# Patient Record
Sex: Female | Born: 1950 | Race: White | Hispanic: No | Smoking: Never smoker
Health system: Southern US, Community
[De-identification: ages and names within clinical notes are randomized; demographics above are authoritative.]

## PROBLEM LIST (undated history)

## (undated) DIAGNOSIS — Z923 Personal history of irradiation: Secondary | ICD-10-CM

## (undated) DIAGNOSIS — E119 Type 2 diabetes mellitus without complications: Secondary | ICD-10-CM

## (undated) DIAGNOSIS — Z9981 Dependence on supplemental oxygen: Secondary | ICD-10-CM

## (undated) DIAGNOSIS — K219 Gastro-esophageal reflux disease without esophagitis: Secondary | ICD-10-CM

## (undated) DIAGNOSIS — Z8669 Personal history of other diseases of the nervous system and sense organs: Secondary | ICD-10-CM

## (undated) DIAGNOSIS — I071 Rheumatic tricuspid insufficiency: Secondary | ICD-10-CM

## (undated) DIAGNOSIS — R296 Repeated falls: Secondary | ICD-10-CM

## (undated) DIAGNOSIS — G894 Chronic pain syndrome: Secondary | ICD-10-CM

## (undated) DIAGNOSIS — M5136 Other intervertebral disc degeneration, lumbar region: Secondary | ICD-10-CM

## (undated) DIAGNOSIS — D126 Benign neoplasm of colon, unspecified: Secondary | ICD-10-CM

## (undated) DIAGNOSIS — M51369 Other intervertebral disc degeneration, lumbar region without mention of lumbar back pain or lower extremity pain: Secondary | ICD-10-CM

## (undated) DIAGNOSIS — I350 Nonrheumatic aortic (valve) stenosis: Secondary | ICD-10-CM

## (undated) DIAGNOSIS — E1343 Other specified diabetes mellitus with diabetic autonomic (poly)neuropathy: Secondary | ICD-10-CM

## (undated) DIAGNOSIS — E785 Hyperlipidemia, unspecified: Secondary | ICD-10-CM

## (undated) DIAGNOSIS — D649 Anemia, unspecified: Secondary | ICD-10-CM

## (undated) DIAGNOSIS — R600 Localized edema: Secondary | ICD-10-CM

## (undated) DIAGNOSIS — F1190 Opioid use, unspecified, uncomplicated: Secondary | ICD-10-CM

## (undated) DIAGNOSIS — I10 Essential (primary) hypertension: Secondary | ICD-10-CM

## (undated) DIAGNOSIS — I209 Angina pectoris, unspecified: Secondary | ICD-10-CM

## (undated) DIAGNOSIS — G4730 Sleep apnea, unspecified: Secondary | ICD-10-CM

## (undated) DIAGNOSIS — G4733 Obstructive sleep apnea (adult) (pediatric): Secondary | ICD-10-CM

## (undated) DIAGNOSIS — M1990 Unspecified osteoarthritis, unspecified site: Secondary | ICD-10-CM

## (undated) DIAGNOSIS — R011 Cardiac murmur, unspecified: Secondary | ICD-10-CM

## (undated) DIAGNOSIS — I340 Nonrheumatic mitral (valve) insufficiency: Secondary | ICD-10-CM

## (undated) HISTORY — PX: CHOLECYSTECTOMY: SHX55

## (undated) HISTORY — PX: ABDOMINAL HYSTERECTOMY: SHX81

## (undated) HISTORY — PX: BREAST BIOPSY: SHX20

## (undated) HISTORY — PX: EYE SURGERY: SHX253

## (undated) HISTORY — PX: TONSILLECTOMY: SUR1361

## (undated) HISTORY — PX: FUNCTIONAL ENDOSCOPIC SINUS SURGERY: SUR616

---

## 1989-12-22 HISTORY — PX: ABDOMINAL HYSTERECTOMY: SHX81

## 2005-11-19 ENCOUNTER — Ambulatory Visit: Payer: Self-pay

## 2007-01-27 ENCOUNTER — Ambulatory Visit: Payer: Self-pay

## 2008-03-04 DIAGNOSIS — E1165 Type 2 diabetes mellitus with hyperglycemia: Secondary | ICD-10-CM | POA: Insufficient documentation

## 2008-03-04 DIAGNOSIS — I10 Essential (primary) hypertension: Secondary | ICD-10-CM | POA: Insufficient documentation

## 2008-05-24 ENCOUNTER — Ambulatory Visit: Payer: Self-pay

## 2008-12-11 ENCOUNTER — Ambulatory Visit: Payer: Self-pay

## 2010-11-12 ENCOUNTER — Ambulatory Visit: Payer: Self-pay | Admitting: Family Medicine

## 2013-02-15 ENCOUNTER — Ambulatory Visit: Payer: Self-pay

## 2016-05-20 ENCOUNTER — Other Ambulatory Visit: Payer: Self-pay | Admitting: Family Medicine

## 2016-05-20 DIAGNOSIS — Z780 Asymptomatic menopausal state: Secondary | ICD-10-CM

## 2016-05-23 ENCOUNTER — Other Ambulatory Visit: Payer: Self-pay | Admitting: Family Medicine

## 2016-05-23 DIAGNOSIS — Z1231 Encounter for screening mammogram for malignant neoplasm of breast: Secondary | ICD-10-CM

## 2016-06-09 DIAGNOSIS — R011 Cardiac murmur, unspecified: Secondary | ICD-10-CM | POA: Insufficient documentation

## 2016-06-26 ENCOUNTER — Ambulatory Visit
Admission: RE | Admit: 2016-06-26 | Discharge: 2016-06-26 | Disposition: A | Discharge status: Home / Self Care | Payer: Medicare HMO | Source: Ambulatory Visit | Attending: Family Medicine | Admitting: Family Medicine

## 2016-06-26 ENCOUNTER — Other Ambulatory Visit: Payer: Self-pay | Admitting: Family Medicine

## 2016-06-26 DIAGNOSIS — Z1321 Encounter for screening for nutritional disorder: Secondary | ICD-10-CM | POA: Insufficient documentation

## 2016-06-26 DIAGNOSIS — Z1231 Encounter for screening mammogram for malignant neoplasm of breast: Secondary | ICD-10-CM

## 2016-06-26 DIAGNOSIS — M8580 Other specified disorders of bone density and structure, unspecified site: Secondary | ICD-10-CM | POA: Diagnosis not present

## 2016-06-26 DIAGNOSIS — Z13820 Encounter for screening for osteoporosis: Secondary | ICD-10-CM | POA: Insufficient documentation

## 2016-06-26 DIAGNOSIS — Z780 Asymptomatic menopausal state: Secondary | ICD-10-CM

## 2016-06-27 ENCOUNTER — Other Ambulatory Visit: Payer: Self-pay | Admitting: Family Medicine

## 2016-06-27 DIAGNOSIS — N6310 Unspecified lump in the right breast, unspecified quadrant: Secondary | ICD-10-CM

## 2016-07-10 ENCOUNTER — Ambulatory Visit
Admission: RE | Admit: 2016-07-10 | Discharge: 2016-07-10 | Disposition: A | Discharge status: Home / Self Care | Payer: Medicare HMO | Source: Ambulatory Visit | Attending: Family Medicine | Admitting: Family Medicine

## 2016-07-10 DIAGNOSIS — N6310 Unspecified lump in the right breast, unspecified quadrant: Secondary | ICD-10-CM

## 2016-07-10 DIAGNOSIS — N63 Unspecified lump in breast: Secondary | ICD-10-CM | POA: Insufficient documentation

## 2016-07-11 ENCOUNTER — Other Ambulatory Visit: Payer: Self-pay | Admitting: Family Medicine

## 2016-07-11 DIAGNOSIS — N6310 Unspecified lump in the right breast, unspecified quadrant: Secondary | ICD-10-CM

## 2016-07-16 ENCOUNTER — Ambulatory Visit
Admission: RE | Admit: 2016-07-16 | Discharge: 2016-07-16 | Disposition: A | Discharge status: Home / Self Care | Payer: Medicare HMO | Source: Ambulatory Visit | Attending: Family Medicine | Admitting: Family Medicine

## 2016-07-16 DIAGNOSIS — N6001 Solitary cyst of right breast: Secondary | ICD-10-CM | POA: Diagnosis not present

## 2016-07-16 DIAGNOSIS — N6310 Unspecified lump in the right breast, unspecified quadrant: Secondary | ICD-10-CM

## 2016-07-16 DIAGNOSIS — N63 Unspecified lump in breast: Secondary | ICD-10-CM | POA: Diagnosis present

## 2016-07-16 HISTORY — PX: BREAST CYST ASPIRATION: SHX578

## 2016-07-29 ENCOUNTER — Encounter: Payer: Self-pay | Admitting: *Deleted

## 2016-07-30 ENCOUNTER — Encounter
Admission: RE | Disposition: A | Discharge status: Home / Self Care | Payer: Self-pay | Source: Ambulatory Visit | Attending: Gastroenterology

## 2016-07-30 ENCOUNTER — Ambulatory Visit
Admission: RE | Admit: 2016-07-30 | Discharge: 2016-07-30 | Disposition: A | Discharge status: Home / Self Care | Payer: Medicare HMO | Source: Ambulatory Visit | Attending: Gastroenterology | Admitting: Gastroenterology

## 2016-07-30 ENCOUNTER — Ambulatory Visit: Payer: Medicare HMO | Admitting: Anesthesiology

## 2016-07-30 ENCOUNTER — Encounter: Payer: Self-pay | Admitting: *Deleted

## 2016-07-30 DIAGNOSIS — Z800 Family history of malignant neoplasm of digestive organs: Secondary | ICD-10-CM | POA: Diagnosis not present

## 2016-07-30 DIAGNOSIS — E785 Hyperlipidemia, unspecified: Secondary | ICD-10-CM | POA: Insufficient documentation

## 2016-07-30 DIAGNOSIS — I10 Essential (primary) hypertension: Secondary | ICD-10-CM | POA: Insufficient documentation

## 2016-07-30 DIAGNOSIS — Z1211 Encounter for screening for malignant neoplasm of colon: Secondary | ICD-10-CM | POA: Insufficient documentation

## 2016-07-30 DIAGNOSIS — E119 Type 2 diabetes mellitus without complications: Secondary | ICD-10-CM | POA: Diagnosis not present

## 2016-07-30 DIAGNOSIS — M1990 Unspecified osteoarthritis, unspecified site: Secondary | ICD-10-CM | POA: Diagnosis not present

## 2016-07-30 DIAGNOSIS — G4730 Sleep apnea, unspecified: Secondary | ICD-10-CM | POA: Insufficient documentation

## 2016-07-30 DIAGNOSIS — K5730 Diverticulosis of large intestine without perforation or abscess without bleeding: Secondary | ICD-10-CM | POA: Diagnosis not present

## 2016-07-30 DIAGNOSIS — Z7984 Long term (current) use of oral hypoglycemic drugs: Secondary | ICD-10-CM | POA: Insufficient documentation

## 2016-07-30 DIAGNOSIS — K219 Gastro-esophageal reflux disease without esophagitis: Secondary | ICD-10-CM | POA: Diagnosis not present

## 2016-07-30 DIAGNOSIS — Z862 Personal history of diseases of the blood and blood-forming organs and certain disorders involving the immune mechanism: Secondary | ICD-10-CM | POA: Diagnosis not present

## 2016-07-30 DIAGNOSIS — D128 Benign neoplasm of rectum: Secondary | ICD-10-CM | POA: Diagnosis not present

## 2016-07-30 DIAGNOSIS — Z79899 Other long term (current) drug therapy: Secondary | ICD-10-CM | POA: Diagnosis not present

## 2016-07-30 HISTORY — DX: Essential (primary) hypertension: I10

## 2016-07-30 HISTORY — DX: Localized edema: R60.0

## 2016-07-30 HISTORY — DX: Gastro-esophageal reflux disease without esophagitis: K21.9

## 2016-07-30 HISTORY — PX: COLONOSCOPY WITH PROPOFOL: SHX5780

## 2016-07-30 HISTORY — DX: Type 2 diabetes mellitus without complications: E11.9

## 2016-07-30 HISTORY — DX: Sleep apnea, unspecified: G47.30

## 2016-07-30 HISTORY — DX: Unspecified osteoarthritis, unspecified site: M19.90

## 2016-07-30 HISTORY — DX: Angina pectoris, unspecified: I20.9

## 2016-07-30 HISTORY — DX: Hyperlipidemia, unspecified: E78.5

## 2016-07-30 HISTORY — DX: Anemia, unspecified: D64.9

## 2016-07-30 LAB — GLUCOSE, CAPILLARY: GLUCOSE-CAPILLARY: 163 mg/dL — AB (ref 65–99)

## 2016-07-30 SURGERY — COLONOSCOPY WITH PROPOFOL
Anesthesia: General

## 2016-07-30 MED ORDER — MIDAZOLAM HCL 2 MG/2ML IJ SOLN
INTRAMUSCULAR | Status: DC | PRN
  Administered 2016-07-30: 4 mg via INTRAVENOUS
  Administered 2016-07-30: 1 mg via INTRAVENOUS

## 2016-07-30 MED ORDER — PROPOFOL 10 MG/ML IV BOLUS
INTRAVENOUS | Status: DC | PRN
  Administered 2016-07-30: 30 mg via INTRAVENOUS

## 2016-07-30 MED ORDER — SODIUM CHLORIDE 0.9 % IV SOLN
INTRAVENOUS | Status: DC
  Administered 2016-07-30: 08:00:00 via INTRAVENOUS

## 2016-07-30 MED ORDER — SODIUM CHLORIDE 0.9 % IV SOLN: INTRAVENOUS | Status: DC

## 2016-07-30 MED ORDER — PROPOFOL 500 MG/50ML IV EMUL
INTRAVENOUS | Status: DC | PRN
  Administered 2016-07-30: 120 ug/kg/min via INTRAVENOUS

## 2016-07-30 MED ORDER — PHENYLEPHRINE HCL 10 MG/ML IJ SOLN
INTRAMUSCULAR | Status: DC | PRN
  Administered 2016-07-30: 100 ug via INTRAVENOUS

## 2016-07-30 MED ORDER — FENTANYL CITRATE (PF) 100 MCG/2ML IJ SOLN
INTRAMUSCULAR | Status: DC | PRN
  Administered 2016-07-30 (×2): 25 ug via INTRAVENOUS

## 2016-07-30 NOTE — Anesthesia Postprocedure Evaluation (Signed)
Anesthesia Post Note  Patient: Rhonda Thornton  Procedure(s) Performed: Procedure(s) (LRB): COLONOSCOPY WITH PROPOFOL (N/A)  Patient location during evaluation: PACU Anesthesia Type: General Level of consciousness: awake Pain management: pain level controlled Vital Signs Assessment: post-procedure vital signs reviewed and stable Respiratory status: nonlabored ventilation Cardiovascular status: stable Anesthetic complications: no    Last Vitals:  Vitals:   07/30/16 0749 07/30/16 0923  BP: 138/89 112/70  Pulse: 94 80  Resp: 16 (!) 22  Temp: 37.5 C 36.3 C    Last Pain:  Vitals:   07/30/16 0923  TempSrc: Tympanic                 VAN STAVEREN,Letina Luckett

## 2016-07-30 NOTE — Anesthesia Preprocedure Evaluation (Signed)
Anesthesia Evaluation  Patient identified by MRN, date of birth, ID band Patient awake    Reviewed: Allergy & Precautions, NPO status , Patient's Chart, lab work & pertinent test results  Airway Mallampati: III       Dental  (+) Missing   Pulmonary sleep apnea ,    breath sounds clear to auscultation       Cardiovascular hypertension, Pt. on medications + angina  Rhythm:Regular     Neuro/Psych    GI/Hepatic GERD  Medicated,  Endo/Other  diabetes, Type 2, Oral Hypoglycemic AgentsMorbid obesity  Renal/GU      Musculoskeletal   Abdominal (+) + obese,   Peds  Hematology  (+) anemia ,   Anesthesia Other Findings   Reproductive/Obstetrics                             Anesthesia Physical Anesthesia Plan  ASA: III  Anesthesia Plan: General   Post-op Pain Management:    Induction: Intravenous  Airway Management Planned: Natural Airway and Nasal Cannula  Additional Equipment:   Intra-op Plan:   Post-operative Plan:   Informed Consent: I have reviewed the patients History and Physical, chart, labs and discussed the procedure including the risks, benefits and alternatives for the proposed anesthesia with the patient or authorized representative who has indicated his/her understanding and acceptance.     Plan Discussed with:   Anesthesia Plan Comments:         Anesthesia Quick Evaluation

## 2016-07-30 NOTE — Transfer of Care (Signed)
Immediate Anesthesia Transfer of Care Note  Patient: Rhonda Thornton  Procedure(s) Performed: Procedure(s): COLONOSCOPY WITH PROPOFOL (N/A)  Patient Location: PACU  Anesthesia Type:General  Level of Consciousness: awake and alert   Airway & Oxygen Therapy: Patient Spontanous Breathing and Patient connected to nasal cannula oxygen  Post-op Assessment: Report given to RN and Post -op Vital signs reviewed and stable  Post vital signs: Reviewed  Last Vitals:  Vitals:   07/30/16 0749  BP: 138/89  Pulse: 94  Resp: 16  Temp: 37.5 C    Last Pain:  Vitals:   07/30/16 0749  TempSrc: Oral         Complications: No apparent anesthesia complications

## 2016-07-30 NOTE — Op Note (Signed)
[facility] Gastroenterology Patient Name: Rhonda Thornton Procedure Date: 07/30/2016 8:39 AM MRN: [id number] Account #: [id number] Date of Birth: 14-Apr-1951 Admit Type: Outpatient Age: 65 Room: [facility] ENDO ROOM 1 Gender: Female Note Status: Finalized Procedure:            Colonoscopy Indications:          Colon cancer screening in patient at increased risk:                        Colorectal cancer in brother Providers:            Lollie Sails, MD Referring MD:         Denton Lank MD, MD (Referring MD) Medicines:            Monitored Anesthesia Care Complications:        No immediate complications. Procedure:            Pre-Anesthesia Assessment:                       - ASA Grade Assessment: III - A patient with severe                        systemic disease.                       After obtaining informed consent, the colonoscope was                        passed under direct vision. Throughout the procedure,                        the patient's blood pressure, pulse, and oxygen                        saturations were monitored continuously. The                        Colonoscope was introduced through the anus and                        advanced to the the cecum, identified by appendiceal                        orifice and ileocecal valve. The colonoscopy was                        performed with moderate difficulty due to poor bowel                        prep. Successful completion of the procedure was aided                        by lavage. Findings:      A 2 mm polyp was found in the rectum. The polyp was flat. The polyp was       removed with a cold biopsy forceps. Resection and retrieval were       complete.      Multiple medium-mouthed diverticula were found in the sigmoid colon and       descending colon.      The digital rectal exam was normal.  The retroflexed view of the distal rectum and anal verge was normal and       showed no anal or rectal  abnormalities. Impression:           - One 2 mm polyp in the rectum, removed with a cold                        biopsy forceps. Resected and retrieved.                       - Diverticulosis in the sigmoid colon and in the                        descending colon.                       - The distal rectum and anal verge are normal on                        retroflexion view. Recommendation:       - Discharge patient to home.                       - Resume regular diet.                       - Telephone GI clinic for pathology results in 1 week. Procedure Code(s):    --- Professional ---                       [phone], Colonoscopy, flexible; with biopsy, single or                        multiple Diagnosis Code(s):    --- Professional ---                       Z80.0, Family history of malignant neoplasm of                        digestive organs                       K62.1, Rectal polyp                       K57.30, Diverticulosis of large intestine without                        perforation or abscess without bleeding CPT copyright 2016 [employer] Association. All rights reserved. The codes documented in this report are preliminary and upon coder review may  be revised to meet current compliance requirements. Lollie Sails, MD 07/30/2016 9:23:04 AM This report has been signed electronically. Number of Addenda: 0 Note Initiated On: 07/30/2016 8:39 AM Scope Withdrawal Time: 0 hours 8 minutes 53 seconds  Total Procedure Duration: 0 hours 20 minutes 56 seconds       [facility]

## 2016-07-30 NOTE — H&P (Signed)
Outpatient short stay form Pre-procedure 07/30/2016 8:48 AM Lollie Sails MD  Primary Physician: Dr. Veda Canning  Reason for visit:  Screening colonoscopy  History of present illness:  Patient is a 65 year old female presenting today as above. She has a family history of colon cancer in primary relatives this being a high risk procedure screening for her. Patient reports being on 2 325 mg aspirin daily. She last took this about 5 days ago. She could not tell me why she was on that high dose of aspirin and I have requested that she discuss this further with her primary doctor. She tolerated her prep well.    Current Facility-Administered Medications:  .  0.9 %  sodium chloride infusion, , Intravenous, Continuous, Manya Silvas, MD, Last Rate: 20 mL/hr at 07/30/16 0809 .  0.9 %  sodium chloride infusion, , Intravenous, Continuous, Lollie Sails, MD  Prescriptions Prior to Admission  Medication Sig Dispense Refill Last Dose  . alum & mag hydroxide-simeth (MAALOX/MYLANTA) 200-200-20 MG/5ML suspension Take 15 mLs by mouth as needed for indigestion or heartburn.     Marland Kitchen amLODipine (NORVASC) 5 MG tablet Take 5 mg by mouth daily.   07/29/2016 at Unknown time  . aspirin 325 MG EC tablet Take 325 mg by mouth 2 (two) times daily.   07/25/2016  . atorvastatin (LIPITOR) 80 MG tablet Take 80 mg by mouth daily.     . beclomethasone (BECONASE-AQ) 42 MCG/SPRAY nasal spray Place 2 sprays into both nostrils 2 (two) times daily. Dose is for each nostril.   07/30/2016 at 0600  . calcium carbonate (TUMS - DOSED IN MG ELEMENTAL CALCIUM) 500 MG chewable tablet Chew 1 tablet by mouth as needed for indigestion or heartburn.   Past Week at Unknown time  . cetirizine (ZYRTEC) 10 MG tablet Take 10 mg by mouth daily.   07/29/2016 at Unknown time  . cyclobenzaprine (FLEXERIL) 10 MG tablet Take 10 mg by mouth 3 (three) times daily as needed for muscle spasms.   07/29/2016 at Unknown time  . glipiZIDE (GLUCOTROL) 5 MG tablet  Take by mouth daily before breakfast.     . HYDROcodone-acetaminophen (NORCO) 7.5-325 MG tablet Take 1 tablet by mouth 2 (two) times daily.   07/29/2016 at Unknown time  . ibuprofen (ADVIL,MOTRIN) 200 MG tablet Take 200 mg by mouth every 8 (eight) hours as needed.   Past Week at Unknown time  . lisinopril (PRINIVIL,ZESTRIL) 5 MG tablet Take 5 mg by mouth daily.   07/29/2016 at Unknown time  . metFORMIN (GLUCOPHAGE) 500 MG tablet Take 500 mg by mouth 4 (four) times daily.     . metoCLOPramide (REGLAN) 10 MG tablet Take 10 mg by mouth 3 (three) times daily before meals.   07/29/2016 at Unknown time  . omeprazole (PRILOSEC) 40 MG capsule Take 40 mg by mouth daily.   07/29/2016 at Unknown time  . phenytoin (DILANTIN) 100 MG ER capsule Take 300 mg by mouth 3 (three) times daily.   07/29/2016 at Unknown time  . pregabalin (LYRICA) 100 MG capsule Take 100 mg by mouth 3 (three) times daily.   07/29/2016 at Unknown time  . sertraline (ZOLOFT) 100 MG tablet Take 100 mg by mouth daily. Take 1 1/2 tablet a day   07/29/2016 at Unknown time     No Known Allergies   Past Medical History:  Diagnosis Date  . Anemia   . Anginal pain ([place])   . Arthritis   . Diabetes mellitus without complication (  [place])   . Edema of left lower extremity   . GERD (gastroesophageal reflux disease)   . Hyperlipidemia   . Hypertension   . Sleep apnea     Review of systems:      Physical Exam    Heart and lungs: Regular rate and rhythm without rub or gallop, lungs are bilaterally clear.    HEENT: Normocephalic atraumatic eyes are anicteric    Other:     Pertinant exam for procedure: Soft nontender nondistended bowel sounds positive normoactive.    Planned proceedures: Colonoscopy and indicated procedures. I have discussed the risks benefits and complications of procedures to include not limited to bleeding, infection, perforation and the risk of sedation and the patient wishes to proceed.    Lollie Sails,  MD Gastroenterology 07/30/2016  8:48 AM

## 2016-07-31 ENCOUNTER — Encounter: Payer: Self-pay | Admitting: Gastroenterology

## 2016-07-31 LAB — SURGICAL PATHOLOGY

## 2017-08-12 ENCOUNTER — Other Ambulatory Visit: Payer: Self-pay | Admitting: Family Medicine

## 2017-08-12 DIAGNOSIS — K219 Gastro-esophageal reflux disease without esophagitis: Secondary | ICD-10-CM

## 2017-08-21 ENCOUNTER — Ambulatory Visit
Admission: RE | Admit: 2017-08-21 | Discharge: 2017-08-21 | Disposition: A | Discharge status: Home / Self Care | Payer: Medicare HMO | Source: Ambulatory Visit | Attending: Family Medicine | Admitting: Family Medicine

## 2017-08-21 DIAGNOSIS — K219 Gastro-esophageal reflux disease without esophagitis: Secondary | ICD-10-CM | POA: Diagnosis not present

## 2017-08-23 ENCOUNTER — Emergency Department
Admission: EM | Admit: 2017-08-23 | Discharge: 2017-08-23 | Disposition: A | Discharge status: Home / Self Care | Payer: Medicare HMO | Attending: Emergency Medicine | Admitting: Emergency Medicine

## 2017-08-23 ENCOUNTER — Encounter: Payer: Self-pay | Admitting: Emergency Medicine

## 2017-08-23 ENCOUNTER — Emergency Department: Payer: Medicare HMO

## 2017-08-23 DIAGNOSIS — E119 Type 2 diabetes mellitus without complications: Secondary | ICD-10-CM | POA: Insufficient documentation

## 2017-08-23 DIAGNOSIS — Z7982 Long term (current) use of aspirin: Secondary | ICD-10-CM | POA: Diagnosis not present

## 2017-08-23 DIAGNOSIS — Z7984 Long term (current) use of oral hypoglycemic drugs: Secondary | ICD-10-CM | POA: Insufficient documentation

## 2017-08-23 DIAGNOSIS — I10 Essential (primary) hypertension: Secondary | ICD-10-CM | POA: Insufficient documentation

## 2017-08-23 DIAGNOSIS — Z79899 Other long term (current) drug therapy: Secondary | ICD-10-CM | POA: Insufficient documentation

## 2017-08-23 DIAGNOSIS — Z79891 Long term (current) use of opiate analgesic: Secondary | ICD-10-CM | POA: Diagnosis not present

## 2017-08-23 DIAGNOSIS — R079 Chest pain, unspecified: Secondary | ICD-10-CM | POA: Insufficient documentation

## 2017-08-23 LAB — BASIC METABOLIC PANEL
ANION GAP: 8 (ref 5–15)
BUN: 18 mg/dL (ref 6–20)
CALCIUM: 9.2 mg/dL (ref 8.9–10.3)
CO2: 27 mmol/L (ref 22–32)
CREATININE: 0.54 mg/dL (ref 0.44–1.00)
Chloride: 102 mmol/L (ref 101–111)
Glucose, Bld: 162 mg/dL — ABNORMAL HIGH (ref 65–99)
Potassium: 3.7 mmol/L (ref 3.5–5.1)
SODIUM: 137 mmol/L (ref 135–145)

## 2017-08-23 LAB — HEPATIC FUNCTION PANEL
ALBUMIN: 4.2 g/dL (ref 3.5–5.0)
ALT: 19 U/L (ref 14–54)
AST: 23 U/L (ref 15–41)
Alkaline Phosphatase: 58 U/L (ref 38–126)
BILIRUBIN TOTAL: 0.5 mg/dL (ref 0.3–1.2)
Bilirubin, Direct: <0.1 mg/dL — ABNORMAL LOW (ref 0.1–0.5)
Total Protein: 7.4 g/dL (ref 6.5–8.1)

## 2017-08-23 LAB — CBC
HCT: 34.1 % — ABNORMAL LOW (ref 35.0–47.0)
HEMOGLOBIN: 11.6 g/dL — AB (ref 12.0–16.0)
MCH: 29.3 pg (ref 26.0–34.0)
MCHC: 34.1 g/dL (ref 32.0–36.0)
MCV: 86.1 fL (ref 80.0–100.0)
Platelets: 200 10*3/uL (ref 150–440)
RBC: 3.96 MIL/uL (ref 3.80–5.20)
RDW: 15.8 % — ABNORMAL HIGH (ref 11.5–14.5)
WBC: 6.5 10*3/uL (ref 3.6–11.0)

## 2017-08-23 LAB — TROPONIN I

## 2017-08-23 LAB — LIPASE, BLOOD: Lipase: 18 U/L (ref 11–51)

## 2017-08-23 MED ORDER — PANTOPRAZOLE SODIUM 40 MG PO TBEC: 40.0000 mg | DELAYED_RELEASE_TABLET | Freq: Every day | ORAL | 1 refills | Status: DC

## 2017-08-23 MED ORDER — GI COCKTAIL ~~LOC~~
30.0000 mL | Freq: Once | ORAL | Status: AC
  Administered 2017-08-23: 30 mL via ORAL
  Filled 2017-08-23: qty 30

## 2017-08-23 MED ORDER — IOPAMIDOL (ISOVUE-370) INJECTION 76%
75.0000 mL | Freq: Once | INTRAVENOUS | Status: AC | PRN
  Administered 2017-08-23: 75 mL via INTRAVENOUS
  Filled 2017-08-23: qty 75

## 2017-08-23 NOTE — Discharge Instructions (Signed)
You have been seen in the emergency department today for chest pain. Your workup has shown largely normal results. As we discussed please follow-up with your primary care physician in the next 1-2 days for recheck. Please call the numbers provided for GI and cardiology follow up as well as soon as possible.  Return to the emergency department for any further chest pain, trouble breathing, or any other symptom personally concerning to yourself.

## 2017-08-23 NOTE — ED Notes (Signed)
FIRST NURSE NOTE:  Chest pain for the past week. Pt came from [facility], states worsening chest pain since last night.

## 2017-08-23 NOTE — ED Provider Notes (Addendum)
[facility] Emergency Department Provider Note  Time seen: 2:30 PM  I have reviewed the triage vital signs and the nursing notes.   HISTORY  Chief Complaint Chest Pain    HPI Rhonda Thornton is a 66 y.o. female With a past medical history of arthritis, diabetes, gastric reflux, hypertension, hyperlipidemia who presents to the emergency department with chest discomfort. According to the patient for the past several weeks but worse over the past one week she has been expressing chest pressure/indigestion sensation after eating. She states the pain typically lasts up to several hours and then goes away. Over the past 2 days she states the pain is been fairly constant throughout the day. She also states mild shortness of breath which is slightly increased compared to normal. Denies any fever. Denies any cough or congestion. Patient states she has noticed mild leg swelling bilaterally but states this is somewhat chronic for her as well. Patient does were oxygen but states only at nighttime. Patient states mild chest discomfort currently in the center of her chest. Patient states she has been taking over-the-counter indigestion medications without relief so she came to the emergency department.  Past Medical History:  Diagnosis Date  . Anemia   . Anginal pain ([place])   . Arthritis   . Diabetes mellitus without complication ([place])   . Edema of left lower extremity   . GERD (gastroesophageal reflux disease)   . Hyperlipidemia   . Hypertension   . Sleep apnea     There are no active problems to display for this patient.   Past Surgical History:  Procedure Laterality Date  . ABDOMINAL HYSTERECTOMY    . CHOLECYSTECTOMY    . COLONOSCOPY WITH PROPOFOL N/A 07/30/2016   Procedure: COLONOSCOPY WITH PROPOFOL;  Surgeon: Lollie Sails, MD;  Location: [facility] ENDOSCOPY;  Service: Endoscopy;  Laterality: N/A;  . FUNCTIONAL ENDOSCOPIC SINUS SURGERY    . TONSILLECTOMY      Prior  to Admission medications   Medication Sig Start Date End Date Taking? Authorizing Provider  alum & mag hydroxide-simeth (MAALOX/MYLANTA) 200-200-20 MG/5ML suspension Take 15 mLs by mouth as needed for indigestion or heartburn.    [provider]  amLODipine (NORVASC) 5 MG tablet Take 5 mg by mouth daily.    [provider]  aspirin 325 MG EC tablet Take 325 mg by mouth 2 (two) times daily.    [provider]  atorvastatin (LIPITOR) 80 MG tablet Take 80 mg by mouth daily.    [provider]  beclomethasone (BECONASE-AQ) 42 MCG/SPRAY nasal spray Place 2 sprays into both nostrils 2 (two) times daily. Dose is for each nostril.    [provider]  calcium carbonate (TUMS - DOSED IN MG ELEMENTAL CALCIUM) 500 MG chewable tablet Chew 1 tablet by mouth as needed for indigestion or heartburn.    [provider]  cetirizine (ZYRTEC) 10 MG tablet Take 10 mg by mouth daily.    [provider]  cyclobenzaprine (FLEXERIL) 10 MG tablet Take 10 mg by mouth 3 (three) times daily as needed for muscle spasms.    [provider]  glipiZIDE (GLUCOTROL) 5 MG tablet Take by mouth daily before breakfast.    [provider]  HYDROcodone-acetaminophen (NORCO) 7.5-325 MG tablet Take 1 tablet by mouth 2 (two) times daily.    [provider]  ibuprofen (ADVIL,MOTRIN) 200 MG tablet Take 200 mg by mouth every 8 (eight) hours as needed.    [provider]  lisinopril (PRINIVIL,ZESTRIL) 5 MG tablet Take 5 mg by mouth daily.    [provider]  metFORMIN (GLUCOPHAGE) 500 MG tablet Take 500 mg by mouth 4 (four) times daily.    [provider]  metoCLOPramide (REGLAN) 10 MG tablet Take 10 mg by mouth 3 (three) times daily before meals.    [provider]  omeprazole (PRILOSEC) 40 MG capsule Take 40 mg by mouth daily.    [provider]  phenytoin (DILANTIN) 100 MG ER capsule Take 300 mg by mouth 3  (three) times daily.    [provider]  pregabalin (LYRICA) 100 MG capsule Take 100 mg by mouth 3 (three) times daily.    [provider]  sertraline (ZOLOFT) 100 MG tablet Take 100 mg by mouth daily. Take 1 1/2 tablet a day    [provider]    No Known Allergies  Family History  Problem Relation Age of Onset  . Breast cancer Neg Hx     Social History Social History  Substance Use Topics  . Smoking status: Never Smoker  . Smokeless tobacco: Never Used  . Alcohol use No    Review of Systems Constitutional: Negative for fever. Cardiovascular: chest discomfort/indigestion after eating Respiratory: mild shortness of breath. Gastrointestinal: Negative for abdominal pain. Denies vomiting or diarrhea. Genitourinary: Negative for dysuria. Musculoskeletal: Negative for back pain. Neurological: Negative for headache All other ROS negative  ____________________________________________   PHYSICAL EXAM:  VITAL SIGNS: ED Triage Vitals  Enc Vitals Group     BP 08/23/17 1334 (!) 130/59     Pulse Rate 08/23/17 1334 70     Resp 08/23/17 1334 18     Temp 08/23/17 1334 99.3 F (37.4 C)     Temp Source 08/23/17 1334 Oral     SpO2 08/23/17 1334 96 %     Weight 08/23/17 1335 242 lb (109.8 kg)     Height 08/23/17 1335 5\' 4"  (1.626 m)     Head Circumference --      Peak Flow --      Pain Score 08/23/17 1333 10     Pain Loc --      Pain Edu? --      Excl. in [place]? --     Constitutional: Alert and oriented. Well appearing and in no distress. Eyes: Normal exam ENT   Head: Normocephalic and atraumatic   Mouth/Throat: Mucous membranes are moist. Cardiovascular: Normal rate, regular rhythm. No murmur Respiratory: Normal respiratory effort without tachypnea nor retractions. Breath sounds are clear Gastrointestinal: Soft and nontender. No distention. obese. Musculoskeletal: Nontender with normal range of motion in all extremities. mild lower extremity  edema, equal bilaterally. No calf tenderness. Neurologic:  Normal speech and language. No gross focal neurologic deficits Skin:  Skin is warm, dry and intact.  Psychiatric: Mood and affect are normal.  ____________________________________________    EKG  EKG reviewed and interpreted by myself shows normal sinus rhythm at 74 bpm, narrow QRS, normal axis, normal intervals and no concerning ST changes.  ____________________________________________    RADIOLOGY  IMPRESSION: 1. Mild bibasilar linear atelectasis or scarring. 2. Mild changes of COPD and chronic bronchitis.  ____________________________________________   INITIAL IMPRESSION / ASSESSMENT AND PLAN / ED COURSE  Pertinent labs & imaging results that were available during my care of the patient were reviewed by me and considered in my medical decision making (see chart for details).  patient presents to the emergency department for several weeks of chest discomfort worse over  the past 1 week. Patient states the chest discomfort only occurs after eating and believes it feels most like indigestion although it has not responded to over-the-counter medications. Patient's EKG is reassuring, chest x-ray is largely negative. Labs are normal including a negative troponin. Given the patient's description of the pain especially after eating have added on a hepatic function panel although the patient is status post cholecystectomy as well as a lipase. We will treat with a GI cocktail in the emergency department and monitor for improvement. If not improved further workup may be warranted.  patient states moderate relief after GI cocktail but continues to have some chest discomfort. Low-grade fever 99.3 with complaints of mild shortness of breath. We will obtain CT angiography of the chest to rule out pulmonary embolus.  CT scan of the chest is negative. We will discharge the patient home with PCP follow-up in my normal chest pain return  precautions.I will also have the patient follow-up with her cardiologist as well as GI medicine. Patient states he had a stress test performed less than one year ago by [facility].  I discussed dietary precautions as well as avoiding NSAIDs products.  ____________________________________________   FINAL CLINICAL IMPRESSION(S) / ED DIAGNOSES  chest pain    Harvest Dark, MD 08/23/17 1716    Harvest Dark, MD 08/23/17 1324

## 2017-08-23 NOTE — ED Notes (Signed)
Pt able to ambulate to the restroom with her walker. Baseline for pt

## 2017-08-23 NOTE — ED Triage Notes (Signed)
Pt arrives from [facility] Pt c/o chest pressure and a burning sensation that worsens after eating x 1 week. Pt also states that both legs have been swelling lately. Pt c/o shortness of breath that comes and goes. Pt states she is on oxygen at home at night d/t sleep apnea - current sats are 96% on room air.

## 2017-08-31 ENCOUNTER — Other Ambulatory Visit
Admission: RE | Admit: 2017-08-31 | Discharge: 2017-08-31 | Disposition: A | Discharge status: Home / Self Care | Payer: Medicare HMO | Source: Ambulatory Visit | Attending: Gastroenterology | Admitting: Gastroenterology

## 2017-08-31 DIAGNOSIS — R1310 Dysphagia, unspecified: Secondary | ICD-10-CM | POA: Insufficient documentation

## 2017-08-31 DIAGNOSIS — R197 Diarrhea, unspecified: Secondary | ICD-10-CM | POA: Insufficient documentation

## 2017-08-31 LAB — GASTROINTESTINAL PANEL BY PCR, STOOL (REPLACES STOOL CULTURE)

## 2017-08-31 LAB — C DIFFICILE QUICK SCREEN W PCR REFLEX
C DIFFICILE (CDIFF) INTERP: NOT DETECTED
C Diff antigen: NEGATIVE
C Diff toxin: NEGATIVE

## 2017-09-09 ENCOUNTER — Ambulatory Visit: Payer: Medicare HMO | Admitting: Anesthesiology

## 2017-09-09 ENCOUNTER — Ambulatory Visit
Admission: RE | Admit: 2017-09-09 | Discharge: 2017-09-09 | Disposition: A | Discharge status: Home / Self Care | Payer: Medicare HMO | Source: Ambulatory Visit | Attending: Internal Medicine | Admitting: Internal Medicine

## 2017-09-09 ENCOUNTER — Encounter
Admission: RE | Disposition: A | Discharge status: Home / Self Care | Payer: Self-pay | Source: Ambulatory Visit | Attending: Internal Medicine

## 2017-09-09 DIAGNOSIS — Z7984 Long term (current) use of oral hypoglycemic drugs: Secondary | ICD-10-CM | POA: Insufficient documentation

## 2017-09-09 DIAGNOSIS — K3184 Gastroparesis: Secondary | ICD-10-CM | POA: Insufficient documentation

## 2017-09-09 DIAGNOSIS — Z9889 Other specified postprocedural states: Secondary | ICD-10-CM | POA: Diagnosis not present

## 2017-09-09 DIAGNOSIS — K219 Gastro-esophageal reflux disease without esophagitis: Secondary | ICD-10-CM | POA: Diagnosis not present

## 2017-09-09 DIAGNOSIS — M1990 Unspecified osteoarthritis, unspecified site: Secondary | ICD-10-CM | POA: Diagnosis not present

## 2017-09-09 DIAGNOSIS — E1143 Type 2 diabetes mellitus with diabetic autonomic (poly)neuropathy: Secondary | ICD-10-CM | POA: Diagnosis not present

## 2017-09-09 DIAGNOSIS — Z86010 Personal history of colonic polyps: Secondary | ICD-10-CM | POA: Insufficient documentation

## 2017-09-09 DIAGNOSIS — I10 Essential (primary) hypertension: Secondary | ICD-10-CM | POA: Diagnosis not present

## 2017-09-09 DIAGNOSIS — Z538 Procedure and treatment not carried out for other reasons: Secondary | ICD-10-CM | POA: Insufficient documentation

## 2017-09-09 DIAGNOSIS — Z79899 Other long term (current) drug therapy: Secondary | ICD-10-CM | POA: Insufficient documentation

## 2017-09-09 DIAGNOSIS — E785 Hyperlipidemia, unspecified: Secondary | ICD-10-CM | POA: Insufficient documentation

## 2017-09-09 DIAGNOSIS — Z7982 Long term (current) use of aspirin: Secondary | ICD-10-CM | POA: Diagnosis not present

## 2017-09-09 DIAGNOSIS — R1314 Dysphagia, pharyngoesophageal phase: Secondary | ICD-10-CM | POA: Diagnosis present

## 2017-09-09 DIAGNOSIS — G4730 Sleep apnea, unspecified: Secondary | ICD-10-CM | POA: Insufficient documentation

## 2017-09-09 DIAGNOSIS — K222 Esophageal obstruction: Secondary | ICD-10-CM | POA: Diagnosis not present

## 2017-09-09 HISTORY — PX: ESOPHAGOGASTRODUODENOSCOPY (EGD) WITH PROPOFOL: SHX5813

## 2017-09-09 HISTORY — DX: Benign neoplasm of colon, unspecified: D12.6

## 2017-09-09 LAB — GLUCOSE, CAPILLARY: Glucose-Capillary: 145 mg/dL — ABNORMAL HIGH (ref 65–99)

## 2017-09-09 SURGERY — ESOPHAGOGASTRODUODENOSCOPY (EGD) WITH PROPOFOL
Anesthesia: General

## 2017-09-09 MED ORDER — EPHEDRINE SULFATE 50 MG/ML IJ SOLN
INTRAMUSCULAR | Status: AC
  Filled 2017-09-09: qty 1

## 2017-09-09 MED ORDER — MIDAZOLAM HCL 2 MG/2ML IJ SOLN
INTRAMUSCULAR | Status: DC | PRN
  Administered 2017-09-09: 2 mg via INTRAVENOUS

## 2017-09-09 MED ORDER — PROPOFOL 10 MG/ML IV BOLUS
INTRAVENOUS | Status: AC
  Filled 2017-09-09: qty 40

## 2017-09-09 MED ORDER — GLYCOPYRROLATE 0.2 MG/ML IJ SOLN
INTRAMUSCULAR | Status: AC
  Filled 2017-09-09: qty 2

## 2017-09-09 MED ORDER — PROPOFOL 10 MG/ML IV BOLUS
INTRAVENOUS | Status: DC | PRN
  Administered 2017-09-09: 60 mg via INTRAVENOUS

## 2017-09-09 MED ORDER — PHENYLEPHRINE HCL 10 MG/ML IJ SOLN
INTRAMUSCULAR | Status: AC
  Filled 2017-09-09: qty 1

## 2017-09-09 MED ORDER — MIDAZOLAM HCL 2 MG/2ML IJ SOLN
INTRAMUSCULAR | Status: AC
  Filled 2017-09-09: qty 2

## 2017-09-09 MED ORDER — PROPOFOL 500 MG/50ML IV EMUL
INTRAVENOUS | Status: DC | PRN
  Administered 2017-09-09: 150 ug/kg/min via INTRAVENOUS

## 2017-09-09 MED ORDER — SUCCINYLCHOLINE CHLORIDE 20 MG/ML IJ SOLN
INTRAMUSCULAR | Status: AC
  Filled 2017-09-09: qty 1

## 2017-09-09 MED ORDER — GLYCOPYRROLATE 0.2 MG/ML IJ SOLN
INTRAMUSCULAR | Status: DC | PRN
  Administered 2017-09-09: 0.2 mg via INTRAVENOUS

## 2017-09-09 MED ORDER — LIDOCAINE HCL (PF) 2 % IJ SOLN
INTRAMUSCULAR | Status: AC
  Filled 2017-09-09: qty 4

## 2017-09-09 MED ORDER — SODIUM CHLORIDE 0.9 % IV SOLN
INTRAVENOUS | Status: DC
  Administered 2017-09-09: 1000 mL via INTRAVENOUS

## 2017-09-09 NOTE — Transfer of Care (Signed)
Immediate Anesthesia Transfer of Care Note  Patient: Rhonda Thornton  Procedure(s) Performed: Procedure(s): ESOPHAGOGASTRODUODENOSCOPY (EGD) WITH PROPOFOL (N/A)  Patient Location: PACU and Endoscopy Unit  Anesthesia Type:General  Level of Consciousness: sedated  Airway & Oxygen Therapy: Patient Spontanous Breathing and Patient connected to nasal cannula oxygen  Post-op Assessment: Report given to RN and Post -op Vital signs reviewed and stable  Post vital signs: Reviewed and stable  Last Vitals:  Vitals:   09/09/17 0656 09/09/17 0805  BP: (!) 141/63 109/61  Pulse: 81 85  Resp: 20 20  Temp: (!) 36.4 C (!) 36.4 C  SpO2: 28% 36%    Complications: No apparent anesthesia complications

## 2017-09-09 NOTE — Anesthesia Post-op Follow-up Note (Signed)
Anesthesia QCDR form completed.        

## 2017-09-09 NOTE — Interval H&P Note (Signed)
History and Physical Interval Note:  09/09/2017 7:51 AM  Rhonda Thornton  has presented today for surgery, with the diagnosis of ESOPHAGEAL STENOSIS  The various methods of treatment have been discussed with the patient and family. After consideration of risks, benefits and other options for treatment, the patient has consented to  Procedure(s): ESOPHAGOGASTRODUODENOSCOPY (EGD) WITH PROPOFOL (N/A) as a surgical intervention .  The patient's history has been reviewed, patient examined, no change in status, stable for surgery.  I have reviewed the patient's chart and labs.  Questions were answered to the patient's satisfaction.     [place], [employer]

## 2017-09-09 NOTE — Anesthesia Procedure Notes (Signed)
Date/Time: 09/09/2017 7:51 AM Performed by: Doreen Salvage Pre-anesthesia Checklist: Patient identified, Emergency Drugs available, Suction available and Patient being monitored Patient Re-evaluated:Patient Re-evaluated prior to induction Oxygen Delivery Method: Nasal cannula Induction Type: IV induction Dental Injury: Teeth and Oropharynx as per pre-operative assessment  Comments: Nasal cannula with etCO2 monitoring

## 2017-09-09 NOTE — Anesthesia Preprocedure Evaluation (Signed)
Anesthesia Evaluation  Patient identified by MRN, date of birth, ID band Patient awake    Reviewed: Allergy & Precautions, H&P , NPO status , Patient's Chart, lab work & pertinent test results, reviewed documented beta blocker date and time   Airway Mallampati: II   Neck ROM: full    Dental  (+) Poor Dentition   Pulmonary neg pulmonary ROS, sleep apnea ,    Pulmonary exam normal        Cardiovascular Exercise Tolerance: Poor hypertension, Pt. on medications + angina with exertion negative cardio ROS Normal cardiovascular exam Rhythm:regular Rate:Normal     Neuro/Psych negative neurological ROS  negative psych ROS   GI/Hepatic negative GI ROS, Neg liver ROS, GERD  Medicated,  Endo/Other  negative endocrine ROSdiabetes, Poorly Controlled, Type 2, Oral Hypoglycemic Agents  Renal/GU negative Renal ROS  negative genitourinary   Musculoskeletal  (+) Arthritis ,   Abdominal   Peds  Hematology negative hematology ROS (+) anemia ,   Anesthesia Other Findings Past Medical History: No date: Anemia No date: Anginal pain ([facility]) No date: Arthritis No date: Diabetes mellitus without complication ([facility]) No date: Edema of left lower extremity No date: GERD (gastroesophageal reflux disease) No date: Hyperlipidemia No date: Hypertension No date: Sleep apnea No date: Tubular adenoma of colon Past Surgical History: No date: ABDOMINAL HYSTERECTOMY No date: CHOLECYSTECTOMY 07/30/2016: COLONOSCOPY WITH PROPOFOL; N/A     Comment:  Procedure: COLONOSCOPY WITH PROPOFOL;  Surgeon: Lollie Sails, MD;  Location: [facility] ENDOSCOPY;  Service:               Endoscopy;  Laterality: N/A; No date: EYE SURGERY No date: FUNCTIONAL ENDOSCOPIC SINUS SURGERY No date: TONSILLECTOMY BMI    Body Mass Index:  39.44 kg/m     Reproductive/Obstetrics negative OB ROS                             Anesthesia  Physical Anesthesia Plan  ASA: III  Anesthesia Plan: General   Post-op Pain Management:    Induction:   PONV Risk Score and Plan: 3 and Ondansetron, Dexamethasone, Midazolam and Propofol infusion  Airway Management Planned:   Additional Equipment:   Intra-op Plan:   Post-operative Plan:   Informed Consent: I have reviewed the patients History and Physical, chart, labs and discussed the procedure including the risks, benefits and alternatives for the proposed anesthesia with the patient or authorized representative who has indicated his/her understanding and acceptance.   Dental Advisory Given  Plan Discussed with: CRNA  Anesthesia Plan Comments:         Anesthesia Quick Evaluation

## 2017-09-09 NOTE — H&P (Signed)
Outpatient short stay form Pre-procedure 09/09/2017 7:45 AM Candice Tobey K. Alice Reichert, M.D.  Primary Physician: N/A  Reason for visit:  Dysphagia  History of present illness:  66 y/o female presents for intermittent esophageal dysphagia. No nausea, vomiting or hemetemesis. Patient had pre-op barium study showing significant distal esophageal narrowing. Patient is scheduled for EGD today.    Current Facility-Administered Medications:  .  0.9 %  sodium chloride infusion, , Intravenous, Continuous, [place], Benay Pike, MD, Last Rate: 20 mL/hr at 09/09/17 0725, 1,000 mL at 09/09/17 0725  Prescriptions Prior to Admission  Medication Sig Dispense Refill Last Dose  . alum & mag hydroxide-simeth (MAALOX/MYLANTA) 200-200-20 MG/5ML suspension Take 15 mLs by mouth as needed for indigestion or heartburn.   09/08/2017 at Unknown time  . amLODipine (NORVASC) 5 MG tablet Take 5 mg by mouth daily.   09/08/2017 at Unknown time  . aspirin 325 MG EC tablet Take 325 mg by mouth 2 (two) times daily.   Past Week at Unknown time  . atorvastatin (LIPITOR) 80 MG tablet Take 80 mg by mouth daily.   09/08/2017 at Unknown time  . beclomethasone (BECONASE-AQ) 42 MCG/SPRAY nasal spray Place 2 sprays into both nostrils 2 (two) times daily. Dose is for each nostril.   09/08/2017 at Unknown time  . calcium carbonate (TUMS - DOSED IN MG ELEMENTAL CALCIUM) 500 MG chewable tablet Chew 1 tablet by mouth as needed for indigestion or heartburn.   09/08/2017 at Unknown time  . cetirizine (ZYRTEC) 10 MG tablet Take 10 mg by mouth daily.   09/08/2017 at Unknown time  . cyclobenzaprine (FLEXERIL) 10 MG tablet Take 10 mg by mouth 3 (three) times daily as needed for muscle spasms.   09/08/2017 at Unknown time  . glipiZIDE (GLUCOTROL) 5 MG tablet Take by mouth daily before breakfast.   09/08/2017 at Unknown time  . HYDROcodone-acetaminophen (NORCO) 7.5-325 MG tablet Take 1 tablet by mouth 2 (two) times daily.   09/08/2017 at Unknown time  . ibuprofen  (ADVIL,MOTRIN) 200 MG tablet Take 200 mg by mouth every 8 (eight) hours as needed.   09/08/2017 at Unknown time  . lisinopril (PRINIVIL,ZESTRIL) 10 MG tablet Take 10 mg by mouth daily.   09/08/2017 at Unknown time  . lisinopril (PRINIVIL,ZESTRIL) 5 MG tablet Take 5 mg by mouth daily.   09/08/2017 at Unknown time  . metFORMIN (GLUCOPHAGE) 500 MG tablet Take 500 mg by mouth 4 (four) times daily.   09/08/2017 at Unknown time  . metoCLOPramide (REGLAN) 10 MG tablet Take 10 mg by mouth 3 (three) times daily before meals.   09/08/2017 at Unknown time  . pantoprazole (PROTONIX) 40 MG tablet Take 1 tablet (40 mg total) by mouth daily. 30 tablet 1 09/08/2017 at Unknown time  . phenytoin (DILANTIN) 100 MG ER capsule Take 300 mg by mouth 3 (three) times daily.   09/08/2017 at Unknown time  . pregabalin (LYRICA) 100 MG capsule Take 100 mg by mouth 3 (three) times daily.   09/08/2017 at Unknown time  . sertraline (ZOLOFT) 100 MG tablet Take 100 mg by mouth daily. Take 1 1/2 tablet a day   09/08/2017 at Unknown time     No Known Allergies   Past Medical History:  Diagnosis Date  . Anemia   . Anginal pain ([place])   . Arthritis   . Diabetes mellitus without complication ([place])   . Edema of left lower extremity   . GERD (gastroesophageal reflux disease)   . Hyperlipidemia   . Hypertension   .  Sleep apnea   . Tubular adenoma of colon     Review of systems:      Physical Exam    Heart and lungs: CTA no wheezes     HEENT: RR nl S1, S2    Other: alert, oriented. Judgement normal    Pertinant exam for procedure: abdomen soft, nt, nd. No masses. BS+.   Planned proceedures: Proceed with EGD with biopsy and possible esophageal dilation. The patient understands the nature of the planned procedure, indications, risks, alternatives and potential complications including but not limited to bleeding, infection, perforation and oversedation. She wishes to proceed. See recommendations on procedure report.      Lollie Sails, MD Gastroenterology 09/09/2017  7:45 AM

## 2017-09-09 NOTE — Op Note (Addendum)
[facility] Gastroenterology Patient Name: Rhonda Thornton Procedure Date: 09/09/2017 7:04 AM MRN: [id number] Account #: [id number] Date of Birth: 1951/01/12 Admit Type: Outpatient Age: 66 Room: [facility] ENDO ROOM 1 Gender: Female Note Status: Supervisor Override Procedure:            Upper GI endoscopy Indications:          Esophageal dysphagia, Esophageal reflux, Abnormal UGI                        series, Endoscopy to confirm esophageal obstruction                        that was demonstrated on previous imaging study, Chest                        pain (non cardiac), Personal history of lower GI                        endoscopy Providers:            Benay Pike. [facility] Referring MD:         Denton Lank MD, MD (Referring MD) Medicines:            Propofol per Anesthesia Complications:        No immediate complications. Procedure:            Pre-Anesthesia Assessment:                       - ASA Grade Assessment: II - A patient with mild                        systemic disease.                       - After reviewing the risks and benefits, the patient                        was deemed in satisfactory condition to undergo the                        procedure.                       - The anesthesia plan was to use monitored anesthesia                        care (MAC).                       - Immediately prior to administration of medications,                        the patient was re-assessed for adequacy to receive                        sedatives.                       - Sedation was administered by an anesthesia                        professional. The sedation level attained was moderate.                       -  The heart rate, respiratory rate, oxygen saturations,                        blood pressure, adequacy of pulmonary ventilation, and                        response to care were monitored throughout the                        procedure.  After obtaining informed consent, the endoscope was                        passed under direct vision. Throughout the procedure,                        the patient's blood pressure, pulse, and oxygen                        saturations were monitored continuously. The was                        introduced through the mouth, with the intention of                        advancing to the duodenum. The scope was advanced to                        the third part of the duodenum before the procedure was                        aborted. Medications were given. The was introduced                        through the mouth, and advanced to the third part of                        duodenum. The Endoscope was introduced through the                        mouth, and advanced to the. The upper GI endoscopy was                        accomplished without difficulty. The patient tolerated                        the procedure well. The patient tolerated the procedure                        well. Findings:      The Z-line was irregular and was found 35 cm from the incisors.      Some mild distal narrowing of the esophagus was noted without obvious       stricture, ring, mass or other mucosal change. The scope was withdrawn.       Dilation was performed with a Maloney dilator with mild resistance at 74       Fr.      Suspect gastroparesis due to retained gastric contents.      The examined duodenum was normal. Impression:           -  Z-line irregular, 35 cm from the incisors.                       - Gastroparesis, secondary to diabetes mellitus type II.                       - Normal examined duodenum.                       - No specimens collected. Recommendation:       - Discharge patient to home (with spouse).                       - Diabetic (ADA) diet.                       - Continue present medications.                       - Return to GI office in 6 weeks.                       - The findings and  recommendations were discussed with                        the patient.                       - The findings and recommendations were discussed with                        the patient's family. 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 All rights reserved. The codes documented in this report are preliminary and upon coder review may  be revised to meet current compliance requirements. Efrain Sella MD, MD 09/09/2017 10:37:49 AM This report has been signed electronically. Number of Addenda: 0 Note Initiated On: 09/09/2017 7:04 AM      [facility]

## 2017-09-09 NOTE — Anesthesia Postprocedure Evaluation (Signed)
Anesthesia Post Note  Patient: Rhonda Thornton  Procedure(s) Performed: Procedure(s) (LRB): ESOPHAGOGASTRODUODENOSCOPY (EGD) WITH PROPOFOL (N/A)  Patient location during evaluation: PACU Anesthesia Type: General Level of consciousness: awake and alert Pain management: pain level controlled Vital Signs Assessment: post-procedure vital signs reviewed and stable Respiratory status: spontaneous breathing, nonlabored ventilation, respiratory function stable and patient connected to nasal cannula oxygen Cardiovascular status: blood pressure returned to baseline and stable Postop Assessment: no apparent nausea or vomiting Anesthetic complications: no     Last Vitals:  Vitals:   09/09/17 0815 09/09/17 0825  BP:  126/65  Pulse: 86 82  Resp: 18 (!) 25  Temp:    SpO2: 99% 93%    Last Pain:  Vitals:   09/09/17 0805  TempSrc: Tympanic                 Molli Barrows

## 2017-09-10 ENCOUNTER — Encounter: Payer: Self-pay | Admitting: Internal Medicine

## 2017-10-22 DIAGNOSIS — K3184 Gastroparesis: Secondary | ICD-10-CM | POA: Insufficient documentation

## 2018-11-26 ENCOUNTER — Other Ambulatory Visit: Payer: Self-pay | Admitting: Family Medicine

## 2018-11-26 DIAGNOSIS — N6310 Unspecified lump in the right breast, unspecified quadrant: Secondary | ICD-10-CM

## 2018-11-26 DIAGNOSIS — M8580 Other specified disorders of bone density and structure, unspecified site: Secondary | ICD-10-CM

## 2019-01-20 ENCOUNTER — Ambulatory Visit
Admission: RE | Admit: 2019-01-20 | Discharge: 2019-01-20 | Disposition: A | Discharge status: Home / Self Care | Payer: Medicare HMO | Source: Ambulatory Visit | Attending: Family Medicine | Admitting: Family Medicine

## 2019-01-20 ENCOUNTER — Other Ambulatory Visit: Payer: Self-pay | Admitting: Family Medicine

## 2019-01-20 DIAGNOSIS — N6310 Unspecified lump in the right breast, unspecified quadrant: Secondary | ICD-10-CM

## 2019-01-20 DIAGNOSIS — M8580 Other specified disorders of bone density and structure, unspecified site: Secondary | ICD-10-CM

## 2020-10-22 ENCOUNTER — Ambulatory Visit: Payer: Medicare HMO | Admitting: Urology

## 2020-10-22 ENCOUNTER — Other Ambulatory Visit: Payer: Self-pay

## 2020-10-22 VITALS — BP 104/62 | HR 70

## 2020-10-22 DIAGNOSIS — R3915 Urgency of urination: Secondary | ICD-10-CM | POA: Diagnosis not present

## 2020-10-22 DIAGNOSIS — N3946 Mixed incontinence: Secondary | ICD-10-CM

## 2020-10-22 NOTE — Patient Instructions (Signed)
Cystoscopy Cystoscopy is a procedure that is used to help diagnose and sometimes treat conditions that affect the lower urinary tract. The lower urinary tract includes the bladder and the urethra. The urethra is the tube that drains urine from the bladder. Cystoscopy is done using a thin, tube-shaped instrument with a light and camera at the end (cystoscope). The cystoscope may be hard or flexible, depending on the goal of the procedure. The cystoscope is inserted through the urethra, into the bladder. Cystoscopy may be recommended if you have:  Urinary tract infections that keep coming back.  Blood in the urine (hematuria).  An inability to control when you urinate (urinary incontinence) or an overactive bladder.  Unusual cells found in a urine sample.  A blockage in the urethra, such as a urinary stone.  Painful urination.  An abnormality in the bladder found during an intravenous pyelogram (IVP) or CT scan. Cystoscopy may also be done to remove a sample of tissue to be examined under a microscope (biopsy). What are the risks? Generally, this is a safe procedure. However, problems may occur, including:  Infection.  Bleeding.  What happens during the procedure?  1. You will be given one or more of the following: ? A medicine to numb the area (local anesthetic). 2. The area around the opening of your urethra will be cleaned. 3. The cystoscope will be passed through your urethra into your bladder. 4. Germ-free (sterile) fluid will flow through the cystoscope to fill your bladder. The fluid will stretch your bladder so that your health care provider can clearly examine your bladder walls. 5. Your doctor will look at the urethra and bladder. 6. The cystoscope will be removed The procedure may vary among health care providers  What can I expect after the procedure? After the procedure, it is common to have: 1. Some soreness or pain in your abdomen and urethra. 2. Urinary symptoms.  These include: ? Mild pain or burning when you urinate. Pain should stop within a few minutes after you urinate. This may last for up to 1 week. ? A small amount of blood in your urine for several days. ? Feeling like you need to urinate but producing only a small amount of urine. Follow these instructions at home: General instructions  Return to your normal activities as told by your health care provider.   Do not drive for 24 hours if you were given a sedative during your procedure.  Watch for any blood in your urine. If the amount of blood in your urine increases, call your health care provider.  If a tissue sample was removed for testing (biopsy) during your procedure, it is up to you to get your test results. Ask your health care provider, or the department that is doing the test, when your results will be ready.  Drink enough fluid to keep your urine pale yellow.  Keep all follow-up visits as told by your health care provider. This is important. Contact a health care provider if you:  Have pain that gets worse or does not get better with medicine, especially pain when you urinate.  Have trouble urinating.  Have more blood in your urine. Get help right away if you:  Have blood clots in your urine.  Have abdominal pain.  Have a fever or chills.  Are unable to urinate. Summary  Cystoscopy is a procedure that is used to help diagnose and sometimes treat conditions that affect the lower urinary tract.  Cystoscopy is done using   a thin, tube-shaped instrument with a light and camera at the end.  After the procedure, it is common to have some soreness or pain in your abdomen and urethra.  Watch for any blood in your urine. If the amount of blood in your urine increases, call your health care provider.  If you were prescribed an antibiotic medicine, take it as told by your health care provider. Do not stop taking the antibiotic even if you start to feel better. This  information is not intended to replace advice given to you by your health care provider. Make sure you discuss any questions you have with your health care provider. Document Revised: 11/30/2018 Document Reviewed: 11/30/2018 Elsevier Patient Education  2020 Elsevier Inc.   

## 2020-10-22 NOTE — Progress Notes (Signed)
10/22/2020 9:49 AM   Rhonda Thornton 14-Sep-1951 [id number]  Referring provider: [facility] [address] [place],  [place] [id number]  Chief Complaint  Patient presents with  . Urinary Incontinence    HPI: I was consulted to assess the patient is urinary incontinence.  She leaks with coughing sneezing bending lifting.  She has urge incontinence is the primary complaint.  She has significant foot on the floor syndrome.  She has no bedwetting.  She can soak 2 pads a day especially at night.  She does have ankle edema.  Leaks a small amount with coughing sneezing  She gets up at least twice a night.  She voids every 2 hours during the day.  Flow is reasonable  She is currently on Detrol is a partial responder and is failed one other medication by history.  She is on oral hypoglycemics.  She has had a hysterectomy  No history of kidney stones bladder surgery or bladder infections.  Bowel movements normal   PMH: Past Medical History:  Diagnosis Date  . Anemia   . Anginal pain ([place])   . Arthritis   . Diabetes mellitus without complication ([place])   . Edema of left lower extremity   . GERD (gastroesophageal reflux disease)   . Hyperlipidemia   . Hypertension   . Sleep apnea   . Tubular adenoma of colon     Surgical History: Past Surgical History:  Procedure Laterality Date  . ABDOMINAL HYSTERECTOMY    . BREAST CYST ASPIRATION Right 07/16/2016   neg  . CHOLECYSTECTOMY    . COLONOSCOPY WITH PROPOFOL N/A 07/30/2016   Procedure: COLONOSCOPY WITH PROPOFOL;  Surgeon: Lollie Sails, MD;  [facility];  Service: Endoscopy;  Laterality: N/A;  . ESOPHAGOGASTRODUODENOSCOPY (EGD) WITH PROPOFOL N/A 09/09/2017   Procedure: ESOPHAGOGASTRODUODENOSCOPY (EGD) WITH PROPOFOL;  Surgeon: [place], Benay Pike, MD;  Location: [facility] ENDOSCOPY;  Service: Endoscopy;  Laterality: N/A;  . EYE SURGERY    . FUNCTIONAL ENDOSCOPIC SINUS SURGERY    .  TONSILLECTOMY      Home Medications:  Allergies as of 10/22/2020   No Known Allergies     Medication List       Accurate as of October 22, 2020  9:49 AM. If you have any questions, ask your nurse or doctor.        alum & mag hydroxide-simeth 200-200-20 MG/5ML suspension Commonly known as: MAALOX/MYLANTA Take 15 mLs by mouth as needed for indigestion or heartburn.   amLODipine 5 MG tablet Commonly known as: NORVASC Take 5 mg by mouth daily.   aspirin 325 MG EC tablet Take 325 mg by mouth 2 (two) times daily.   atorvastatin 80 MG tablet Commonly known as: LIPITOR Take 80 mg by mouth daily.   beclomethasone 42 MCG/SPRAY nasal spray Commonly known as: BECONASE-AQ Place 2 sprays into both nostrils 2 (two) times daily. Dose is for each nostril.   calcium carbonate 500 MG chewable tablet Commonly known as: TUMS - dosed in mg elemental calcium Chew 1 tablet by mouth as needed for indigestion or heartburn.   cetirizine 10 MG tablet Commonly known as: ZYRTEC Take 10 mg by mouth daily.   cyclobenzaprine 10 MG tablet Commonly known as: FLEXERIL Take 10 mg by mouth 3 (three) times daily as needed for muscle spasms.   furosemide 20 MG tablet Commonly known as: LASIX   glipiZIDE 5 MG tablet Commonly known as: GLUCOTROL Take by mouth daily before breakfast.  HYDROcodone-acetaminophen 7.5-325 MG tablet Commonly known as: NORCO Take 1 tablet by mouth 2 (two) times daily.   ibuprofen 200 MG tablet Commonly known as: ADVIL Take 200 mg by mouth every 8 (eight) hours as needed.   lisinopril 5 MG tablet Commonly known as: ZESTRIL Take 5 mg by mouth daily.   lisinopril 10 MG tablet Commonly known as: ZESTRIL Take 10 mg by mouth daily.   metFORMIN 500 MG tablet Commonly known as: GLUCOPHAGE Take 500 mg by mouth 4 (four) times daily.   metoCLOPramide 10 MG tablet Commonly known as: REGLAN Take 10 mg by mouth 3 (three) times daily before meals.   pantoprazole 40 MG  tablet Commonly known as: Protonix Take 1 tablet (40 mg total) by mouth daily.   phenytoin 100 MG ER capsule Commonly known as: DILANTIN Take 300 mg by mouth 3 (three) times daily.   pregabalin 100 MG capsule Commonly known as: LYRICA Take 100 mg by mouth 3 (three) times daily.   sertraline 100 MG tablet Commonly known as: ZOLOFT Take 100 mg by mouth daily. Take 1 1/2 tablet a day   tolterodine 4 MG 24 hr capsule Commonly known as: DETROL LA       Allergies: No Known Allergies  Family History: Family History  Problem Relation Age of Onset  . Breast cancer Neg Hx     Social History:  reports that she has never smoked. She has never used smokeless tobacco. She reports that she does not drink alcohol and does not use drugs.  ROS:                                        Physical Exam: There were no vitals taken for this visit.  Constitutional:  Alert and oriented, No acute distress. HEENT: [place] AT, moist mucus membranes.  Trachea midline, no masses. Cardiovascular: No clubbing, cyanosis, or edema. Respiratory: Normal respiratory effort, no increased work of breathing. GI: Abdomen is soft, nontender, nondistended, no abdominal masses GU: Exam a little bit limited by mobility and obesity.  Well supported bladder neck.  No prolapse or stress incontinence with a light cough Skin: No rashes, bruises or suspicious lesions. Lymph: No cervical or inguinal adenopathy. Neurologic: Grossly intact, no focal deficits, moving all 4 extremities. Psychiatric: Normal mood and affect.  Laboratory Data: Lab Results  Component Value Date   WBC 6.5 08/23/2017   HGB 11.6 (L) 08/23/2017   HCT 34.1 (L) 08/23/2017   MCV 86.1 08/23/2017   PLT 200 08/23/2017    Lab Results  Component Value Date   CREATININE 0.54 08/23/2017    No results found for: PSA  No results found for: TESTOSTERONE  No results found for: HGBA1C  Urinalysis No results found for:  COLORURINE, APPEARANCEUR, LABSPEC, PHURINE, GLUCOSEU, HGBUR, BILIRUBINUR, KETONESUR, PROTEINUR, UROBILINOGEN, NITRITE, LEUKOCYTESUR  Pertinent Imaging: Reviewed.  Urine reviewed.  Urine sent for culture.  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  She primarily has urge incontinence.  She likely has nocturnal diuresis.  She is failed by history to medication.  I would like to put her on Myrbetriq and have her come back in 6 weeks for pelvic examination and cystoscopy.  Call if urine culture is positive.  Urodynamics I think would be very helpful.  She uses a walker and cane and this may or may  not be ideal for her to travel to [place].  Certainly we could offer percutaneous tibial nerve stimulation without the test.  Having said that I do not think the urodynamics will change the management  Due to lack of Myrbetriq samples I gave her 6 weeks of the new beta 3 agonist and I will see her in 6 weeks for cystoscopy.  We can always provide a prescription or try Myrbetriq  There are no diagnoses linked to this encounter.  No follow-ups on file.  Reece Packer, MD  [place] [address] [place], [place] [address] [phone]

## 2020-10-23 LAB — URINALYSIS, COMPLETE
Bilirubin, UA: NEGATIVE
Glucose, UA: NEGATIVE
Nitrite, UA: NEGATIVE
Protein,UA: NEGATIVE
RBC, UA: NEGATIVE
Specific Gravity, UA: >1.03 — ABNORMAL HIGH (ref 1.005–1.030)
Urobilinogen, Ur: 0.2 mg/dL (ref 0.2–1.0)
pH, UA: 5 (ref 5.0–7.5)

## 2020-10-23 LAB — MICROSCOPIC EXAMINATION: Bacteria, UA: NONE SEEN

## 2020-10-26 LAB — CULTURE, URINE COMPREHENSIVE

## 2020-12-03 ENCOUNTER — Encounter: Payer: Self-pay | Admitting: Urology

## 2020-12-03 ENCOUNTER — Ambulatory Visit: Payer: Medicare HMO | Admitting: Urology

## 2020-12-03 ENCOUNTER — Other Ambulatory Visit: Payer: Self-pay

## 2020-12-03 VITALS — BP 119/72 | HR 64

## 2020-12-03 DIAGNOSIS — N3946 Mixed incontinence: Secondary | ICD-10-CM | POA: Diagnosis not present

## 2020-12-03 LAB — MICROSCOPIC EXAMINATION
Bacteria, UA: NONE SEEN
Epithelial Cells (non renal): NONE SEEN /hpf (ref 0–10)

## 2020-12-03 LAB — URINALYSIS, COMPLETE
Bilirubin, UA: NEGATIVE
Glucose, UA: NEGATIVE
Ketones, UA: NEGATIVE
Leukocytes,UA: NEGATIVE
Nitrite, UA: NEGATIVE
Protein,UA: NEGATIVE
RBC, UA: NEGATIVE
Specific Gravity, UA: 1.015 (ref 1.005–1.030)
Urobilinogen, Ur: 0.2 mg/dL (ref 0.2–1.0)
pH, UA: 5.5 (ref 5.0–7.5)

## 2020-12-03 MED ORDER — MIRABEGRON ER 50 MG PO TB24: 50.0000 mg | ORAL_TABLET | Freq: Every day | ORAL | 11 refills | Status: DC

## 2020-12-03 NOTE — Addendum Note (Signed)
Addended by: Verlene Mayer A on: 12/03/2020 10:37 AM   Modules accepted: Orders

## 2020-12-03 NOTE — Progress Notes (Signed)
12/03/2020 10:18 AM   Rhonda Thornton 23-May-1951 [id number]  Referring provider: [facility], [place],  [place] [address]  Chief Complaint  Patient presents with  . Cysto    HPI: I was consulted to assess the patient is urinary incontinence.  She leaks with coughing sneezing bending lifting.  She has urge incontinence is the primary complaint.  She has significant foot on the floor syndrome.  She has no bedwetting.  She can soak 2 pads a day especially at night.  She does have ankle edema.  Leaks a small amount with coughing sneezing  She gets up at least twice a night.  She voids every 2 hours during the day.  Flow is reasonable  She is currently on Detrol is a partial responder and is failed one other medication by history.  She is on oral hypoglycemics.  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  She primarily has urge incontinence.  She likely has nocturnal diuresis.  She is failed by history two medication.  I would like to put her on Myrbetriq and have her come back in 6 weeks for pelvic examination and cystoscopy.  Call if urine culture is positive.  Urodynamics I think would be very helpful.  She uses a walker and cane and this may or may not be ideal for her to travel to [place].  Certainly we could offer percutaneous tibial nerve stimulation without the test.  Having said that I do not think the urodynamics will change the management  Due to lack of Myrbetriq samples I gave her 6 weeks of the new beta 3 agonist and I will see her in 6 weeks for cystoscopy.  We can always provide a prescription or try Myrbetriq  Today We can see stable.  Last culture negative.  Incontinence persisting and failed to medication On 12 examination bladder neck reasonably well supported with no prolapse Cystoscopy: Patient underwent flexible cystoscopy.  Bladder mucosa and trigone were normal.   No cystitis.  No carcinoma.  Urine aspirated and sent for culture   PMH: Past Medical History:  Diagnosis Date  . Anemia   . Anginal pain ([place])   . Arthritis   . Diabetes mellitus without complication ([place])   . Edema of left lower extremity   . GERD (gastroesophageal reflux disease)   . Hyperlipidemia   . Hypertension   . Sleep apnea   . Tubular adenoma of colon     Surgical History: Past Surgical History:  Procedure Laterality Date  . ABDOMINAL HYSTERECTOMY    . BREAST CYST ASPIRATION Right 07/16/2016   neg  . CHOLECYSTECTOMY    . COLONOSCOPY WITH PROPOFOL N/A 07/30/2016   Procedure: COLONOSCOPY WITH PROPOFOL;  Surgeon: Lollie Sails, MD;  Location: [facility] ENDOSCOPY;  Service: Endoscopy;  Laterality: N/A;  . ESOPHAGOGASTRODUODENOSCOPY (EGD) WITH PROPOFOL N/A 09/09/2017   Procedure: ESOPHAGOGASTRODUODENOSCOPY (EGD) WITH PROPOFOL;  Surgeon: Toledo, Benay Pike, MD;  Location: [facility] ENDOSCOPY;  Service: Endoscopy;  Laterality: N/A;  . EYE SURGERY    . FUNCTIONAL ENDOSCOPIC SINUS SURGERY    . TONSILLECTOMY      Home Medications:  Allergies as of 12/03/2020   No Known Allergies     Medication List       Accurate as of December 03, 2020 10:18 AM. If you have any questions, ask your nurse or doctor.        alum & mag  hydroxide-simeth 200-200-20 MG/5ML suspension Commonly known as: MAALOX/MYLANTA Take 15 mLs by mouth as needed for indigestion or heartburn.   amLODipine 5 MG tablet Commonly known as: NORVASC Take 5 mg by mouth daily.   aspirin 325 MG EC tablet Take 325 mg by mouth 2 (two) times daily.   atorvastatin 80 MG tablet Commonly known as: LIPITOR Take 80 mg by mouth daily.   beclomethasone 42 MCG/SPRAY nasal spray Commonly known as: BECONASE-AQ Place 2 sprays into both nostrils 2 (two) times daily. Dose is for each nostril.   calcium carbonate 500 MG chewable tablet Commonly known as: TUMS - dosed in mg elemental calcium Chew 1 tablet by mouth as needed  for indigestion or heartburn.   cetirizine 10 MG tablet Commonly known as: ZYRTEC Take 10 mg by mouth daily.   cyclobenzaprine 10 MG tablet Commonly known as: FLEXERIL Take 10 mg by mouth 3 (three) times daily as needed for muscle spasms.   dorzolamide-timolol 22.3-6.8 MG/ML ophthalmic solution Commonly known as: COSOPT   furosemide 20 MG tablet Commonly known as: LASIX   glipiZIDE 5 MG tablet Commonly known as: GLUCOTROL Take by mouth daily before breakfast.   HYDROcodone-acetaminophen 7.5-325 MG tablet Commonly known as: NORCO Take 1 tablet by mouth 2 (two) times daily.   ibuprofen 200 MG tablet Commonly known as: ADVIL Take 200 mg by mouth every 8 (eight) hours as needed.   latanoprost 0.005 % ophthalmic solution Commonly known as: XALATAN   lisinopril 5 MG tablet Commonly known as: ZESTRIL Take 5 mg by mouth daily.   lisinopril 10 MG tablet Commonly known as: ZESTRIL Take 10 mg by mouth daily.   metFORMIN 500 MG tablet Commonly known as: GLUCOPHAGE Take 500 mg by mouth 4 (four) times daily.   metoCLOPramide 10 MG tablet Commonly known as: REGLAN Take 10 mg by mouth 3 (three) times daily before meals.   pantoprazole 40 MG tablet Commonly known as: Protonix Take 1 tablet (40 mg total) by mouth daily.   phenytoin 100 MG ER capsule Commonly known as: DILANTIN Take 300 mg by mouth 3 (three) times daily.   pregabalin 100 MG capsule Commonly known as: LYRICA Take 100 mg by mouth 3 (three) times daily.   sertraline 100 MG tablet Commonly known as: ZOLOFT Take 100 mg by mouth daily. Take 1 1/2 tablet a day   tolterodine 4 MG 24 hr capsule Commonly known as: DETROL LA       Allergies: No Known Allergies  Family History: Family History  Problem Relation Age of Onset  . Breast cancer Neg Hx     Social History:  reports that she has never smoked. She has never used smokeless tobacco. She reports that she does not drink alcohol and does not use  drugs.  ROS:                                        Physical Exam: BP 119/72   Pulse 64   Constitutional:  Alert and oriented, No acute distress.   Laboratory Data: Lab Results  Component Value Date   WBC 6.5 08/23/2017   HGB 11.6 (L) 08/23/2017   HCT 34.1 (L) 08/23/2017   MCV 86.1 08/23/2017   PLT 200 08/23/2017    Lab Results  Component Value Date   CREATININE 0.54 08/23/2017    No results found for: PSA  No results found for: TESTOSTERONE  No results found for: HGBA1C  Urinalysis    Component Value Date/Time   APPEARANCEUR Hazy (A) 10/22/2020 1031   GLUCOSEU Negative 10/22/2020 1031   BILIRUBINUR Negative 10/22/2020 1031   PROTEINUR Negative 10/22/2020 1031   NITRITE Negative 10/22/2020 1031   LEUKOCYTESUR Trace (A) 10/22/2020 1031    Pertinent Imaging:   Assessment & Plan: Reassess in 6 weeks on Myrbetriq samples and prescription.  Offer percutaneous tibial nerve stimulation is still leaking.  1. Mixed incontinence  - Urinalysis, Complete   No follow-ups on file.  Reece Packer, MD  [facility] [address] [place], [place] [address] [phone]

## 2020-12-08 LAB — CULTURE, URINE COMPREHENSIVE

## 2021-01-14 ENCOUNTER — Ambulatory Visit: Payer: Self-pay | Admitting: Urology

## 2021-01-21 ENCOUNTER — Ambulatory Visit: Payer: Self-pay | Admitting: Urology

## 2021-02-04 ENCOUNTER — Telehealth: Payer: Self-pay | Admitting: *Deleted

## 2021-02-04 ENCOUNTER — Other Ambulatory Visit: Payer: Self-pay

## 2021-02-04 ENCOUNTER — Ambulatory Visit (INDEPENDENT_AMBULATORY_CARE_PROVIDER_SITE_OTHER): Payer: Medicare HMO | Admitting: Urology

## 2021-02-04 VITALS — BP 115/59 | HR 71

## 2021-02-04 DIAGNOSIS — N3946 Mixed incontinence: Secondary | ICD-10-CM

## 2021-02-04 NOTE — Telephone Encounter (Signed)
No prior auth needed for ptns

## 2021-02-04 NOTE — Progress Notes (Signed)
02/04/2021 3:07 PM   Rhonda Thornton Jan 01, 1951 [id number]  Referring provider: [facility], [place],  [place] [address]  Chief Complaint  Patient presents with  . Follow-up    HPI: I was consulted to assess the patient is urinary incontinence. She leaks with coughing sneezing bending lifting. She has urge incontinence is the primary complaint. She has significant foot on the floor syndrome. She has no bedwetting. She can soak 2 pads a day especially at night. She does have ankle edema.Leaks a small amount with coughing sneezing  She gets up at least twice a night. She voids every 2 hours during the day. Flow is reasonable  She is currently on Detrol is a partial responder and is failed one other medication by history. She is on oral hypoglycemics. 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 She primarily has urge incontinence. She likely has nocturnal diuresis. She is failed by history two medication. I would like to put her on Myrbetriq and have her come back in 6 weeks for pelvic examination and cystoscopy. Call if urine culture is positive. Urodynamics I think would be very helpful. She uses a walker and cane and this may or may not be ideal for her to travel to [place]. Certainly we could offer percutaneous tibial nerve stimulation without the test.Having said that I do not think the urodynamics will change the management  Due to lack of Myrbetriq samples I gave her 6 weeks of the new beta 3 agonist and I will see her in 6 weeks for cystoscopy. We can always provide a prescription or try Myrbetriq   Last culture negative.  Incontinence persisting and failed to medication On 12 examination bladder neck reasonably well supported with no prolapse Cystoscopy: Normal  Reassess in 6 weeks on Myrbetriq samples and prescription.  Offer percutaneous tibial nerve  stimulation is still leaking.  Today Frequency stable.  Last urine culture negative.  Incontinence persisting.  Medication failed.  We talked with percutaneous tibial nerve stimulation she like to proceed   PMH: Past Medical History:  Diagnosis Date  . Anemia   . Anginal pain ([place])   . Arthritis   . Diabetes mellitus without complication ([place])   . Edema of left lower extremity   . GERD (gastroesophageal reflux disease)   . Hyperlipidemia   . Hypertension   . Sleep apnea   . Tubular adenoma of colon     Surgical History: Past Surgical History:  Procedure Laterality Date  . ABDOMINAL HYSTERECTOMY    . BREAST CYST ASPIRATION Right 07/16/2016   neg  . CHOLECYSTECTOMY    . COLONOSCOPY WITH PROPOFOL N/A 07/30/2016   Procedure: COLONOSCOPY WITH PROPOFOL;  Surgeon: Lollie Sails, MD;  Location: [facility] ENDOSCOPY;  Service: Endoscopy;  Laterality: N/A;  . ESOPHAGOGASTRODUODENOSCOPY (EGD) WITH PROPOFOL N/A 09/09/2017   Procedure: ESOPHAGOGASTRODUODENOSCOPY (EGD) WITH PROPOFOL;  Surgeon: Toledo, Benay Pike, MD;  Location: [facility] ENDOSCOPY;  Service: Endoscopy;  Laterality: N/A;  . EYE SURGERY    . FUNCTIONAL ENDOSCOPIC SINUS SURGERY    . TONSILLECTOMY      Home Medications:  Allergies as of 02/04/2021   No Known Allergies     Medication List       Accurate as of February 04, 2021  3:07 PM. If you have any questions, ask your nurse or doctor.        STOP taking these medications   pregabalin  100 MG capsule Commonly known as: LYRICA Stopped by: Reece Packer, MD     TAKE these medications   alum & mag hydroxide-simeth 200-200-20 MG/5ML suspension Commonly known as: MAALOX/MYLANTA Take 15 mLs by mouth as needed for indigestion or heartburn.   amLODipine 5 MG tablet Commonly known as: NORVASC Take 5 mg by mouth daily.   aspirin 325 MG EC tablet Take 325 mg by mouth 2 (two) times daily.   atorvastatin 80 MG tablet Commonly known as: LIPITOR Take 80 mg by mouth  daily.   beclomethasone 42 MCG/SPRAY nasal spray Commonly known as: BECONASE-AQ Place 2 sprays into both nostrils 2 (two) times daily. Dose is for each nostril.   calcium carbonate 500 MG chewable tablet Commonly known as: TUMS - dosed in mg elemental calcium Chew 1 tablet by mouth as needed for indigestion or heartburn.   cetirizine 10 MG tablet Commonly known as: ZYRTEC Take 10 mg by mouth daily.   cyclobenzaprine 10 MG tablet Commonly known as: FLEXERIL Take 10 mg by mouth 3 (three) times daily as needed for muscle spasms.   dorzolamide-timolol 22.3-6.8 MG/ML ophthalmic solution Commonly known as: COSOPT   fluticasone 50 MCG/ACT nasal spray Commonly known as: FLONASE USE 2 SPRAYS IN EACH NOSTRIL DAILY FOR ALLERGIES   furosemide 20 MG tablet Commonly known as: LASIX   glipiZIDE 5 MG tablet Commonly known as: GLUCOTROL Take by mouth daily before breakfast.   HYDROcodone-acetaminophen 7.5-325 MG tablet Commonly known as: NORCO Take 1 tablet by mouth 2 (two) times daily.   ibuprofen 200 MG tablet Commonly known as: ADVIL Take 200 mg by mouth every 8 (eight) hours as needed.   latanoprost 0.005 % ophthalmic solution Commonly known as: XALATAN   lisinopril 5 MG tablet Commonly known as: ZESTRIL Take 5 mg by mouth daily.   lisinopril 10 MG tablet Commonly known as: ZESTRIL Take 10 mg by mouth daily.   metFORMIN 500 MG tablet Commonly known as: GLUCOPHAGE Take 500 mg by mouth 4 (four) times daily.   metoCLOPramide 10 MG tablet Commonly known as: REGLAN Take 10 mg by mouth 3 (three) times daily before meals.   mirabegron ER 50 MG Tb24 tablet Commonly known as: MYRBETRIQ Take 1 tablet (50 mg total) by mouth daily.   pantoprazole 40 MG tablet Commonly known as: Protonix Take 1 tablet (40 mg total) by mouth daily.   phenytoin 100 MG ER capsule Commonly known as: DILANTIN Take 300 mg by mouth 3 (three) times daily.   sertraline 100 MG tablet Commonly  known as: ZOLOFT Take 100 mg by mouth daily. Take 1 1/2 tablet a day       Allergies: No Known Allergies  Family History: Family History  Problem Relation Age of Onset  . Breast cancer Neg Hx     Social History:  reports that she has never smoked. She has never used smokeless tobacco. She reports that she does not drink alcohol and does not use drugs.  ROS:                                        Physical Exam: BP (!) 115/59   Pulse 71   Constitutional:  Alert and oriented, No acute distress.   Laboratory Data: Lab Results  Component Value Date   WBC 6.5 08/23/2017   HGB 11.6 (L) 08/23/2017   HCT 34.1 (L) 08/23/2017   MCV  86.1 08/23/2017   PLT 200 08/23/2017    Lab Results  Component Value Date   CREATININE 0.54 08/23/2017    No results found for: PSA  No results found for: TESTOSTERONE  No results found for: HGBA1C  Urinalysis    Component Value Date/Time   APPEARANCEUR Clear 12/03/2020 1033   GLUCOSEU Negative 12/03/2020 1033   BILIRUBINUR Negative 12/03/2020 1033   PROTEINUR Negative 12/03/2020 1033   NITRITE Negative 12/03/2020 1033   LEUKOCYTESUR Negative 12/03/2020 1033    Pertinent Imaging:   Assessment & Plan: Start percutaneous tibial nerve stimulation.  Handout given.  Hopefully she is a good candidate for Botox or InterStim and I would need urodynamics prior  There are no diagnoses linked to this encounter.  No follow-ups on file.  Reece Packer, MD  [facility] [address] [place], [place] [address] [phone]

## 2021-03-11 ENCOUNTER — Other Ambulatory Visit: Payer: Self-pay

## 2021-03-11 ENCOUNTER — Ambulatory Visit (INDEPENDENT_AMBULATORY_CARE_PROVIDER_SITE_OTHER): Payer: Medicare HMO

## 2021-03-11 DIAGNOSIS — N3946 Mixed incontinence: Secondary | ICD-10-CM

## 2021-03-11 NOTE — Progress Notes (Signed)
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 Pt given voiding diary.  Performed By: Gordy Clement, CMA   Follow Up: RTC in 1 week for PTNS

## 2021-03-11 NOTE — Patient Instructions (Signed)
Tracking Your Bladder Symptoms    Patient Name:___________________________________________________   Sample: Day   Daytime Voids  Nighttime Voids Urgency for the Day(0-4) Number of Accidents Beverage Comments  Monday IIII II 2 I Water IIII Coffee  I      Week Starting:____________________________________   Day Daytime  Voids Nighttime  Voids Urgency for  The Day(0-4) Number of Accidents Beverages Comments                                                           This week my symptoms were:  O much better  O better O the same O worse   

## 2021-03-18 ENCOUNTER — Ambulatory Visit (INDEPENDENT_AMBULATORY_CARE_PROVIDER_SITE_OTHER): Payer: Medicare HMO

## 2021-03-18 ENCOUNTER — Other Ambulatory Visit: Payer: Self-pay | Admitting: Family Medicine

## 2021-03-18 ENCOUNTER — Other Ambulatory Visit: Payer: Self-pay

## 2021-03-18 DIAGNOSIS — N3946 Mixed incontinence: Secondary | ICD-10-CM | POA: Diagnosis not present

## 2021-03-18 DIAGNOSIS — Z1231 Encounter for screening mammogram for malignant neoplasm of breast: Secondary | ICD-10-CM

## 2021-03-18 NOTE — Progress Notes (Signed)
PTNS  Session # 2  Health & Social Factors: no changes Caffeine: 1-2 a day Alcohol: 0 Daytime voids #per day: 2-3 Night-time voids #per night: 2-3 Urgency: 2-4 Incontinence Episodes #per day: 2-3 Ankle used: left Treatment Setting: 11 Feeling/ Response: Toe flex and sensory Comments: patient tolerated well  Performed By: Kerman Passey, RMA   Follow Up: 1 week

## 2021-03-18 NOTE — Patient Instructions (Signed)
Tracking Your Bladder Symptoms    Patient Name:___________________________________________________   Sample: Day   Daytime Voids  Nighttime Voids Urgency for the Day(0-4) Number of Accidents Beverage Comments  Monday IIII II 2 I Water IIII Coffee  I      Week Starting:____________________________________   Day Daytime  Voids Nighttime  Voids Urgency for  The Day(0-4) Number of Accidents Beverages Comments                                                           This week my symptoms were:  O much better  O better O the same O worse   

## 2021-03-25 ENCOUNTER — Other Ambulatory Visit: Payer: Self-pay

## 2021-03-25 ENCOUNTER — Ambulatory Visit (INDEPENDENT_AMBULATORY_CARE_PROVIDER_SITE_OTHER): Payer: Medicare HMO

## 2021-03-25 DIAGNOSIS — N3946 Mixed incontinence: Secondary | ICD-10-CM | POA: Diagnosis not present

## 2021-03-25 DIAGNOSIS — R3915 Urgency of urination: Secondary | ICD-10-CM

## 2021-03-25 NOTE — Progress Notes (Signed)
PTNS  Session # 3  Health & Social Factors: No Change Caffeine: 0 Alcohol: 0 Daytime voids #per day: 6 Night-time voids #per night: 2 Urgency: Strong Incontinence Episodes #per day: 2 Ankle used: Right Treatment Setting: 18 Feeling/ Response: Sensory & Toe Flex  Comments:  N/A  Performed By: Gordy Clement, CMA   Follow Up: RTC in 1 week as scheduled

## 2021-03-25 NOTE — Patient Instructions (Signed)
Tracking Your Bladder Symptoms    Patient Name:___________________________________________________   Sample: Day   Daytime Voids  Nighttime Voids Urgency for the Day(0-4) Number of Accidents Beverage Comments  Monday IIII II 2 I Water IIII Coffee  I      Week Starting:____________________________________   Day Daytime  Voids Nighttime  Voids Urgency for  The Day(0-4) Number of Accidents Beverages Comments                                                           This week my symptoms were:  O much better  O better O the same O worse   

## 2021-04-01 ENCOUNTER — Other Ambulatory Visit: Payer: Self-pay

## 2021-04-01 ENCOUNTER — Ambulatory Visit (INDEPENDENT_AMBULATORY_CARE_PROVIDER_SITE_OTHER): Payer: Medicare HMO

## 2021-04-01 DIAGNOSIS — N3946 Mixed incontinence: Secondary | ICD-10-CM

## 2021-04-01 NOTE — Patient Instructions (Signed)
Tracking Your Bladder Symptoms    Patient Name:___________________________________________________   Sample: Day   Daytime Voids  Nighttime Voids Urgency for the Day(0-4) Number of Accidents Beverage Comments  Monday IIII II 2 I Water IIII Coffee  I      Week Starting:____________________________________   Day Daytime  Voids Nighttime  Voids Urgency for  The Day(0-4) Number of Accidents Beverages Comments                                                           This week my symptoms were:  O much better  O better O the same O worse   

## 2021-04-01 NOTE — Progress Notes (Signed)
PTNS  Session # 4  Health & Social Factors: No change Caffeine: 0-1 Alcohol: 0 Daytime voids #per day: 9-10 Night-time voids #per night: 2-3 Urgency: strong Incontinence Episodes #per day: 1-2 (one day 4 episodes) Ankle used: Right Treatment Setting: 13 Feeling/ Response: toe flex and sensory Comments: Pt tolerated well  Performed By: Kerman Passey, RMA   Follow Up: 1 week

## 2021-04-08 ENCOUNTER — Other Ambulatory Visit: Payer: Self-pay

## 2021-04-08 ENCOUNTER — Ambulatory Visit (INDEPENDENT_AMBULATORY_CARE_PROVIDER_SITE_OTHER): Payer: Medicare HMO

## 2021-04-08 DIAGNOSIS — R3915 Urgency of urination: Secondary | ICD-10-CM

## 2021-04-08 NOTE — Progress Notes (Signed)
PTNS  Session # 5  Health & Social Factors: no change Caffeine: 0-1 Alcohol: 0 Daytime voids #per day: 6-8 Night-time voids #per night: 1-2 Urgency: mild Incontinence Episodes #per day: 1 (one day @2 ) Ankle used: right Treatment Setting: 8 Feeling/ Response: both Comments: patient tolerated well  Performed By: Fonnie Jarvis, CMA  Follow Up: 1 week

## 2021-04-08 NOTE — Patient Instructions (Signed)
Tracking Your Bladder Symptoms    Patient Name:___________________________________________________   Sample: Day   Daytime Voids  Nighttime Voids Urgency for the Day(0-4) Number of Accidents Beverage Comments  Monday IIII II 2 I Water IIII Coffee  I      Week Starting:____________________________________   Day Daytime  Voids Nighttime  Voids Urgency for  The Day(0-4) Number of Accidents Beverages Comments                                                           This week my symptoms were:  O much better  O better O the same O worse   

## 2021-04-09 ENCOUNTER — Ambulatory Visit
Admission: RE | Admit: 2021-04-09 | Discharge: 2021-04-09 | Disposition: A | Discharge status: Home / Self Care | Payer: Medicare HMO | Source: Ambulatory Visit | Attending: Family Medicine | Admitting: Family Medicine

## 2021-04-09 DIAGNOSIS — Z1231 Encounter for screening mammogram for malignant neoplasm of breast: Secondary | ICD-10-CM | POA: Insufficient documentation

## 2021-04-15 ENCOUNTER — Ambulatory Visit (INDEPENDENT_AMBULATORY_CARE_PROVIDER_SITE_OTHER): Payer: Medicare HMO | Admitting: Family Medicine

## 2021-04-15 ENCOUNTER — Other Ambulatory Visit: Payer: Self-pay | Admitting: Family Medicine

## 2021-04-15 ENCOUNTER — Other Ambulatory Visit: Payer: Self-pay

## 2021-04-15 DIAGNOSIS — N6320 Unspecified lump in the left breast, unspecified quadrant: Secondary | ICD-10-CM

## 2021-04-15 DIAGNOSIS — R3915 Urgency of urination: Secondary | ICD-10-CM

## 2021-04-15 DIAGNOSIS — R928 Other abnormal and inconclusive findings on diagnostic imaging of breast: Secondary | ICD-10-CM

## 2021-04-15 NOTE — Patient Instructions (Signed)
Tracking Your Bladder Symptoms    Patient Name:___________________________________________________   Sample: Day   Daytime Voids  Nighttime Voids Urgency for the Day(0-4) Number of Accidents Beverage Comments  Monday IIII II 2 I Water IIII Coffee  I      Week Starting:____________________________________   Day Daytime  Voids Nighttime  Voids Urgency for  The Day(0-4) Number of Accidents Beverages Comments                                                           This week my symptoms were:  O much better  O better O the same O worse   

## 2021-04-15 NOTE — Progress Notes (Signed)
PTNS  Session # 6  Health & Social Factors: no change Caffeine: 0-1 Alcohol: 0 Daytime voids #per day: 8 Night-time voids #per night: 1-2 Urgency: mild Incontinence Episodes #per day: 2 Ankle used: right Treatment Setting: 19 Feeling/ Response: Sensory Comments: Patient tolerated well  Performed By: Elberta Leatherwood, CMA  Follow Up: 1 week #7

## 2021-04-18 ENCOUNTER — Ambulatory Visit
Admission: RE | Admit: 2021-04-18 | Discharge: 2021-04-18 | Disposition: A | Discharge status: Home / Self Care | Payer: Medicare HMO | Source: Ambulatory Visit | Attending: Family Medicine | Admitting: Family Medicine

## 2021-04-18 ENCOUNTER — Other Ambulatory Visit: Payer: Self-pay

## 2021-04-18 DIAGNOSIS — R928 Other abnormal and inconclusive findings on diagnostic imaging of breast: Secondary | ICD-10-CM | POA: Insufficient documentation

## 2021-04-18 DIAGNOSIS — N6320 Unspecified lump in the left breast, unspecified quadrant: Secondary | ICD-10-CM

## 2021-04-21 DIAGNOSIS — C50919 Malignant neoplasm of unspecified site of unspecified female breast: Secondary | ICD-10-CM

## 2021-04-21 HISTORY — DX: Malignant neoplasm of unspecified site of unspecified female breast: C50.919

## 2021-04-22 ENCOUNTER — Telehealth: Payer: Self-pay

## 2021-04-22 ENCOUNTER — Other Ambulatory Visit: Payer: Self-pay | Admitting: Family Medicine

## 2021-04-22 ENCOUNTER — Ambulatory Visit (INDEPENDENT_AMBULATORY_CARE_PROVIDER_SITE_OTHER): Payer: Medicare HMO

## 2021-04-22 ENCOUNTER — Other Ambulatory Visit: Payer: Self-pay

## 2021-04-22 DIAGNOSIS — N3946 Mixed incontinence: Secondary | ICD-10-CM | POA: Diagnosis not present

## 2021-04-22 DIAGNOSIS — N6320 Unspecified lump in the left breast, unspecified quadrant: Secondary | ICD-10-CM

## 2021-04-22 DIAGNOSIS — R928 Other abnormal and inconclusive findings on diagnostic imaging of breast: Secondary | ICD-10-CM

## 2021-04-22 DIAGNOSIS — R3915 Urgency of urination: Secondary | ICD-10-CM

## 2021-04-22 NOTE — Progress Notes (Signed)
PTNS  Session # 7  Health & Social Factors: No Change Caffeine: 1 Alcohol: 0 Daytime voids #per day: 8-9 Night-time voids #per night: 2 Urgency: Mild Incontinence Episodes #per day: 2 Ankle used: Right (per pt request)  Treatment Setting: 2 Feeling/ Response: Sensory & Toe Flex  Comments: Pt very discouraged that she has had no improvement with PTNS, also states she has a co-pay for PTNS which is expensive. She requests message sent to provider. Message sent.   Performed By: Gordy Clement, CMA   Follow Up: RTC in 1 week

## 2021-04-22 NOTE — Patient Instructions (Signed)
Tracking Your Bladder Symptoms    Patient Name:___________________________________________________   Sample: Day   Daytime Voids  Nighttime Voids Urgency for the Day(0-4) Number of Accidents Beverage Comments  Monday IIII II 2 I Water IIII Coffee  I      Week Starting:____________________________________   Day Daytime  Voids Nighttime  Voids Urgency for  The Day(0-4) Number of Accidents Beverages Comments                                                           This week my symptoms were:  O much better  O better O the same O worse   

## 2021-04-22 NOTE — Telephone Encounter (Signed)
Patient present for PTNS today, she states she is very frustrated that she has seen no improvement in her urinary symptoms. She also notes that she has a co-pay with each visit and does not want to continue to pay if her symptoms don't improve. Pt questions if there are any alternatives. Please advise.

## 2021-04-23 NOTE — Telephone Encounter (Signed)
Spoke with patient, she is agrees with the plan and understands. UDS was ordered. Does patient need to continue her PTNS? She does not want to.

## 2021-04-24 NOTE — Telephone Encounter (Signed)
Patient called the office today to report that she would like to cancel her UDS appointment at [facility].  She [place] drive [place] the highway to [place] and does not have anyone to take her. I called [facility] [phone]) and cancelled her appointment.  She has decided to continue with PTNS therapy at this time.  I confirmed her next appointment with her.

## 2021-04-25 ENCOUNTER — Ambulatory Visit
Admission: RE | Admit: 2021-04-25 | Discharge: 2021-04-25 | Disposition: A | Discharge status: Home / Self Care | Payer: Medicare HMO | Source: Ambulatory Visit | Attending: Family Medicine | Admitting: Family Medicine

## 2021-04-25 ENCOUNTER — Other Ambulatory Visit: Payer: Self-pay

## 2021-04-25 DIAGNOSIS — R928 Other abnormal and inconclusive findings on diagnostic imaging of breast: Secondary | ICD-10-CM | POA: Insufficient documentation

## 2021-04-25 DIAGNOSIS — N6320 Unspecified lump in the left breast, unspecified quadrant: Secondary | ICD-10-CM | POA: Insufficient documentation

## 2021-04-25 DIAGNOSIS — C50919 Malignant neoplasm of unspecified site of unspecified female breast: Secondary | ICD-10-CM

## 2021-04-25 HISTORY — DX: Malignant neoplasm of unspecified site of unspecified female breast: C50.919

## 2021-04-29 ENCOUNTER — Ambulatory Visit (INDEPENDENT_AMBULATORY_CARE_PROVIDER_SITE_OTHER): Payer: Medicare HMO | Admitting: *Deleted

## 2021-04-29 ENCOUNTER — Other Ambulatory Visit: Payer: Self-pay

## 2021-04-29 DIAGNOSIS — N3946 Mixed incontinence: Secondary | ICD-10-CM

## 2021-04-29 DIAGNOSIS — C50919 Malignant neoplasm of unspecified site of unspecified female breast: Secondary | ICD-10-CM

## 2021-04-29 NOTE — Patient Instructions (Addendum)
Tracking Your Bladder Symptoms    Patient Name:___________________________________________________   Sample: Day   Daytime Voids  Nighttime Voids Urgency for the Day(0-4) Number of Accidents Beverage Comments  Monday IIII II 2 I Water IIII Coffee  I      Week Starting:____________________________________   Day Daytime  Voids Nighttime  Voids Urgency for  The Day(0-4) Number of Accidents Beverages Comments                                                           This week my symptoms were:  O much better  O better O the same O worse   

## 2021-04-29 NOTE — Progress Notes (Signed)
PTNS  Session # 8  Health & Social Factors: No Change Caffeine: 1 Alcohol: 0 Daytime voids #per day: 8-9 Night-time voids #per night: 2 Urgency: Mild Incontinence Episodes #per day: 2 Ankle used: Right (per pt request)  Treatment Setting: 7 Feeling/ Response: Sensory & Toe Flex  Comments: Pt very discouraged that she has had no improvement with PTNS, also states she has a co-pay for PTNS which is expensive. She requests message sent to provider. Message sent.   Performed By: Gaspar Cola CMA  Follow Up: RTC in 1 week

## 2021-05-02 DIAGNOSIS — C50412 Malignant neoplasm of upper-outer quadrant of left female breast: Secondary | ICD-10-CM | POA: Insufficient documentation

## 2021-05-02 LAB — SURGICAL PATHOLOGY

## 2021-05-02 NOTE — Progress Notes (Signed)
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  INTERVAL HISTORY: Patient is a 70 year old female who underwent screening mammogram revealing an abnormality.  Subsequent ultrasound biopsy revealed the above-stated malignancy.  She currently feels well and is asymptomatic.  She has no neurologic complaints.  She denies any recent fevers or illnesses.  She has a good appetite and denies weight loss.  She has no chest pain, shortness of breath, cough, or hemoptysis.  She denies any nausea, vomiting, constipation, or diarrhea.  She has no urinary complaints.  Patient feels at her baseline offers no further specific complaints today.  REVIEW OF SYSTEMS:   Review of Systems  Constitutional: Negative.  Negative for fever, malaise/fatigue and weight loss.  Respiratory: Negative.  Negative for hemoptysis and shortness of breath.   Cardiovascular: Negative.  Negative for chest pain and leg swelling.  Gastrointestinal: Negative.  Negative for abdominal pain.  Genitourinary: Negative.  Negative for dysuria.  Musculoskeletal: Negative.  Negative for back pain.  Skin: Negative.  Negative for rash.  Neurological: Negative.  Negative for dizziness, focal weakness, weakness and headaches.  Psychiatric/Behavioral: Negative.  The patient is not nervous/anxious.     As per HPI. Otherwise, a complete review of systems is negative.  PAST MEDICAL HISTORY: Past Medical History:  Diagnosis Date  . Anemia   . Anginal pain ([place])   . Arthritis   . Diabetes mellitus without complication ([place])   . Edema of left lower extremity   . GERD (gastroesophageal reflux disease)   . Hyperlipidemia   . Hypertension   .  Sleep apnea   . Tubular adenoma of colon     PAST SURGICAL HISTORY: Past Surgical History:  Procedure Laterality Date  . ABDOMINAL HYSTERECTOMY  1991   partial  . BREAST BIOPSY Left 0505/2022   u/s bx-"X" clip-path pending  . BREAST CYST ASPIRATION Right 07/16/2016   neg  . CHOLECYSTECTOMY    . COLONOSCOPY WITH PROPOFOL N/A 07/30/2016   Procedure: COLONOSCOPY WITH PROPOFOL;  Surgeon: Lollie Sails, MD;  Location: [facility] END[facility]COPY;  Service: Endoscopy;  Laterality: N/A;  . ESOPHAGOGASTRODUODENOSCOPY (EGD) WITH PROPOFOL N/A 09/09/2017   Procedure: ESOPHAGOGASTRODUODENOSCOPY (EGD) WITH PROPOFOL;  Surgeon: Toledo, Benay Pike, MD;  Location: [facility] ENDOSCOPY;  Service: Endoscopy;  Laterality: N/A;  . EYE SURGERY    . FUNCTIONAL ENDOSCOPIC SINUS SURGERY    . TONSILLECTOMY      FAMILY HISTORY: Family History  Problem Relation Age of Onset  . Stomach cancer Mother   . Colon cancer Brother   . Breast cancer Neg Hx     ADVANCED DIRECTIVES (Y/N):  N  HEALTH MAINTENANCE: Social History   Tobacco Use  . Smoking status: Never Smoker  . Smokeless tobacco: Never Used  Vaping Use  . Vaping Use: Never used  Substance Use Topics  . Alcohol use: No  . Drug use: No     Colonoscopy:  PAP:  Bone density:  Lipid panel:  No Known Allergies  Current Outpatient Medications  Medication Sig Dispense Refill  . amLODipine (NORVASC) 5 MG tablet Take 5 mg by mouth daily.    Marland Kitchen aspirin 325 MG EC tablet Take 325 mg by mouth 2 (two) times daily.    Marland Kitchen atorvastatin (LIPITOR)  80 MG tablet Take 80 mg by mouth daily.    . calcium carbonate (OS-CAL) 1250 (500 Ca) MG chewable tablet Chew by mouth.    . calcium carbonate (TUMS - DOSED IN MG ELEMENTAL CALCIUM) 500 MG chewable tablet Chew 1 tablet by mouth as needed for indigestion or heartburn.    . cetirizine (ZYRTEC) 10 MG tablet Take 10 mg by mouth daily.    . dorzolamide-timolol (COSOPT) 22.3-6.8 MG/ML ophthalmic solution     . dorzolamide-timolol  (COSOPT) 22.3-6.8 MG/ML ophthalmic solution Apply to eye.    . fluticasone (FLONASE) 50 MCG/ACT nasal spray USE 2 SPRAYS IN EACH NOSTRIL DAILY FOR ALLERGIES    . glipiZIDE (GLUCOTROL) 5 MG tablet Take by mouth daily before breakfast.    . HYDROcodone-acetaminophen (NORCO) 7.5-325 MG tablet Take 1 tablet by mouth 2 (two) times daily.    Marland Kitchen ibuprofen (ADVIL,MOTRIN) 200 MG tablet Take 200 mg by mouth every 8 (eight) hours as needed.    . latanoprost (XALATAN) 0.005 % ophthalmic solution     . lisinopril (PRINIVIL,ZESTRIL) 10 MG tablet Take 10 mg by mouth daily.    . metFORMIN (GLUCOPHAGE) 500 MG tablet Take 500 mg by mouth 4 (four) times daily.    . Multiple Vitamin (DAILY-VITE MULTIVITAMIN) TABS Take 1 tablet by mouth daily.    . Multiple Vitamin (MULTIVITAMIN) capsule Take 1 capsule by mouth daily.    . phenylephrine (SUDAFED PE) 10 MG TABS tablet Take 10 mg by mouth every 4 (four) hours as needed.    . pregabalin (LYRICA) 100 MG capsule Take 100 mg by mouth 3 (three) times daily.    . sertraline (ZOLOFT) 100 MG tablet Take 100 mg by mouth daily. Take 1 1/2 tablet a day    . tolterodine (DETROL LA) 4 MG 24 hr capsule Take 4 mg by mouth daily.    Marland Kitchen lidocaine-prilocaine (EMLA) cream Apply to the Left areola and cover with plastic wrap one hour prior to leaving for surgery. (Patient not taking: Reported on 05/07/2021) 5 g 0  . pantoprazole (PROTONIX) 40 MG tablet Take 1 tablet (40 mg total) by mouth daily. 30 tablet 1   No current facility-administered medications for this visit.    OBJECTIVE: Vitals:   05/07/21 1342  BP: 103/66  Pulse: 76  Resp: 18  Temp: 98.1 F (36.7 C)  SpO2: 96%     Body mass index is 39.59 kg/m.    ECOG FS:0 - Asymptomatic  General: Well-developed, well-nourished, no acute distress. Eyes: Pink conjunctiva, anicteric sclera. HEENT: Normocephalic, moist mucous membranes. Breasts: Exam deferred today. Lungs: No audible wheezing or coughing. Heart: Regular rate and  rhythm. Abdomen: Soft, nontender, no obvious distention. Musculoskeletal: No edema, cyanosis, or clubbing. Neuro: Alert, answering all questions appropriately. Cranial nerves grossly intact. Skin: No rashes or petechiae noted. Psych: Normal affect. Lymphatics: No cervical, calvicular, axillary or inguinal LAD.   LAB RESULTS:  Lab Results  Component Value Date   NA 137 08/23/2017   K 3.7 08/23/2017   CL 102 08/23/2017   CO2 27 08/23/2017   GLUCOSE 162 (H) 08/23/2017   BUN 18 08/23/2017   CREATININE 0.54 08/23/2017   CALCIUM 9.2 08/23/2017   PROT 7.4 08/23/2017   ALBUMIN 4.2 08/23/2017   AST 23 08/23/2017   ALT 19 08/23/2017   ALKPHOS 58 08/23/2017   BILITOT 0.5 08/23/2017   GFRNONAA >60 08/23/2017   GFRAA >60 08/23/2017    Lab Results  Component Value Date   WBC 6.5 08/23/2017  HGB 11.6 (L) 08/23/2017   HCT 34.1 (L) 08/23/2017   MCV 86.1 08/23/2017   PLT 200 08/23/2017     STUDIES: US BREAST LTD UNI LEFT INC AXILLA  Result Date: 04/18/2021 CLINICAL DATA:  70 year old female presenting as a recall from screening for possible left breast mass. EXAM: DIGITAL DIAGNOSTIC UNILATERAL LEFT MAMMOGRAM WITH TOMOSYNTHESIS AND CAD; ULTRASOUND LEFT BREAST LIMITED TECHNIQUE: Left digital diagnostic mammography and breast tomosynthesis was performed. The images were evaluated with computer-aided detection.; Targeted ultrasound examination of the left breast was performed COMPARISON:  Previous exam(s). ACR Breast Density Category b: There are scattered areas of fibroglandular density. FINDINGS: Mammogram: Full field exaggerated lateral cc and mL tomosynthesis as well as spot compression MLO tomosynthesis views of the left breast were performed. There is persistence of a small irregular mass in the far upper outer left breast posterior depth measuring approximately 0.5 cm. On physical exam of the upper-outer left breast I do not feel a definite discrete mass. Ultrasound: Targeted ultrasound  performed in the left breast at 2 o'clock 10 cm from the nipple demonstrating an irregular hypoechoic mass measuring 0.6 x 0.6 x 0.4 cm. This corresponds to the mass identified mammographically. Targeted ultrasound of the left axilla demonstrates normal lymph nodes. IMPRESSION: Suspicious 0.6 cm mass in the left breast at 2 o'clock. RECOMMENDATION: Ultrasound-guided core needle biopsy of the left breast mass. I have discussed the findings and recommendations with the patient who agree to proceed with biopsy. The patient will be contacted by our scheduler to arrange the biopsy appointment. BI-RADS CATEGORY  4: Suspicious. Electronically Signed   By: Audie Pinto M.D.   On: 04/18/2021 10:31   MM DIAG BREAST TOMO UNI LEFT  Result Date: 04/18/2021 CLINICAL DATA:  70 year old female presenting as a recall from screening for possible left breast mass. EXAM: DIGITAL DIAGNOSTIC UNILATERAL LEFT MAMMOGRAM WITH TOMOSYNTHESIS AND CAD; ULTRASOUND LEFT BREAST LIMITED TECHNIQUE: Left digital diagnostic mammography and breast tomosynthesis was performed. The images were evaluated with computer-aided detection.; Targeted ultrasound examination of the left breast was performed COMPARISON:  Previous exam(s). ACR Breast Density Category b: There are scattered areas of fibroglandular density. FINDINGS: Mammogram: Full field exaggerated lateral cc and mL tomosynthesis as well as spot compression MLO tomosynthesis views of the left breast were performed. There is persistence of a small irregular mass in the far upper outer left breast posterior depth measuring approximately 0.5 cm. On physical exam of the upper-outer left breast I do not feel a definite discrete mass. Ultrasound: Targeted ultrasound performed in the left breast at 2 o'clock 10 cm from the nipple demonstrating an irregular hypoechoic mass measuring 0.6 x 0.6 x 0.4 cm. This corresponds to the mass identified mammographically. Targeted ultrasound of the left axilla  demonstrates normal lymph nodes. IMPRESSION: Suspicious 0.6 cm mass in the left breast at 2 o'clock. RECOMMENDATION: Ultrasound-guided core needle biopsy of the left breast mass. I have discussed the findings and recommendations with the patient who agree to proceed with biopsy. The patient will be contacted by our scheduler to arrange the biopsy appointment. BI-RADS CATEGORY  4: Suspicious. Electronically Signed   By: Audie Pinto M.D.   On: 04/18/2021 10:31   MM 3D SCREEN BREAST BILATERAL  Result Date: 04/09/2021 CLINICAL DATA:  Screening. EXAM: DIGITAL SCREENING BILATERAL MAMMOGRAM WITH TOMOSYNTHESIS AND CAD TECHNIQUE: Bilateral screening digital craniocaudal and mediolateral oblique mammograms were obtained. Bilateral screening digital breast tomosynthesis was performed. The images were evaluated with computer-aided detection. COMPARISON:  Previous exam(s). ACR  Breast Density Category b: There are scattered areas of fibroglandular density. FINDINGS: In the left breast, a possible mass warrants further evaluation. This possible mass is seen within the upper LEFT breast, at posterior depth, MLO view only, slice 47. In the right breast, no findings suspicious for malignancy. IMPRESSION: Further evaluation is suggested for a possible mass in the left breast. RECOMMENDATION: Diagnostic mammogram and possibly ultrasound of the left breast. (Code:[id number]-L-[id number]) The patient will be contacted regarding the findings, and additional imaging will be scheduled. BI-RADS CATEGORY  0: Incomplete. Need additional imaging evaluation and/or prior mammograms for comparison. Electronically Signed   By: Franki Cabot M.D.   On: 04/09/2021 13:50   MM CLIP PLACEMENT LEFT  Result Date: 04/25/2021 CLINICAL DATA:  Post biopsy mammogram of the left breast for clip placement. EXAM: DIAGNOSTIC LEFT MAMMOGRAM POST ULTRASOUND BIOPSY COMPARISON:  Previous exam(s). FINDINGS: Mammographic images were obtained following ultrasound guided  biopsy of a mass in the left breast at 2 o'clock. The biopsy marking clip is in expected position at the site of biopsy. IMPRESSION: Appropriate positioning of the X shaped biopsy marking clip at the site of biopsy in the upper-outer left breast. Final Assessment: Post Procedure Mammograms for Marker Placement Electronically Signed   By: Ammie Ferrier M.D.   On: 04/25/2021 13:43   [place] LT BREAST BX W LOC DEV 1ST LESION IMG BX SPEC US GUIDE  Addendum Date: 04/30/2021   ADDENDUM REPORT: 04/30/2021 13:13 ADDENDUM: PATHOLOGY revealed: A. LEFT BREAST; ULTRASOUND-GUIDED BIOPSY: - INVASIVE MAMMARY CARCINOMA WITH TUBULAR FEATURES. Size of invasive carcinoma: 4 mm in this sample. Grade 1. Ductal carcinoma in situ: Not identified. Lymphovascular invasion: Not identified. Comment: The definitive grade will be assigned on the excisional specimen. Pathology results are CONCORDANT with imaging findings, per Dr. Ammie Ferrier. Pathology results and recommendations were discussed with patient via telephone on 11/09/2019. Patient reported doing well after the biopsy with no adverse symptoms, only tenderness at the site. Post biopsy care instructions were reviewed, questions were answered and my direct phone number was provided. Patient was encouraged to call [facility] for any additional questions or concerns related to biopsy site. Recommend surgical consultation: Request for surgical consultation relayed to Al Pimple RN and Tanya Nones RN at [facility] by Electa Sniff RN on 04/29/2021. Pathology results reported by Electa Sniff RN on 04/30/2021. Electronically Signed   By: Ammie Ferrier M.D.   On: 04/30/2021 13:13   Result Date: 04/30/2021 CLINICAL DATA:  70 year old female presenting for ultrasound-guided biopsy of a left breast mass. EXAM: ULTRASOUND GUIDED LEFT BREAST CORE NEEDLE BIOPSY COMPARISON:  Previous exam(s). PROCEDURE: I met with the patient and we discussed the procedure  of ultrasound-guided biopsy, including benefits and alternatives. We discussed the high likelihood of a successful procedure. We discussed the risks of the procedure, including infection, bleeding, tissue injury, clip migration, and inadequate sampling. Informed written consent was given. The usual time-out protocol was performed immediately prior to the procedure. Lesion quadrant: Upper outer quadrant Using sterile technique and 1% Lidocaine as local anesthetic, under direct ultrasound visualization, a 14 gauge spring-loaded device was used to perform biopsy of a mass in the left breast at 2 o'clock using a lateral approach. At the conclusion of the procedure an X shaped tissue marker clip was deployed into the biopsy cavity. Follow up 2 view mammogram was performed and dictated separately. IMPRESSION: Ultrasound guided biopsy of a left breast mass at 2 o'clock. No apparent complications. Electronically Signed: By:  Ammie Ferrier M.D. 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  PLAN:    1. 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  Chemotherapy will likely be unnecessary, but given stage of disease, will send Oncotype DX score for completeness. Finally, given the ER/PR status of her malignancy she will benefit from letrozole for a total of 5 years.  Patient will follow-up in approximately 2 weeks after her surgery for further evaluation and discussion of her final pathology results.  She will also have consultation with radiation oncology to discuss adjuvant XRT.     I spent a total of 60 minutes reviewing chart data, face-to-face evaluation with the patient, counseling and coordination of care as detailed  above.   Patient expressed understanding and was in agreement with this plan. She also understands that She can call clinic at any time with any questions, concerns, or complaints.   Cancer Staging Carcinoma of upper-outer quadrant of female breast, left [facility]) Staging form: Breast, AJCC 8th Edition - Clinical stage from 05/02/2021: Stage IA (cT1b, cN0, cM0, G1, ER+, PR+, HER2-) - Signed by Lloyd Huger, MD on 05/07/2021 Histologic grading system: 3 grade system   Lloyd Huger, MD   05/07/2021 4:44 PM

## 2021-05-06 ENCOUNTER — Other Ambulatory Visit: Payer: Self-pay

## 2021-05-06 ENCOUNTER — Ambulatory Visit: Payer: Medicare HMO

## 2021-05-06 DIAGNOSIS — N3946 Mixed incontinence: Secondary | ICD-10-CM

## 2021-05-06 DIAGNOSIS — R3915 Urgency of urination: Secondary | ICD-10-CM

## 2021-05-06 NOTE — Patient Instructions (Signed)
Tracking Your Bladder Symptoms    Patient Name:___________________________________________________   Sample: Day   Daytime Voids  Nighttime Voids Urgency for the Day(0-4) Number of Accidents Beverage Comments  Monday IIII II 2 I Water IIII Coffee  I      Week Starting:____________________________________   Day Daytime  Voids Nighttime  Voids Urgency for  The Day(0-4) Number of Accidents Beverages Comments                                                           This week my symptoms were:  O much better  O better O the same O worse   

## 2021-05-06 NOTE — Progress Notes (Addendum)
PTNS  Session # 9  Health & Social Factors: Pt recently diagnosed with breast cancer and may not be able to continue PTNS, will keep [place] updated.  Caffeine: 1 Alcohol: 0 Daytime voids #per day: 7-8 Night-time voids #per night: 2 Urgency: Strong  Incontinence Episodes #per day: 2 Ankle used: Right Treatment Setting: 18 Feeling/ Response: Sensory & Toe Flex  Comments: Pt continues to be discouraged by lack of improvement in urinary symptoms.   Performed By: Sherril Cong, CMA   Follow Up: RTC in 1 week for PTNS

## 2021-05-07 ENCOUNTER — Other Ambulatory Visit: Payer: Self-pay

## 2021-05-07 ENCOUNTER — Other Ambulatory Visit: Payer: Self-pay | Admitting: Surgery

## 2021-05-07 ENCOUNTER — Encounter: Payer: Self-pay | Admitting: Surgery

## 2021-05-07 ENCOUNTER — Ambulatory Visit: Payer: Medicare HMO | Admitting: Surgery

## 2021-05-07 ENCOUNTER — Encounter: Payer: Self-pay | Admitting: Oncology

## 2021-05-07 ENCOUNTER — Ambulatory Visit: Payer: Self-pay | Admitting: Surgery

## 2021-05-07 ENCOUNTER — Inpatient Hospital Stay: Payer: Medicare HMO

## 2021-05-07 ENCOUNTER — Inpatient Hospital Stay: Payer: Medicare HMO | Attending: Oncology | Admitting: Oncology

## 2021-05-07 VITALS — BP 111/59 | HR 72 | Temp 98.9°F | Ht 65.0 in | Wt 235.0 lb

## 2021-05-07 DIAGNOSIS — Z853 Personal history of malignant neoplasm of breast: Secondary | ICD-10-CM

## 2021-05-07 DIAGNOSIS — Z800 Family history of malignant neoplasm of digestive organs: Secondary | ICD-10-CM | POA: Diagnosis not present

## 2021-05-07 DIAGNOSIS — C50412 Malignant neoplasm of upper-outer quadrant of left female breast: Secondary | ICD-10-CM | POA: Diagnosis not present

## 2021-05-07 DIAGNOSIS — Z170 Estrogen receptor positive status [ER+]: Secondary | ICD-10-CM | POA: Insufficient documentation

## 2021-05-07 MED ORDER — LIDOCAINE-PRILOCAINE 2.5-2.5 % EX CREA: TOPICAL_CREAM | CUTANEOUS | 0 refills | Status: DC

## 2021-05-07 NOTE — H&P (View-Only) (Signed)
Patient ID: Rhonda Thornton, female   DOB: 1951/11/27, 70 y.o.   MRN: [id number]  Chief Complaint: Left breast cancer  History of Present Illness Rhonda Thornton is a 70 y.o. female with screening mammogram that revealed a lesion of concern.  This was identified in [place] image guided biopsy obtained, confirming invasive mammary carcinoma ER/PR positive, HER2 negative.  She had a prior breast biopsy 4 years ago for benign breast cyst.  She has no history of birth control or hormone therapy.  She underwent a partial hysterectomy, 1991.  She has no known family history of breast cancer.  She underwent menses at the age of 65, and was never pregnant.  She never felt a lump, had nipple discharge nor had noticed any skin changes.  Past Medical History Past Medical History:  Diagnosis Date  . Anemia   . Anginal pain ([place])   . Arthritis   . Diabetes mellitus without complication ([place])   . Edema of left lower extremity   . GERD (gastroesophageal reflux disease)   . Hyperlipidemia   . Hypertension   . Sleep apnea   . Tubular adenoma of colon       Past Surgical History:  Procedure Laterality Date  . ABDOMINAL HYSTERECTOMY  1991   partial  . BREAST BIOPSY Left 0505/2022   u/s bx-"X" clip-path pending  . BREAST CYST ASPIRATION Right 07/16/2016   neg  . CHOLECYSTECTOMY    . COLONOSCOPY WITH PROPOFOL N/A 07/30/2016   Procedure: COLONOSCOPY WITH PROPOFOL;  Surgeon: Lollie Sails, MD;  Location: [facility] ENDOSCOPY;  Service: Endoscopy;  Laterality: N/A;  . ESOPHAGOGASTRODUODENOSCOPY (EGD) WITH PROPOFOL N/A 09/09/2017   Procedure: ESOPHAGOGASTRODUODENOSCOPY (EGD) WITH PROPOFOL;  Surgeon: Tol[place], Benay Pike, MD;  Location: [facility] ENDOSCOPY;  Service: Endoscopy;  Laterality: N/A;  . EYE SURGERY    . FUNCTIONAL ENDOSCOPIC SINUS SURGERY    . TONSILLECTOMY      No Known Allergies  Current Outpatient Medications  Medication Sig Dispense Refill  . amLODipine (NORVASC) 5 MG tablet Take 5 mg by mouth daily.     Marland Kitchen aspirin 325 MG EC tablet Take 325 mg by mouth 2 (two) times daily.    Marland Kitchen atorvastatin (LIPITOR) 80 MG tablet Take 80 mg by mouth daily.    . calcium carbonate (TUMS - DOSED IN MG ELEMENTAL CALCIUM) 500 MG chewable tablet Chew 1 tablet by mouth as needed for indigestion or heartburn.    . cetirizine (ZYRTEC) 10 MG tablet Take 10 mg by mouth daily.    . dorzolamide-timolol (COSOPT) 22.3-6.8 MG/ML ophthalmic solution     . fluticasone (FLONASE) 50 MCG/ACT nasal spray USE 2 SPRAYS IN EACH NOSTRIL DAILY FOR ALLERGIES    . glipiZIDE (GLUCOTROL) 5 MG tablet Take by mouth daily before breakfast.    . HYDROcodone-acetaminophen (NORCO) 7.5-325 MG tablet Take 1 tablet by mouth 2 (two) times daily.    Marland Kitchen ibuprofen (ADVIL,MOTRIN) 200 MG tablet Take 200 mg by mouth every 8 (eight) hours as needed.    . latanoprost (XALATAN) 0.005 % ophthalmic solution     . lidocaine-prilocaine (EMLA) cream Apply to the Left areola and cover with plastic wrap one hour prior to leaving for surgery. 5 g 0  . lisinopril (PRINIVIL,ZESTRIL) 10 MG tablet Take 10 mg by mouth daily.    . metFORMIN (GLUCOPHAGE) 500 MG tablet Take 500 mg by mouth 4 (four) times daily.    . Multiple Vitamin (MULTIVITAMIN) capsule Take 1 capsule by mouth daily.    Marland Kitchen  phenylephrine (SUDAFED PE) 10 MG TABS tablet Take 10 mg by mouth every 4 (four) hours as needed.    . pregabalin (LYRICA) 100 MG capsule Take 100 mg by mouth 3 (three) times daily.    . sertraline (ZOLOFT) 100 MG tablet Take 100 mg by mouth daily. Take 1 1/2 tablet a day    . tolterodine (DETROL LA) 4 MG 24 hr capsule Take 4 mg by mouth daily.    . pantoprazole (PROTONIX) 40 MG tablet Take 1 tablet (40 mg total) by mouth daily. 30 tablet 1   No current facility-administered medications for this visit.    Family History Family History  Problem Relation Age of Onset  . Stomach cancer Mother   . Colon cancer Brother   . Breast cancer Neg Hx       Social History Social History    Tobacco Use  . Smoking status: Never Smoker  . Smokeless tobacco: Never Used  Vaping Use  . Vaping Use: Never used  Substance Use Topics  . Alcohol use: No  . Drug use: No        Review of Systems  Constitutional: Negative.   HENT: Negative.   Eyes: Negative.   Respiratory: Negative.   Cardiovascular: Positive for leg swelling.  Gastrointestinal: Positive for heartburn.  Genitourinary: Positive for frequency and urgency.  Skin: Negative.   Neurological: Negative.   Psychiatric/Behavioral: Positive for depression.      Physical Exam Blood pressure (!) 111/59, pulse 72, temperature 98.9 F (37.2 C), height $RemoveBe'5\' 5"'YNymymTdR$  (1.651 m), weight 235 lb (106.6 kg), SpO2 93 %. Last Weight  Most recent update: 05/07/2021 11:22 AM   Weight  106.6 kg (235 lb)            CONSTITUTIONAL: Well developed, and nourished, frail appearing female with walking assistance device present with her, otherwise appropriately responsive and aware without distress.  Somewhat dyspneic with transition to exam table. EYES: Sclera non-icteric.   EARS, NOSE, MOUTH AND THROAT: Mask worn.    Hearing is intact to voice.  NECK: Trachea is midline, and there is no jugular venous distension.  LYMPH NODES:  Lymph nodes in the neck are not enlarged. RESPIRATORY:  Lungs are clear, and breath sounds are equal bilaterally. Normal respiratory effort without pathologic use of accessory muscles. CARDIOVASCULAR: Heart is regular in rate and rhythm. GI: The abdomen is soft, nontender, and nondistended. GU: There are no appreciable breast masses or nodularity noted.  There is no evidence of skin dimpling or dermal changes present.  No appreciable axillary adenopathy MUSCULOSKELETAL:  Symmetrical muscle tone appreciated in all four extremities.    SKIN: Skin turgor is normal. No pathologic skin lesions appreciated.  NEUROLOGIC:  Motor and sensation appear grossly normal.  Cranial nerves are grossly without defect. PSYCH:  Alert  and oriented to person, place and time. Affect is appropriate for situation.  Data Reviewed I have personally reviewed what is currently available of the patient's imaging, recent labs and medical records.   Labs:  CBC Latest Ref Rng & Units 08/23/2017  WBC 3.6 - 11.0 K/uL 6.5  Hemoglobin 12.0 - 16.0 g/dL 11.6(L)  Hematocrit 35.0 - 47.0 % 34.1(L)  Platelets 150 - 440 K/uL 200   CMP Latest Ref Rng & Units 08/23/2017  Glucose 65 - 99 mg/dL 162(H)  BUN 6 - 20 mg/dL 18  Creatinine 0.44 - 1.00 mg/dL 0.54  Sodium 135 - 145 mmol/L 137  Potassium 3.5 - 5.1 mmol/L 3.7  Chloride 101 -  111 mmol/L 102  CO2 22 - 32 mmol/L 27  Calcium 8.9 - 10.3 mg/dL 9.2  Total Protein 6.5 - 8.1 g/dL 7.4  Total Bilirubin 0.3 - 1.2 mg/dL 0.5  Alkaline Phos 38 - 126 U/L 58  AST 15 - 41 U/L 23  ALT 14 - 54 U/L 19    SURGICAL PATHOLOGY  * THIS IS AN ADDENDUM REPORT *  CASE: [id number]  PATIENT: Lucas Jewkes  Surgical Pathology Report  *Addendum *  Reason for Addendum #1: Breast Biomarker Results   Specimen Submitted:  A. Breast, left   Clinical History: X clip within upper outer quadrant left breast mass   DIAGNOSIS:  A. LEFT BREAST; ULTRASOUND-GUIDED BIOPSY:  - INVASIVE MAMMARY CARCINOMA WITH TUBULAR FEATURES.   Size of invasive carcinoma: 4 mm in this sample  Histologic grade of invasive carcinoma: Grade 1            Glandular/tubular differentiation score: 1            Nuclear pleomorphism score: 2            Mitotic rate score: 1            Total score: 4  Ductal carcinoma in situ: Not identified  Lymphovascular invasion: Not identified   Comment:  The definitive grade will be assigned on the excisional specimen.  ER/PR/HER2 immunohistochemistry will be performed on block A1, with  reflex to [place] for HER2 2+.The results will be reported in an addendum.   IHC slides were prepared by Launa Grill, [place]. All controls stained  appropriately.    This test was developed and its performance characteristics determined  by [facility]. It has not been cleared or approved by the [place] Food and Drug  Administration. The FDA does not require this test to go through  premarket FDA review. This test is used for clinical purposes. It should  not be regarded as investigational or for research. This laboratory is  certified under the Clinical Laboratory Improvement Amendments (CLIA) as  qualified to perform high complexity clinical laboratory testing.   GROSS DESCRIPTION:  A. Labeled: Left breast biopsy 2:00 10 cm from nipple  Received: Formalin  Time/date in fixative: Collected and placed in formalin at 1:23 PM on  04/25/2021  Cold ischemic time: Less than 1 minute  Total fixation time: 6.5 hours  Core pieces: 4 cores  Size: Range from 1-1.5 cm in length and 0.2 cm in diameter  Description: Received are cores of yellow fibrofatty tissue with focal  areas of hemorrhage.  Ink color: Black  Entirely submitted in cassettes 1-2 with 2 cores per cassette.   RB 04/25/2021   Final Diagnosis performed by Betsy Pries, MD.  Electronically signed  04/29/2021 11:06:14AM  The electronic signature indicates that the named Attending Pathologist  has evaluated the specimen  Technical component performed at [facility], [address] [place],  [place] [address] Lab: [phone] Dir: Rush Farmer, MD, MMM  Professional component performed at [facility], [facility], [place], [place], [place] [address] Lab: [phone]  Dir: Dellia Nims. Rubinas, MD   ADDENDUM:  CASE SUMMARY: BREAST BIOMARKER TESTS  TEST(S) PERFORMED:  Estrogen Receptor (ER) Status: POSITIVE      Percentage of cells with nuclear positivity: Greater than 90%      Average intensity of staining: Strong  Progesterone Receptor (PgR) Status: POSITIVE      Percentage of cells with nuclear positivity: Greater than 90%      Average intensity of  staining:  Moderate  HER2 (by immunohistochemistry): NEGATIVE (Score 0)    Imaging: Radiology review:   CLINICAL DATA:  70 year old female presenting as a recall from screening for possible left breast mass. EXAM: DIGITAL DIAGNOSTIC UNILATERAL LEFT MAMMOGRAM WITH TOMOSYNTHESIS AND CAD; ULTRASOUND LEFT BREAST LIMITED  TECHNIQUE: Left digital diagnostic mammography and breast tomosynthesis was performed. The images were evaluated with computer-aided detection.; Targeted ultrasound examination of the left breast was performed  COMPARISON:  Previous exam(s).  ACR Breast Density Category b: There are scattered areas of fibroglandular density.  FINDINGS: Mammogram:  Full field exaggerated lateral cc and mL tomosynthesis as well as spot compression MLO tomosynthesis views of the left breast were performed. There is persistence of a small irregular mass in the far upper outer left breast posterior depth measuring approximately 0.5 cm. On physical exam of the upper-outer left breast I do not feel a definite discrete mass.  Ultrasound: Targeted ultrasound performed in the left breast at 2 o'clock 10 cm from the nipple demonstrating an irregular hypoechoic mass measuring 0.6 x 0.6 x 0.4 cm. This corresponds to the mass identified mammographically. Targeted ultrasound of the left axilla demonstrates normal lymph nodes.  IMPRESSION: Suspicious 0.6 cm mass in the left breast at 2 o'clock.  RECOMMENDATION: Ultrasound-guided core needle biopsy of the left breast mass.  I have discussed the findings and recommendations with the patient who agree to proceed with biopsy. The patient will be contacted by our scheduler to arrange the biopsy appointment.  BI-RADS CATEGORY  4: Suspicious.  Electronically Signed   By: Audie Pinto M.D.   On: 04/18/2021 10:31  Within last 24 hrs: No results found.  Assessment    Invasive mammary carcinoma left breast.  ER/PR positive, HER2  negative. Patient Active Problem List   Diagnosis Date Noted  . Carcinoma of upper-outer quadrant of female breast, left ([place]) 05/02/2021    Plan    I discussed the available options with the patient. The risk of recurrence is similar between mastectomy and lumpectomy with radiation.  I also discussed that given the small size of the cancer would recommend localization lumpectomy with probable radiation to follow.  I also discussed that we would need to do a sentinel lymph node biopsy to check the nodes.   Explained to the patient that after her surgical treatment additional treatment will depend on her stage.   And would likely require some form of estrogen blockade.  I discussed risks of bleeding, infection, damage to surrounding tissues, having positive margins, needing further resection, damage to nerves causing arm numbness or difficulty raising arm, causing lymphedema in the arm; as well as anesthesia risks of MI, stroke, prolonged ventilation, pulmonary embolism, thrombosis and even death.   Patient was given the opportunity to ask questions and have them answered.  They would like to proceed with left breast RFID localized lumpectomy with sentinel lymph node biopsy.   Face-to-face time spent with the patient and accompanying care providers(if present) was 30 minutes, with more than 50% of the time spent counseling, educating, and coordinating care of the patient.    These notes generated with voice recognition software. I apologize for typographical errors.  Ronny Bacon M.D., FACS 05/07/2021, 1:31 PM

## 2021-05-07 NOTE — Patient Instructions (Signed)
We have spoken today about removing a lump in your breast. This will be done by Dr. Christian Mate at [facility].  You will most likely be able to leave the hospital several hours after your surgery. Rarely, a patient needs to stay over night but this is a possibility.  Plan to tenatively be off work for 1-2 weeks following the surgery and may return with approximately 4 more weeks of a lifting restriction, no greater than 15 lbs.  Please see your Blue surgery sheet. Our surgery scheduler will call you to look at surgery dates and to go over information.  We have sent in a prescription for EMLA(Lidocaine) cream to be used the day of surgery. Instructions will be on your prescription.      Lumpectomy A lumpectomy is a form of "breast conserving" or "breast preservation" surgery. It may also be referred to as a partial mastectomy. During a lumpectomy, the portion of the breast that contains the cancerous tumor or breast mass (the lump) is removed. Some normal tissue around the lump may also be removed to make sure all of the tumor has been removed.  LET [facility] CARE PROVIDER KNOW ABOUT:  Any allergies you have.  All medicines you are taking, including vitamins, herbs, eye drops, creams, and over-the-counter medicines.  Previous problems you or members of your family have had with the use of anesthetics.  Any blood disorders you have.  Previous surgeries you have had.  Medical conditions you have. RISKS AND COMPLICATIONS Generally, this is a safe procedure. However, problems can occur and include:  Bleeding.  Infection.  Pain.  Temporary swelling.  Change in the shape of the breast, particularly if a large portion is removed. BEFORE THE PROCEDURE  Ask your health care provider about changing or stopping your regular medicines. This is especially important if you are taking diabetes medicines or blood thinners.  Do not eat or drink anything after midnight on the night before the  procedure or as directed by your health care provider. Ask your health care provider if you can take a sip of water with any approved medicines.  On the day of surgery, your health care provider will use a mammogram or ultrasound to locate and mark the tumor in your breast. These markings on your breast will show where the cut (incision) will be made. PROCEDURE   An IV tube will be put into one of your veins.  You may be given medicine to help you relax before the surgery (sedative). You will be given one of the following:  A medicine that numbs the area (local anesthetic).  A medicine that makes you fall asleep (general anesthetic).  Your health care provider will use a kind of electric scalpel that uses heat to minimize bleeding (electrocautery knife).  A curved incision (like a smile or frown) that follows the natural curve of your breast is made, to allow for minimal scarring and better healing.  The tumor will be removed with some of the surrounding tissue. This will be sent to the lab for analysis. Your health care provider may also remove your lymph nodes at this time if needed.  Sometimes, but not always, a rubber tube called a drain will be surgically inserted into your breast area or armpit to collect excess fluid that may accumulate in the space where the tumor was. This drain is connected to a plastic bulb on the outside of your body. This drain creates suction to help remove the fluid.  The  incisions will be closed with stitches (sutures).  A bandage may be placed over the incisions. AFTER THE PROCEDURE  You will be taken to the recovery area.  You will be given medicine for pain.  A small rubber drain may be placed in the breast for 2-3 days to prevent a collection of blood (hematoma) from developing in the breast. You will be given instructions on caring for the drain before you go home.  A pressure bandage (dressing) will be applied for 1-2 days to prevent bleeding.  Ask your health care provider how to care for your bandage at home.   This information is not intended to replace advice given to you by your health care provider. Make sure you discuss any questions you have with your health care provider.   Document Released: 01/19/2007 Document Revised: 12/29/2014 Document Reviewed: 05/13/2013 Elsevier Interactive Patient Education [employer].

## 2021-05-07 NOTE — Progress Notes (Signed)
Patient ID: Rhonda Thornton, female   DOB: 03-31-1951, 70 y.o.   MRN: [id number]  Chief Complaint: Left breast cancer  History of Present Illness Rhonda Thornton is a 70 y.o. female with screening mammogram that revealed a lesion of concern.  This was identified in [place] image guided biopsy obtained, confirming invasive mammary carcinoma ER/PR positive, HER2 negative.  She had a prior breast biopsy 4 years ago for benign breast cyst.  She has no history of birth control or hormone therapy.  She underwent a partial hysterectomy, 1991.  She has no known family history of breast cancer.  She underwent menses at the age of 62, and was never pregnant.  She never felt a lump, had nipple discharge nor had noticed any skin changes.  Past Medical History Past Medical History:  Diagnosis Date  . Anemia   . Anginal pain ([place])   . Arthritis   . Diabetes mellitus without complication ([place])   . Edema of left lower extremity   . GERD (gastroesophageal reflux disease)   . Hyperlipidemia   . Hypertension   . Sleep apnea   . Tubular adenoma of colon       Past Surgical History:  Procedure Laterality Date  . ABDOMINAL HYSTERECTOMY  1991   partial  . BREAST BIOPSY Left 0505/2022   u/s bx-"X" clip-path pending  . BREAST CYST ASPIRATION Right 07/16/2016   neg  . CHOLECYSTECTOMY    . COLONOSCOPY WITH PROPOFOL N/A 07/30/2016   Procedure: COLONOSCOPY WITH PROPOFOL;  Surgeon: Lollie Sails, MD;  Location: [facility] ENDOSCOPY;  Service: Endoscopy;  Laterality: N/A;  . ESOPHAGOGASTRODUODENOSCOPY (EGD) WITH PROPOFOL N/A 09/09/2017   Procedure: ESOPHAGOGASTRODUODENOSCOPY (EGD) WITH PROPOFOL;  Surgeon: Toledo, Benay Pike, MD;  Location: [facility] ENDOSCOPY;  Service: Endoscopy;  Laterality: N/A;  . EYE SURGERY    . FUNCTIONAL ENDOSCOPIC SINUS SURGERY    . TONSILLECTOMY      No Known Allergies  Current Outpatient Medications  Medication Sig Dispense Refill  . amLODipine (NORVASC) 5 MG tablet Take 5 mg by mouth daily.     Marland Kitchen aspirin 325 MG EC tablet Take 325 mg by mouth 2 (two) times daily.    Marland Kitchen atorvastatin (LIPITOR) 80 MG tablet Take 80 mg by mouth daily.    . calcium carbonate (TUMS - DOSED IN MG ELEMENTAL CALCIUM) 500 MG chewable tablet Chew 1 tablet by mouth as needed for indigestion or heartburn.    . cetirizine (ZYRTEC) 10 MG tablet Take 10 mg by mouth daily.    . dorzolamide-timolol (COSOPT) 22.3-6.8 MG/ML ophthalmic solution     . fluticasone (FLONASE) 50 MCG/ACT nasal spray USE 2 SPRAYS IN EACH NOSTRIL DAILY FOR ALLERGIES    . glipiZIDE (GLUCOTROL) 5 MG tablet Take by mouth daily before breakfast.    . HYDROcodone-acetaminophen (NORCO) 7.5-325 MG tablet Take 1 tablet by mouth 2 (two) times daily.    Marland Kitchen ibuprofen (ADVIL,MOTRIN) 200 MG tablet Take 200 mg by mouth every 8 (eight) hours as needed.    . latanoprost (XALATAN) 0.005 % ophthalmic solution     . lidocaine-prilocaine (EMLA) cream Apply to the Left areola and cover with plastic wrap one hour prior to leaving for surgery. 5 g 0  . lisinopril (PRINIVIL,ZESTRIL) 10 MG tablet Take 10 mg by mouth daily.    . metFORMIN (GLUCOPHAGE) 500 MG tablet Take 500 mg by mouth 4 (four) times daily.    . Multiple Vitamin (MULTIVITAMIN) capsule Take 1 capsule by mouth daily.    Marland Kitchen  phenylephrine (SUDAFED PE) 10 MG TABS tablet Take 10 mg by mouth every 4 (four) hours as needed.    . pregabalin (LYRICA) 100 MG capsule Take 100 mg by mouth 3 (three) times daily.    . sertraline (ZOLOFT) 100 MG tablet Take 100 mg by mouth daily. Take 1 1/2 tablet a day    . tolterodine (DETROL LA) 4 MG 24 hr capsule Take 4 mg by mouth daily.    . pantoprazole (PROTONIX) 40 MG tablet Take 1 tablet (40 mg total) by mouth daily. 30 tablet 1   No current facility-administered medications for this visit.    Family History Family History  Problem Relation Age of Onset  . Stomach cancer Mother   . Colon cancer Brother   . Breast cancer Neg Hx       Social History Social History    Tobacco Use  . Smoking status: Never Smoker  . Smokeless tobacco: Never Used  Vaping Use  . Vaping Use: Never used  Substance Use Topics  . Alcohol use: No  . Drug use: No        Review of Systems  Constitutional: Negative.   HENT: Negative.   Eyes: Negative.   Respiratory: Negative.   Cardiovascular: Positive for leg swelling.  Gastrointestinal: Positive for heartburn.  Genitourinary: Positive for frequency and urgency.  Skin: Negative.   Neurological: Negative.   Psychiatric/Behavioral: Positive for depression.      Physical Exam Blood pressure (!) 111/59, pulse 72, temperature 98.9 F (37.2 C), height $RemoveBe'5\' 5"'QEjBQOwHI$  (1.651 m), weight 235 lb (106.6 kg), SpO2 93 %. Last Weight  Most recent update: 05/07/2021 11:22 AM   Weight  106.6 kg (235 lb)            CONSTITUTIONAL: Well developed, and nourished, frail appearing female with walking assistance device present with her, otherwise appropriately responsive and aware without distress.  Somewhat dyspneic with transition to exam table. EYES: Sclera non-icteric.   EARS, NOSE, MOUTH AND THROAT: Mask worn.    Hearing is intact to voice.  NECK: Trachea is midline, and there is no jugular venous distension.  LYMPH NODES:  Lymph nodes in the neck are not enlarged. RESPIRATORY:  Lungs are clear, and breath sounds are equal bilaterally. Normal respiratory effort without pathologic use of accessory muscles. CARDIOVASCULAR: Heart is regular in rate and rhythm. GI: The abdomen is soft, nontender, and nondistended. GU: There are no appreciable breast masses or nodularity noted.  There is no evidence of skin dimpling or dermal changes present.  No appreciable axillary adenopathy MUSCULOSKELETAL:  Symmetrical muscle tone appreciated in all four extremities.    SKIN: Skin turgor is normal. No pathologic skin lesions appreciated.  NEUROLOGIC:  Motor and sensation appear grossly normal.  Cranial nerves are grossly without defect. PSYCH:  Alert  and oriented to person, place and time. Affect is appropriate for situation.  Data Reviewed I have personally reviewed what is currently available of the patient's imaging, recent labs and medical records.   Labs:  CBC Latest Ref Rng & Units 08/23/2017  WBC 3.6 - 11.0 K/uL 6.5  Hemoglobin 12.0 - 16.0 g/dL 11.6(L)  Hematocrit 35.0 - 47.0 % 34.1(L)  Platelets 150 - 440 K/uL 200   CMP Latest Ref Rng & Units 08/23/2017  Glucose 65 - 99 mg/dL 162(H)  BUN 6 - 20 mg/dL 18  Creatinine 0.44 - 1.00 mg/dL 0.54  Sodium 135 - 145 mmol/L 137  Potassium 3.5 - 5.1 mmol/L 3.7  Chloride 101 -  111 mmol/L 102  CO2 22 - 32 mmol/L 27  Calcium 8.9 - 10.3 mg/dL 9.2  Total Protein 6.5 - 8.1 g/dL 7.4  Total Bilirubin 0.3 - 1.2 mg/dL 0.5  Alkaline Phos 38 - 126 U/L 58  AST 15 - 41 U/L 23  ALT 14 - 54 U/L 19    SURGICAL PATHOLOGY  * THIS IS AN ADDENDUM REPORT *  CASE: [id number]  PATIENT: Corona Rampy  Surgical Pathology Report  *Addendum *  Reason for Addendum #1: Breast Biomarker Results   Specimen Submitted:  A. Breast, left   Clinical History: X clip within upper outer quadrant left breast mass   DIAGNOSIS:  A. LEFT BREAST; ULTRASOUND-GUIDED BIOPSY:  - INVASIVE MAMMARY CARCINOMA WITH TUBULAR FEATURES.   Size of invasive carcinoma: 4 mm in this sample  Histologic grade of invasive carcinoma: Grade 1            Glandular/tubular differentiation score: 1            Nuclear pleomorphism score: 2            Mitotic rate score: 1            Total score: 4  Ductal carcinoma in situ: Not identified  Lymphovascular invasion: Not identified   Comment:  The definitive grade will be assigned on the excisional specimen.  ER/PR/HER2 immunohistochemistry will be performed on block A1, with  reflex to [place] for HER2 2+.The results will be reported in an addendum.   IHC slides were prepared by Launa Grill, [place]. All controls stained  appropriately.    This test was developed and its performance characteristics determined  by [facility]. It has not been cleared or approved by the [place] Food and Drug  Administration. The FDA does not require this test to go through  premarket FDA review. This test is used for clinical purposes. It should  not be regarded as investigational or for research. This laboratory is  certified under the Clinical Laboratory Improvement Amendments (CLIA) as  qualified to perform high complexity clinical laboratory testing.   GROSS DESCRIPTION:  A. Labeled: Left breast biopsy 2:00 10 cm from nipple  Received: Formalin  Time/date in fixative: Collected and placed in formalin at 1:23 PM on  04/25/2021  Cold ischemic time: Less than 1 minute  Total fixation time: 6.5 hours  Core pieces: 4 cores  Size: Range from 1-1.5 cm in length and 0.2 cm in diameter  Description: Received are cores of yellow fibrofatty tissue with focal  areas of hemorrhage.  Ink color: Black  Entirely submitted in cassettes 1-2 with 2 cores per cassette.   RB 04/25/2021   Final Diagnosis performed by Betsy Pries, MD.  Electronically signed  04/29/2021 11:06:14AM  The electronic signature indicates that the named Attending Pathologist  has evaluated the specimen  Technical component performed at [facility], [address], [place],  [place] [address] Lab: [phone] Dir: Rush Farmer, MD, MMM  Professional component performed at [facility], [facility], [place], [place], [place] [address] Lab: [phone]  Dir: Dellia Nims. Rubinas, MD   ADDENDUM:  CASE SUMMARY: BREAST BIOMARKER TESTS  TEST(S) PERFORMED:  Estrogen Receptor (ER) Status: POSITIVE      Percentage of cells with nuclear positivity: Greater than 90%      Average intensity of staining: Strong  Progesterone Receptor (PgR) Status: POSITIVE      Percentage of cells with nuclear positivity: Greater than 90%      Average intensity of  staining:  Moderate  HER2 (by immunohistochemistry): NEGATIVE (Score 0)    Imaging: Radiology review:   CLINICAL DATA:  70 year old female presenting as a recall from screening for possible left breast mass. EXAM: DIGITAL DIAGNOSTIC UNILATERAL LEFT MAMMOGRAM WITH TOMOSYNTHESIS AND CAD; ULTRASOUND LEFT BREAST LIMITED  TECHNIQUE: Left digital diagnostic mammography and breast tomosynthesis was performed. The images were evaluated with computer-aided detection.; Targeted ultrasound examination of the left breast was performed  COMPARISON:  Previous exam(s).  ACR Breast Density Category b: There are scattered areas of fibroglandular density.  FINDINGS: Mammogram:  Full field exaggerated lateral cc and mL tomosynthesis as well as spot compression MLO tomosynthesis views of the left breast were performed. There is persistence of a small irregular mass in the far upper outer left breast posterior depth measuring approximately 0.5 cm. On physical exam of the upper-outer left breast I do not feel a definite discrete mass.  Ultrasound: Targeted ultrasound performed in the left breast at 2 o'clock 10 cm from the nipple demonstrating an irregular hypoechoic mass measuring 0.6 x 0.6 x 0.4 cm. This corresponds to the mass identified mammographically. Targeted ultrasound of the left axilla demonstrates normal lymph nodes.  IMPRESSION: Suspicious 0.6 cm mass in the left breast at 2 o'clock.  RECOMMENDATION: Ultrasound-guided core needle biopsy of the left breast mass.  I have discussed the findings and recommendations with the patient who agree to proceed with biopsy. The patient will be contacted by our scheduler to arrange the biopsy appointment.  BI-RADS CATEGORY  4: Suspicious.  Electronically Signed   By: Audie Pinto M.D.   On: 04/18/2021 10:31  Within last 24 hrs: No results found.  Assessment    Invasive mammary carcinoma left breast.  ER/PR positive, HER2  negative. Patient Active Problem List   Diagnosis Date Noted  . Carcinoma of upper-outer quadrant of female breast, left ([place]) 05/02/2021    Plan    I discussed the available options with the patient. The risk of recurrence is similar between mastectomy and lumpectomy with radiation.  I also discussed that given the small size of the cancer would recommend localization lumpectomy with probable radiation to follow.  I also discussed that we would need to do a sentinel lymph node biopsy to check the nodes.   Explained to the patient that after her surgical treatment additional treatment will depend on her stage.   And would likely require some form of estrogen blockade.  I discussed risks of bleeding, infection, damage to surrounding tissues, having positive margins, needing further resection, damage to nerves causing arm numbness or difficulty raising arm, causing lymphedema in the arm; as well as anesthesia risks of MI, stroke, prolonged ventilation, pulmonary embolism, thrombosis and even death.   Patient was given the opportunity to ask questions and have them answered.  They would like to proceed with left breast RFID localized lumpectomy with sentinel lymph node biopsy.   Face-to-face time spent with the patient and accompanying care providers(if present) was 30 minutes, with more than 50% of the time spent counseling, educating, and coordinating care of the patient.    These notes generated with voice recognition software. I apologize for typographical errors.  Ronny Bacon M.D., FACS 05/07/2021, 1:31 PM

## 2021-05-08 ENCOUNTER — Ambulatory Visit: Payer: Self-pay | Admitting: Surgery

## 2021-05-09 ENCOUNTER — Telehealth: Payer: Self-pay | Admitting: Surgery

## 2021-05-09 NOTE — Telephone Encounter (Signed)
Outgoing call is made, left message for patient to call.  Please advise patient of the following:   Patient has been advised of Pre-Admission date/time, COVID Testing date and Surgery date.  Surgery Date: 05/22/21 arrive @ 11:30 am for SLN bx to be done first prior to her surgery same day.   Preadmission Testing Date: 05/15/21 (phone 8a-1p) Covid Testing Date: Not needed.    Since patient is having SLN bx done first prior to her same day surgery on 05/22/21 her arrival time needs to be at 11:30 am.

## 2021-05-09 NOTE — Progress Notes (Signed)
Discussed consults with patient.  Prepared Care Plan for patient to pick up with Breast Cancer treatment Handbook.  She will pick up on Monday 05/13/21.  Lumpectomy scheduled for 05/22/21.  Reviewed use of EMLA cream prior to sentinel node injection.

## 2021-05-09 NOTE — Telephone Encounter (Signed)
Incoming call from patient.  She is aware of all dates regarding her surgery and verbalized understanding.

## 2021-05-13 ENCOUNTER — Ambulatory Visit (INDEPENDENT_AMBULATORY_CARE_PROVIDER_SITE_OTHER): Payer: Medicare HMO | Admitting: Family Medicine

## 2021-05-13 ENCOUNTER — Other Ambulatory Visit: Payer: Self-pay

## 2021-05-13 ENCOUNTER — Other Ambulatory Visit: Payer: Self-pay | Admitting: Surgery

## 2021-05-13 ENCOUNTER — Ambulatory Visit
Admission: RE | Admit: 2021-05-13 | Discharge: 2021-05-13 | Disposition: A | Discharge status: Home / Self Care | Payer: Medicare HMO | Source: Ambulatory Visit | Attending: Surgery | Admitting: Surgery

## 2021-05-13 DIAGNOSIS — N3946 Mixed incontinence: Secondary | ICD-10-CM

## 2021-05-13 DIAGNOSIS — Z853 Personal history of malignant neoplasm of breast: Secondary | ICD-10-CM

## 2021-05-13 NOTE — Progress Notes (Signed)
PTNS  Session # 10  Health & Social Factors: Patient is having breast surgery and will cancell the last 2 PTNS and call to reschedule  Caffeine: 1 Alcohol: 0 Daytime voids #per day: 7-8 Night-time voids #per night: 2 Urgency: mild Incontinence Episodes #per day: 2 Ankle used: right Treatment Setting: 13 Feeling/ Response: sensory Comments: Patient tolerated well  Performed By: Elberta Leatherwood, CMA  Follow Up: Will call to schedule the last 2.

## 2021-05-15 ENCOUNTER — Other Ambulatory Visit
Admission: RE | Admit: 2021-05-15 | Discharge: 2021-05-15 | Disposition: A | Discharge status: Home / Self Care | Payer: Medicare HMO | Source: Ambulatory Visit | Attending: Surgery | Admitting: Surgery

## 2021-05-15 ENCOUNTER — Other Ambulatory Visit: Payer: Self-pay

## 2021-05-15 HISTORY — DX: Cardiac murmur, unspecified: R01.1

## 2021-05-15 HISTORY — DX: Other intervertebral disc degeneration, lumbar region: M51.36

## 2021-05-15 HISTORY — DX: Other intervertebral disc degeneration, lumbar region without mention of lumbar back pain or lower extremity pain: M51.369

## 2021-05-15 HISTORY — DX: Personal history of other diseases of the nervous system and sense organs: Z86.69

## 2021-05-15 HISTORY — DX: Morbid (severe) obesity due to excess calories: E66.01

## 2021-05-15 NOTE — Patient Instructions (Addendum)
Your procedure is scheduled on: Wednesday, June 1  Report to the Registration Desk on the 1st floor of the [facility] at 11:30 am for sentinel lymph node biopsy to be done first prior to surgery.  REMEMBER: Instructions that are not followed completely may result in serious medical risk, up to and including death; or upon the discretion of your surgeon and anesthesiologist your surgery may need to be rescheduled.  Do not eat or drink anything after midnight the night before surgery.  No gum chewing, lozengers or hard candies.  TAKE THESE MEDICATIONS THE MORNING OF SURGERY WITH A SIP OF WATER:  1.  Amlodipine 2.  Atorvastatin 3.  cosopt eye drops 4.  Pantoprazole - (take one the night before and one on the morning of surgery - helps to prevent nausea after surgery.) 5.  Sertraline 6.  tolterodine  Stop Metformin 2 days prior to surgery. Last day to take is Sunday, May 29; resume AFTER surgery.  One week prior to surgery: starting May 25 Stop aspirin and Anti-inflammatories (NSAIDS) such as Advil, Aleve, Ibuprofen, Motrin, Naproxen, Naprosyn and Aspirin based products such as Excedrin, Goodys Powder, BC Powder. Stop ANY OVER THE COUNTER supplements until after surgery. You may however, continue to take Tylenol if needed for pain up until the day of surgery.  No Alcohol for 24 hours before or after surgery.  On the morning of surgery brush your teeth with toothpaste and water, you may rinse your mouth with mouthwash if you wish. Do not swallow any toothpaste or mouthwash.  Do not wear jewelry, make-up, hairpins, clips or nail polish.  Do not wear lotions, powders, or perfumes.   Do not shave body from the neck down 48 hours prior to surgery just in case you cut yourself which could leave a site for infection.  Also, freshly shaved skin may become irritated if using the CHG soap.  Contact lenses, hearing aids and dentures may not be worn into surgery.  Do not bring valuables to  the hospital. [facility] is not responsible for any missing/lost belongings or valuables.   Use CHG Soap as directed on instruction sheet.  Notify your doctor if there is any change in your medical condition (cold, fever, infection).  Wear comfortable clothing (specific to your surgery type) to the hospital.  Plan for stool softeners for home use; pain medications have a tendency to cause constipation. You can also help prevent constipation by eating foods high in fiber such as fruits and vegetables and drinking plenty of fluids as your diet allows.  After surgery, you can help prevent lung complications by doing breathing exercises.  Take deep breaths and cough every 1-2 hours. Your doctor may order a device called an Incentive Spirometer to help you take deep breaths.  If you are being discharged the day of surgery, you will not be allowed to drive home. You will need a responsible adult (18 years or older) to drive you home and stay with you that night.   If you are taking public transportation, you will need to have a responsible adult (18 years or older) with you. Please confirm with your physician that it is acceptable to use public transportation.   Please call the [facility] at [phone] if you have any questions about these instructions.  Surgery Visitation Policy:  Patients undergoing a surgery or procedure may have one family member or support person with them as long as that person is not COVID-19 positive or experiencing  its symptoms.  That person may remain in the waiting area during the procedure.

## 2021-05-16 ENCOUNTER — Encounter
Admission: RE | Admit: 2021-05-16 | Discharge: 2021-05-16 | Disposition: A | Discharge status: Home / Self Care | Payer: Medicare HMO | Source: Ambulatory Visit | Attending: Surgery | Admitting: Surgery

## 2021-05-16 ENCOUNTER — Encounter: Payer: Self-pay | Admitting: Surgery

## 2021-05-16 DIAGNOSIS — Z01818 Encounter for other preprocedural examination: Secondary | ICD-10-CM | POA: Insufficient documentation

## 2021-05-16 DIAGNOSIS — I10 Essential (primary) hypertension: Secondary | ICD-10-CM | POA: Insufficient documentation

## 2021-05-16 DIAGNOSIS — E119 Type 2 diabetes mellitus without complications: Secondary | ICD-10-CM | POA: Insufficient documentation

## 2021-05-16 LAB — CBC WITH DIFFERENTIAL/PLATELET
Abs Immature Granulocytes: 0.05 10*3/uL (ref 0.00–0.07)
Basophils Absolute: 0 10*3/uL (ref 0.0–0.1)
Basophils Relative: 1 %
Eosinophils Absolute: 0.2 10*3/uL (ref 0.0–0.5)
Eosinophils Relative: 3 %
HCT: 34.8 % — ABNORMAL LOW (ref 36.0–46.0)
Hemoglobin: 11.2 g/dL — ABNORMAL LOW (ref 12.0–15.0)
Immature Granulocytes: 1 %
Lymphocytes Relative: 22 %
Lymphs Abs: 1.5 10*3/uL (ref 0.7–4.0)
MCH: 29.3 pg (ref 26.0–34.0)
MCHC: 32.2 g/dL (ref 30.0–36.0)
MCV: 91.1 fL (ref 80.0–100.0)
Monocytes Absolute: 0.5 10*3/uL (ref 0.1–1.0)
Monocytes Relative: 8 %
Neutro Abs: 4.2 10*3/uL (ref 1.7–7.7)
Neutrophils Relative %: 65 %
Platelets: 184 10*3/uL (ref 150–400)
RBC: 3.82 MIL/uL — ABNORMAL LOW (ref 3.87–5.11)
RDW: 14.3 % (ref 11.5–15.5)
WBC: 6.5 10*3/uL (ref 4.0–10.5)
nRBC: 0 % (ref 0.0–0.2)

## 2021-05-16 LAB — COMPREHENSIVE METABOLIC PANEL
ALT: 17 U/L (ref 0–44)
AST: 18 U/L (ref 15–41)
Albumin: 4.1 g/dL (ref 3.5–5.0)
Alkaline Phosphatase: 62 U/L (ref 38–126)
Anion gap: 8 (ref 5–15)
BUN: 17 mg/dL (ref 8–23)
CO2: 25 mmol/L (ref 22–32)
Calcium: 8.9 mg/dL (ref 8.9–10.3)
Chloride: 101 mmol/L (ref 98–111)
Creatinine, Ser: 0.53 mg/dL (ref 0.44–1.00)
GFR, Estimated: >60 mL/min (ref >60)
Glucose, Bld: 203 mg/dL — ABNORMAL HIGH (ref 70–99)
Potassium: 4.4 mmol/L (ref 3.5–5.1)
Sodium: 134 mmol/L — ABNORMAL LOW (ref 135–145)
Total Bilirubin: 0.7 mg/dL (ref 0.3–1.2)
Total Protein: 7.3 g/dL (ref 6.5–8.1)

## 2021-05-16 NOTE — Progress Notes (Signed)
Perioperative Services  Pre-Admission/Anesthesia Testing Clinical Review  Date: 05/16/21  Patient Demographics:  Name: Rhonda Thornton DOB:   Nov 19, 1951 MRN:   [id number]  Planned Surgical Procedure(s):    Case: [id number] Date/Time: 05/22/21 1248   Procedure: BREAST LUMPECTOMY,RADIO FREQ LOCALIZER,AXILLARY SENTINEL LYMPH NODE BIOPSY (Left )   Anesthesia type: General   Pre-op diagnosis: left breast cancer   Location: [facility] OR ROOM 04 / [facility]   Surgeons: Ronny Bacon, MD    NOTE: Available PAT nursing documentation and vital signs have been reviewed. Clinical nursing staff has updated patient's PMH/PSHx, current medication list, and drug allergies/intolerances to ensure comprehensive history available to assist in medical decision making as it pertains to the aforementioned surgical procedure and anticipated anesthetic course.   Clinical Discussion:  Rhonda Thornton is a 70 y.o. female who is submitted for pre-surgical anesthesia review and clearance prior to her undergoing the above procedure. Patient has never been a smoker. Pertinent PMH includes: angina, cardiac murmur, mild valvular insufficiency, mild aortic stenosis, HTN, HLD, T2DM, OSAH (requires nocturnal PAP therapy), requires nocturnal supplemental oxygen), seizure disorder, GERD (uses calcium carbonate), anemia, OA, lumbar DDD, chronic pain syndrome, chronic opioid use, LEFT breast cancer.  Extensive review of available clinical information performed. [facility] and P[facility] updated with any diagnoses/procedures that  may have been inadvertently omitted during her intake with the pre-admission testing department's nursing staff.  Patient is followed by cardiology Clayborn Bigness, MD). She was last seen in the cardiology clinic on 04/01/2021; notes reviewed.  At the time of her clinic visit, patient doing well overall from a cardiovascular perspective.  She presented for a "checkup" of her heart due to her history  of cardiac murmur.  Patient denies any chest pain, shortness breath, PND, orthopnea, palpitations, vertiginous symptoms, or presyncope/syncope.  Patient on chronic supplemental oxygen at bedtime (2L/[place]).  Patient with intermittent bilateral lower extremity edema, however patient admitting to high sodium diet.  Patient elevating legs at home to mitigate symptoms.  Last TTE performed in 07/2016 revealed normal left ventricular systolic function with moderate LVH (LVEF 55%), mild mitral and tricuspid valve regurgitation, and mild aortic valve stenosis (mean gradient 15.0 mmHg). Patient underwent myocardial perfusion imaging study in 07/2016 that revealed no evidence of stress-induced myocardial ischemia or arrhythmia; LVEF 61% (see full interpretation of cardiovascular testing below).  Patient on GDMT for her HTN and HLD diagnoses.  Blood pressure well controlled at 124/72 on currently prescribed CCB monotherapy.  Patient is on a statin for HLD.  T2DM moderately controlled per patient report; no recent Hgb A1c for review. Functional capacity, as defined by DASI, is documented as being >/= 4 METS.  Given patient's current symptom constellation and length of time since her last noninvasive cardiovascular studies, the decision was made to pursue repeat myocardial perfusion imaging study and TTE.  Patient follow-up with outpatient cardiology in 1 month to review results of noninvasive testing.  Patient is scheduled for a lumpectomy on 05/22/2021 with Dr. Ronny Bacon.  Given patient's past medical history significant for cardiovascular diagnoses, presurgical cardiac clearance was sought by the PAT team. Per cardiology, "this patient is optimized for surgery and may proceed with the planned procedural course with a MODERATE risk stratification". Spoke with cardiology regarding pending noninvasive studies.  Per White, NP-C, ordered cardiovascular studies can be deferred until after breast procedure. This patient is on  daily antiplatelet therapy. She has been instructed on recommendations for holding her daily full dose ASA for  7 days prior to her procedure with plans to restart as soon as postoperative bleeding risk felt to be minimized by her attending surgeon. The patient has been instructed that her last dose of her anticoagulant will be on 05/14/2021.  Patient denies previous perioperative complications with anesthesia in the past. In review of the available records, it is noted that patient underwent a general anesthetic course here (ASA III) in 08/2017 without documented complications.   Vitals with BMI 05/15/2021 05/07/2021 05/07/2021  Height $Remov'5\' 5"'zniXvE$  $Remove'5\' 5"'ZNSgWmL$  $RemoveB'5\' 5"'IXvseBNW$   Weight 237 lbs 237 lbs 14 oz 235 lbs  BMI 39.44 48.25 00.37  Systolic - 048 889  Diastolic - 66 59  Pulse - 76 72    Providers/Specialists:   NOTE: Primary physician provider listed below. Patient may have been seen by APP or partner within same practice.   PROVIDER ROLE / SPECIALTY LAST Katy Apo, MD  General Surgery  05/08/2019  Denton Lank, MD  Primary Care Provider  09/27/2020  Katrine Coho, MD  Cardiology  04/01/2021  Delight Hoh, MD  Oncology  05/07/2021   Allergies:  Patient has no known allergies.  Current Home Medications:   No current facility-administered medications for this encounter.   Marland Kitchen alum & mag hydroxide-simeth (MAALOX/MYLANTA) 200-200-20 MG/5ML suspension  . amLODipine (NORVASC) 5 MG tablet  . aspirin 325 MG EC tablet  . atorvastatin (LIPITOR) 80 MG tablet  . calcium carbonate (TUMS - DOSED IN MG ELEMENTAL CALCIUM) 500 MG chewable tablet  . Carboxymethylcellulose Sodium (ARTIFICIAL TEARS OP)  . cetirizine (ZYRTEC) 10 MG tablet  . dorzolamide-timolol (COSOPT) 22.3-6.8 MG/ML ophthalmic solution  . fluticasone (FLONASE) 50 MCG/ACT nasal spray  . glipiZIDE (GLUCOTROL XL) 5 MG 24 hr tablet  . HYDROcodone-acetaminophen (NORCO) 7.5-325 MG tablet  . ibuprofen (ADVIL,MOTRIN) 200 MG tablet  .  latanoprost (XALATAN) 0.005 % ophthalmic solution  . lisinopril (PRINIVIL,ZESTRIL) 10 MG tablet  . metFORMIN (GLUCOPHAGE) 500 MG tablet  . Multiple Vitamin (DAILY-VITE MULTIVITAMIN) TABS  . Multiple Vitamin (MULTIVITAMIN) capsule  . phenylephrine (SUDAFED PE) 10 MG TABS tablet  . pregabalin (LYRICA) 100 MG capsule  . sertraline (ZOLOFT) 100 MG tablet  . tolterodine (DETROL LA) 4 MG 24 hr capsule  . lidocaine-prilocaine (EMLA) cream  . OXYGEN  . pantoprazole (PROTONIX) 40 MG tablet   History:   Past Medical History:  Diagnosis Date  . Anemia   . Anginal pain ([place])   . Arthritis   . Chronic pain syndrome   . Degenerative disc disease, lumbar   . Edema of left lower extremity   . Gastroparesis due to secondary diabetes ([place])   . GERD (gastroesophageal reflux disease)   . Heart murmur   . History of seizure disorder   . Hyperlipidemia   . Hypertension   . Invasive carcinoma of breast ([place]) 04/25/2021   LEFT; stage 1A; grade I invasive mammary carcinoma; ER/PR (+); HER2/neu (-)  . Mild aortic stenosis   . Mild mitral regurgitation   . Mild tricuspid regurgitation   . Morbid obesity ([place])   . Opioid use    therapeutic use for Dx of chronic pain syndrome  . OSA (obstructive sleep apnea)   . Recurrent falls while walking    uses rolling walker   . Requires supplemental oxygen    2L/[place] at bedtime  . T2DM (type 2 diabetes mellitus) ([place])   . Tubular adenoma of colon    Past Surgical History:  Procedure Laterality Date  . ABDOMINAL HYSTERECTOMY  1991  partial  . BREAST BIOPSY Left 0505/2022   u/s bx-"X" clip-INVASIVE MAMMARY CARCINOMA WITH TUBULAR FEATURES  . BREAST CYST ASPIRATION Right 07/16/2016   neg  . CHOLECYSTECTOMY    . COLONOSCOPY WITH PROPOFOL N/A 07/30/2016   Procedure: COLONOSCOPY WITH PROPOFOL;  Surgeon: Lollie Sails, MD;  [facility] END[facility];  Service: Endoscopy;  Laterality: N/A;  . ESOPHAGOGASTRODUODENOSCOPY (EGD) WITH PROPOFOL N/A 09/09/2017    Procedure: ESOPHAGOGASTRODUODENOSCOPY (EGD) WITH PROPOFOL;  Surgeon: [place], Benay Pike, MD;  Location: [facility] ENDOSCOPY;  Service: Endoscopy;  Laterality: N/A;  . EYE SURGERY Right    for pterygium  . FUNCTIONAL ENDOSCOPIC SINUS SURGERY    . TONSILLECTOMY     Family History  Problem Relation Age of Onset  . Stomach cancer Mother   . Colon cancer Brother   . Breast cancer Neg Hx    Social History   Tobacco Use  . Smoking status: Never Smoker  . Smokeless tobacco: Never Used  Vaping Use  . Vaping Use: Never used  Substance Use Topics  . Alcohol use: No  . Drug use: Never    Pertinent Clinical Results:  LABS: Labs reviewed: Acceptable for surgery.  Hospital Outpatient Visit on 05/16/2021  Component Date Value Ref Range Status  . WBC 05/16/2021 6.5  4.0 - 10.5 K/uL Final  . RBC 05/16/2021 3.82* 3.87 - 5.11 MIL/uL Final  . Hemoglobin 05/16/2021 11.2* 12.0 - 15.0 g/dL Final  . HCT 05/16/2021 34.8* 36.0 - 46.0 % Final  . MCV 05/16/2021 91.1  80.0 - 100.0 fL Final  . MCH 05/16/2021 29.3  26.0 - 34.0 pg Final  . MCHC 05/16/2021 32.2  30.0 - 36.0 g/dL Final  . RDW 05/16/2021 14.3  11.5 - 15.5 % Final  . Platelets 05/16/2021 184  150 - 400 K/uL Final  . nRBC 05/16/2021 0.0  0.0 - 0.2 % Final  . Neutrophils Relative % 05/16/2021 65  % Final  . Neutro Abs 05/16/2021 4.2  1.7 - 7.7 K/uL Final  . Lymphocytes Relative 05/16/2021 22  % Final  . Lymphs Abs 05/16/2021 1.5  0.7 - 4.0 K/uL Final  . Monocytes Relative 05/16/2021 8  % Final  . Monocytes Absolute 05/16/2021 0.5  0.1 - 1.0 K/uL Final  . Eosinophils Relative 05/16/2021 3  % Final  . Eosinophils Absolute 05/16/2021 0.2  0.0 - 0.5 K/uL Final  . Basophils Relative 05/16/2021 1  % Final  . Basophils Absolute 05/16/2021 0.0  0.0 - 0.1 K/uL Final  . Immature Granulocytes 05/16/2021 1  % Final  . Abs Immature Granulocytes 05/16/2021 0.05  0.00 - 0.07 K/uL Final   Performed at [facility], [address] [place],  [place] [address]  . Sodium 05/16/2021 134* 135 - 145 mmol/L Final  . Potassium 05/16/2021 4.4  3.5 - 5.1 mmol/L Final  . Chloride 05/16/2021 101  98 - 111 mmol/L Final  . CO2 05/16/2021 25  22 - 32 mmol/L Final  . Glucose, Bld 05/16/2021 203* 70 - 99 mg/dL Final   Glucose reference range applies only to samples taken after fasting for at least 8 hours.  . BUN 05/16/2021 17  8 - 23 mg/dL Final  . Creatinine, Ser 05/16/2021 0.53  0.44 - 1.00 mg/dL Final  . Calcium 05/16/2021 8.9  8.9 - 10.3 mg/dL Final  . Total Protein 05/16/2021 7.3  6.5 - 8.1 g/dL Final  . Albumin 05/16/2021 4.1  3.5 - 5.0 g/dL Final  . AST 05/16/2021 18  15 -  41 U/L Final  . ALT 05/16/2021 17  0 - 44 U/L Final  . Alkaline Phosphatase 05/16/2021 62  38 - 126 U/L Final  . Total Bilirubin 05/16/2021 0.7  0.3 - 1.2 mg/dL Final  . GFR, Estimated 05/16/2021 >60  >60 mL/min Final   Comment: (NOTE) Calculated using the CKD-EPI Creatinine Equation (2021)   . Anion gap 05/16/2021 8  5 - 15 Final   Performed at [facility], [address] [place], [place] [address]    ECG: Date: 04/01/2021 Rate: 66 bpm Rhythm: normal sinus Axis (leads I and aVF): Normal Intervals: PR 154 ms. QRS 76 ms. QTc 427 ms. ST segment and T wave changes: No evidence of acute ST segment elevation or depression; evidence of an age undetermined anterior infarct present Comparison: Similar to previous tracing obtained on 08/23/2017 NOTE: Tracing obtained at [facility]; unable for review. Above based on cardiologist's interpretation.   IMAGING / PROCEDURES: LEXISCAN performed on 07/24/2016 1. LVEF 61% 2. Regional wall motion reveals normal myocardial thickening and wall motion 3. No artifacts noted 4. Left ventricular cavity size normal 5. No evidence of stress-induced myocardial ischemia or arrhythmia 6. The overall quality of study is good  TRANSTHORACIC ECHOCARDIOGRAM performed on 07/24/2016 1. LVEF 55% 2. Normal left-ventricular  systolic function with moderate LVH 3. Normal right ventricular systolic function 4. Mild MR and TR 5. No AR or PR 6. Mild aortic valve stenosis with a mean gradient of 15.0 mmHg 7. No evidence of a pericardial effusion  Impression and Plan:  Rhonda Thornton has been referred for pre-anesthesia review and clearance prior to her undergoing the planned anesthetic and procedural courses. Available labs, pertinent testing, and imaging results were personally reviewed by me. This patient has been appropriately cleared by cardiology with an overall MODERATE risk of significant perioperative cardiovascular complications.  Based on clinical review performed today (05/16/21), barring any significant acute changes in the patient's overall condition, it is anticipated that she will be able to proceed with the planned surgical intervention. Any acute changes in clinical condition may necessitate her procedure being postponed and/or cancelled. Patient will meet with anesthesia team (MD and/or CRNA) on the day of her procedure for preoperative evaluation/assessment. Questions regarding anesthetic course will be fielded at that time.   Pre-surgical instructions were reviewed with the patient during her PAT appointment and questions were fielded by PAT clinical staff. Patient was advised that if any questions or concerns arise prior to her procedure then she should return a call to PAT and/or her surgeon's office to discuss.  Honor Loh, MSN, APRN, FNP-C, CEN [facility]  Peri-operative Services Nurse Practitioner Phone: [phone] 05/16/21 3:23 PM  NOTE: This note has been prepared using Dragon dictation software. Despite my best ability to proofread, there is always the potential that unintentional transcriptional errors may still occur from this process.

## 2021-05-21 ENCOUNTER — Ambulatory Visit: Payer: Self-pay

## 2021-05-22 ENCOUNTER — Ambulatory Visit
Admission: RE | Admit: 2021-05-22 | Discharge: 2021-05-22 | Disposition: A | Discharge status: Home / Self Care | Payer: Medicare HMO | Attending: Surgery | Admitting: Surgery

## 2021-05-22 ENCOUNTER — Encounter
Admission: RE | Disposition: A | Discharge status: Home / Self Care | Payer: Self-pay | Source: Home / Self Care | Attending: Surgery

## 2021-05-22 ENCOUNTER — Other Ambulatory Visit: Payer: Self-pay

## 2021-05-22 ENCOUNTER — Ambulatory Visit: Payer: Medicare HMO | Admitting: Urgent Care

## 2021-05-22 ENCOUNTER — Encounter: Payer: Self-pay | Admitting: Surgery

## 2021-05-22 ENCOUNTER — Ambulatory Visit
Admission: RE | Admit: 2021-05-22 | Discharge: 2021-05-22 | Disposition: A | Discharge status: Home / Self Care | Payer: Medicare HMO | Source: Ambulatory Visit | Attending: Surgery | Admitting: Surgery

## 2021-05-22 DIAGNOSIS — Z7984 Long term (current) use of oral hypoglycemic drugs: Secondary | ICD-10-CM | POA: Diagnosis not present

## 2021-05-22 DIAGNOSIS — K219 Gastro-esophageal reflux disease without esophagitis: Secondary | ICD-10-CM | POA: Insufficient documentation

## 2021-05-22 DIAGNOSIS — I10 Essential (primary) hypertension: Secondary | ICD-10-CM | POA: Insufficient documentation

## 2021-05-22 DIAGNOSIS — C50412 Malignant neoplasm of upper-outer quadrant of left female breast: Secondary | ICD-10-CM | POA: Diagnosis present

## 2021-05-22 DIAGNOSIS — Z800 Family history of malignant neoplasm of digestive organs: Secondary | ICD-10-CM | POA: Diagnosis not present

## 2021-05-22 DIAGNOSIS — E119 Type 2 diabetes mellitus without complications: Secondary | ICD-10-CM | POA: Diagnosis not present

## 2021-05-22 DIAGNOSIS — Z79899 Other long term (current) drug therapy: Secondary | ICD-10-CM | POA: Diagnosis not present

## 2021-05-22 DIAGNOSIS — Z170 Estrogen receptor positive status [ER+]: Secondary | ICD-10-CM | POA: Insufficient documentation

## 2021-05-22 DIAGNOSIS — Z7982 Long term (current) use of aspirin: Secondary | ICD-10-CM | POA: Diagnosis not present

## 2021-05-22 DIAGNOSIS — D0512 Intraductal carcinoma in situ of left breast: Secondary | ICD-10-CM | POA: Diagnosis not present

## 2021-05-22 DIAGNOSIS — Z853 Personal history of malignant neoplasm of breast: Secondary | ICD-10-CM

## 2021-05-22 DIAGNOSIS — Z791 Long term (current) use of non-steroidal anti-inflammatories (NSAID): Secondary | ICD-10-CM | POA: Insufficient documentation

## 2021-05-22 HISTORY — DX: Chronic pain syndrome: G89.4

## 2021-05-22 HISTORY — DX: Opioid use, unspecified, uncomplicated: F11.90

## 2021-05-22 HISTORY — DX: Dependence on supplemental oxygen: Z99.81

## 2021-05-22 HISTORY — DX: Type 2 diabetes mellitus without complications: E11.9

## 2021-05-22 HISTORY — DX: Nonrheumatic aortic (valve) stenosis: I35.0

## 2021-05-22 HISTORY — DX: Obstructive sleep apnea (adult) (pediatric): G47.33

## 2021-05-22 HISTORY — DX: Nonrheumatic mitral (valve) insufficiency: I34.0

## 2021-05-22 HISTORY — PX: BREAST LUMPECTOMY,RADIO FREQ LOCALIZER,AXILLARY SENTINEL LYMPH NODE BIOPSY: SHX6900

## 2021-05-22 HISTORY — DX: Rheumatic tricuspid insufficiency: I07.1

## 2021-05-22 HISTORY — DX: Other specified diabetes mellitus with diabetic autonomic (poly)neuropathy: E13.43

## 2021-05-22 HISTORY — DX: Repeated falls: R29.6

## 2021-05-22 LAB — GLUCOSE, CAPILLARY
Glucose-Capillary: 138 mg/dL — ABNORMAL HIGH (ref 70–99)
Glucose-Capillary: 165 mg/dL — ABNORMAL HIGH (ref 70–99)

## 2021-05-22 SURGERY — BREAST LUMPECTOMY,RADIO FREQ LOCALIZER,AXILLARY SENTINEL LYMPH NODE BIOPSY
Anesthesia: General | Laterality: Left

## 2021-05-22 MED ORDER — BUPIVACAINE LIPOSOME 1.3 % IJ SUSP
INTRAMUSCULAR | Status: AC
  Filled 2021-05-22: qty 20

## 2021-05-22 MED ORDER — FENTANYL CITRATE (PF) 100 MCG/2ML IJ SOLN
INTRAMUSCULAR | Status: AC
  Filled 2021-05-22: qty 2

## 2021-05-22 MED ORDER — ROCURONIUM BROMIDE 100 MG/10ML IV SOLN
INTRAVENOUS | Status: DC | PRN
  Administered 2021-05-22: 50 mg via INTRAVENOUS

## 2021-05-22 MED ORDER — SUGAMMADEX SODIUM 200 MG/2ML IV SOLN
INTRAVENOUS | Status: DC | PRN
  Administered 2021-05-22: 200 mg via INTRAVENOUS

## 2021-05-22 MED ORDER — EPHEDRINE SULFATE 50 MG/ML IJ SOLN
INTRAMUSCULAR | Status: DC | PRN
  Administered 2021-05-22 (×3): 10 mg via INTRAVENOUS

## 2021-05-22 MED ORDER — CHLORHEXIDINE GLUCONATE 0.12 % MT SOLN: 15.0000 mL | Freq: Once | OROMUCOSAL | Status: AC

## 2021-05-22 MED ORDER — ISOSULFAN BLUE 1 % ~~LOC~~ SOLN
SUBCUTANEOUS | Status: DC | PRN
  Administered 2021-05-22: 5 mg via SUBCUTANEOUS

## 2021-05-22 MED ORDER — CEFAZOLIN SODIUM-DEXTROSE 2-4 GM/100ML-% IV SOLN
INTRAVENOUS | Status: AC
  Filled 2021-05-22: qty 100

## 2021-05-22 MED ORDER — ACETAMINOPHEN 500 MG PO TABS: 1000.0000 mg | ORAL_TABLET | ORAL | Status: AC

## 2021-05-22 MED ORDER — DEXAMETHASONE SODIUM PHOSPHATE 10 MG/ML IJ SOLN
INTRAMUSCULAR | Status: DC | PRN
  Administered 2021-05-22: 5 mg via INTRAVENOUS

## 2021-05-22 MED ORDER — CHLORHEXIDINE GLUCONATE CLOTH 2 % EX PADS: 6.0000 | MEDICATED_PAD | Freq: Once | CUTANEOUS | Status: DC

## 2021-05-22 MED ORDER — CELECOXIB 200 MG PO CAPS: 200.0000 mg | ORAL_CAPSULE | ORAL | Status: AC

## 2021-05-22 MED ORDER — BUPIVACAINE LIPOSOME 1.3 % IJ SUSP: 20.0000 mL | Freq: Once | INTRAMUSCULAR | Status: DC

## 2021-05-22 MED ORDER — ACETAMINOPHEN 500 MG PO TABS
ORAL_TABLET | ORAL | Status: AC
  Administered 2021-05-22: 1000 mg via ORAL
  Filled 2021-05-22: qty 2

## 2021-05-22 MED ORDER — LIDOCAINE HCL (CARDIAC) PF 100 MG/5ML IV SOSY
PREFILLED_SYRINGE | INTRAVENOUS | Status: DC | PRN
  Administered 2021-05-22: 100 mg via INTRAVENOUS

## 2021-05-22 MED ORDER — PROPOFOL 10 MG/ML IV BOLUS
INTRAVENOUS | Status: DC | PRN
  Administered 2021-05-22: 150 mg via INTRAVENOUS

## 2021-05-22 MED ORDER — BUPIVACAINE-EPINEPHRINE (PF) 0.25% -1:200000 IJ SOLN
INTRAMUSCULAR | Status: DC | PRN
  Administered 2021-05-22: 20 mL

## 2021-05-22 MED ORDER — FENTANYL CITRATE (PF) 100 MCG/2ML IJ SOLN
INTRAMUSCULAR | Status: DC | PRN
  Administered 2021-05-22: 50 ug via INTRAVENOUS

## 2021-05-22 MED ORDER — TECHNETIUM TC 99M TILMANOCEPT KIT
1.0000 | PACK | Freq: Once | INTRAVENOUS | Status: AC | PRN
  Administered 2021-05-22: 1.1 via INTRADERMAL

## 2021-05-22 MED ORDER — SODIUM CHLORIDE 0.9 % IV SOLN: INTRAVENOUS | Status: DC

## 2021-05-22 MED ORDER — ISOSULFAN BLUE 1 % ~~LOC~~ SOLN
SUBCUTANEOUS | Status: AC
  Filled 2021-05-22: qty 5

## 2021-05-22 MED ORDER — PROPOFOL 10 MG/ML IV BOLUS
INTRAVENOUS | Status: AC
  Filled 2021-05-22: qty 20

## 2021-05-22 MED ORDER — CHLORHEXIDINE GLUCONATE 0.12 % MT SOLN
OROMUCOSAL | Status: AC
  Administered 2021-05-22: 15 mL via OROMUCOSAL
  Filled 2021-05-22: qty 15

## 2021-05-22 MED ORDER — ONDANSETRON HCL 4 MG/2ML IJ SOLN
INTRAMUSCULAR | Status: DC | PRN
  Administered 2021-05-22: 4 mg via INTRAVENOUS

## 2021-05-22 MED ORDER — HYDROCODONE-ACETAMINOPHEN 5-325 MG PO TABS: 1.0000 | ORAL_TABLET | Freq: Four times a day (QID) | ORAL | 0 refills | Status: DC | PRN

## 2021-05-22 MED ORDER — CHLORHEXIDINE GLUCONATE CLOTH 2 % EX PADS
6.0000 | MEDICATED_PAD | Freq: Once | CUTANEOUS | Status: DC
Start: 2021-05-22 — End: 2021-05-22

## 2021-05-22 MED ORDER — BUPIVACAINE-EPINEPHRINE (PF) 0.25% -1:200000 IJ SOLN
INTRAMUSCULAR | Status: AC
  Filled 2021-05-22: qty 30

## 2021-05-22 MED ORDER — CELECOXIB 200 MG PO CAPS
ORAL_CAPSULE | ORAL | Status: AC
  Administered 2021-05-22: 200 mg via ORAL
  Filled 2021-05-22: qty 1

## 2021-05-22 MED ORDER — MIDAZOLAM HCL 2 MG/2ML IJ SOLN
INTRAMUSCULAR | Status: AC
  Filled 2021-05-22: qty 2

## 2021-05-22 MED ORDER — ORAL CARE MOUTH RINSE: 15.0000 mL | Freq: Once | OROMUCOSAL | Status: AC

## 2021-05-22 MED ORDER — CEFAZOLIN SODIUM-DEXTROSE 2-4 GM/100ML-% IV SOLN
2.0000 g | INTRAVENOUS | Status: AC
  Administered 2021-05-22: 2 g via INTRAVENOUS

## 2021-05-22 SURGICAL SUPPLY — 42 items
ADH SKN CLS APL DERMABOND .7 (GAUZE/BANDAGES/DRESSINGS) ×1
APL PRP STRL LF DISP 70% ISPRP (MISCELLANEOUS) ×1
APPLIER CLIP 9.375 SM OPEN (CLIP)
APR CLP SM 9.3 20 MLT OPN (CLIP)
BLADE SURG 15 STRL LF DISP TIS (BLADE) ×1 IMPLANT
BLADE SURG 15 STRL SS (BLADE) ×2
CANISTER SUCT 1200ML W/VALVE (MISCELLANEOUS) ×2 IMPLANT
CHLORAPREP W/TINT 26 (MISCELLANEOUS) ×2 IMPLANT
CLIP APPLIE 9.375 SM OPEN (CLIP) IMPLANT
CNTNR SPEC 2.5X3XGRAD LEK (MISCELLANEOUS)
CONT SPEC 4OZ STER OR WHT (MISCELLANEOUS)
CONT SPEC 4OZ STRL OR WHT (MISCELLANEOUS)
CONTAINER SPEC 2.5X3XGRAD LEK (MISCELLANEOUS) IMPLANT
COVER WAND RF STERILE (DRAPES) ×2 IMPLANT
DECANTER SPIKE VIAL GLASS SM (MISCELLANEOUS) ×2 IMPLANT
DERMABOND ADVANCED (GAUZE/BANDAGES/DRESSINGS) ×1
DERMABOND ADVANCED .7 DNX12 (GAUZE/BANDAGES/DRESSINGS) ×1 IMPLANT
DEVICE DUBIN SPECIMEN MAMMOGRA (MISCELLANEOUS) ×2 IMPLANT
DRAPE LAPAROTOMY TRNSV 106X77 (MISCELLANEOUS) ×2 IMPLANT
ELECT CAUTERY BLADE TIP 2.5 (TIP) ×2
ELECT REM PT RETURN 9FT ADLT (ELECTROSURGICAL) ×2
ELECTRODE CAUTERY BLDE TIP 2.5 (TIP) ×1 IMPLANT
ELECTRODE REM PT RTRN 9FT ADLT (ELECTROSURGICAL) ×1 IMPLANT
GLOVE SURG ORTHO LTX SZ7.5 (GLOVE) ×6 IMPLANT
GOWN STRL REUS W/ TWL LRG LVL3 (GOWN DISPOSABLE) ×2 IMPLANT
GOWN STRL REUS W/TWL LRG LVL3 (GOWN DISPOSABLE) ×6
KIT MARKER MARGIN INK (KITS) ×1 IMPLANT
KIT TURNOVER KIT A (KITS) ×2 IMPLANT
MANIFOLD NEPTUNE II (INSTRUMENTS) ×2 IMPLANT
NEEDLE HYPO 22GX1.5 SAFETY (NEEDLE) ×2 IMPLANT
PACK BASIN MINOR ARMC (MISCELLANEOUS) ×2 IMPLANT
SET LOCALIZER 20 PROBE US (MISCELLANEOUS) ×2 IMPLANT
SET WALTER ACTIVATION W/DRAPE (SET/KITS/TRAYS/PACK) IMPLANT
SLEVE PROBE SENORX GAMMA FIND (MISCELLANEOUS) ×2 IMPLANT
SUT MNCRL 4-0 (SUTURE) ×2
SUT MNCRL 4-0 27XMFL (SUTURE) ×1
SUT VIC AB 3-0 SH 27 (SUTURE) ×2
SUT VIC AB 3-0 SH 27X BRD (SUTURE) ×1 IMPLANT
SUTURE MNCRL 4-0 27XMF (SUTURE) ×1 IMPLANT
SYR 10ML LL (SYRINGE) ×2 IMPLANT
SYR 20ML LL LF (SYRINGE) ×2 IMPLANT
WATER STERILE IRR 1000ML POUR (IV SOLUTION) ×2 IMPLANT

## 2021-05-22 NOTE — Op Note (Signed)
Pre-operative Diagnosis: Breast Cancer, Left.    Post-operative Diagnosis: Same  Surgeon: Ronny Bacon, M.D., [facility]  Anesthesia: General endotracheal  Procedure: Left lumpectomy, RFID tag directed, sentinel node biopsy  Procedure Details  The patient was seen again in the Holding Room. The benefits, complications, treatment options, and expected outcomes were discussed with the patient. The risks of bleeding, infection, recurrence of symptoms, failure to resolve symptoms, hematoma, seroma, open wound, cosmetic deformity, and the need for further surgery were discussed.  The patient was taken to Operating Room, identified as ARISA CON[place]TON and the procedure verified.  A Time Out was held and the above information confirmed.  Prior to the induction of general anesthesia, antibiotic prophylaxis was administered. VTE prophylaxis was in place. The patient was positioned in the supine position. Appropriate anesthesia was then administered and tolerated well. The LOCALizer is used to mark the skin for incision.  A visual dye  Isosulfan Blue 4-5 ml was injected periareolar dermis early under aseptic conditions.  Massage was administered to this area for 5 minutes prior to securely taping it.  The chest was prepped with Chloraprep and draped in the sterile fashion.  Then using the hand-held probe an area of high counts was identified in the axilla, an incision was made and direction by the probe aided in dissection of a lymph node which was sent for permanent section.  4 sentinel lymph nodes were removed, the initial 1 had counts over 25,000, additional ones included counts ranging from well over 4000-8000.  Only the first node with the highest count had blue dye in it.  There were no other pathologic lymph nodes present.  No other blue stained lymph nodes at the end of blue lymphatics.  Attention was turned to the RFID tag localization site where the of the same incision, Dissection using the LOCALizer  to perform a lumpectomy with adequate margins was performed. This was done with electrocautery. There was minimal bleeding, and the cavity packed.  The specimen was taken to the back table and painted to demarcate the 6 surfaces of potential margin.   I returned to the cavity to remove the packing, and hemostasis was confirmed with electrocautery.   Once assuring that hemostasis was adequate and checked multiple times the wound was closed with interrupted 3-0 Vicryl followed by 4-0 subcuticular Monocryl sutures.  Dermabond is utilized to seal the incision.  Local and depot injection with quarter percent Marcaine with epinephrine is utilized in the single incision.  Findings: Faxitron imaging: Both markers centrally within the specimen  Estimated Blood Loss: Minimal         Drains: None         Specimens: Upper outer/axillary left breast tissue, 4 left axillary sentinel lymph nodes.       Complications: None         Condition: Stable  Sentinel Node Biopsy Synoptic Operative Report  Operation performed with curative intent:Yes  Tracer(s) used to identify sentinel nodes in the upfront surgery (non-neoadjuvant) setting (select all that apply):Dye and Radioactive Tracer  Tracer(s) used to identify sentinel nodes in the neoadjuvant setting (select all that apply):N/A  All nodes (colored or non-colored) present at the end of a dye-filled lymphatic channel were removed:Yes   All significantly radioactive nodes were removed:Yes  All palpable suspicious nodes were removed:Yes  Biopsy-proven positive nodes marked with clips prior to chemotherapy were identified and removed:N/A    Ronny Bacon, M.D., [facility]  05/22/2021 ; 2:55 PM

## 2021-05-22 NOTE — Transfer of Care (Signed)
Immediate Anesthesia Transfer of Care Note  Patient: Rhonda Thornton  Procedure(s) Performed: BREAST LUMPECTOMY,RADIO FREQ LOCALIZER,AXILLARY SENTINEL LYMPH NODE BIOPSY (Left )  Patient Location: PACU  Anesthesia Type:General  Level of Consciousness: awake, alert  and oriented  Airway & Oxygen Therapy: Patient Spontanous Breathing and Patient connected to face mask oxygen  Post-op Assessment: Report given to RN and Post -op Vital signs reviewed and stable  Post vital signs: Reviewed and stable  Last Vitals:  Vitals Value Taken Time  BP 131/68 05/22/21 1458  Temp    Pulse 67 05/22/21 1500  Resp 19 05/22/21 1500  SpO2 99 % 05/22/21 1500  Vitals shown include unvalidated device data.  Last Pain:  Vitals:   05/22/21 1207  TempSrc: Temporal  PainSc: 0-No pain         Complications: No complications documented.

## 2021-05-22 NOTE — Discharge Instructions (Signed)

## 2021-05-22 NOTE — Interval H&P Note (Signed)
History and Physical Interval Note:  05/22/2021 12:48 PM  Rhonda Thornton  has presented today for surgery, with the diagnosis of left breast cancer.  The various methods of treatment have been discussed with the patient and family. After consideration of risks, benefits and other options for treatment, the patient has consented to  Procedure(s): [place] (Left) as a surgical intervention.  The patient's history has been reviewed, patient examined, no change in status, stable for surgery.  I have reviewed the patient's chart and labs.  Questions were answered to the patient's satisfaction.   Left is marked.   Ronny Bacon

## 2021-05-22 NOTE — Anesthesia Procedure Notes (Signed)
Procedure Name: Intubation Date/Time: 05/22/2021 1:33 PM Performed by: Philbert Riser, CRNA Pre-anesthesia Checklist: Patient identified, Emergency Drugs available, Suction available and Patient being monitored Patient Re-evaluated:Patient Re-evaluated prior to induction Oxygen Delivery Method: Circle system utilized Preoxygenation: Pre-oxygenation with 100% oxygen Induction Type: IV induction Ventilation: Mask ventilation without difficulty Laryngoscope Size: McGraph and 3 Tube type: Oral Tube size: 7.0 mm Number of attempts: 1 Airway Equipment and Method: Stylet and Oral airway Placement Confirmation: ETT inserted through vocal cords under direct vision,  positive ETCO2 and breath sounds checked- equal and bilateral Secured at: 22 cm Tube secured with: Tape Dental Injury: Teeth and Oropharynx as per pre-operative assessment

## 2021-05-22 NOTE — Anesthesia Preprocedure Evaluation (Signed)
Anesthesia Evaluation  Patient identified by MRN, date of birth, ID band Patient awake    Reviewed: Allergy & Precautions, NPO status , Patient's Chart, lab work & pertinent test results  History of Anesthesia Complications Negative for: history of anesthetic complications  Airway Mallampati: III  TM Distance: >3 FB Neck ROM: Full    Dental  (+) Poor Dentition, Missing   Pulmonary sleep apnea and Oxygen sleep apnea , neg COPD,    breath sounds clear to auscultation- rhonchi (-) wheezing      Cardiovascular hypertension, Pt. on medications (-) CAD, (-) Past MI, (-) Cardiac Stents and (-) CABG  Rhythm:Regular Rate:Normal - Systolic murmurs and - Diastolic murmurs    Neuro/Psych neg Seizures negative neurological ROS  negative psych ROS   GI/Hepatic Neg liver ROS, GERD  ,  Endo/Other  diabetes, Oral Hypoglycemic Agents  Renal/GU negative Renal ROS     Musculoskeletal  (+) Arthritis ,   Abdominal (+) + obese,   Peds  Hematology  (+) anemia ,   Anesthesia Other Findings Past Medical History: No date: Anemia No date: Anginal pain (HCC) No date: Arthritis No date: Chronic pain syndrome No date: Degenerative disc disease, lumbar No date: Edema of left lower extremity No date: Gastroparesis due to secondary diabetes (HCC) No date: GERD (gastroesophageal reflux disease) No date: Heart murmur No date: History of seizure disorder No date: Hyperlipidemia No date: Hypertension 04/25/2021: Invasive carcinoma of breast ([place])     Comment:  LEFT; stage 1A; grade I invasive mammary carcinoma;               ER/PR (+); HER2/neu (-) No date: Mild aortic stenosis No date: Mild mitral regurgitation No date: Mild tricuspid regurgitation No date: Morbid obesity (HCC) No date: Opioid use     Comment:  therapeutic use for Dx of chronic pain syndrome No date: OSA (obstructive sleep apnea) No date: Recurrent falls while walking      Comment:  uses rolling walker  No date: Requires supplemental oxygen     Comment:  2L/[place] at bedtime No date: T2DM (type 2 diabetes mellitus) ([place]) No date: Tubular adenoma of colon   Reproductive/Obstetrics                             Anesthesia Physical Anesthesia Plan  ASA: III  Anesthesia Plan: General   Post-op Pain Management:    Induction: Intravenous  PONV Risk Score and Plan: 2 and Ondansetron and Dexamethasone  Airway Management Planned: Oral ETT  Additional Equipment:   Intra-op Plan:   Post-operative Plan: Extubation in OR  Informed Consent: I have reviewed the patients History and Physical, chart, labs and discussed the procedure including the risks, benefits and alternatives for the proposed anesthesia with the patient or authorized representative who has indicated his/her understanding and acceptance.     Dental advisory given  Plan Discussed with: CRNA and Anesthesiologist  Anesthesia Plan Comments:         Anesthesia Quick Evaluation

## 2021-05-23 ENCOUNTER — Encounter: Payer: Self-pay | Admitting: Surgery

## 2021-05-24 NOTE — Anesthesia Postprocedure Evaluation (Signed)
Anesthesia Post Note  Patient: Rhonda Thornton  Procedure(s) Performed: BREAST LUMPECTOMY,RADIO FREQ LOCALIZER,AXILLARY SENTINEL LYMPH NODE BIOPSY (Left )  Patient location during evaluation: PACU Anesthesia Type: General Level of consciousness: awake and alert and oriented Pain management: pain level controlled Vital Signs Assessment: post-procedure vital signs reviewed and stable Respiratory status: spontaneous breathing, nonlabored ventilation and respiratory function stable Cardiovascular status: blood pressure returned to baseline and stable Postop Assessment: no signs of nausea or vomiting Anesthetic complications: no   No complications documented.   Last Vitals:  Vitals:   05/22/21 1548 05/22/21 1600  BP: 128/62 129/63  Pulse: 65 69  Resp: 16 18  Temp: (!) 35.8 C   SpO2: 94% 90%    Last Pain:  Vitals:   05/23/21 0834  TempSrc:   PainSc: 4                  [name]

## 2021-05-27 ENCOUNTER — Ambulatory Visit: Payer: Self-pay

## 2021-05-27 ENCOUNTER — Other Ambulatory Visit: Payer: Self-pay | Admitting: Pathology

## 2021-05-27 LAB — SURGICAL PATHOLOGY

## 2021-05-30 ENCOUNTER — Ambulatory Visit (INDEPENDENT_AMBULATORY_CARE_PROVIDER_SITE_OTHER): Payer: Medicare HMO | Admitting: Surgery

## 2021-05-30 ENCOUNTER — Encounter: Payer: Self-pay | Admitting: Surgery

## 2021-05-30 ENCOUNTER — Other Ambulatory Visit: Payer: Self-pay

## 2021-05-30 VITALS — BP 111/71 | HR 77 | Temp 99.2°F | Ht 65.0 in | Wt 237.0 lb

## 2021-05-30 DIAGNOSIS — C50412 Malignant neoplasm of upper-outer quadrant of left female breast: Secondary | ICD-10-CM

## 2021-05-30 NOTE — Progress Notes (Signed)
[facility] POST-OP OFFICE VISIT  05/30/2021  HPI: Rhonda Thornton is a 70 y.o. female 8 days s/p left breast lumpectomy with sentinel axillary lymph node biopsy.  She rates her pain about a 4.  She has some expected swelling.  She denies any nausea vomiting fever chills or constipation.  She denies any drainage from her incision.  Reviewed her pathology.  Her margins are adequate, she is node negative.  Vital signs: BP 111/71   Pulse 77   Temp 99.2 F (37.3 C) (Oral)   Ht 5\' 5"  (1.651 m)   Wt 237 lb (107.5 kg)   SpO2 93%   BMI 39.44 kg/m    Physical Exam: Constitutional: She appears at her baseline, wants to resume driving. Left breast: Dependent edema, ecchymosis of the inferior most breast and near the surgical site. Skin: Incision is clean dry and intact, there is no evidence of any erythema, induration.  Assessment/Plan: This is a 70 y.o. female 8 days s/p left breast lumpectomy with sentinel axillary lymph node biopsy.  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 Grayland Ormond regarding anticipated XRT, hormonal blockade, and follow-up imaging.  We will be glad to see her again within the next month should progress seem halted in her recovery.  Otherwise we will anticipate seeing her in about 6 months with a diagnostic left breast mammogram.   Ronny Bacon M.D., [facility] 05/30/2021, 1:44 PM

## 2021-05-30 NOTE — Patient Instructions (Addendum)
If you have any concerns or questions, please feel free to call our office.    Lumpectomy, Care After This sheet gives you information about how to care for yourself after your procedure. Your health care provider may also give you more specific instructions. If you have problems or questions, contact your health care provider. What can I expect after the procedure? After the procedure, it is common to have: Breast swelling. Breast tenderness. Stiffness in your arm or shoulder. A change in the shape and feel of your breast. Scar tissue that feels hard to the touch in the area where the lump was removed. Follow these instructions at home: Medicines Take over-the-counter and prescription medicines only as told by your health care provider. If you were prescribed an antibiotic medicine, take it as told by your health care provider. Do not stop taking the antibiotic even if you start to feel better. Ask your health care provider if the medicine prescribed to you: Requires you to avoid driving or using heavy machinery. Can cause constipation. You may need to take these actions to prevent or treat constipation: Drink enough fluid to keep your urine pale yellow. Take over-the-counter or prescription medicines. Eat foods that are high in fiber, such as beans, whole grains, and fresh fruits and vegetables. Limit foods that are high in fat and processed sugars, such as fried or sweet foods. Incision care Follow instructions from your health care provider about how to take care of your incision. Make sure you: Wash your hands with soap and water before and after you change your bandage (dressing). If soap and water are not available, use hand sanitizer. Change your dressing as told by your health care provider. Leave stitches (sutures), skin glue, or adhesive strips in place. These skin closures may need to stay in place for 2 weeks or longer. If adhesive strip edges start to loosen and curl up,  you may trim the loose edges. Do not remove adhesive strips completely unless your health care provider tells you to do that. Check your incision area every day for signs of infection. Check for: More redness, swelling, or pain. Fluid or blood. Warmth. Pus or a bad smell. Keep your dressing clean and dry. If you were sent home with a surgical drain in place, follow instructions from your health care provider about emptying it.      Bathing Do not take baths, swim, or use a hot tub until your health care provider approves. Ask your health care provider if you may take showers. You may only be allowed to take sponge baths. Activity Rest as told by your health care provider. Avoid sitting for a long time without moving. Get up to take short walks every 1-2 hours. This is important to improve blood flow and breathing. Ask for help if you feel weak or unsteady. Return to your normal activities as told by your health care provider. Ask your health care provider what activities are safe for you. Be careful to avoid any activities that could cause an injury to your arm on the side of your surgery. Do not lift anything that is heavier than 10 lb (4.5 kg), or the limit that you are told, until your health care provider says that it is safe. Avoid lifting with the arm that is on the side of your surgery. Do not carry heavy objects on your shoulder on the side of your surgery. Do exercises to keep your shoulder and arm from getting stiff and swollen. Talk  with your health care provider about which exercises are safe for you. General instructions Wear a supportive bra as told by your health care provider. Raise (elevate) your arm above the level of your heart while you are sitting or lying down. Do not wear tight jewelry on your arm, wrist, or fingers on the side of your surgery. Keep all follow-up visits as told by your health care provider. This is important. You may need to be screened for extra  fluid around the lymph nodes and swelling in the breast and arm (lymphedema). Follow instructions from your health care provider about how often you should be checked. If you had any lymph nodes removed during your procedure, be sure to tell all of your health care providers. This is important information to share before you are involved in certain procedures, such as having blood tests or having your blood pressure taken. Contact a health care provider if: You develop a rash. You have a fever. Your pain medicine is not working. You have swelling, weakness, or numbness in your arm that does not improve after a few weeks. You have new swelling in your breast. You have any of these signs of infection: More redness, swelling, or pain in your incision area. Fluid or blood coming from your incision. Warmth coming from the incision area. Pus or a bad smell coming from your incision. Get help right away if you have: Very bad pain in your breast or arm. Swelling in your legs or arms. Redness, warmth, or pain in your leg or arm. Chest pain. Difficulty breathing. Summary After the procedure, it is common to have breast tenderness, swelling in your breast, and stiffness in your arm and shoulder. Follow instructions from your health care provider about how to take care of your incision. Do not lift anything that is heavier than 10 lb (4.5 kg), or the limit that you are told, until your health care provider says that it is safe. Avoid lifting with the arm that is on the side of your surgery. If you had any lymph nodes removed during your procedure, be sure to tell all of your health care providers. This is important information to share before you are involved in certain procedures, such as having blood tests or having your blood pressure taken. This information is not intended to replace advice given to you by your health care provider. Make sure you discuss any questions you have with your health care  provider. Document Revised: 06/13/2019 Document Reviewed: 06/13/2019 Elsevier Patient Education  [place].   Via fracture

## 2021-05-31 NOTE — Progress Notes (Signed)
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  INTERVAL HISTORY: Patient returns to clinic today for further evaluation, discussion of her final pathology results, and treatment planning.  She tolerated her lumpectomy well without significant side effects.  She currently feels well and is asymptomatic. She has no neurologic complaints.  She denies any recent fevers or illnesses.  She has a good appetite and denies weight loss.  She has no chest pain, shortness of breath, cough, or hemoptysis.  She denies any nausea, vomiting, constipation, or diarrhea.  She has no urinary complaints.  Patient offers no specific complaints today.  REVIEW OF SYSTEMS:   Review of Systems  Constitutional: Negative.  Negative for fever, malaise/fatigue and weight loss.  Respiratory: Negative.  Negative for hemoptysis and shortness of breath.   Cardiovascular: Negative.  Negative for chest pain and leg swelling.  Gastrointestinal: Negative.  Negative for abdominal pain.  Genitourinary: Negative.  Negative for dysuria.  Musculoskeletal: Negative.  Negative for back pain.  Skin: Negative.  Negative for rash.  Neurological: Negative.  Negative for dizziness, focal weakness, weakness and headaches.  Psychiatric/Behavioral: Negative.  The patient is not nervous/anxious.    As per HPI. Otherwise, a complete review of systems is negative.  PAST MEDICAL HISTORY: Past Medical History:  Diagnosis Date   Anemia    Anginal pain (HCC)    Arthritis    Chronic pain syndrome    Degenerative disc disease, lumbar    Edema of left lower extremity    Gastroparesis due to  secondary diabetes (HCC)    GERD (gastroesophageal reflux disease)    Heart murmur    History of seizure disorder    Hyperlipidemia    Hypertension    Invasive carcinoma of breast ([place]) 04/25/2021   LEFT; stage 1A; grade I invasive mammary carcinoma; ER/PR (+); HER2/neu (-)   Mild aortic stenosis    Mild mitral regurgitation    Mild tricuspid regurgitation    Morbid obesity (HCC)    Opioid use    therapeutic use for Dx of chronic pain syndrome   OSA (obstructive sleep apnea)    Recurrent falls while walking    uses rolling walker    Requires supplemental oxygen    2L/[place] at bedtime   T2DM (type 2 diabetes mellitus) ([place])    Tubular adenoma of colon     PAST SURGICAL HISTORY: Past Surgical History:  Procedure Laterality Date   ABDOMINAL HYSTERECTOMY  1991   partial   BREAST BIOPSY Left 0505/2022   u/s bx-"X" clip-INVASIVE MAMMARY CARCINOMA WITH TUBULAR FEATURES   BREAST CYST ASPIRATION Right 07/16/2016   neg   BREAST LUMPECTOMY,RADIO FREQ LOCALIZER,AXILLARY SENTINEL LYMPH NODE BIOPSY Left 05/22/2021   Procedure: BREAST LUMPECTOMY,RADIO FREQ LOCALIZER,AXILLARY SENTINEL LYMPH NODE BIOPSY;  Surgeon: Ronny Bacon, MD;  Location: [facility] ORS;  Service: General;  Laterality: Left;   CHOLECYSTECTOMY     COLONOSCOPY WITH PROPOFOL N/A 07/30/2016   Procedure: COLONOSCOPY WITH PROPOFOL;  Surgeon: Lollie Sails, MD;  Location: [facility] ENDOSCOPY;  Service: Endoscopy;  Laterality: N/A;   ESOPHAGOGASTRODUODENOSCOPY (EGD) WITH PROPOFOL N/A 09/09/2017   Procedure: ESOPHAGOGASTRODUODENOSCOPY (EGD) WITH PROPOFOL;  Surgeon: Toledo, Benay Pike, MD;  Location: [facility] ENDOSCOPY;  Service: Endoscopy;  Laterality: N/A;   EYE SURGERY Right    for pterygium   FUNCTIONAL ENDOSCOPIC SINUS SURGERY     TONSILLECTOMY      FAMILY HISTORY: Family History  Problem Relation Age of Onset   Stomach cancer Mother    Colon cancer Brother    Breast cancer Neg Hx     ADVANCED DIRECTIVES (Y/N):  N  HEALTH  MAINTENANCE: Social History   Tobacco Use   Smoking status: Never   Smokeless tobacco: Never  Vaping Use   Vaping Use: Never used  Substance Use Topics   Alcohol use: No   Drug use: Never     Colonoscopy:  PAP:  Bone density:  Lipid panel:  No Known Allergies  Current Outpatient Medications  Medication Sig Dispense Refill   alum & mag hydroxide-simeth (MAALOX/MYLANTA) 200-200-20 MG/5ML suspension Take 30 mLs by mouth every 6 (six) hours as needed for indigestion or heartburn.     amLODipine (NORVASC) 5 MG tablet Take 5 mg by mouth daily.     aspirin 325 MG EC tablet Take 325 mg by mouth daily.     atorvastatin (LIPITOR) 80 MG tablet Take 80 mg by mouth daily.     calcium carbonate (TUMS - DOSED IN MG ELEMENTAL CALCIUM) 500 MG chewable tablet Chew 1,000 mg by mouth 3 (three) times daily as needed for indigestion or heartburn.     Carboxymethylcellulose Sodium (ARTIFICIAL TEARS OP) Place 1 drop into both eyes daily.     cetirizine (ZYRTEC) 10 MG tablet Take 10 mg by mouth daily.     dorzolamide-timolol (COSOPT) 22.3-6.8 MG/ML ophthalmic solution Place 1 drop into both eyes 2 (two) times daily.     fluticasone (FLONASE) 50 MCG/ACT nasal spray Place 2 sprays into both nostrils daily.     glipiZIDE (GLUCOTROL XL) 5 MG 24 hr tablet Take 5 mg by mouth daily.     HYDROcodone-acetaminophen (NORCO) 7.5-325 MG tablet Take 1 tablet by mouth 2 (two) times daily.     ibuprofen (ADVIL,MOTRIN) 200 MG tablet Take 200 mg by mouth every 6 (six) hours as needed for headache or moderate pain.     latanoprost (XALATAN) 0.005 % ophthalmic solution Place 1 drop into both eyes at bedtime.     lisinopril (PRINIVIL,ZESTRIL) 10 MG tablet Take 10 mg by mouth daily.     metFORMIN (GLUCOPHAGE) 500 MG tablet Take 1,000 mg by mouth 2 (two) times daily.     Multiple Vitamin (DAILY-VITE MULTIVITAMIN) TABS Take 1 tablet by mouth daily.     Multiple Vitamin (MULTIVITAMIN) capsule Take 1 capsule by mouth daily.      OXYGEN Inhale 2 L into the lungs at bedtime.     pantoprazole (PROTONIX) 40 MG tablet Take 1 tablet (40 mg total) by mouth daily. 30 tablet 1   phenylephrine (SUDAFED PE) 10 MG TABS tablet Take 10 mg by mouth at bedtime.     pregabalin (LYRICA) 100 MG capsule Take 100 mg by mouth 3 (three) times daily.     sertraline (ZOLOFT) 100 MG tablet Take 100 mg by mouth daily.     tolterodine (DETROL LA) 4 MG 24 hr capsule Take 4 mg by mouth daily.     No current facility-administered medications for this visit.    OBJECTIVE: Vitals:   06/04/21 1005  BP: (!) 129/55  Temp: 98.5 F (36.9 C)     Body mass index is 39.44 kg/m.    ECOG FS:0 - Asymptomatic  General: Well-developed,  well-nourished, no acute distress. Eyes: Pink conjunctiva, anicteric sclera. HEENT: Normocephalic, moist mucous membranes. Lungs: No audible wheezing or coughing. Heart: Regular rate and rhythm. Abdomen: Soft, nontender, no obvious distention. Musculoskeletal: No edema, cyanosis, or clubbing. Neuro: Alert, answering all questions appropriately. Cranial nerves grossly intact. Skin: No rashes or petechiae noted. Psych: Normal affect.   LAB RESULTS:  Lab Results  Component Value Date   NA 134 (L) 05/16/2021   K 4.4 05/16/2021   CL 101 05/16/2021   CO2 25 05/16/2021   GLUCOSE 203 (H) 05/16/2021   BUN 17 05/16/2021   CREATININE 0.53 05/16/2021   CALCIUM 8.9 05/16/2021   PROT 7.3 05/16/2021   ALBUMIN 4.1 05/16/2021   AST 18 05/16/2021   ALT 17 05/16/2021   ALKPHOS 62 05/16/2021   BILITOT 0.7 05/16/2021   GFRNONAA >60 05/16/2021   GFRAA >60 08/23/2017    Lab Results  Component Value Date   WBC 6.5 05/16/2021   NEUTROABS 4.2 05/16/2021   HGB 11.2 (L) 05/16/2021   HCT 34.8 (L) 05/16/2021   MCV 91.1 05/16/2021   PLT 184 05/16/2021     STUDIES: NM Sentinel Node Inj-No Rpt (Breast)  Result Date: 05/22/2021 Sulfur Colloid was injected by the Nuclear Medicine Technologist for sentinel lymph node  localization.   MM Breast Surgical Specimen  Result Date: 05/22/2021 CLINICAL DATA:  Status post radiofrequency tag localized LEFT breast lumpectomy. EXAM: SPECIMEN RADIOGRAPH OF THE LEFT BREAST COMPARISON:  Previous exam(s). FINDINGS: Status post excision of the LEFT breast. The radiofrequency tag and X shaped clip are present within the specimen. The X clip is at [facility]. IMPRESSION: Specimen radiograph of the LEFT breast. Electronically Signed   By: Valentino Saxon MD   On: 05/22/2021 16:17   MM DIAG BREAST TOMO UNI LEFT  Result Date: 05/13/2021 CLINICAL DATA:  Mammogram status post RF tag localization of the left breast. EXAM: DIAGNOSTIC LEFT MAMMOGRAM POST ULTRASOUND GUIDED RF TAG LOCALIZATION COMPARISON:  Previous exam(s). FINDINGS: Mammographic images were obtained following ultrasound guided RF tag localization of a mass in the upper-outer left breast. The RF tag is in expected position at the site of prior biopsy in the upper-outer left breast. IMPRESSION: Appropriate positioning of the RF tag at the site of the mass in the upper-outer left breast. Final Assessment: Post Procedure Mammograms for Marker Placement Electronically Signed   By: Ammie Ferrier M.D.   On: 05/13/2021 16:32   [place] LT RADIO FREQUENCY TAG LOC US GUIDE  Result Date: 05/13/2021 CLINICAL DATA:  70 year old female presenting for radiofrequency tag device localization of the left breast. EXAM: NEEDLE LOCALIZATION OF THE LEFT BREAST WITH ULTRASOUND GUIDANCE COMPARISON:  Previous exams. FINDINGS: Patient presents for needle localization prior to a mass in the left breast at 2 o'clock. I met with the patient and we discussed the procedure of needle localization including benefits and alternatives. We discussed the high likelihood of a successful procedure. We discussed the risks of the procedure, including infection, bleeding, tissue injury, and further surgery. Informed, written consent was given. The usual time-out protocol was  performed immediately prior to the procedure. Using ultrasound guidance, sterile technique, 1% lidocaine and a 7 cm radiofrequency tag needle, the mass in the left breast at 2 o'clock, 10 cm from the nipple was localized using a lateral approach. IMPRESSION: Radiofrequency tag localization of the left breast. No apparent complications. Electronically Signed   By: Ammie Ferrier M.D.   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  PLAN:    1. 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  It is unlikely she will require chemotherapy, but Oncotype DX score is pending at time of dictation.  She had consultation with radiation oncology today and plans to pursue adjuvant XRT.  Will follow-up with patient prior to initiating XRT if Oncotype risk score is high. Finally, given the ER/PR status of her malignancy she will benefit from letrozole for a total of 5 years.  Return to clinic at the end of radiation for further evaluation and initiation of letrozole.  I spent a total of 30 minutes reviewing chart data, face-to-face evaluation with the patient, counseling and coordination of care as detailed above.   Patient expressed understanding and was in agreement with this plan. She also understands that She can call clinic at any time with any questions, concerns, or complaints.   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

## 2021-06-02 IMAGING — MG MM DIGITAL DIAGNOSTIC UNILAT*L* W/ TOMO W/ CAD
4 series · 4 of 12 positions shown · non-contrast
Comparison: Previous exam(s).

CLINICAL DATA: Mammogram status post RF tag localization of the
left breast.

EXAM:
DIAGNOSTIC LEFT MAMMOGRAM POST ULTRASOUND GUIDED RF TAG LOCALIZATION

[L ML synth-2D]
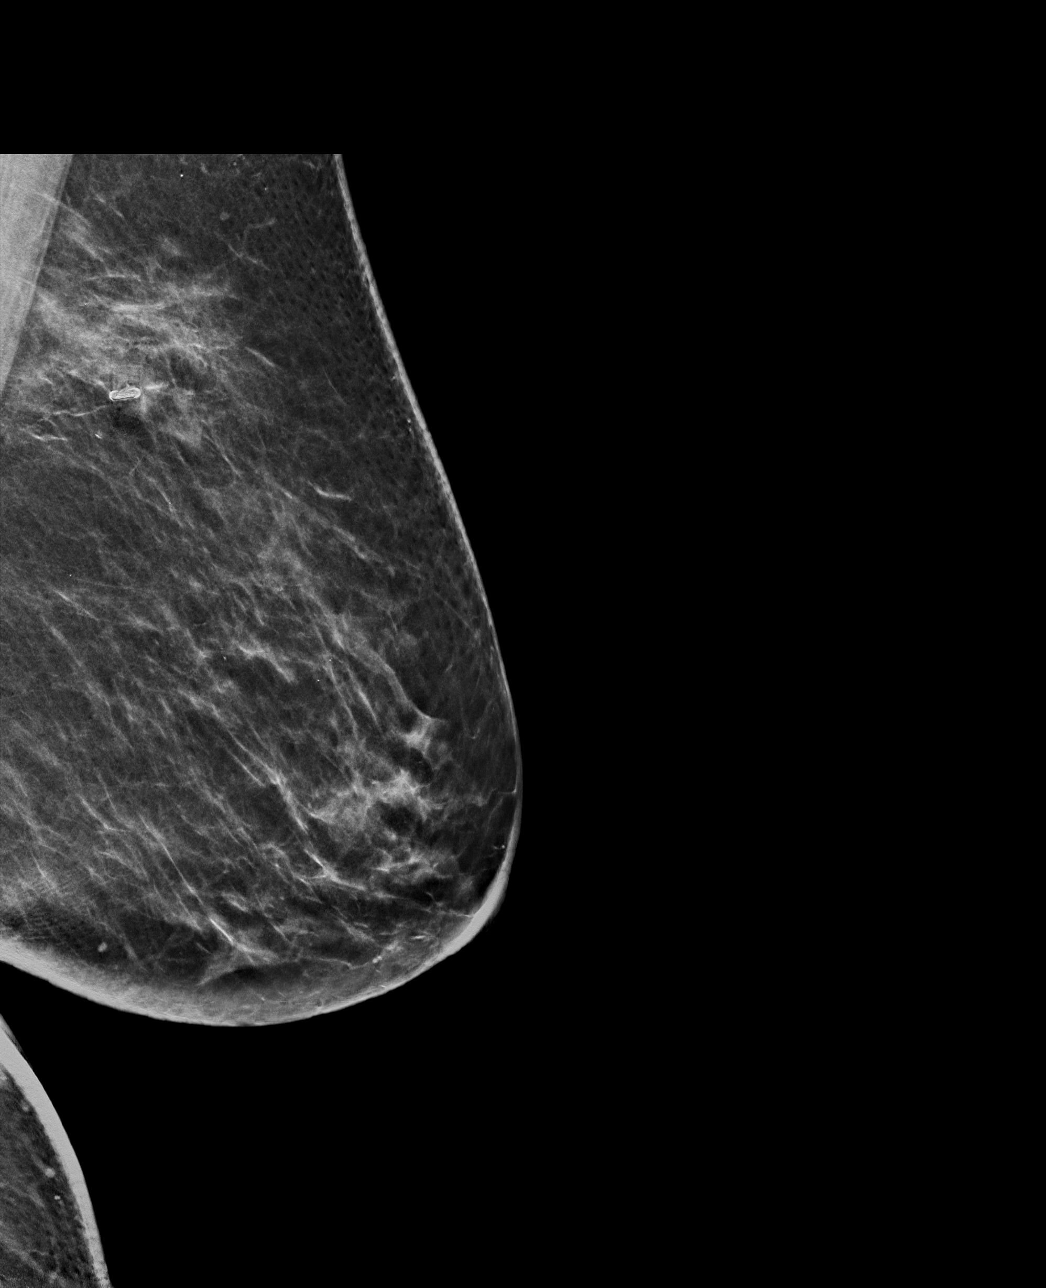

[L CC synth-2D]
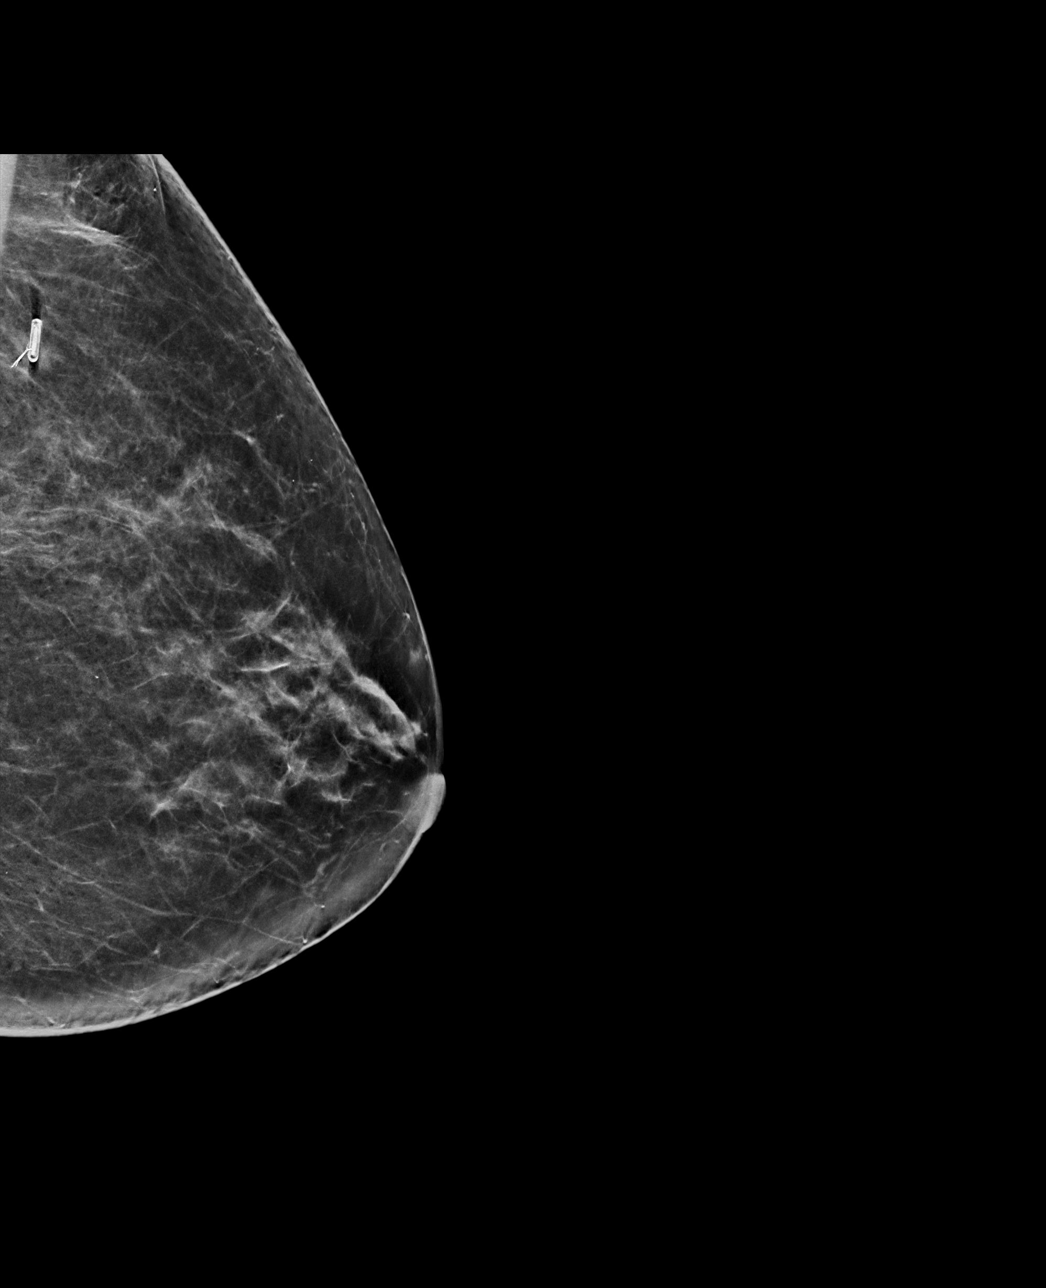

[L ML tomo · tomo slice 37/73.0]
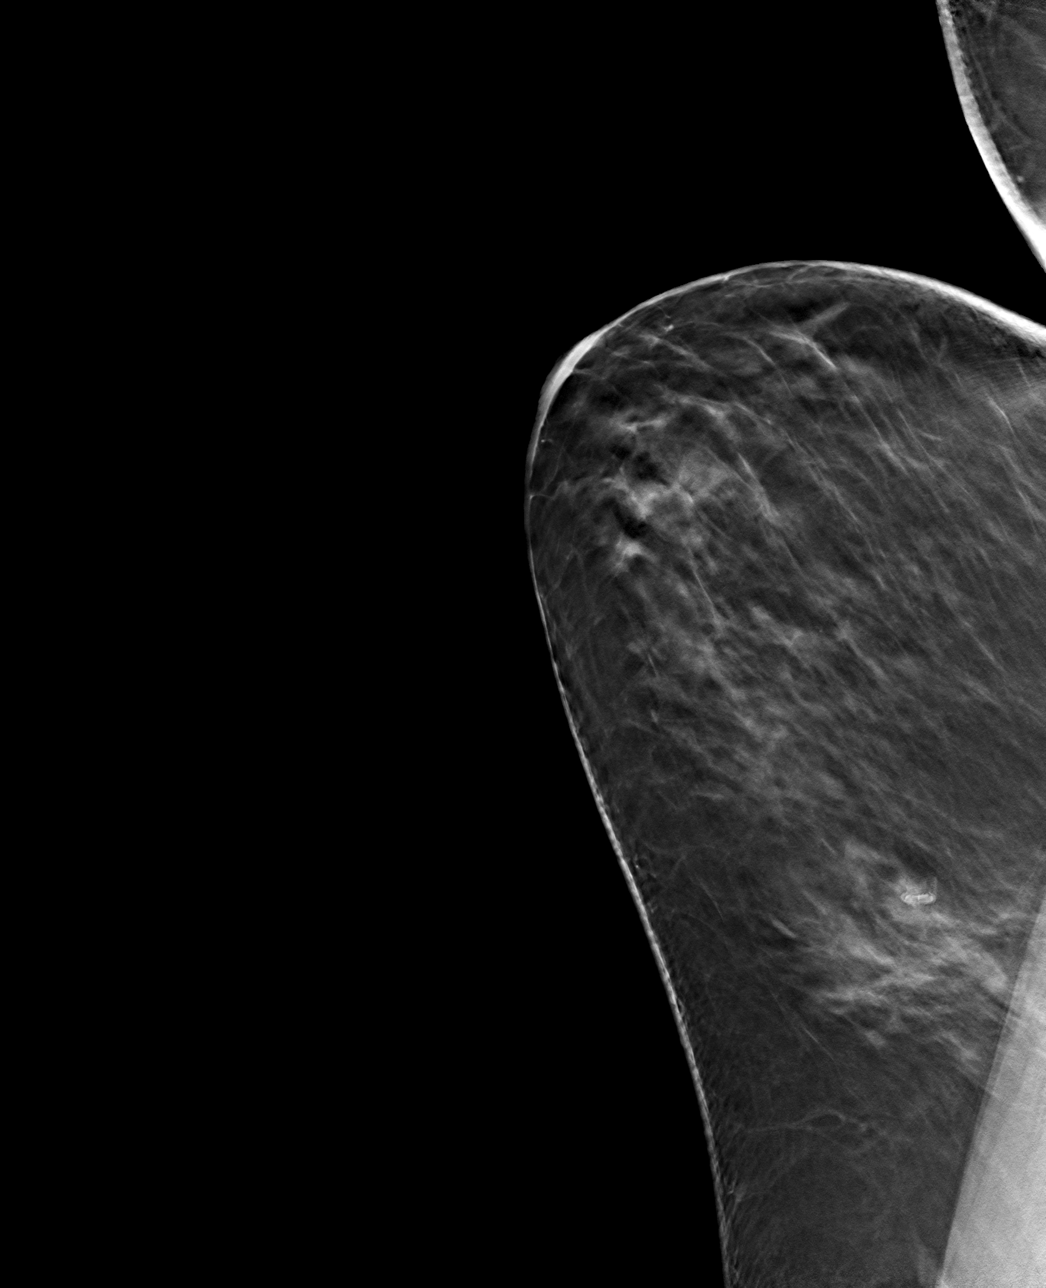

[L CC tomo · tomo slice 33/65.0]
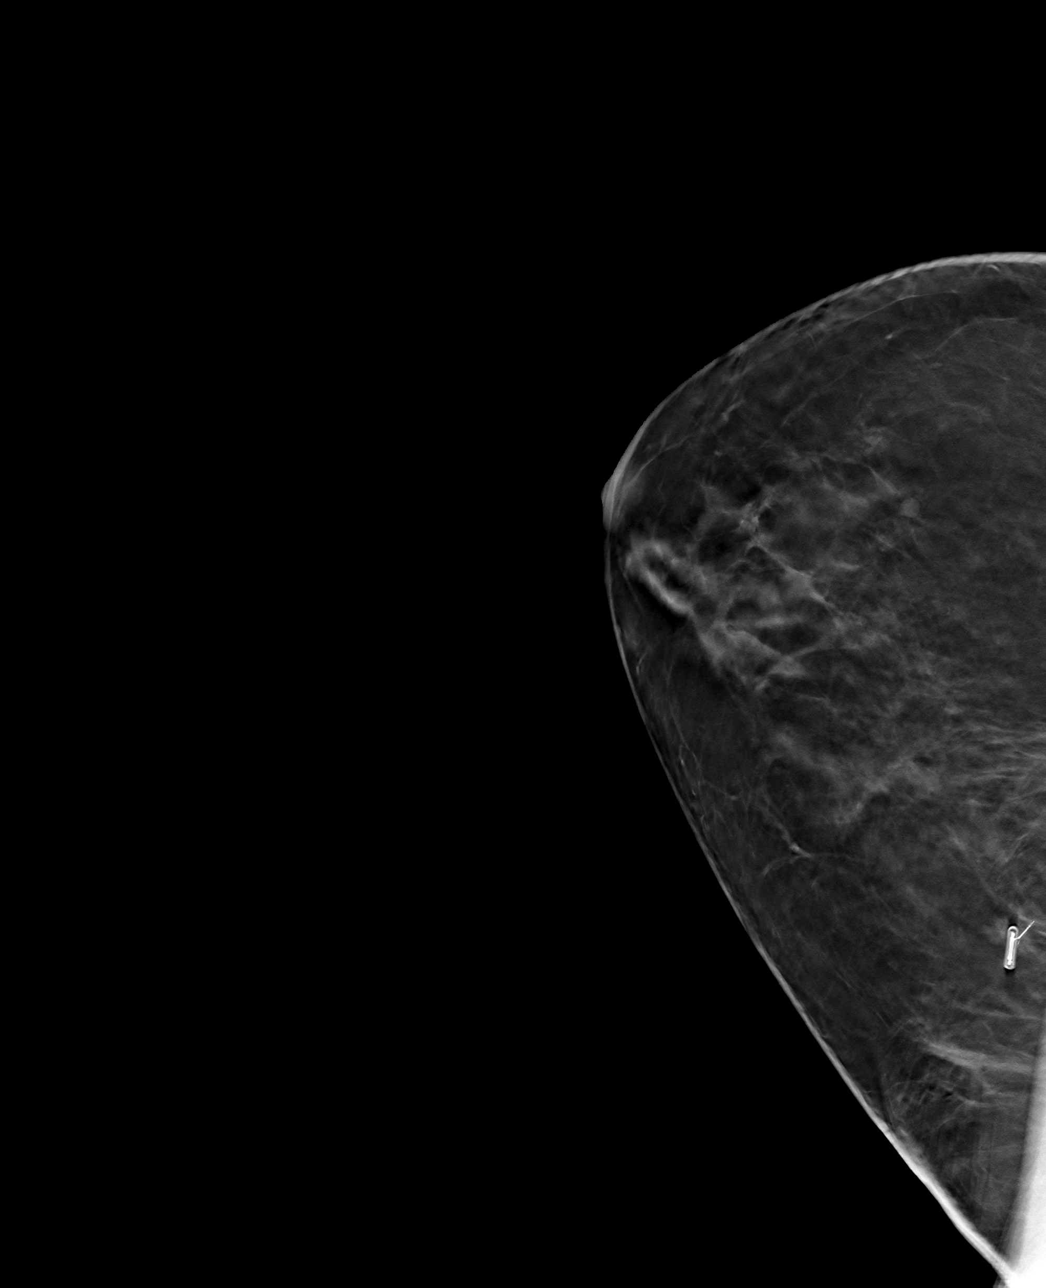

[4 of 12 positions shown; findings below may reference images not displayed]

FINDINGS: Mammographic images were obtained following ultrasound guided RF tag
localization of a mass in the upper-outer left breast. The RF tag is
in expected position at the site of prior biopsy in the upper-outer
left breast.
IMPRESSION: Appropriate positioning of the RF tag at the site of the mass in the
upper-outer left breast.

Final Assessment: Post Procedure Mammograms for Marker Placement

## 2021-06-04 ENCOUNTER — Encounter: Payer: Self-pay | Admitting: Oncology

## 2021-06-04 ENCOUNTER — Inpatient Hospital Stay: Payer: Medicare HMO | Attending: Oncology | Admitting: Oncology

## 2021-06-04 ENCOUNTER — Ambulatory Visit
Admission: RE | Admit: 2021-06-04 | Discharge: 2021-06-04 | Disposition: A | Discharge status: Home / Self Care | Payer: Medicare HMO | Source: Ambulatory Visit | Attending: Radiation Oncology | Admitting: Radiation Oncology

## 2021-06-04 VITALS — BP 129/55 | Temp 98.5°F | Wt 237.0 lb

## 2021-06-04 DIAGNOSIS — C50412 Malignant neoplasm of upper-outer quadrant of left female breast: Secondary | ICD-10-CM

## 2021-06-04 DIAGNOSIS — M5136 Other intervertebral disc degeneration, lumbar region: Secondary | ICD-10-CM | POA: Insufficient documentation

## 2021-06-04 DIAGNOSIS — E785 Hyperlipidemia, unspecified: Secondary | ICD-10-CM | POA: Insufficient documentation

## 2021-06-04 DIAGNOSIS — I350 Nonrheumatic aortic (valve) stenosis: Secondary | ICD-10-CM | POA: Diagnosis not present

## 2021-06-04 DIAGNOSIS — M129 Arthropathy, unspecified: Secondary | ICD-10-CM | POA: Insufficient documentation

## 2021-06-04 DIAGNOSIS — Z7984 Long term (current) use of oral hypoglycemic drugs: Secondary | ICD-10-CM | POA: Diagnosis not present

## 2021-06-04 DIAGNOSIS — I10 Essential (primary) hypertension: Secondary | ICD-10-CM | POA: Insufficient documentation

## 2021-06-04 DIAGNOSIS — E119 Type 2 diabetes mellitus without complications: Secondary | ICD-10-CM | POA: Diagnosis not present

## 2021-06-04 DIAGNOSIS — Z7982 Long term (current) use of aspirin: Secondary | ICD-10-CM | POA: Insufficient documentation

## 2021-06-04 DIAGNOSIS — E669 Obesity, unspecified: Secondary | ICD-10-CM | POA: Insufficient documentation

## 2021-06-04 DIAGNOSIS — D649 Anemia, unspecified: Secondary | ICD-10-CM | POA: Diagnosis not present

## 2021-06-04 DIAGNOSIS — Z170 Estrogen receptor positive status [ER+]: Secondary | ICD-10-CM | POA: Insufficient documentation

## 2021-06-04 DIAGNOSIS — Z800 Family history of malignant neoplasm of digestive organs: Secondary | ICD-10-CM | POA: Diagnosis not present

## 2021-06-04 DIAGNOSIS — I340 Nonrheumatic mitral (valve) insufficiency: Secondary | ICD-10-CM | POA: Insufficient documentation

## 2021-06-04 NOTE — Progress Notes (Signed)
Pt in for follow up, states still has some swelling and soreness in left breast.

## 2021-06-04 NOTE — Consult Note (Signed)
NEW PATIENT EVALUATION  Name: Rhonda Thornton  MRN: [id number]  Date:   06/04/2021     DOB: 07/16/1951   This 70 y.o. 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  REFERRING PHYSICIAN: Denton Lank, MD  CHIEF COMPLAINT: No chief complaint on file.   DIAGNOSIS: The encounter diagnosis was Carcinoma of upper-outer quadrant of female breast, left ([place]).   PREVIOUS INVESTIGATIONS:  Mammogram and ultrasound reviewed Clinical notes reviewed Pathology report reviewed  HPI: Patient is a 70 year old female who presented with abnormal screening mammogram of her left breast.  There was a 0.6 cm suspicious mass in the left breast at 2 o'clock position.  This was confirmed on ultrasound to be at the 2 o'clock position 10 cm from the nipple showing irregular hypoechoic mass measuring 0.6 cm in greatest dimension.  Ultrasound the left axilla was normal.  She underwent ultrasound-guided biopsy which was positive for ER/PR positive HER2 negative invasive mammary carcinoma.  She went on to have a wide local excision for a 7 mm grade 1 invasive mammary carcinoma.  Margins were negative at 1.3 mm.  She also had DCIS with margins also negative but close at 1.1 mm.  6 sentinel lymph nodes were negative for metastatic disease she has been seen by medical oncology will be treated with antiestrogen therapy is not a candidate for systemic treatment based on the size and tumor characteristics.  She is seen today for radiation oncology opinion.  She is doing well she specifically denies significant breast tenderness cough or bone pain she still has some swelling in her left breast.  PLANNED TREATMENT REGIMEN: Hypofractionated left whole breast radiation  PAST MEDICAL HISTORY:  has a past medical history of Anemia, Anginal pain ([place]), Arthritis, Chronic pain  syndrome, Degenerative disc disease, lumbar, Edema of left lower extremity, Gastroparesis due to secondary diabetes ([place]), GERD (gastroesophageal reflux disease), Heart murmur, History of seizure disorder, Hyperlipidemia, Hypertension, Invasive carcinoma of breast ([place]) (04/25/2021), Mild aortic stenosis, Mild mitral regurgitation, Mild tricuspid regurgitation, Morbid obesity ([place]), Opioid use, OSA (obstructive sleep apnea), Recurrent falls while walking, Requires supplemental oxygen, T2DM (type 2 diabetes mellitus) ([place]), and Tubular adenoma of colon.    PAST SURGICAL HISTORY:  Past Surgical History:  Procedure Laterality Date   ABDOMINAL HYSTERECTOMY  1991   partial   BREAST BIOPSY Left 0505/2022   u/s bx-"X" clip-INVASIVE MAMMARY CARCINOMA WITH TUBULAR FEATURES   BREAST CYST ASPIRATION Right 07/16/2016   neg   BREAST LUMPECTOMY,RADIO FREQ LOCALIZER,AXILLARY SENTINEL LYMPH NODE BIOPSY Left 05/22/2021   Procedure: BREAST LUMPECTOMY,RADIO FREQ LOCALIZER,AXILLARY SENTINEL LYMPH NODE BIOPSY;  Surgeon: Ronny Bacon, MD;  Location: [facility] ORS;  Service: General;  Laterality: Left;   CHOLECYSTECTOMY     COLONOSCOPY WITH PROPOFOL N/A 07/30/2016   Procedure: COLONOSCOPY WITH PROPOFOL;  Surgeon: Lollie Sails, MD;  Location: [facility] ENDOSCOPY;  Service: Endoscopy;  Laterality: N/A;   ESOPHAGOGASTRODUODENOSCOPY (EGD) WITH PROPOFOL N/A 09/09/2017   Procedure: ESOPHAGOGASTRODUODENOSCOPY (EGD) WITH PROPOFOL;  Surgeon: Toledo, Benay Pike, MD;  Location: [facility] ENDOSCOPY;  Service: Endoscopy;  Laterality: N/A;   EYE SURGERY Right    for pterygium   FUNCTIONAL ENDOSCOPIC SINUS SURGERY     TONSILLECTOMY      FAMILY HISTORY: family history includes Colon cancer in her brother; Stomach cancer in her mother.  SOCIAL HISTORY:  reports that she has never smoked.  She has never used smokeless tobacco. She reports that she does not drink alcohol and does not use drugs.  ALLERGIES: Patient has no known  allergies.  MEDICATIONS:  Current Outpatient Medications  Medication Sig Dispense Refill   alum & mag hydroxide-simeth (MAALOX/MYLANTA) 200-200-20 MG/5ML suspension Take 30 mLs by mouth every 6 (six) hours as needed for indigestion or heartburn.     amLODipine (NORVASC) 5 MG tablet Take 5 mg by mouth daily.     aspirin 325 MG EC tablet Take 325 mg by mouth daily.     atorvastatin (LIPITOR) 80 MG tablet Take 80 mg by mouth daily.     calcium carbonate (TUMS - DOSED IN MG ELEMENTAL CALCIUM) 500 MG chewable tablet Chew 1,000 mg by mouth 3 (three) times daily as needed for indigestion or heartburn.     Carboxymethylcellulose Sodium (ARTIFICIAL TEARS OP) Place 1 drop into both eyes daily.     cetirizine (ZYRTEC) 10 MG tablet Take 10 mg by mouth daily.     dorzolamide-timolol (COSOPT) 22.3-6.8 MG/ML ophthalmic solution Place 1 drop into both eyes 2 (two) times daily.     fluticasone (FLONASE) 50 MCG/ACT nasal spray Place 2 sprays into both nostrils daily.     glipiZIDE (GLUCOTROL XL) 5 MG 24 hr tablet Take 5 mg by mouth daily.     HYDROcodone-acetaminophen (NORCO) 7.5-325 MG tablet Take 1 tablet by mouth 2 (two) times daily.     ibuprofen (ADVIL,MOTRIN) 200 MG tablet Take 200 mg by mouth every 6 (six) hours as needed for headache or moderate pain.     latanoprost (XALATAN) 0.005 % ophthalmic solution Place 1 drop into both eyes at bedtime.     lisinopril (PRINIVIL,ZESTRIL) 10 MG tablet Take 10 mg by mouth daily.     metFORMIN (GLUCOPHAGE) 500 MG tablet Take 1,000 mg by mouth 2 (two) times daily.     Multiple Vitamin (DAILY-VITE MULTIVITAMIN) TABS Take 1 tablet by mouth daily.     Multiple Vitamin (MULTIVITAMIN) capsule Take 1 capsule by mouth daily.     OXYGEN Inhale 2 L into the lungs at bedtime.     pantoprazole (PROTONIX) 40 MG tablet Take 1 tablet (40 mg total) by mouth daily. 30 tablet 1   phenylephrine (SUDAFED PE) 10 MG TABS tablet Take 10 mg by mouth at bedtime.     pregabalin (LYRICA) 100  MG capsule Take 100 mg by mouth 3 (three) times daily.     sertraline (ZOLOFT) 100 MG tablet Take 100 mg by mouth daily.     tolterodine (DETROL LA) 4 MG 24 hr capsule Take 4 mg by mouth daily.     No current facility-administered medications for this encounter.    ECOG PERFORMANCE STATUS:  0 - Asymptomatic  REVIEW OF SYSTEMS: Patient denies any weight loss, fatigue, weakness, fever, chills or night sweats. Patient denies any loss of vision, blurred vision. Patient denies any ringing  of the ears or hearing loss. No irregular heartbeat. Patient denies heart murmur or history of fainting. Patient denies any chest pain or pain radiating to her upper extremities. Patient denies any shortness of breath, difficulty breathing at night, cough or hemoptysis. Patient denies any swelling in the lower legs. Patient denies any nausea vomiting, vomiting of blood, or coffee ground material in the vomitus. Patient denies any stomach pain. Patient states has had normal bowel movements no significant constipation or diarrhea. Patient denies any dysuria, hematuria or significant nocturia. Patient denies any problems walking, swelling in the joints  or loss of balance. Patient denies any skin changes, loss of hair or loss of weight. Patient denies any excessive worrying or anxiety or significant depression. Patient denies any problems with insomnia. Patient denies excessive thirst, polyuria, polydipsia. Patient denies any swollen glands, patient denies easy bruising or easy bleeding. Patient denies any recent infections, allergies or URI. Patient "s visual fields have not changed significantly in recent time.   PHYSICAL EXAM: There were no vitals taken for this visit. She is status post single incision wide local excision of the left breast with incision healing well.  Breast still has some ecchymosis and somewhat swollen.  No dominant masses noted in either breast.  No axillary or supraclavicular adenopathy is  appreciated.  Well-developed well-nourished patient in NAD. HEENT reveals PERLA, EOMI, discs not visualized.  Oral cavity is clear. No oral mucosal lesions are identified. Neck is clear without evidence of cervical or supraclavicular adenopathy. Lungs are clear to A&P. Cardiac examination is essentially unremarkable with regular rate and rhythm without murmur rub or thrill. Abdomen is benign with no organomegaly or masses noted. Motor sensory and DTR levels are equal and symmetric in the upper and lower extremities. Cranial nerves II through XII are grossly intact. Proprioception is intact. No peripheral adenopathy or edema is identified. No motor or sensory levels are noted. Crude visual fields are within normal range.  LABORATORY DATA: Pathology report reviewed    RADIOLOGY RESULTS: Mammogram and ultrasound reviewed compatible with above-stated findings   IMPRESSION: Stage Ia ER/PR positive invasive mammary carcinoma of the left breast status post wide local excision and sentinel node biopsy in 70 year old female  PLAN: At this time I have recommended a hypofractionated course of whole breast radiation over 3 weeks.  Based on her close DCIS as well as invasive mammary carcinoma margin would offer another 1600 centigrade of electron-beam to her scar.  Risks and benefits of treatment including skin reaction fatigue alteration blood counts possible inclusion of superficial lung all were reviewed in detail with the patient.  She also will be a candidate for antiestrogen therapy after completion of radiation.  I would like to allow another week for healing of set up CT simulation for next week.  Patient comprehends my recommendations well.  I would like to take this opportunity to thank you for allowing me to participate in the care of your patient.[name], MD

## 2021-06-07 ENCOUNTER — Encounter: Payer: Self-pay | Admitting: Oncology

## 2021-06-10 ENCOUNTER — Ambulatory Visit: Payer: Self-pay | Admitting: Urology

## 2021-06-11 ENCOUNTER — Ambulatory Visit
Admission: RE | Admit: 2021-06-11 | Discharge: 2021-06-11 | Disposition: A | Discharge status: Home / Self Care | Payer: Medicare HMO | Source: Ambulatory Visit | Attending: Radiation Oncology | Admitting: Radiation Oncology

## 2021-06-11 DIAGNOSIS — C50412 Malignant neoplasm of upper-outer quadrant of left female breast: Secondary | ICD-10-CM | POA: Diagnosis present

## 2021-06-11 DIAGNOSIS — Z510 Encounter for antineoplastic radiation therapy: Secondary | ICD-10-CM | POA: Insufficient documentation

## 2021-06-11 DIAGNOSIS — Z170 Estrogen receptor positive status [ER+]: Secondary | ICD-10-CM | POA: Diagnosis not present

## 2021-06-14 ENCOUNTER — Other Ambulatory Visit: Payer: Self-pay | Admitting: *Deleted

## 2021-06-14 DIAGNOSIS — C50412 Malignant neoplasm of upper-outer quadrant of left female breast: Secondary | ICD-10-CM

## 2021-06-15 DIAGNOSIS — Z510 Encounter for antineoplastic radiation therapy: Secondary | ICD-10-CM | POA: Diagnosis not present

## 2021-06-18 ENCOUNTER — Ambulatory Visit: Admission: RE | Admit: 2021-06-18 | Payer: Medicare HMO | Source: Ambulatory Visit

## 2021-06-18 DIAGNOSIS — Z510 Encounter for antineoplastic radiation therapy: Secondary | ICD-10-CM | POA: Diagnosis not present

## 2021-06-19 ENCOUNTER — Ambulatory Visit
Admission: RE | Admit: 2021-06-19 | Discharge: 2021-06-19 | Disposition: A | Discharge status: Home / Self Care | Payer: Medicare HMO | Source: Ambulatory Visit | Attending: Radiation Oncology | Admitting: Radiation Oncology

## 2021-06-19 DIAGNOSIS — Z510 Encounter for antineoplastic radiation therapy: Secondary | ICD-10-CM | POA: Diagnosis not present

## 2021-06-20 ENCOUNTER — Ambulatory Visit
Admission: RE | Admit: 2021-06-20 | Discharge: 2021-06-20 | Disposition: A | Discharge status: Home / Self Care | Payer: Medicare HMO | Source: Ambulatory Visit | Attending: Radiation Oncology | Admitting: Radiation Oncology

## 2021-06-20 DIAGNOSIS — Z510 Encounter for antineoplastic radiation therapy: Secondary | ICD-10-CM | POA: Diagnosis not present

## 2021-06-21 ENCOUNTER — Ambulatory Visit
Admission: RE | Admit: 2021-06-21 | Discharge: 2021-06-21 | Disposition: A | Discharge status: Home / Self Care | Payer: Medicare HMO | Source: Ambulatory Visit | Attending: Radiation Oncology | Admitting: Radiation Oncology

## 2021-06-21 DIAGNOSIS — Z170 Estrogen receptor positive status [ER+]: Secondary | ICD-10-CM | POA: Diagnosis not present

## 2021-06-21 DIAGNOSIS — C50412 Malignant neoplasm of upper-outer quadrant of left female breast: Secondary | ICD-10-CM | POA: Diagnosis present

## 2021-06-21 DIAGNOSIS — Z510 Encounter for antineoplastic radiation therapy: Secondary | ICD-10-CM | POA: Diagnosis not present

## 2021-06-25 ENCOUNTER — Ambulatory Visit
Admission: RE | Admit: 2021-06-25 | Discharge: 2021-06-25 | Disposition: A | Discharge status: Home / Self Care | Payer: Medicare HMO | Source: Ambulatory Visit | Attending: Radiation Oncology | Admitting: Radiation Oncology

## 2021-06-25 DIAGNOSIS — Z510 Encounter for antineoplastic radiation therapy: Secondary | ICD-10-CM | POA: Diagnosis not present

## 2021-06-26 ENCOUNTER — Ambulatory Visit
Admission: RE | Admit: 2021-06-26 | Discharge: 2021-06-26 | Disposition: A | Discharge status: Home / Self Care | Payer: Medicare HMO | Source: Ambulatory Visit | Attending: Radiation Oncology | Admitting: Radiation Oncology

## 2021-06-26 DIAGNOSIS — Z510 Encounter for antineoplastic radiation therapy: Secondary | ICD-10-CM | POA: Diagnosis not present

## 2021-06-27 ENCOUNTER — Ambulatory Visit
Admission: RE | Admit: 2021-06-27 | Discharge: 2021-06-27 | Disposition: A | Discharge status: Home / Self Care | Payer: Medicare HMO | Source: Ambulatory Visit | Attending: Radiation Oncology | Admitting: Radiation Oncology

## 2021-06-27 DIAGNOSIS — Z510 Encounter for antineoplastic radiation therapy: Secondary | ICD-10-CM | POA: Diagnosis not present

## 2021-06-28 ENCOUNTER — Ambulatory Visit
Admission: RE | Admit: 2021-06-28 | Discharge: 2021-06-28 | Disposition: A | Discharge status: Home / Self Care | Payer: Medicare HMO | Source: Ambulatory Visit | Attending: Radiation Oncology | Admitting: Radiation Oncology

## 2021-06-28 DIAGNOSIS — Z510 Encounter for antineoplastic radiation therapy: Secondary | ICD-10-CM | POA: Diagnosis not present

## 2021-07-01 ENCOUNTER — Ambulatory Visit
Admission: RE | Admit: 2021-07-01 | Discharge: 2021-07-01 | Disposition: A | Discharge status: Home / Self Care | Payer: Medicare HMO | Source: Ambulatory Visit | Attending: Radiation Oncology | Admitting: Radiation Oncology

## 2021-07-01 DIAGNOSIS — Z510 Encounter for antineoplastic radiation therapy: Secondary | ICD-10-CM | POA: Diagnosis not present

## 2021-07-02 ENCOUNTER — Ambulatory Visit
Admission: RE | Admit: 2021-07-02 | Discharge: 2021-07-02 | Disposition: A | Discharge status: Home / Self Care | Payer: Medicare HMO | Source: Ambulatory Visit | Attending: Radiation Oncology | Admitting: Radiation Oncology

## 2021-07-02 DIAGNOSIS — Z510 Encounter for antineoplastic radiation therapy: Secondary | ICD-10-CM | POA: Diagnosis not present

## 2021-07-03 ENCOUNTER — Ambulatory Visit
Admission: RE | Admit: 2021-07-03 | Discharge: 2021-07-03 | Disposition: A | Discharge status: Home / Self Care | Payer: Medicare HMO | Source: Ambulatory Visit | Attending: Radiation Oncology | Admitting: Radiation Oncology

## 2021-07-03 DIAGNOSIS — M25562 Pain in left knee: Secondary | ICD-10-CM | POA: Insufficient documentation

## 2021-07-03 DIAGNOSIS — Z510 Encounter for antineoplastic radiation therapy: Secondary | ICD-10-CM | POA: Diagnosis not present

## 2021-07-03 DIAGNOSIS — M1711 Unilateral primary osteoarthritis, right knee: Secondary | ICD-10-CM | POA: Insufficient documentation

## 2021-07-04 ENCOUNTER — Inpatient Hospital Stay: Payer: Medicare HMO | Attending: Oncology

## 2021-07-04 ENCOUNTER — Ambulatory Visit
Admission: RE | Admit: 2021-07-04 | Discharge: 2021-07-04 | Disposition: A | Discharge status: Home / Self Care | Payer: Medicare HMO | Source: Ambulatory Visit | Attending: Radiation Oncology | Admitting: Radiation Oncology

## 2021-07-04 DIAGNOSIS — C50412 Malignant neoplasm of upper-outer quadrant of left female breast: Secondary | ICD-10-CM | POA: Insufficient documentation

## 2021-07-04 DIAGNOSIS — Z170 Estrogen receptor positive status [ER+]: Secondary | ICD-10-CM | POA: Diagnosis not present

## 2021-07-04 DIAGNOSIS — Z510 Encounter for antineoplastic radiation therapy: Secondary | ICD-10-CM | POA: Diagnosis not present

## 2021-07-04 LAB — CBC
HCT: 32.1 % — ABNORMAL LOW (ref 36.0–46.0)
Hemoglobin: 10.3 g/dL — ABNORMAL LOW (ref 12.0–15.0)
MCH: 29.3 pg (ref 26.0–34.0)
MCHC: 32.1 g/dL (ref 30.0–36.0)
MCV: 91.5 fL (ref 80.0–100.0)
Platelets: 188 10*3/uL (ref 150–400)
RBC: 3.51 MIL/uL — ABNORMAL LOW (ref 3.87–5.11)
RDW: 14.7 % (ref 11.5–15.5)
WBC: 8 10*3/uL (ref 4.0–10.5)
nRBC: 0 % (ref 0.0–0.2)

## 2021-07-05 ENCOUNTER — Ambulatory Visit
Admission: RE | Admit: 2021-07-05 | Discharge: 2021-07-05 | Disposition: A | Discharge status: Home / Self Care | Payer: Medicare HMO | Source: Ambulatory Visit | Attending: Radiation Oncology | Admitting: Radiation Oncology

## 2021-07-05 DIAGNOSIS — Z510 Encounter for antineoplastic radiation therapy: Secondary | ICD-10-CM | POA: Diagnosis not present

## 2021-07-07 DIAGNOSIS — Z510 Encounter for antineoplastic radiation therapy: Secondary | ICD-10-CM | POA: Diagnosis not present

## 2021-07-08 ENCOUNTER — Ambulatory Visit
Admission: RE | Admit: 2021-07-08 | Discharge: 2021-07-08 | Disposition: A | Discharge status: Home / Self Care | Payer: Medicare HMO | Source: Ambulatory Visit | Attending: Radiation Oncology | Admitting: Radiation Oncology

## 2021-07-08 DIAGNOSIS — Z510 Encounter for antineoplastic radiation therapy: Secondary | ICD-10-CM | POA: Diagnosis not present

## 2021-07-09 ENCOUNTER — Ambulatory Visit
Admission: RE | Admit: 2021-07-09 | Discharge: 2021-07-09 | Disposition: A | Discharge status: Home / Self Care | Payer: Medicare HMO | Source: Ambulatory Visit | Attending: Radiation Oncology | Admitting: Radiation Oncology

## 2021-07-09 DIAGNOSIS — Z510 Encounter for antineoplastic radiation therapy: Secondary | ICD-10-CM | POA: Diagnosis not present

## 2021-07-10 ENCOUNTER — Ambulatory Visit
Admission: RE | Admit: 2021-07-10 | Discharge: 2021-07-10 | Disposition: A | Discharge status: Home / Self Care | Payer: Medicare HMO | Source: Ambulatory Visit | Attending: Radiation Oncology | Admitting: Radiation Oncology

## 2021-07-10 DIAGNOSIS — Z510 Encounter for antineoplastic radiation therapy: Secondary | ICD-10-CM | POA: Diagnosis not present

## 2021-07-11 ENCOUNTER — Ambulatory Visit: Admission: RE | Admit: 2021-07-11 | Payer: Medicare HMO | Source: Ambulatory Visit

## 2021-07-11 ENCOUNTER — Ambulatory Visit
Admission: RE | Admit: 2021-07-11 | Discharge: 2021-07-11 | Disposition: A | Discharge status: Home / Self Care | Payer: Medicare HMO | Source: Ambulatory Visit | Attending: Radiation Oncology | Admitting: Radiation Oncology

## 2021-07-11 DIAGNOSIS — Z510 Encounter for antineoplastic radiation therapy: Secondary | ICD-10-CM | POA: Diagnosis not present

## 2021-07-12 ENCOUNTER — Ambulatory Visit
Admission: RE | Admit: 2021-07-12 | Discharge: 2021-07-12 | Disposition: A | Discharge status: Home / Self Care | Payer: Medicare HMO | Source: Ambulatory Visit | Attending: Radiation Oncology | Admitting: Radiation Oncology

## 2021-07-12 DIAGNOSIS — Z510 Encounter for antineoplastic radiation therapy: Secondary | ICD-10-CM | POA: Diagnosis not present

## 2021-07-15 ENCOUNTER — Ambulatory Visit
Admission: RE | Admit: 2021-07-15 | Discharge: 2021-07-15 | Disposition: A | Discharge status: Home / Self Care | Payer: Medicare HMO | Source: Ambulatory Visit | Attending: Radiation Oncology | Admitting: Radiation Oncology

## 2021-07-15 DIAGNOSIS — Z510 Encounter for antineoplastic radiation therapy: Secondary | ICD-10-CM | POA: Diagnosis not present

## 2021-07-16 ENCOUNTER — Other Ambulatory Visit: Payer: Self-pay | Admitting: Licensed Clinical Social Worker

## 2021-07-16 ENCOUNTER — Ambulatory Visit
Admission: RE | Admit: 2021-07-16 | Discharge: 2021-07-16 | Disposition: A | Discharge status: Home / Self Care | Payer: Medicare HMO | Source: Ambulatory Visit | Attending: Radiation Oncology | Admitting: Radiation Oncology

## 2021-07-16 DIAGNOSIS — C50412 Malignant neoplasm of upper-outer quadrant of left female breast: Secondary | ICD-10-CM

## 2021-07-16 DIAGNOSIS — Z510 Encounter for antineoplastic radiation therapy: Secondary | ICD-10-CM | POA: Diagnosis not present

## 2021-07-16 MED ORDER — SILVER SULFADIAZINE 1 % EX CREA: 1.0000 "application " | TOPICAL_CREAM | Freq: Every day | CUTANEOUS | 0 refills | Status: DC

## 2021-07-17 ENCOUNTER — Ambulatory Visit
Admission: RE | Admit: 2021-07-17 | Discharge: 2021-07-17 | Disposition: A | Discharge status: Home / Self Care | Payer: Medicare HMO | Source: Ambulatory Visit | Attending: Radiation Oncology | Admitting: Radiation Oncology

## 2021-07-17 DIAGNOSIS — Z510 Encounter for antineoplastic radiation therapy: Secondary | ICD-10-CM | POA: Diagnosis not present

## 2021-07-18 ENCOUNTER — Inpatient Hospital Stay: Payer: Medicare HMO

## 2021-07-18 ENCOUNTER — Ambulatory Visit
Admission: RE | Admit: 2021-07-18 | Discharge: 2021-07-18 | Disposition: A | Discharge status: Home / Self Care | Payer: Medicare HMO | Source: Ambulatory Visit | Attending: Radiation Oncology | Admitting: Radiation Oncology

## 2021-07-18 DIAGNOSIS — Z510 Encounter for antineoplastic radiation therapy: Secondary | ICD-10-CM | POA: Diagnosis not present

## 2021-07-18 DIAGNOSIS — C50412 Malignant neoplasm of upper-outer quadrant of left female breast: Secondary | ICD-10-CM | POA: Diagnosis not present

## 2021-07-18 LAB — CBC
HCT: 35.9 % — ABNORMAL LOW (ref 36.0–46.0)
Hemoglobin: 11.1 g/dL — ABNORMAL LOW (ref 12.0–15.0)
MCH: 28.4 pg (ref 26.0–34.0)
MCHC: 30.9 g/dL (ref 30.0–36.0)
MCV: 91.8 fL (ref 80.0–100.0)
Platelets: 180 10*3/uL (ref 150–400)
RBC: 3.91 MIL/uL (ref 3.87–5.11)
RDW: 14.6 % (ref 11.5–15.5)
WBC: 6.9 10*3/uL (ref 4.0–10.5)
nRBC: 0 % (ref 0.0–0.2)

## 2021-07-19 ENCOUNTER — Ambulatory Visit
Admission: RE | Admit: 2021-07-19 | Discharge: 2021-07-19 | Disposition: A | Discharge status: Home / Self Care | Payer: Medicare HMO | Source: Ambulatory Visit | Attending: Radiation Oncology | Admitting: Radiation Oncology

## 2021-07-19 DIAGNOSIS — Z510 Encounter for antineoplastic radiation therapy: Secondary | ICD-10-CM | POA: Diagnosis not present

## 2021-07-21 NOTE — Progress Notes (Signed)
[place]  [phone] [phone]  ID: CAMILA MAITA OB: May 15, 1951  MR#: [id number]  WLS#:[id number]  Patient Care Team: Denton Lank, MD as PCP - General (Family Medicine) Theodore Demark, RN as Oncology Nurse Navigator  CHIEF COMPLAINT: Clinical stage IA ER/PR positive, HER-2 negative invasive carcinoma of the upper-outer quadrant of the left breast.  Oncotype DX score is 0.  INTERVAL HISTORY: Patient returns to clinic today at the conclusion of her adjuvant XRT for further evaluation and to initiate letrozole.  She tolerated her treatment well with only some mild increase in fatigue and erythema of her skin.  She otherwise feels well.  She has no neurologic complaints.  She denies any recent fevers or illnesses.  She has a good appetite and denies weight loss.  She has no chest pain, shortness of breath, cough, or hemoptysis.  She denies any nausea, vomiting, constipation, or diarrhea.  She has no urinary complaints.  Patient offers no further specific complaints today.  REVIEW OF SYSTEMS:   Review of Systems  Constitutional:  Positive for malaise/fatigue. Negative for fever and weight loss.  Respiratory: Negative.  Negative for hemoptysis and shortness of breath.   Cardiovascular: Negative.  Negative for chest pain and leg swelling.  Gastrointestinal: Negative.  Negative for abdominal pain.  Genitourinary: Negative.  Negative for dysuria.  Musculoskeletal: Negative.  Negative for back pain.  Skin: Negative.  Negative for rash.  Neurological: Negative.  Negative for dizziness, focal weakness, weakness and headaches.  Psychiatric/Behavioral: Negative.  The patient is not nervous/anxious.    As per HPI. Otherwise, a complete review of systems is negative.  PAST MEDICAL HISTORY: Past Medical History:  Diagnosis Date   Anemia    Anginal pain (HCC)    Arthritis    Chronic pain syndrome    Degenerative disc disease, lumbar    Edema of left  lower extremity    Gastroparesis due to secondary diabetes (HCC)    GERD (gastroesophageal reflux disease)    Heart murmur    History of seizure disorder    Hyperlipidemia    Hypertension    Invasive carcinoma of breast ([place]) 04/25/2021   LEFT; stage 1A; grade I invasive mammary carcinoma; ER/PR (+); HER2/neu (-)   Mild aortic stenosis    Mild mitral regurgitation    Mild tricuspid regurgitation    Morbid obesity (HCC)    Opioid use    therapeutic use for Dx of chronic pain syndrome   OSA (obstructive sleep apnea)    Recurrent falls while walking    uses rolling walker    Requires supplemental oxygen    2L/[place] at bedtime   T2DM (type 2 diabetes mellitus) ([place])    Tubular adenoma of colon     PAST SURGICAL HISTORY: Past Surgical History:  Procedure Laterality Date   ABDOMINAL HYSTERECTOMY  1991   partial   BREAST BIOPSY Left 0505/2022   u/s bx-"X" clip-INVASIVE MAMMARY CARCINOMA WITH TUBULAR FEATURES   BREAST CYST ASPIRATION Right 07/16/2016   neg   BREAST LUMPECTOMY,RADIO FREQ LOCALIZER,AXILLARY SENTINEL LYMPH NODE BIOPSY Left 05/22/2021   Procedure: BREAST LUMPECTOMY,RADIO FREQ LOCALIZER,AXILLARY SENTINEL LYMPH NODE BIOPSY;  Surgeon: Ronny Bacon, MD;  Location: [facility] ORS;  Service: General;  Laterality: Left;   CHOLECYSTECTOMY     COLONOSCOPY WITH PROPOFOL N/A 07/30/2016   Procedure: COLONOSCOPY WITH PROPOFOL;  Surgeon: Lollie Sails, MD;  Location: [facility] ENDOSCOPY;  Service: Endoscopy;  Laterality: N/A;   ESOPHAGOGASTRODUODENOSCOPY (EGD) WITH PROPOFOL N/A 09/09/2017  Procedure: ESOPHAGOGASTRODUODENOSCOPY (EGD) WITH PROPOFOL;  Surgeon: Toledo, Benay Pike, MD;  Location: [facility] ENDOSCOPY;  Service: Endoscopy;  Laterality: N/A;   EYE SURGERY Right    for pterygium   FUNCTIONAL ENDOSCOPIC SINUS SURGERY     TONSILLECTOMY      FAMILY HISTORY: Family History  Problem Relation Age of Onset   Stomach cancer Mother    Colon cancer Brother    Breast cancer Neg Hx      ADVANCED DIRECTIVES (Y/N):  N  HEALTH MAINTENANCE: Social History   Tobacco Use   Smoking status: Never   Smokeless tobacco: Never  Vaping Use   Vaping Use: Never used  Substance Use Topics   Alcohol use: No   Drug use: Never     Colonoscopy:  PAP:  Bone density:  Lipid panel:  No Known Allergies  Current Outpatient Medications  Medication Sig Dispense Refill   alum & mag hydroxide-simeth (MAALOX/MYLANTA) 200-200-20 MG/5ML suspension Take 30 mLs by mouth every 6 (six) hours as needed for indigestion or heartburn.     amLODipine (NORVASC) 5 MG tablet Take 5 mg by mouth daily.     aspirin 325 MG EC tablet Take 325 mg by mouth daily.     atorvastatin (LIPITOR) 80 MG tablet Take 80 mg by mouth daily.     calcium carbonate (TUMS - DOSED IN MG ELEMENTAL CALCIUM) 500 MG chewable tablet Chew 1,000 mg by mouth 3 (three) times daily as needed for indigestion or heartburn.     Carboxymethylcellulose Sodium (ARTIFICIAL TEARS OP) Place 1 drop into both eyes daily.     cetirizine (ZYRTEC) 10 MG tablet Take 10 mg by mouth daily.     dorzolamide-timolol (COSOPT) 22.3-6.8 MG/ML ophthalmic solution Place 1 drop into both eyes 2 (two) times daily.     fluticasone (FLONASE) 50 MCG/ACT nasal spray Place 2 sprays into both nostrils daily.     glipiZIDE (GLUCOTROL XL) 5 MG 24 hr tablet Take 5 mg by mouth daily.     HYDROcodone-acetaminophen (NORCO) 7.5-325 MG tablet Take 1 tablet by mouth 2 (two) times daily.     ibuprofen (ADVIL,MOTRIN) 200 MG tablet Take 200 mg by mouth every 6 (six) hours as needed for headache or moderate pain.     latanoprost (XALATAN) 0.005 % ophthalmic solution Place 1 drop into both eyes at bedtime.     letrozole (FEMARA) 2.5 MG tablet Take 1 tablet (2.5 mg total) by mouth daily. 90 tablet 3   lisinopril (PRINIVIL,ZESTRIL) 10 MG tablet Take 10 mg by mouth daily.     metFORMIN (GLUCOPHAGE) 500 MG tablet Take 1,000 mg by mouth 2 (two) times daily.     Multiple Vitamin  (DAILY-VITE MULTIVITAMIN) TABS Take 1 tablet by mouth daily.     Multiple Vitamin (MULTIVITAMIN) capsule Take 1 capsule by mouth daily.     OXYGEN Inhale 2 L into the lungs at bedtime.     phenylephrine (SUDAFED PE) 10 MG TABS tablet Take 10 mg by mouth at bedtime.     pregabalin (LYRICA) 100 MG capsule Take 100 mg by mouth 3 (three) times daily.     sertraline (ZOLOFT) 100 MG tablet Take 100 mg by mouth daily.     silver sulfADIAZINE (SILVADENE) 1 % cream Apply 1 application topically daily. 50 g 0   tolterodine (DETROL LA) 4 MG 24 hr capsule Take 4 mg by mouth daily.     pantoprazole (PROTONIX) 40 MG tablet Take 1 tablet (40 mg total) by mouth daily.  30 tablet 1   No current facility-administered medications for this visit.    OBJECTIVE: Vitals:   07/24/21 1051  BP: 115/69  Pulse: 64  Resp: 16  Temp: (!) 96.6 F (35.9 C)     Body mass index is 38.94 kg/m.    ECOG FS:0 - Asymptomatic  General: Well-developed, well-nourished, no acute distress.  Sitting in a wheelchair. Eyes: Pink conjunctiva, anicteric sclera. HEENT: Normocephalic, moist mucous membranes. Breast: Mild erythema of left breast. Lungs: No audible wheezing or coughing. Heart: Regular rate and rhythm. Abdomen: Soft, nontender, no obvious distention. Musculoskeletal: No edema, cyanosis, or clubbing. Neuro: Alert, answering all questions appropriately. Cranial nerves grossly intact. Skin: No rashes or petechiae noted. Psych: Normal affect.  LAB RESULTS:  Lab Results  Component Value Date   NA 134 (L) 05/16/2021   K 4.4 05/16/2021   CL 101 05/16/2021   CO2 25 05/16/2021   GLUCOSE 203 (H) 05/16/2021   BUN 17 05/16/2021   CREATININE 0.53 05/16/2021   CALCIUM 8.9 05/16/2021   PROT 7.3 05/16/2021   ALBUMIN 4.1 05/16/2021   AST 18 05/16/2021   ALT 17 05/16/2021   ALKPHOS 62 05/16/2021   BILITOT 0.7 05/16/2021   GFRNONAA >60 05/16/2021   GFRAA >60 08/23/2017    Lab Results  Component Value Date   WBC  6.9 07/18/2021   NEUTROABS 4.2 05/16/2021   HGB 11.1 (L) 07/18/2021   HCT 35.9 (L) 07/18/2021   MCV 91.8 07/18/2021   PLT 180 07/18/2021     STUDIES: No results found.   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  Oncotype DX score 0.  PLAN:    1. 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  Given her Oncotype DX score of 0, adjuvant chemotherapy was not necessary.  Patient completed adjuvant XRT on July 23, 2021.  She was given a prescription for letrozole today which she will take for 5 years completing treatment in August 2027.  Will get baseline bone mineral density in the next 1 to 2 weeks.  Return to clinic in 3 months for routine evaluation.    I spent a total of 30 minutes reviewing chart data, face-to-face evaluation with the patient, counseling and coordination of care as detailed above.   Patient expressed understanding and was in agreement with this plan. She also understands that She can call clinic at any time with any questions, concerns, or complaints.   Cancer Staging Carcinoma of upper-outer quadrant of female breast, left [facility]) Staging form: Breast, AJCC 8th Edition - Clinical stage from 05/02/2021: Stage IA (cT1b, cN0, cM0, G1, ER+, PR+, HER2-) - Signed by Lloyd Huger, MD on 05/07/2021 Histologic grading system: 3 grade system   Lloyd Huger, MD   07/24/2021 11:36 AM

## 2021-07-22 ENCOUNTER — Ambulatory Visit
Admission: RE | Admit: 2021-07-22 | Discharge: 2021-07-22 | Disposition: A | Discharge status: Home / Self Care | Payer: Medicare HMO | Source: Ambulatory Visit | Attending: Radiation Oncology | Admitting: Radiation Oncology

## 2021-07-22 DIAGNOSIS — C50412 Malignant neoplasm of upper-outer quadrant of left female breast: Secondary | ICD-10-CM | POA: Diagnosis present

## 2021-07-22 DIAGNOSIS — Z170 Estrogen receptor positive status [ER+]: Secondary | ICD-10-CM | POA: Insufficient documentation

## 2021-07-22 DIAGNOSIS — Z510 Encounter for antineoplastic radiation therapy: Secondary | ICD-10-CM | POA: Diagnosis present

## 2021-07-23 ENCOUNTER — Ambulatory Visit
Admission: RE | Admit: 2021-07-23 | Discharge: 2021-07-23 | Disposition: A | Discharge status: Home / Self Care | Payer: Medicare HMO | Source: Ambulatory Visit | Attending: Radiation Oncology | Admitting: Radiation Oncology

## 2021-07-23 DIAGNOSIS — Z510 Encounter for antineoplastic radiation therapy: Secondary | ICD-10-CM | POA: Diagnosis not present

## 2021-07-24 ENCOUNTER — Inpatient Hospital Stay: Payer: Medicare HMO | Attending: Oncology | Admitting: Oncology

## 2021-07-24 ENCOUNTER — Encounter: Payer: Self-pay | Admitting: Oncology

## 2021-07-24 VITALS — BP 115/69 | HR 64 | Temp 96.6°F | Resp 16 | Wt 234.0 lb

## 2021-07-24 DIAGNOSIS — C50412 Malignant neoplasm of upper-outer quadrant of left female breast: Secondary | ICD-10-CM | POA: Insufficient documentation

## 2021-07-24 DIAGNOSIS — Z170 Estrogen receptor positive status [ER+]: Secondary | ICD-10-CM | POA: Insufficient documentation

## 2021-07-24 DIAGNOSIS — Z923 Personal history of irradiation: Secondary | ICD-10-CM | POA: Insufficient documentation

## 2021-07-24 MED ORDER — LETROZOLE 2.5 MG PO TABS: 2.5000 mg | ORAL_TABLET | Freq: Every day | ORAL | 3 refills | Status: DC

## 2021-07-24 NOTE — Progress Notes (Signed)
Patient denies new problems/concerns today.   °

## 2021-07-30 ENCOUNTER — Ambulatory Visit
Admission: RE | Admit: 2021-07-30 | Discharge: 2021-07-30 | Disposition: A | Discharge status: Home / Self Care | Payer: Medicare HMO | Source: Ambulatory Visit | Attending: Oncology | Admitting: Oncology

## 2021-07-30 ENCOUNTER — Other Ambulatory Visit: Payer: Self-pay

## 2021-07-30 DIAGNOSIS — Z780 Asymptomatic menopausal state: Secondary | ICD-10-CM | POA: Insufficient documentation

## 2021-07-30 DIAGNOSIS — C50412 Malignant neoplasm of upper-outer quadrant of left female breast: Secondary | ICD-10-CM

## 2021-07-30 DIAGNOSIS — Z13820 Encounter for screening for osteoporosis: Secondary | ICD-10-CM | POA: Insufficient documentation

## 2021-07-30 DIAGNOSIS — Z79899 Other long term (current) drug therapy: Secondary | ICD-10-CM | POA: Insufficient documentation

## 2021-07-30 DIAGNOSIS — M85852 Other specified disorders of bone density and structure, left thigh: Secondary | ICD-10-CM | POA: Insufficient documentation

## 2021-07-30 DIAGNOSIS — E119 Type 2 diabetes mellitus without complications: Secondary | ICD-10-CM | POA: Insufficient documentation

## 2021-07-30 DIAGNOSIS — Z853 Personal history of malignant neoplasm of breast: Secondary | ICD-10-CM | POA: Diagnosis not present

## 2021-07-30 DIAGNOSIS — Z923 Personal history of irradiation: Secondary | ICD-10-CM | POA: Diagnosis not present

## 2021-08-22 ENCOUNTER — Ambulatory Visit
Admission: RE | Admit: 2021-08-22 | Discharge: 2021-08-22 | Disposition: A | Discharge status: Home / Self Care | Payer: Medicare HMO | Source: Ambulatory Visit | Attending: Radiation Oncology | Admitting: Radiation Oncology

## 2021-08-22 VITALS — BP 111/66 | HR 70 | Temp 97.0°F | Resp 16

## 2021-08-22 DIAGNOSIS — C50412 Malignant neoplasm of upper-outer quadrant of left female breast: Secondary | ICD-10-CM | POA: Insufficient documentation

## 2021-08-22 DIAGNOSIS — Z923 Personal history of irradiation: Secondary | ICD-10-CM | POA: Diagnosis not present

## 2021-08-22 DIAGNOSIS — Z170 Estrogen receptor positive status [ER+]: Secondary | ICD-10-CM | POA: Diagnosis not present

## 2021-08-22 DIAGNOSIS — Z79811 Long term (current) use of aromatase inhibitors: Secondary | ICD-10-CM | POA: Diagnosis not present

## 2021-08-22 NOTE — Progress Notes (Signed)
Radiation Oncology Follow up Note  Name: Rhonda Thornton   Date:   08/22/2021 MRN:  [id number] DOB: September 25, 1951    This 70 y.o. female presents to the clinic today for 1 month follow-up status post whole breast radiation to her left breast for stage Ia (T1b N0 M0) ER/PR positive invasive mammary carcinoma.  REFERRING PROVIDER: Denton Lank, MD  HPI: Patient is a 70 year old female now at 1 month having completed whole breast radiation to her left breast for ER/PR positive stage Ia invasive mammary carcinoma.  Seen today in routine follow-up she is doing well.  She specifically denies breast tenderness cough or bone pain.  She was having some significant discomfort in the breast early on that has resolved.  Still somewhat erythematous especially around the nipple areolar complex..  She has been started on Femara tolerant well without side effect  COMPLICATIONS OF TREATMENT: none  FOLLOW UP COMPLIANCE: keeps appointments   PHYSICAL EXAM:  BP 111/66   Pulse 70   Temp (!) 97 F (36.1 C) (Tympanic)   Resp 16  Lungs are clear to A&P cardiac examination essentially unremarkable with regular rate and rhythm. No dominant mass or nodularity is noted in either breast in 2 positions examined. Incision is well-healed. No axillary or supraclavicular adenopathy is appreciated. Cosmetic result is excellent.  Still some slight erythema around the nipple.  Well-developed well-nourished patient in NAD. HEENT reveals PERLA, EOMI, discs not visualized.  Oral cavity is clear. No oral mucosal lesions are identified. Neck is clear without evidence of cervical or supraclavicular adenopathy. Lungs are clear to A&P. Cardiac examination is essentially unremarkable with regular rate and rhythm without murmur rub or thrill. Abdomen is benign with no organomegaly or masses noted. Motor sensory and DTR levels are equal and symmetric in the upper and lower extremities. Cranial nerves II through XII are grossly intact.  Proprioception is intact. No peripheral adenopathy or edema is identified. No motor or sensory levels are noted. Crude visual fields are within normal range.  RADIOLOGY RESULTS: No current films to review  PLAN: Present time patient is doing well 1 month out from whole breast radiation and pleased with her overall progress.  Of asked to see her back in 4 to 5 months for follow-up.  She continues on Femara without side effect.  Patient knows to call with any concerns.  I would like to take this opportunity to thank you for allowing me to participate in the care of your patient.Rhonda Filbert, MD

## 2021-10-24 NOTE — Progress Notes (Signed)
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  Oncotype DX score is 0.  INTERVAL HISTORY: Patient returns to clinic today for routine 61-month evaluation.  She currently feels well and is asymptomatic.  She is tolerating letrozole without significant side effects.  She has no neurologic complaints.  She denies any recent fevers or illnesses.  She has a good appetite and denies weight loss.  She has no chest pain, shortness of breath, cough, or hemoptysis.  She denies any nausea, vomiting, constipation, or diarrhea.  She has no urinary complaints.  Patient offers no specific complaints today.  REVIEW OF SYSTEMS:   Review of Systems  Constitutional: Negative.  Negative for fever, malaise/fatigue and weight loss.  Respiratory: Negative.  Negative for hemoptysis and shortness of breath.   Cardiovascular: Negative.  Negative for chest pain and leg swelling.  Gastrointestinal: Negative.  Negative for abdominal pain.  Genitourinary: Negative.  Negative for dysuria.  Musculoskeletal: Negative.  Negative for back pain.  Skin: Negative.  Negative for rash.  Neurological: Negative.  Negative for dizziness, focal weakness, weakness and headaches.  Psychiatric/Behavioral: Negative.  The patient is not nervous/anxious.    As per HPI. Otherwise, a complete review of systems is negative.  PAST MEDICAL HISTORY: Past Medical History:  Diagnosis Date   Anemia    Anginal  pain (HCC)    Arthritis    Chronic pain syndrome    Degenerative disc disease, lumbar    Edema of left lower extremity    Gastroparesis due to secondary diabetes (HCC)    GERD (gastroesophageal reflux disease)    Heart murmur    History of seizure disorder    Hyperlipidemia    Hypertension    Invasive carcinoma of breast ([place]) 04/25/2021   LEFT; stage 1A; grade I invasive mammary carcinoma; ER/PR (+); HER2/neu (-)   Mild aortic stenosis    Mild mitral regurgitation    Mild tricuspid regurgitation    Morbid obesity (HCC)    Opioid use    therapeutic use for Dx of chronic pain syndrome   OSA (obstructive sleep apnea)    Recurrent falls while walking    uses rolling walker    Requires supplemental oxygen    2L/[place] at bedtime   T2DM (type 2 diabetes mellitus) ([place])    Tubular adenoma of colon     PAST SURGICAL HISTORY: Past Surgical History:  Procedure Laterality Date   ABDOMINAL HYSTERECTOMY  1991   partial   BREAST BIOPSY Left 0505/2022   u/s bx-"X" clip-INVASIVE MAMMARY CARCINOMA WITH TUBULAR FEATURES   BREAST CYST ASPIRATION Right 07/16/2016   neg   BREAST LUMPECTOMY,RADIO FREQ LOCALIZER,AXILLARY SENTINEL LYMPH NODE BIOPSY Left 05/22/2021   Procedure: BREAST LUMPECTOMY,RADIO FREQ LOCALIZER,AXILLARY SENTINEL LYMPH NODE BIOPSY;  Surgeon: Ronny Bacon, MD;  Location: [facility] ORS;  Service: General;  Laterality: Left;   CHOLECYSTECTOMY     COLONOSCOPY WITH PROPOFOL N/A 07/30/2016   Procedure: COLONOSCOPY WITH PROPOFOL;  Surgeon: Lollie Sails, MD;  Location: [facility] ENDOSCOPY;  Service: Endoscopy;  Laterality: N/A;   ESOPHAGOGASTRODUODENOSCOPY (  EGD) WITH PROPOFOL N/A 09/09/2017   Procedure: ESOPHAGOGASTRODUODENOSCOPY (EGD) WITH PROPOFOL;  Surgeon: Toledo, Benay Pike, MD;  Location: [facility] ENDOSCOPY;  Service: Endoscopy;  Laterality: N/A;   EYE SURGERY Right    for pterygium   FUNCTIONAL ENDOSCOPIC SINUS SURGERY     TONSILLECTOMY      FAMILY HISTORY: Family History  Problem  Relation Age of Onset   Stomach cancer Mother    Colon cancer Brother    Breast cancer Neg Hx     ADVANCED DIRECTIVES (Y/N):  N  HEALTH MAINTENANCE: Social History   Tobacco Use   Smoking status: Never   Smokeless tobacco: Never  Vaping Use   Vaping Use: Never used  Substance Use Topics   Alcohol use: No   Drug use: Never     Colonoscopy:  PAP:  Bone density:  Lipid panel:  No Known Allergies  Current Outpatient Medications  Medication Sig Dispense Refill   alum & mag hydroxide-simeth (MAALOX/MYLANTA) 200-200-20 MG/5ML suspension Take 30 mLs by mouth every 6 (six) hours as needed for indigestion or heartburn.     amLODipine (NORVASC) 5 MG tablet Take 5 mg by mouth daily.     aspirin 325 MG EC tablet Take 325 mg by mouth daily.     atorvastatin (LIPITOR) 80 MG tablet Take 80 mg by mouth daily.     calcium carbonate (TUMS - DOSED IN MG ELEMENTAL CALCIUM) 500 MG chewable tablet Chew 1,000 mg by mouth 3 (three) times daily as needed for indigestion or heartburn.     Carboxymethylcellulose Sodium (ARTIFICIAL TEARS OP) Place 1 drop into both eyes daily.     cetirizine (ZYRTEC) 10 MG tablet Take 10 mg by mouth daily.     dorzolamide-timolol (COSOPT) 22.3-6.8 MG/ML ophthalmic solution Place 1 drop into both eyes 2 (two) times daily.     fluticasone (FLONASE) 50 MCG/ACT nasal spray Place 2 sprays into both nostrils daily.     glipiZIDE (GLUCOTROL XL) 5 MG 24 hr tablet Take 5 mg by mouth daily.     HYDROcodone-acetaminophen (NORCO) 7.5-325 MG tablet Take 1 tablet by mouth 2 (two) times daily.     ibuprofen (ADVIL,MOTRIN) 200 MG tablet Take 200 mg by mouth every 6 (six) hours as needed for headache or moderate pain.     latanoprost (XALATAN) 0.005 % ophthalmic solution Place 1 drop into both eyes at bedtime.     letrozole (FEMARA) 2.5 MG tablet Take 1 tablet (2.5 mg total) by mouth daily. 90 tablet 3   lisinopril (PRINIVIL,ZESTRIL) 10 MG tablet Take 10 mg by mouth daily.      metFORMIN (GLUCOPHAGE) 500 MG tablet Take 1,000 mg by mouth 2 (two) times daily.     Multiple Vitamin (DAILY-VITE MULTIVITAMIN) TABS Take 1 tablet by mouth daily.     Multiple Vitamin (MULTIVITAMIN) capsule Take 1 capsule by mouth daily.     OXYGEN Inhale 2 L into the lungs at bedtime.     phenylephrine (SUDAFED PE) 10 MG TABS tablet Take 10 mg by mouth at bedtime.     pregabalin (LYRICA) 100 MG capsule Take 100 mg by mouth 3 (three) times daily.     sertraline (ZOLOFT) 100 MG tablet Take 100 mg by mouth daily.     tolterodine (DETROL LA) 4 MG 24 hr capsule Take 4 mg by mouth daily.     pantoprazole (PROTONIX) 40 MG tablet Take 1 tablet (40 mg total) by mouth daily. 30 tablet 1   silver sulfADIAZINE (SILVADENE) 1 %  cream Apply 1 application topically daily. (Patient not taking: Reported on 10/29/2021) 50 g 0   No current facility-administered medications for this visit.    OBJECTIVE: Vitals:   10/29/21 1403  BP: (!) 143/73  Pulse: 71  Resp: 16  Temp: (!) 97.5 F (36.4 C)  SpO2: 98%     Body mass index is 39.8 kg/m.    ECOG FS:0 - Asymptomatic  General: Well-developed, well-nourished, no acute distress.  Sitting in wheelchair. Eyes: Pink conjunctiva, anicteric sclera. HEENT: Normocephalic, moist mucous membranes. Breast: Exam deferred today. Lungs: No audible wheezing or coughing. Heart: Regular rate and rhythm. Abdomen: Soft, nontender, no obvious distention. Musculoskeletal: No edema, cyanosis, or clubbing. Neuro: Alert, answering all questions appropriately. Cranial nerves grossly intact. Skin: No rashes or petechiae noted. Psych: Normal affect.   LAB RESULTS:  Lab Results  Component Value Date   NA 134 (L) 05/16/2021   K 4.4 05/16/2021   CL 101 05/16/2021   CO2 25 05/16/2021   GLUCOSE 203 (H) 05/16/2021   BUN 17 05/16/2021   CREATININE 0.53 05/16/2021   CALCIUM 8.9 05/16/2021   PROT 7.3 05/16/2021   ALBUMIN 4.1 05/16/2021   AST 18 05/16/2021   ALT 17 05/16/2021    ALKPHOS 62 05/16/2021   BILITOT 0.7 05/16/2021   GFRNONAA >60 05/16/2021   GFRAA >60 08/23/2017    Lab Results  Component Value Date   WBC 6.9 07/18/2021   NEUTROABS 4.2 05/16/2021   HGB 11.1 (L) 07/18/2021   HCT 35.9 (L) 07/18/2021   MCV 91.8 07/18/2021   PLT 180 07/18/2021     STUDIES: No results found.   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  Oncotype DX score 0.  PLAN:    1. 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  Given her Oncotype DX score of 0, adjuvant chemotherapy was not necessary.  Patient completed adjuvant XRT on July 23, 2021.  Continue letrozole for total of 5 years completing treatment in August 2027.  No further intervention is needed.  Return to clinic in 6 months for routine evaluation.   2.  Osteopenia: Baseline bone mineral density on July 30, 2021 revealed a T score of -1.2.  Continue calcium and vitamin D supplementation.  Repeat in August 2023.   Patient expressed understanding and was in agreement with this plan. She also understands that She can call clinic at any time with any questions, concerns, or complaints.   Cancer Staging Carcinoma of upper-outer quadrant of female breast, left [facility]) Staging form: Breast, AJCC 8th Edition - Clinical stage from 05/02/2021: Stage IA (cT1b, cN0, cM0, G1, ER+, PR+, HER2-) - Signed by Lloyd Huger, MD on 05/07/2021 Histologic grading system: 3 grade system   Lloyd Huger, MD   10/29/2021 6:22 PM

## 2021-10-28 ENCOUNTER — Other Ambulatory Visit: Payer: Self-pay

## 2021-10-28 DIAGNOSIS — C50412 Malignant neoplasm of upper-outer quadrant of left female breast: Secondary | ICD-10-CM

## 2021-10-29 ENCOUNTER — Inpatient Hospital Stay: Payer: Medicare HMO | Attending: Oncology | Admitting: Oncology

## 2021-10-29 ENCOUNTER — Other Ambulatory Visit: Payer: Self-pay

## 2021-10-29 ENCOUNTER — Ambulatory Visit: Payer: Medicare HMO | Admitting: Oncology

## 2021-10-29 VITALS — BP 143/73 | HR 71 | Temp 97.5°F | Resp 16 | Wt 239.2 lb

## 2021-10-29 DIAGNOSIS — C50412 Malignant neoplasm of upper-outer quadrant of left female breast: Secondary | ICD-10-CM | POA: Insufficient documentation

## 2021-10-29 DIAGNOSIS — Z170 Estrogen receptor positive status [ER+]: Secondary | ICD-10-CM | POA: Diagnosis not present

## 2021-10-29 DIAGNOSIS — M8580 Other specified disorders of bone density and structure, unspecified site: Secondary | ICD-10-CM | POA: Insufficient documentation

## 2021-10-29 DIAGNOSIS — Z923 Personal history of irradiation: Secondary | ICD-10-CM | POA: Insufficient documentation

## 2021-10-29 NOTE — Progress Notes (Signed)
Survivorship Care Plan visit completed.  Treatment summary reviewed and given to patient.  ASCO answers booklet reviewed and given to patient.  CARE program and Cancer Transitions discussed with patient along with other resources cancer center offers to patients and caregivers.  Patient verbalized understanding.    

## 2021-10-29 NOTE — Progress Notes (Signed)
Pt c/o pain to right shoulder and left posterior hip/flank area x 1 month.

## 2021-11-19 DIAGNOSIS — M25511 Pain in right shoulder: Secondary | ICD-10-CM | POA: Insufficient documentation

## 2021-11-19 DIAGNOSIS — M25552 Pain in left hip: Secondary | ICD-10-CM | POA: Insufficient documentation

## 2021-12-05 ENCOUNTER — Other Ambulatory Visit: Payer: Self-pay

## 2021-12-05 ENCOUNTER — Ambulatory Visit
Admission: RE | Admit: 2021-12-05 | Discharge: 2021-12-05 | Disposition: A | Discharge status: Home / Self Care | Payer: Medicare HMO | Source: Ambulatory Visit | Attending: Surgery | Admitting: Surgery

## 2021-12-05 DIAGNOSIS — C50412 Malignant neoplasm of upper-outer quadrant of left female breast: Secondary | ICD-10-CM | POA: Diagnosis not present

## 2021-12-05 HISTORY — DX: Personal history of irradiation: Z92.3

## 2021-12-12 ENCOUNTER — Ambulatory Visit: Payer: Medicare HMO | Admitting: Surgery

## 2021-12-12 ENCOUNTER — Other Ambulatory Visit: Payer: Self-pay

## 2021-12-12 ENCOUNTER — Encounter: Payer: Self-pay | Admitting: Surgery

## 2021-12-12 VITALS — BP 137/82 | HR 82 | Temp 99.3°F | Ht 64.0 in | Wt 244.4 lb

## 2021-12-12 DIAGNOSIS — N6489 Other specified disorders of breast: Secondary | ICD-10-CM | POA: Insufficient documentation

## 2021-12-12 NOTE — Progress Notes (Signed)
[facility] °POST-OP OFFICE VISIT ° °12/12/2021 ° °HPI: °Rhonda Thornton is a 70 y.o. female 6 months s/p left breast lumpectomy.  Has had persistent swelling/left breast enlargement since surgery.  No history of fevers or chills.  No history of drainage, erythema.  She finds the moderate enlargement of the surgical area of her upper outer left breast to be a bit of an annoyance.  Also notes bra fitting challenges. 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  Given her Oncotype DX score of 0, adjuvant chemotherapy was not necessary.  Patient completed adjuvant XRT on July 23, 2021.  Continue letrozole for total of 5 years completing treatment in August 2027. °Vital signs: °BP 137/82    Pulse 82    Temp 99.3 °F (37.4 °C) (Oral)    Ht 5' 4" (1.626 m)    Wt 244 lb 6.4 oz (110.9 kg)    SpO2 97%    BMI 41.95 kg/m²   ° °Physical Exam: °Constitutional: She appears well, at her baseline. °Skin: Left breast incision is well-healed, however there is a prominent seroma present with some additional edema, likely dependent to the left breast with some hyperemic changes consistent with a combination likely of vascular/venous congestion and postradiation changes. °Reviewed her recent imaging.  And it all appears consistent with benign postoperative changes.  There is a large multiseptated seroma present at the site of concern. ° °Assessment/Plan: °This is a 70 y.o. female 6 months days s/p left breast lumpectomy/breast conservation. 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  We also discussed the likely recurrence of the seroma cavity without utilization a longstanding drain.  We felt the drain and the risks associated with it were not worthwhile, especially considering her recently completed radiation changes. °I suggested we defer any intervention at this point in time anticipating repeat imaging in April of next year and revisit the potential for eating through needle aspiration after an additional few months have passed. °I believe she agrees this is likely the best scenario, unfortunately the inconvenience and discomfort will likely remain throughout this interval and she has excepted this discomfort, considering the potential risks involved with attempted aspiration/drainage. °I anticipate following her up in April after follow-up imaging, or sooner as needed should pain or discomfort severity worsen. ° ° °Denny Rodenberg M.D., FACS °12/12/2021, 10:23 AM  °

## 2021-12-12 NOTE — Patient Instructions (Addendum)
We will see you back here in April after your mammogram and we will reevaluate this area.  We are hoping with time that this will get better.

## 2022-01-21 ENCOUNTER — Other Ambulatory Visit: Payer: Self-pay | Admitting: Orthopedic Surgery

## 2022-01-21 DIAGNOSIS — M170 Bilateral primary osteoarthritis of knee: Secondary | ICD-10-CM

## 2022-01-23 ENCOUNTER — Encounter: Payer: Self-pay | Admitting: Radiation Oncology

## 2022-01-23 ENCOUNTER — Ambulatory Visit
Admission: RE | Admit: 2022-01-23 | Discharge: 2022-01-23 | Disposition: A | Discharge status: Home / Self Care | Payer: Medicare HMO | Source: Ambulatory Visit | Attending: Orthopedic Surgery | Admitting: Orthopedic Surgery

## 2022-01-23 ENCOUNTER — Other Ambulatory Visit: Payer: Self-pay

## 2022-01-23 ENCOUNTER — Ambulatory Visit
Admission: RE | Admit: 2022-01-23 | Discharge: 2022-01-23 | Disposition: A | Discharge status: Home / Self Care | Payer: Medicare HMO | Source: Ambulatory Visit | Attending: Radiation Oncology | Admitting: Radiation Oncology

## 2022-01-23 VITALS — BP 120/55 | HR 70 | Temp 98.0°F | Resp 18

## 2022-01-23 DIAGNOSIS — N6489 Other specified disorders of breast: Secondary | ICD-10-CM | POA: Diagnosis not present

## 2022-01-23 DIAGNOSIS — Z79811 Long term (current) use of aromatase inhibitors: Secondary | ICD-10-CM | POA: Diagnosis not present

## 2022-01-23 DIAGNOSIS — C50412 Malignant neoplasm of upper-outer quadrant of left female breast: Secondary | ICD-10-CM | POA: Insufficient documentation

## 2022-01-23 DIAGNOSIS — M170 Bilateral primary osteoarthritis of knee: Secondary | ICD-10-CM | POA: Insufficient documentation

## 2022-01-23 DIAGNOSIS — Z170 Estrogen receptor positive status [ER+]: Secondary | ICD-10-CM | POA: Insufficient documentation

## 2022-01-23 DIAGNOSIS — Z923 Personal history of irradiation: Secondary | ICD-10-CM | POA: Insufficient documentation

## 2022-01-23 NOTE — Progress Notes (Signed)
Radiation Oncology Follow up Note  Name: Rhonda Thornton   Date:   01/23/2022 MRN:  [id number] DOB: May 13, 1951    This 71 y.o. female pre 32-month follow-up status post whole breast radiation to her left breast for stage Ia (T1b N0 M0) ER/PR positive invasive mammary carcinoma sents to the clinic today for .  REFERRING PROVIDER: Denton Lank, MD  HPI: Patient is a 71 year old female now out 6 months having completed whole breast radiation to her left breast for stage Ia ER/PR positive invasive mammary carcinoma.  Seen today in routine follow-up she is doing well.  She does have some tenderness in her left breast although she has a large seroma which is being followed by her surgeon.  She otherwise specifically denies cough or bone pain..  She had a recent CT scan and ultrasound which I have reviewed showing a large complex fluid collection in the upper outer quadrant consistent with seroma measuring up to 11 cm.  Of note it may be difficult to drain based on the multiple thick separations and complex fluid.  She is currently on Femara tolerating it well without side effect.  COMPLICATIONS OF TREATMENT: none  FOLLOW UP COMPLIANCE: keeps appointments   PHYSICAL EXAM:  BP (!) 120/55    Pulse 70    Temp 98 F (36.7 C)    Resp 18  Left breast is a large seroma.  There are some slight erythematous change of the breast although no other dominant masses noted in either breast no axillary or supraclavicular adenopathy is appreciated.  Well-developed well-nourished patient in NAD. HEENT reveals PERLA, EOMI, discs not visualized.  Oral cavity is clear. No oral mucosal lesions are identified. Neck is clear without evidence of cervical or supraclavicular adenopathy. Lungs are clear to A&P. Cardiac examination is essentially unremarkable with regular rate and rhythm without murmur rub or thrill. Abdomen is benign with no organomegaly or masses noted. Motor sensory and DTR levels are equal and symmetric in the  upper and lower extremities. Cranial nerves II through XII are grossly intact. Proprioception is intact. No peripheral adenopathy or edema is identified. No motor or sensory levels are noted. Crude visual fields are within normal range.  RADIOLOGY RESULTS: Mammogram and ultrasound reviewed compatible with above-stated findings  PLAN: Present time patient is doing well.  She is being followed by her surgeon for the seroma which may need to be evacuated at some time should symptoms persist.  She continues on Femara without side effect.  I have asked to see her back in 6 months for follow-up.  Patient knows to call with any concerns.  I would like to take this opportunity to thank you for allowing me to participate in the care of your patient.[name], MD

## 2022-02-08 ENCOUNTER — Emergency Department: Payer: Medicare HMO

## 2022-02-08 ENCOUNTER — Inpatient Hospital Stay: Admit: 2022-02-08 | Payer: Medicare HMO

## 2022-02-08 ENCOUNTER — Inpatient Hospital Stay
Admission: EM | Admit: 2022-02-08 | Discharge: 2022-02-18 | DRG: 872 | Disposition: A | Discharge status: Skilled Nursing Facility | Payer: Medicare HMO | Attending: Family Medicine | Admitting: Family Medicine

## 2022-02-08 ENCOUNTER — Other Ambulatory Visit: Payer: Self-pay

## 2022-02-08 DIAGNOSIS — Z923 Personal history of irradiation: Secondary | ICD-10-CM

## 2022-02-08 DIAGNOSIS — E119 Type 2 diabetes mellitus without complications: Secondary | ICD-10-CM

## 2022-02-08 DIAGNOSIS — Z9049 Acquired absence of other specified parts of digestive tract: Secondary | ICD-10-CM

## 2022-02-08 DIAGNOSIS — R296 Repeated falls: Secondary | ICD-10-CM | POA: Diagnosis present

## 2022-02-08 DIAGNOSIS — Z91199 Patient's noncompliance with other medical treatment and regimen due to unspecified reason: Secondary | ICD-10-CM | POA: Diagnosis not present

## 2022-02-08 DIAGNOSIS — N6489 Other specified disorders of breast: Secondary | ICD-10-CM

## 2022-02-08 DIAGNOSIS — Z7982 Long term (current) use of aspirin: Secondary | ICD-10-CM

## 2022-02-08 DIAGNOSIS — C50412 Malignant neoplasm of upper-outer quadrant of left female breast: Secondary | ICD-10-CM | POA: Diagnosis present

## 2022-02-08 DIAGNOSIS — L7634 Postprocedural seroma of skin and subcutaneous tissue following other procedure: Secondary | ICD-10-CM | POA: Diagnosis present

## 2022-02-08 DIAGNOSIS — Z9981 Dependence on supplemental oxygen: Secondary | ICD-10-CM

## 2022-02-08 DIAGNOSIS — Z800 Family history of malignant neoplasm of digestive organs: Secondary | ICD-10-CM

## 2022-02-08 DIAGNOSIS — J441 Chronic obstructive pulmonary disease with (acute) exacerbation: Secondary | ICD-10-CM | POA: Diagnosis present

## 2022-02-08 DIAGNOSIS — F32A Depression, unspecified: Secondary | ICD-10-CM | POA: Diagnosis present

## 2022-02-08 DIAGNOSIS — W19XXXA Unspecified fall, initial encounter: Secondary | ICD-10-CM | POA: Diagnosis present

## 2022-02-08 DIAGNOSIS — G4730 Sleep apnea, unspecified: Secondary | ICD-10-CM | POA: Diagnosis present

## 2022-02-08 DIAGNOSIS — Y838 Other surgical procedures as the cause of abnormal reaction of the patient, or of later complication, without mention of misadventure at the time of the procedure: Secondary | ICD-10-CM | POA: Diagnosis present

## 2022-02-08 DIAGNOSIS — Z79899 Other long term (current) drug therapy: Secondary | ICD-10-CM

## 2022-02-08 DIAGNOSIS — G4733 Obstructive sleep apnea (adult) (pediatric): Secondary | ICD-10-CM | POA: Diagnosis present

## 2022-02-08 DIAGNOSIS — I10 Essential (primary) hypertension: Secondary | ICD-10-CM | POA: Diagnosis present

## 2022-02-08 DIAGNOSIS — Z90711 Acquired absence of uterus with remaining cervical stump: Secondary | ICD-10-CM

## 2022-02-08 DIAGNOSIS — Z170 Estrogen receptor positive status [ER+]: Secondary | ICD-10-CM

## 2022-02-08 DIAGNOSIS — I483 Typical atrial flutter: Secondary | ICD-10-CM | POA: Diagnosis not present

## 2022-02-08 DIAGNOSIS — I4892 Unspecified atrial flutter: Secondary | ICD-10-CM | POA: Diagnosis present

## 2022-02-08 DIAGNOSIS — E1165 Type 2 diabetes mellitus with hyperglycemia: Secondary | ICD-10-CM | POA: Insufficient documentation

## 2022-02-08 DIAGNOSIS — I350 Nonrheumatic aortic (valve) stenosis: Secondary | ICD-10-CM | POA: Diagnosis present

## 2022-02-08 DIAGNOSIS — Z79811 Long term (current) use of aromatase inhibitors: Secondary | ICD-10-CM

## 2022-02-08 DIAGNOSIS — T792XXA Traumatic secondary and recurrent hemorrhage and seroma, initial encounter: Secondary | ICD-10-CM

## 2022-02-08 DIAGNOSIS — Z79890 Hormone replacement therapy (postmenopausal): Secondary | ICD-10-CM

## 2022-02-08 DIAGNOSIS — Z7984 Long term (current) use of oral hypoglycemic drugs: Secondary | ICD-10-CM

## 2022-02-08 DIAGNOSIS — A419 Sepsis, unspecified organism: Secondary | ICD-10-CM | POA: Diagnosis present

## 2022-02-08 DIAGNOSIS — G894 Chronic pain syndrome: Secondary | ICD-10-CM | POA: Diagnosis present

## 2022-02-08 DIAGNOSIS — J9611 Chronic respiratory failure with hypoxia: Secondary | ICD-10-CM | POA: Diagnosis present

## 2022-02-08 DIAGNOSIS — I4891 Unspecified atrial fibrillation: Secondary | ICD-10-CM | POA: Diagnosis present

## 2022-02-08 DIAGNOSIS — A411 Sepsis due to other specified staphylococcus: Secondary | ICD-10-CM | POA: Diagnosis present

## 2022-02-08 DIAGNOSIS — R238 Other skin changes: Secondary | ICD-10-CM | POA: Diagnosis not present

## 2022-02-08 DIAGNOSIS — N61 Mastitis without abscess: Secondary | ICD-10-CM | POA: Diagnosis present

## 2022-02-08 DIAGNOSIS — E785 Hyperlipidemia, unspecified: Secondary | ICD-10-CM | POA: Diagnosis present

## 2022-02-08 DIAGNOSIS — I361 Nonrheumatic tricuspid (valve) insufficiency: Secondary | ICD-10-CM | POA: Diagnosis not present

## 2022-02-08 DIAGNOSIS — Y92009 Unspecified place in unspecified non-institutional (private) residence as the place of occurrence of the external cause: Secondary | ICD-10-CM | POA: Diagnosis not present

## 2022-02-08 DIAGNOSIS — G40909 Epilepsy, unspecified, not intractable, without status epilepticus: Secondary | ICD-10-CM | POA: Diagnosis present

## 2022-02-08 DIAGNOSIS — Y842 Radiological procedure and radiotherapy as the cause of abnormal reaction of the patient, or of later complication, without mention of misadventure at the time of the procedure: Secondary | ICD-10-CM | POA: Diagnosis present

## 2022-02-08 DIAGNOSIS — Z6841 Body Mass Index (BMI) 40.0 and over, adult: Secondary | ICD-10-CM

## 2022-02-08 DIAGNOSIS — K5900 Constipation, unspecified: Secondary | ICD-10-CM | POA: Diagnosis not present

## 2022-02-08 DIAGNOSIS — Z20822 Contact with and (suspected) exposure to covid-19: Secondary | ICD-10-CM | POA: Diagnosis present

## 2022-02-08 DIAGNOSIS — K219 Gastro-esophageal reflux disease without esophagitis: Secondary | ICD-10-CM | POA: Diagnosis present

## 2022-02-08 LAB — GLUCOSE, CAPILLARY
Glucose-Capillary: 158 mg/dL — ABNORMAL HIGH (ref 70–99)
Glucose-Capillary: 175 mg/dL — ABNORMAL HIGH (ref 70–99)
Glucose-Capillary: 209 mg/dL — ABNORMAL HIGH (ref 70–99)

## 2022-02-08 LAB — CBC WITH DIFFERENTIAL/PLATELET
Abs Immature Granulocytes: 0.07 10*3/uL (ref 0.00–0.07)
Basophils Absolute: 0 10*3/uL (ref 0.0–0.1)
Basophils Relative: 0 %
Eosinophils Absolute: 0 10*3/uL (ref 0.0–0.5)
Eosinophils Relative: 0 %
HCT: 35 % — ABNORMAL LOW (ref 36.0–46.0)
Hemoglobin: 11 g/dL — ABNORMAL LOW (ref 12.0–15.0)
Immature Granulocytes: 1 %
Lymphocytes Relative: 5 %
Lymphs Abs: 0.7 10*3/uL (ref 0.7–4.0)
MCH: 27.9 pg (ref 26.0–34.0)
MCHC: 31.4 g/dL (ref 30.0–36.0)
MCV: 88.8 fL (ref 80.0–100.0)
Monocytes Absolute: 0.9 10*3/uL (ref 0.1–1.0)
Monocytes Relative: 7 %
Neutro Abs: 12 10*3/uL — ABNORMAL HIGH (ref 1.7–7.7)
Neutrophils Relative %: 87 %
Platelets: 173 10*3/uL (ref 150–400)
RBC: 3.94 MIL/uL (ref 3.87–5.11)
RDW: 15.2 % (ref 11.5–15.5)
WBC: 13.7 10*3/uL — ABNORMAL HIGH (ref 4.0–10.5)
nRBC: 0 % (ref 0.0–0.2)

## 2022-02-08 LAB — URINALYSIS, COMPLETE (UACMP) WITH MICROSCOPIC
Bacteria, UA: NONE SEEN
Bilirubin Urine: NEGATIVE
Glucose, UA: NEGATIVE mg/dL
Hgb urine dipstick: NEGATIVE
Ketones, ur: NEGATIVE mg/dL
Nitrite: NEGATIVE
Protein, ur: NEGATIVE mg/dL
Specific Gravity, Urine: 1.038 — ABNORMAL HIGH (ref 1.005–1.030)
Squamous Epithelial / HPF: NONE SEEN (ref 0–5)
pH: 6 (ref 5.0–8.0)

## 2022-02-08 LAB — APTT: aPTT: 31 seconds (ref 24–36)

## 2022-02-08 LAB — MAGNESIUM: Magnesium: 1.8 mg/dL (ref 1.7–2.4)

## 2022-02-08 LAB — COMPREHENSIVE METABOLIC PANEL
ALT: 14 U/L (ref 0–44)
AST: 18 U/L (ref 15–41)
Albumin: 3.8 g/dL (ref 3.5–5.0)
Alkaline Phosphatase: 63 U/L (ref 38–126)
Anion gap: 11 (ref 5–15)
BUN: 25 mg/dL — ABNORMAL HIGH (ref 8–23)
CO2: 28 mmol/L (ref 22–32)
Calcium: 9.5 mg/dL (ref 8.9–10.3)
Chloride: 97 mmol/L — ABNORMAL LOW (ref 98–111)
Creatinine, Ser: 0.81 mg/dL (ref 0.44–1.00)
GFR, Estimated: >60 mL/min (ref >60)
Glucose, Bld: 200 mg/dL — ABNORMAL HIGH (ref 70–99)
Potassium: 3.5 mmol/L (ref 3.5–5.1)
Sodium: 136 mmol/L (ref 135–145)
Total Bilirubin: 1.1 mg/dL (ref 0.3–1.2)
Total Protein: 7.7 g/dL (ref 6.5–8.1)

## 2022-02-08 LAB — RESP PANEL BY RT-PCR (FLU A&B, COVID) ARPGX2
Influenza A by PCR: NEGATIVE
Influenza B by PCR: NEGATIVE
SARS Coronavirus 2 by RT PCR: NEGATIVE

## 2022-02-08 LAB — TYPE AND SCREEN
ABO/RH(D): O POS
Antibody Screen: NEGATIVE

## 2022-02-08 LAB — PROCALCITONIN: Procalcitonin: 0.46 ng/mL

## 2022-02-08 LAB — LACTIC ACID, PLASMA: Lactic Acid, Venous: 1.7 mmol/L (ref 0.5–1.9)

## 2022-02-08 LAB — TSH: TSH: 0.744 u[IU]/mL (ref 0.350–4.500)

## 2022-02-08 LAB — TROPONIN I (HIGH SENSITIVITY)
Troponin I (High Sensitivity): 20 ng/L — ABNORMAL HIGH (ref <18)
Troponin I (High Sensitivity): 18 ng/L — ABNORMAL HIGH (ref <18)

## 2022-02-08 LAB — PROTIME-INR
INR: 1.2 (ref 0.8–1.2)
Prothrombin Time: 14.7 seconds (ref 11.4–15.2)

## 2022-02-08 LAB — MRSA NEXT GEN BY PCR, NASAL: MRSA by PCR Next Gen: NOT DETECTED

## 2022-02-08 LAB — HEMOGLOBIN A1C
Hgb A1c MFr Bld: 6.1 % — ABNORMAL HIGH (ref 4.8–5.6)
Mean Plasma Glucose: 128.37 mg/dL

## 2022-02-08 LAB — CBG MONITORING, ED: Glucose-Capillary: 159 mg/dL — ABNORMAL HIGH (ref 70–99)

## 2022-02-08 MED ORDER — SODIUM CHLORIDE 0.9 % IV SOLN: 2.0000 g | Freq: Once | INTRAVENOUS | Status: DC

## 2022-02-08 MED ORDER — SERTRALINE HCL 50 MG PO TABS
100.0000 mg | ORAL_TABLET | Freq: Every day | ORAL | Status: DC
  Administered 2022-02-08 – 2022-02-18 (×11): 100 mg via ORAL
  Filled 2022-02-08 (×11): qty 2

## 2022-02-08 MED ORDER — DILTIAZEM HCL-DEXTROSE 125-5 MG/125ML-% IV SOLN (PREMIX)
5.0000 mg/h | INTRAVENOUS | Status: DC
  Administered 2022-02-08: 5 mg/h via INTRAVENOUS
  Filled 2022-02-08 (×2): qty 125

## 2022-02-08 MED ORDER — ENOXAPARIN SODIUM 120 MG/0.8ML IJ SOSY
1.0000 mg/kg | PREFILLED_SYRINGE | Freq: Two times a day (BID) | INTRAMUSCULAR | Status: DC
  Administered 2022-02-08 – 2022-02-09 (×3): 108 mg via SUBCUTANEOUS
  Filled 2022-02-08 (×4): qty 0.72

## 2022-02-08 MED ORDER — ASPIRIN EC 325 MG PO TBEC
325.0000 mg | DELAYED_RELEASE_TABLET | Freq: Every day | ORAL | Status: DC
  Administered 2022-02-08 – 2022-02-09 (×2): 325 mg via ORAL
  Filled 2022-02-08: qty 1

## 2022-02-08 MED ORDER — FESOTERODINE FUMARATE ER 4 MG PO TB24
4.0000 mg | ORAL_TABLET | Freq: Every day | ORAL | Status: DC
Start: 2022-02-08 — End: 2022-02-18
  Administered 2022-02-08 – 2022-02-18 (×11): 4 mg via ORAL
  Filled 2022-02-08 (×11): qty 1

## 2022-02-08 MED ORDER — FLUTICASONE PROPIONATE 50 MCG/ACT NA SUSP
2.0000 | Freq: Every day | NASAL | Status: DC
  Administered 2022-02-08 – 2022-02-18 (×11): 2 via NASAL
  Filled 2022-02-08: qty 16

## 2022-02-08 MED ORDER — METRONIDAZOLE 500 MG/100ML IV SOLN: 500.0000 mg | Freq: Two times a day (BID) | INTRAVENOUS | Status: DC

## 2022-02-08 MED ORDER — POLYVINYL ALCOHOL 1.4 % OP SOLN
1.0000 [drp] | Freq: Every day | OPHTHALMIC | Status: DC
  Administered 2022-02-08 – 2022-02-18 (×11): 1 [drp] via OPHTHALMIC
  Filled 2022-02-08 (×2): qty 15

## 2022-02-08 MED ORDER — LACTATED RINGERS IV SOLN: INTRAVENOUS | Status: AC

## 2022-02-08 MED ORDER — HYDROCODONE-ACETAMINOPHEN 7.5-325 MG PO TABS
1.0000 | ORAL_TABLET | Freq: Two times a day (BID) | ORAL | Status: DC
  Administered 2022-02-08: 1 via ORAL
  Filled 2022-02-08: qty 1

## 2022-02-08 MED ORDER — ACETAMINOPHEN 500 MG PO TABS
1000.0000 mg | ORAL_TABLET | Freq: Once | ORAL | Status: AC
Start: 2022-02-08 — End: 2022-02-08
  Administered 2022-02-08: 1000 mg via ORAL
  Filled 2022-02-08: qty 2

## 2022-02-08 MED ORDER — ONDANSETRON HCL 4 MG/2ML IJ SOLN
4.0000 mg | Freq: Four times a day (QID) | INTRAMUSCULAR | Status: DC | PRN
  Administered 2022-02-08 – 2022-02-16 (×5): 4 mg via INTRAVENOUS
  Filled 2022-02-08 (×5): qty 2

## 2022-02-08 MED ORDER — SODIUM CHLORIDE 0.9 % IV SOLN: 2.0000 g | Freq: Three times a day (TID) | INTRAVENOUS | Status: DC

## 2022-02-08 MED ORDER — ATORVASTATIN CALCIUM 80 MG PO TABS
80.0000 mg | ORAL_TABLET | Freq: Every evening | ORAL | Status: DC
  Administered 2022-02-08 – 2022-02-17 (×10): 80 mg via ORAL
  Filled 2022-02-08: qty 4
  Filled 2022-02-08 (×9): qty 1

## 2022-02-08 MED ORDER — DORZOLAMIDE HCL-TIMOLOL MAL 2-0.5 % OP SOLN
1.0000 [drp] | Freq: Two times a day (BID) | OPHTHALMIC | Status: DC
  Administered 2022-02-08 – 2022-02-18 (×21): 1 [drp] via OPHTHALMIC
  Filled 2022-02-08 (×2): qty 10

## 2022-02-08 MED ORDER — INSULIN ASPART 100 UNIT/ML IJ SOLN
0.0000 [IU] | Freq: Three times a day (TID) | INTRAMUSCULAR | Status: DC
  Administered 2022-02-08 – 2022-02-09 (×2): 3 [IU] via SUBCUTANEOUS
  Administered 2022-02-09: 2 [IU] via SUBCUTANEOUS
  Administered 2022-02-10 – 2022-02-15 (×15): 3 [IU] via SUBCUTANEOUS
  Administered 2022-02-15 (×2): 5 [IU] via SUBCUTANEOUS
  Administered 2022-02-16 (×3): 3 [IU] via SUBCUTANEOUS
  Administered 2022-02-17: 5 [IU] via SUBCUTANEOUS
  Administered 2022-02-17 – 2022-02-18 (×3): 3 [IU] via SUBCUTANEOUS
  Administered 2022-02-18: 5 [IU] via SUBCUTANEOUS
  Filled 2022-02-08 (×27): qty 1

## 2022-02-08 MED ORDER — VANCOMYCIN HCL 1250 MG/250ML IV SOLN
1250.0000 mg | INTRAVENOUS | Status: DC
  Administered 2022-02-09 – 2022-02-11 (×3): 1250 mg via INTRAVENOUS
  Filled 2022-02-08 (×3): qty 250

## 2022-02-08 MED ORDER — ONDANSETRON HCL 4 MG PO TABS: 4.0000 mg | ORAL_TABLET | Freq: Four times a day (QID) | ORAL | Status: DC | PRN

## 2022-02-08 MED ORDER — LETROZOLE 2.5 MG PO TABS
2.5000 mg | ORAL_TABLET | Freq: Every day | ORAL | Status: DC
  Administered 2022-02-08 – 2022-02-18 (×11): 2.5 mg via ORAL
  Filled 2022-02-08 (×11): qty 1

## 2022-02-08 MED ORDER — VANCOMYCIN HCL IN DEXTROSE 1-5 GM/200ML-% IV SOLN: 1000.0000 mg | Freq: Once | INTRAVENOUS | Status: DC

## 2022-02-08 MED ORDER — ADULT MULTIVITAMIN W/MINERALS CH
1.0000 | ORAL_TABLET | Freq: Every day | ORAL | Status: DC
  Administered 2022-02-08 – 2022-02-18 (×11): 1 via ORAL
  Filled 2022-02-08 (×11): qty 1

## 2022-02-08 MED ORDER — METOPROLOL TARTRATE 5 MG/5ML IV SOLN
5.0000 mg | Freq: Once | INTRAVENOUS | Status: AC
  Administered 2022-02-08: 5 mg via INTRAVENOUS
  Filled 2022-02-08: qty 5

## 2022-02-08 MED ORDER — LACTATED RINGERS IV BOLUS
1000.0000 mL | Freq: Once | INTRAVENOUS | Status: AC
  Administered 2022-02-08: 1000 mL via INTRAVENOUS

## 2022-02-08 MED ORDER — VANCOMYCIN HCL 500 MG/100ML IV SOLN
500.0000 mg | Freq: Once | INTRAVENOUS | Status: AC
  Administered 2022-02-08: 500 mg via INTRAVENOUS
  Filled 2022-02-08: qty 100

## 2022-02-08 MED ORDER — IOHEXOL 300 MG/ML  SOLN
75.0000 mL | Freq: Once | INTRAMUSCULAR | Status: AC | PRN
  Administered 2022-02-08: 75 mL via INTRAVENOUS

## 2022-02-08 MED ORDER — PREGABALIN 50 MG PO CAPS
100.0000 mg | ORAL_CAPSULE | Freq: Three times a day (TID) | ORAL | Status: DC
  Administered 2022-02-08 – 2022-02-18 (×30): 100 mg via ORAL
  Filled 2022-02-08 (×30): qty 2

## 2022-02-08 MED ORDER — LATANOPROST 0.005 % OP SOLN
1.0000 [drp] | Freq: Every day | OPHTHALMIC | Status: DC
  Administered 2022-02-09 – 2022-02-17 (×10): 1 [drp] via OPHTHALMIC
  Filled 2022-02-08: qty 2.5

## 2022-02-08 MED ORDER — CALCIUM GLUCONATE-NACL 1-0.675 GM/50ML-% IV SOLN
1.0000 g | Freq: Once | INTRAVENOUS | Status: AC
  Administered 2022-02-08: 1000 mg via INTRAVENOUS
  Filled 2022-02-08: qty 50

## 2022-02-08 MED ORDER — IBUPROFEN 400 MG PO TABS
400.0000 mg | ORAL_TABLET | Freq: Once | ORAL | Status: DC
Start: 2022-02-08 — End: 2022-02-09
  Filled 2022-02-08: qty 1

## 2022-02-08 MED ORDER — MAGNESIUM SULFATE 2 GM/50ML IV SOLN
2.0000 g | Freq: Once | INTRAVENOUS | Status: AC
  Administered 2022-02-08: 2 g via INTRAVENOUS
  Filled 2022-02-08: qty 50

## 2022-02-08 MED ORDER — LACTATED RINGERS IV BOLUS
1250.0000 mL | Freq: Once | INTRAVENOUS | Status: AC
  Administered 2022-02-08: 1250 mL via INTRAVENOUS

## 2022-02-08 MED ORDER — VANCOMYCIN HCL 2000 MG/400ML IV SOLN
2000.0000 mg | Freq: Once | INTRAVENOUS | Status: AC
  Administered 2022-02-08: 2000 mg via INTRAVENOUS
  Filled 2022-02-08: qty 400

## 2022-02-08 MED ORDER — LACTATED RINGERS IV BOLUS (SEPSIS)
1000.0000 mL | Freq: Once | INTRAVENOUS | Status: AC
  Administered 2022-02-08: 1000 mL via INTRAVENOUS

## 2022-02-08 MED ORDER — LORATADINE 10 MG PO TABS
10.0000 mg | ORAL_TABLET | Freq: Every day | ORAL | Status: DC
  Administered 2022-02-08 – 2022-02-18 (×11): 10 mg via ORAL
  Filled 2022-02-08 (×11): qty 1

## 2022-02-08 MED ORDER — METOCLOPRAMIDE HCL 5 MG/ML IJ SOLN
10.0000 mg | Freq: Once | INTRAMUSCULAR | Status: AC
  Administered 2022-02-08: 10 mg via INTRAVENOUS
  Filled 2022-02-08: qty 2

## 2022-02-08 MED ORDER — ENOXAPARIN SODIUM 40 MG/0.4ML IJ SOSY: 40.0000 mg | PREFILLED_SYRINGE | INTRAMUSCULAR | Status: DC

## 2022-02-08 MED ORDER — METOPROLOL TARTRATE 25 MG PO TABS
25.0000 mg | ORAL_TABLET | Freq: Two times a day (BID) | ORAL | Status: DC
  Administered 2022-02-08 – 2022-02-09 (×4): 25 mg via ORAL
  Filled 2022-02-08 (×4): qty 1

## 2022-02-08 MED ORDER — ACETAMINOPHEN 325 MG PO TABS
650.0000 mg | ORAL_TABLET | Freq: Four times a day (QID) | ORAL | Status: DC | PRN
Start: 2022-02-08 — End: 2022-02-18
  Administered 2022-02-10: 650 mg via ORAL
  Filled 2022-02-08: qty 2

## 2022-02-08 MED ORDER — ACETAMINOPHEN 650 MG RE SUPP: 650.0000 mg | Freq: Four times a day (QID) | RECTAL | Status: DC | PRN

## 2022-02-08 MED ORDER — PANTOPRAZOLE SODIUM 40 MG PO TBEC
40.0000 mg | DELAYED_RELEASE_TABLET | Freq: Every day | ORAL | Status: DC
  Administered 2022-02-08 – 2022-02-18 (×11): 40 mg via ORAL
  Filled 2022-02-08 (×11): qty 1

## 2022-02-08 MED ORDER — PIPERACILLIN-TAZOBACTAM 3.375 G IVPB
3.3750 g | Freq: Three times a day (TID) | INTRAVENOUS | Status: DC
  Administered 2022-02-08 – 2022-02-09 (×4): 3.375 g via INTRAVENOUS
  Filled 2022-02-08 (×4): qty 50

## 2022-02-08 MED ORDER — SODIUM CHLORIDE 0.9 % IV SOLN
2.0000 g | INTRAVENOUS | Status: DC
  Administered 2022-02-08: 2 g via INTRAVENOUS
  Filled 2022-02-08: qty 20

## 2022-02-08 NOTE — ED Notes (Signed)
Patient transported to CT 

## 2022-02-08 NOTE — ED Provider Notes (Addendum)
[facility] Provider Note    Event Date/Time   First MD Initiated Contact with Patient 02/08/22 0522     (approximate)   History   Dizziness and Weakness   HPI  Rhonda Thornton is a 71 y.o. female with history of left-sided breast cancer status post lumpectomy and radiation, hypertension, hyperlipidemia, obesity, diabetes who presents to the emergency department with EMS after a fall.  She states over the past days she has felt lightheaded and weak.  She has had chills but no known fever.  Denies chest pain or shortness of breath.  Was found to be in A-fib with RVR with a rate in the 160s to 180s with EMS and denies a history of cardiac disease.  States she uses a cane and walker at baseline.  States she when she fell tonight she was using her walker.  She did hit her head but did not lose consciousness.  She denies being on blood thinners.  She has an abrasion to the right knee.  No neck or back pain.  No numbness, tingling or weakness.  No abdominal pain, vomiting, diarrhea, bloody stools, melena, vaginal bleeding or discharge, dysuria.  No medications given in route.   History provided by patient and EMS.    Past Medical History:  Diagnosis Date   Anemia    Anginal pain (HCC)    Arthritis    Breast cancer (HCC)    2022 [facility]   Chronic pain syndrome    Degenerative disc disease, lumbar    Edema of left lower extremity    Gastroparesis due to secondary diabetes (HCC)    GERD (gastroesophageal reflux disease)    Heart murmur    History of seizure disorder    Hyperlipidemia    Hypertension    Invasive carcinoma of breast (HCC) 04/25/2021   LEFT; stage 1A; grade I invasive mammary carcinoma; ER/PR (+); HER2/neu (-)   Mild aortic stenosis    Mild mitral regurgitation    Mild tricuspid regurgitation    Morbid obesity (HCC)    Opioid use    therapeutic use for Dx of chronic pain syndrome   OSA (obstructive sleep apnea)    Personal history of radiation  therapy    Recurrent falls while walking    uses rolling walker    Requires supplemental oxygen    2L/[place] at bedtime   T2DM (type 2 diabetes mellitus) (HCC)    Tubular adenoma of colon     Past Surgical History:  Procedure Laterality Date   ABDOMINAL HYSTERECTOMY  1991   partial   BREAST BIOPSY Left 0505/2022   u/s bx-"X" clip-INVASIVE MAMMARY CARCINOMA WITH TUBULAR FEATURES   BREAST CYST ASPIRATION Right 07/16/2016   neg   BREAST LUMPECTOMY,RADIO FREQ LOCALIZER,AXILLARY SENTINEL LYMPH NODE BIOPSY Left 05/22/2021   Procedure: BREAST LUMPECTOMY,RADIO FREQ LOCALIZER,AXILLARY SENTINEL LYMPH NODE BIOPSY;  Surgeon: Campbell Lerner, MD;  Location: [facility] ORS;  Service: General;  Laterality: Left;   CHOLECYSTECTOMY     COLONOSCOPY WITH PROPOFOL N/A 07/30/2016   Procedure: COLONOSCOPY WITH PROPOFOL;  Surgeon: Christena Deem, MD;  Location: [facility] ENDOSCOPY;  Service: Endoscopy;  Laterality: N/A;   ESOPHAGOGASTRODUODENOSCOPY (EGD) WITH PROPOFOL N/A 09/09/2017   Procedure: ESOPHAGOGASTRODUODENOSCOPY (EGD) WITH PROPOFOL;  Surgeon: Toledo, Boykin Nearing, MD;  Location: [facility] ENDOSCOPY;  Service: Endoscopy;  Laterality: N/A;   EYE SURGERY Right    for pterygium   FUNCTIONAL ENDOSCOPIC SINUS SURGERY     TONSILLECTOMY      MEDICATIONS:  Prior to Admission medications   Medication Sig Start Date End Date Taking? Authorizing Provider  alum & mag hydroxide-simeth (MAALOX/MYLANTA) 200-200-20 MG/5ML suspension Take 30 mLs by mouth every 6 (six) hours as needed for indigestion or heartburn.    [provider]  amLODipine (NORVASC) 5 MG tablet Take 5 mg by mouth daily.    [provider]  aspirin 325 MG EC tablet Take 325 mg by mouth daily.    [provider]  atorvastatin (LIPITOR) 80 MG tablet Take 80 mg by mouth daily.    [provider]  calcium carbonate (TUMS - DOSED IN MG ELEMENTAL CALCIUM) 500 MG chewable tablet Chew 1,000 mg by mouth 3 (three) times daily as needed  for indigestion or heartburn.    [provider]  Carboxymethylcellulose Sodium (ARTIFICIAL TEARS OP) Place 1 drop into both eyes daily.    [provider]  cetirizine (ZYRTEC) 10 MG tablet Take 10 mg by mouth daily.    [provider]  dorzolamide-timolol (COSOPT) 22.3-6.8 MG/ML ophthalmic solution Place 1 drop into both eyes 2 (two) times daily. 02/01/21   [provider]  fluticasone (FLONASE) 50 MCG/ACT nasal spray Place 2 sprays into both nostrils daily. 09/05/20   [provider]  glipiZIDE (GLUCOTROL XL) 5 MG 24 hr tablet Take 5 mg by mouth daily.    [provider]  HYDROcodone-acetaminophen (NORCO) 7.5-325 MG tablet Take 1 tablet by mouth 2 (two) times daily.    [provider]  ibuprofen (ADVIL,MOTRIN) 200 MG tablet Take 200 mg by mouth every 6 (six) hours as needed for headache or moderate pain.    [provider]  latanoprost (XALATAN) 0.005 % ophthalmic solution Place 1 drop into both eyes at bedtime. 11/05/20   [provider]  letrozole (FEMARA) 2.5 MG tablet Take 1 tablet (2.5 mg total) by mouth daily. 07/24/21   Jeralyn Ruths, MD  lisinopril (PRINIVIL,ZESTRIL) 10 MG tablet Take 10 mg by mouth daily.    [provider]  metFORMIN (GLUCOPHAGE) 500 MG tablet Take 1,000 mg by mouth 2 (two) times daily.    [provider]  Multiple Vitamin (DAILY-VITE MULTIVITAMIN) TABS Take 1 tablet by mouth daily. 01/30/21   [provider]  Multiple Vitamin (MULTIVITAMIN) capsule Take 1 capsule by mouth daily.    [provider]  OXYGEN Inhale 2 L into the lungs at bedtime.    [provider]  pantoprazole (PROTONIX) 40 MG tablet Take 1 tablet (40 mg total) by mouth daily. 08/23/17 08/22/21  Minna Antis, MD  phenylephrine (SUDAFED PE) 10 MG TABS tablet Take 10 mg by mouth at bedtime.    [provider]  pregabalin (LYRICA) 100 MG capsule Take 100 mg by mouth 3  (three) times daily.    [provider]  sertraline (ZOLOFT) 100 MG tablet Take 100 mg by mouth daily.    [provider]  silver sulfADIAZINE (SILVADENE) 1 % cream Apply 1 application topically daily. 07/16/21   Carmina Miller, MD  tolterodine (DETROL LA) 4 MG 24 hr capsule Take 4 mg by mouth daily. 09/05/20   [provider]    Physical Exam   Triage Vital Signs: ED Triage Vitals  Enc Vitals Group     BP      Pulse      Resp      Temp      Temp src      SpO2      Weight  Height      Head Circumference      Peak Flow      Pain Score      Pain Loc      Pain Edu?      Excl. in GC?     Most recent vital signs: Vitals:   02/08/22 0600 02/08/22 0630  BP: (!) 134/104 (!) 143/98  Pulse: (!) 155   Resp: (!) 23 (!) 24  Temp:    SpO2: 93%      CONSTITUTIONAL: Alert and oriented and responds appropriately to questions.  Elderly, obese, appears pale HEAD: Normocephalic; atraumatic EYES: Conjunctivae clear, PERRL, EOMI, no conjunctival pallor ENT: normal nose; no rhinorrhea; moist mucous membranes; pharynx without lesions noted; no dental injury; no septal hematoma, no epistaxis; no facial deformity or bony tenderness NECK: Supple, no midline spinal tenderness, step-off or deformity; trachea midline CARD: Regularly irregular and tachycardic; S1 and S2 appreciated; + murmur, no clicks, no rubs, no gallops RESP: Normal chest excursion without splinting or tachypnea; breath sounds clear and equal bilaterally; no wheezes, no rhonchi, no rales; no hypoxia or respiratory distress CHEST:  chest wall stable, no crepitus or ecchymosis or deformity, nontender to palpation; no flail chest BREAST:  No breast mass palpated on exam.  Left breast is tender to palpation with associated erythema, warmth, induration mostly to the lateral breast without fluctuance.  No nipple discharge. ABD/GI: Normal bowel sounds; non-distended; soft, non-tender, no rebound, no  guarding; no ecchymosis or other lesions noted PELVIS:  stable, nontender to palpation BACK:  The back appears normal; no midline spinal tenderness, step-off or deformity EXT: Normal ROM in all joints; non-tender to palpation; no edema; normal capillary refill; no cyanosis, no bony tenderness or bony deformity of patient's extremities, no joint effusion, compartments are soft, extremities are warm and well-perfused, no ecchymosis, superficial abrasion to the right knee SKIN: Normal color for age and race; warm NEURO: No facial asymmetry, normal speech, moving all extremities equally, reports normal sensation diffusely     Patient gave verbal permission to utilize photo for medical documentation only. The image was not stored on any personal device.   ED Results / Procedures / Treatments   LABS: (all labs ordered are listed, but only abnormal results are displayed) Labs Reviewed  COMPREHENSIVE METABOLIC PANEL - Abnormal; Notable for the following components:      Result Value   Chloride 97 (*)    Glucose, Bld 200 (*)    BUN 25 (*)    All other components within normal limits  CBC WITH DIFFERENTIAL/PLATELET - Abnormal; Notable for the following components:   WBC 13.7 (*)    Hemoglobin 11.0 (*)    HCT 35.0 (*)    Neutro Abs 12.0 (*)    All other components within normal limits  RESP PANEL BY RT-PCR (FLU A&B, COVID) [id number]  CULTURE, BLOOD (ROUTINE X 2)  CULTURE, BLOOD (ROUTINE X 2)  URINE CULTURE  LACTIC ACID, PLASMA  PROTIME-INR  APTT  URINALYSIS, COMPLETE (UACMP) WITH MICROSCOPIC  PROCALCITONIN  TSH  TYPE AND SCREEN  TROPONIN I (HIGH SENSITIVITY)     EKG:  EKG Interpretation  Date/Time:  Saturday February 08 2022 05:37:41 EST Ventricular Rate:  153 PR Interval:    QRS Duration: 87 QT Interval:  287 QTC Calculation: 458 R Axis:   53 Text Interpretation: Atrial fibrillation with rapid V-rate Low voltage, precordial leads Repolarization abnormality, prob rate related  Confirmed by Rochele Raring [phone]) on 02/08/2022 5:51:56 AM  RADIOLOGY: My personal review and interpretation of imaging: X-ray of the right knee shows no fracture or dislocation.  Chest x-ray clear.  CT of the head and cervical spine have been reviewed by myself and show no acute traumatic injury but awaiting radiology read.  CT of the chest shows left breast cellulitis with a large seroma versus abscess.  Awaiting official radiology read.  I have personally reviewed all radiology reports. DG Knee 2 Views Right  Result Date: 02/08/2022 CLINICAL DATA:  71 year old female status post fall with weakness and dizziness. EXAM: RIGHT KNEE - 1-2 VIEW COMPARISON:  None. FINDINGS: Bone mineralization is within normal limits. Tricompartmental joint space loss and degenerative spurring, moderate to severe in both the medial and patellofemoral compartments. On cross-table lateral view no joint effusion identified. Patella intact. No acute osseous abnormality identified. No discrete soft tissue injury. IMPRESSION: No acute fracture or dislocation identified about the right knee. Advanced tricompartmental degeneration. Electronically Signed   By: Genevie Ann M.D.   On: 02/08/2022 06:44   CT HEAD WO CONTRAST (5MM)  Result Date: 02/08/2022 CLINICAL DATA:  70 year old female status post fall with weakness and dizziness. EXAM: CT HEAD WITHOUT CONTRAST TECHNIQUE: Contiguous axial images were obtained from the base of the skull through the vertex without intravenous contrast. RADIATION DOSE REDUCTION: This exam was performed according to the departmental dose-optimization program which includes automated exposure control, adjustment of the mA and/or kV according to patient size and/or use of iterative reconstruction technique. COMPARISON:  Report of head CT 11/24/1997 (no images available). FINDINGS: Brain: Cerebral volume is within normal limits for age. Bulky dural calcifications. No midline shift,  ventriculomegaly, mass effect, evidence of mass lesion, intracranial hemorrhage or evidence of cortically based acute infarction. Gray-white matter differentiation is within normal limits throughout the brain. Vascular: Calcified atherosclerosis at the skull base. No suspicious intracranial vascular hyperdensity. Skull: No acute osseous abnormality identified. Hyperostosis frontalis. Sinuses/Orbits: Evidence of prior ethmoidectomy with asymmetric bilateral ethmoid and frontoethmoidal mucoperiosteal thickening, probable anterior left ethmoid mucocele on series 3, image 19, 17 mm long axis. Mild nasal septal nodularity. Frontal sinuses, sphenoid and maxillary sinuses remain well aerated. Tympanic cavities are clear. Left mastoids are clear. There is trace right mastoid fluid. Other: Negative visible nasopharynx. No orbit or scalp soft tissue injury identified. IMPRESSION: 1. No acute traumatic injury identified. 2. Normal for age non contrast CT appearance of the brain. 3. Chronic appearing ethmoid sinus disease with possible prior ethmoid sinus surgery. 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 4. Trace right mastoid fluid, likely postinflammatory and significance doubtful. Electronically Signed   By: Genevie Ann M.D.   On: 02/08/2022 07:02   DG Chest Port 1 View  Result Date: 02/08/2022 CLINICAL DATA:  71 year old female status post fall with weakness and dizziness. EXAM: PORTABLE CHEST 1 VIEW COMPARISON:  CTA chest 08/23/2017 and earlier. FINDINGS: Portable AP upright view at 0610 hours. Lower lung volumes. Allowing for this cardiac and mediastinal contours remain within normal limits. The patient is mildly rotated to the left. Visualized tracheal air column is within normal limits. Allowing for portable technique the lungs are clear. No pneumothorax or pleural effusion. Paucity of bowel gas in the upper abdomen. No acute osseous abnormality identified. IMPRESSION: Low lung volumes. No acute cardiopulmonary  abnormality or acute traumatic injury identified. Electronically Signed   By: Genevie Ann M.D.   On: 02/08/2022 06:43     PROCEDURES:  Critical Care performed: Yes, see critical care procedure note(s)   CRITICAL CARE Performed  by: Pryor Curia   Total critical care time: 65 minutes  Critical care time was exclusive of separately billable procedures and treating other patients.  Critical care was necessary to treat or prevent imminent or life-threatening deterioration.  Critical care was time spent personally by me on the following activities: development of treatment plan with patient and/or surrogate as well as nursing, discussions with consultants, evaluation of patient's response to treatment, examination of patient, obtaining history from patient or surrogate, ordering and performing treatments and interventions, ordering and review of laboratory studies, ordering and review of radiographic studies, pulse oximetry and re-evaluation of patient's condition.   Marland Kitchen1-3 Lead EKG Interpretation Performed by: Quantavius Humm, Delice Bison, DO Authorized by: Gianelle Mccaul, Delice Bison, DO     Interpretation: abnormal     ECG rate:  145   ECG rate assessment: tachycardic     Rhythm: atrial fibrillation     Ectopy: none     Conduction: normal      IMPRESSION / MDM / ASSESSMENT AND PLAN / ED COURSE  I reviewed the triage vital signs and the nursing notes.  Patient here with a fall with complaints of chills, dizziness and weakness and found to have a left breast cellulitis and fever.  Patient and new onset A-fib with RVR.  The patient is on the cardiac monitor to evaluate for evidence of arrhythmia and/or significant heart rate changes.   DIFFERENTIAL DIAGNOSIS (includes but not limited to):   Sepsis, cellulitis, breast abscess, bacteremia, pneumonia, UTI, ACS, PE, dissection, thyroid dysfunction, dehydration, intracranial hemorrhage, skull fracture, cervical spine fracture   PLAN: We will obtain CBC, CMP,  troponin, urine, cultures, procalcitonin, lactic, COVID and flu swabs, chest x-ray, CT of the head and cervical spine, CT of the chest.  Will give IV fluids, Tylenol, vancomycin and ceftriaxone.  She will need admission.   MEDICATIONS GIVEN IN ED: Medications  lactated ringers infusion (has no administration in time range)  cefTRIAXone (ROCEPHIN) 2 g in sodium chloride 0.9 % 100 mL IVPB (0 g Intravenous Stopped 02/08/22 0636)  vancomycin (VANCOREADY) IVPB 2000 mg/400 mL (2,000 mg Intravenous New Bag/Given 02/08/22 0629)    Followed by  vancomycin (VANCOREADY) IVPB 500 mg/100 mL (has no administration in time range)  lactated ringers bolus 1,250 mL (has no administration in time range)  diltiazem (CARDIZEM) 125 mg in dextrose 5% 125 mL (1 mg/mL) infusion (has no administration in time range)  lactated ringers bolus 1,000 mL (1,000 mLs Intravenous New Bag/Given 02/08/22 0553)  acetaminophen (TYLENOL) tablet 1,000 mg (1,000 mg Oral Given 02/08/22 0559)  lactated ringers bolus 1,000 mL (1,000 mLs Intravenous New Bag/Given 02/08/22 0553)  iohexol (OMNIPAQUE) 300 MG/ML solution 75 mL (75 mLs Intravenous Contrast Given 02/08/22 0640)     ED COURSE:  6:30 AM  Pt's labs show leukocytosis of 13,000 with left shift.  Normal renal function, electrolytes.  Lactic normal at 1.7.  She is still tachycardic in the 150s in atrial fibrillation despite getting about a liter of fluids and having Tylenol.  We will start diltiazem infusion.  Blood pressure has improved.  She is getting 30 mL/kg IV fluid bolus for sepsis.  Chest x-ray and right knee x-ray reviewed by myself and show no acute abnormality.  Radiology read pending.  CT tech coming to get patient now.  7:04 AM  Pt's CT head and cervical spine have been reviewed by myself but awaiting official radiology read.  I do not appreciate any acute traumatic injury.  CT chest shows cellulitis  of the left breast with a large fluid collection that likely represents the  seroma that was seen on breast ultrasound in December that measured approximately 11 cm.  Again awaiting official radiology read.  Unfortunately the entire breast tissue was not included on the CT scan today.  It appears per her previous notes, Dr. Christian Mate was aware of this seroma.  They had discussed risk and benefits of needle aspiration and long-term drain placement but had decided to hold off on intervention and have further discussions on follow-up in April 2023.   CONSULTS:  Consulted and discussed patient's case with hospitalist Dr. Francine Graven.  I have recommended admission and consulting physician agrees and will place admission orders.  Patient (and family if present) agree with this plan.   I reviewed all nursing notes, vitals, pertinent previous records.  All labs, EKGs, imaging ordered have been independently reviewed and interpreted by myself.    OUTSIDE RECORDS REVIEWED: Reviewed patient's most recent radiology oncology note with Dr. Baruch Gouty in 01/23/2022, surgery note with Dr. Christian Mate on 12/12/2021 and oncology note with Dr. Grayland Ormond on 10/29/2021.   Patient had stage Ia ER/PR positive invasive mammary carcinoma status post radiation.  She did have a lumpectomy and had a large multiseptated seroma which was being followed by surgery.  Surgery discussed the possibilities of needle aspiration with patient but discussed that the seroma would likely recur without utilization of a longstanding drain and felt therefore the risks associated were not worthwhile and recommended deferring intervention during last visit in December 2022.  Patient on Femara.  Did not undergo chemotherapy.  Breast US on 12/05/21:  IMPRESSION: Large benign complex fluid collection in the upper-outer quadrant of the left breast most consistent with a seroma. This measures up to 11 cm. Of note the collection may be difficult to drain percutaneously given multiple thick septations and complex fluid.      FINAL  CLINICAL IMPRESSION(S) / ED DIAGNOSES   Final diagnoses:  Acute sepsis ([place])  Cellulitis of left breast  Atrial fibrillation with RVR ([place])     Rx / DC Orders   ED Discharge Orders     None        Note:  This document was prepared using Dragon voice recognition software and may include unintentional dictation errors.   Mia Winthrop, Delice Bison, DO 02/08/22 0704    Sumayyah Custodio, Delice Bison, DO 02/08/22 [phone]

## 2022-02-08 NOTE — ED Notes (Signed)
Rn to bedside. Cardiazem bag emtpy. Pump was programmed at 5mg /kg/hour...pump malfunctioned and administered over shorter time frame. MD made aware. Will monitor Pt. Will hold drip until further.

## 2022-02-08 NOTE — ED Notes (Signed)
Pt complaint of feeling nauseated.

## 2022-02-08 NOTE — ED Triage Notes (Signed)
Pt c/o fall tonight. Pt reports that she has been "weak & wobbly". Pt reports she was achy and dizzy yesterday. EMS reports when they picked pt up she was in A-fib with HR 160-180.

## 2022-02-08 NOTE — Sepsis Progress Note (Signed)
Following per sepsis protocol   

## 2022-02-08 NOTE — Consult Note (Signed)
ANTICOAGULATION CONSULT NOTE  Pharmacy Consult for Lovenox Indication: atrial fibrillation  Patient Measurements: Height: 5\' 4"  (162.6 cm) Weight: 108.4 kg (239 lb) IBW/kg (Calculated) : 54.7  Labs: Recent Labs    02/08/22 0542  HGB 11.0*  HCT 35.0*  PLT 173  APTT 31  LABPROT 14.7  INR 1.2  CREATININE 0.81  TROPONINIHS 20*    Estimated Creatinine Clearance: 76.6 mL/min (by C-G formula based on SCr of 0.81 mg/dL).   Medical History: Past Medical History:  Diagnosis Date   Anemia    Anginal pain (HCC)    Arthritis    Breast cancer ([place])    2022 [facility]   Chronic pain syndrome    Degenerative disc disease, lumbar    Edema of left lower extremity    Gastroparesis due to secondary diabetes (HCC)    GERD (gastroesophageal reflux disease)    Heart murmur    History of seizure disorder    Hyperlipidemia    Hypertension    Invasive carcinoma of breast ([place]) 04/25/2021   LEFT; stage 1A; grade I invasive mammary carcinoma; ER/PR (+); HER2/neu (-)   Mild aortic stenosis    Mild mitral regurgitation    Mild tricuspid regurgitation    Morbid obesity (HCC)    Opioid use    therapeutic use for Dx of chronic pain syndrome   OSA (obstructive sleep apnea)    Personal history of radiation therapy    Recurrent falls while walking    uses rolling walker    Requires supplemental oxygen    2L/[place] at bedtime   T2DM (type 2 diabetes mellitus) ([place])    Tubular adenoma of colon     Medications:  No anticoagulation prior to admission per my chart review  Assessment: Patient is a 71 y/o F with medical history as above and including morbid obesity (BMI 41) who is admitted with new onset Afib with RVR and sepsis from left breast cellulitis. Pharmacy consulted to dose Lovenox for Afib.  Goal of Therapy:  Anti-Xa level 0.6-1 units/ml 4hrs after LMWH dose given Monitor platelets by anticoagulation protocol: Yes   Plan:  --Lovenox 108 mg (1 mg/kg) q12h; may consider levels at steady  state if therapy continued given body habitus --CBC daily per protocol --Follow-up transition to oral anticoagulation if plan for long-term anticoagulation  Benita Gutter 02/08/2022,10:12 AM

## 2022-02-08 NOTE — H&P (Signed)
History and Physical    Patient: Rhonda Thornton DOB: 24-Feb-1951 DOA: 02/08/2022 DOS: the patient was seen and examined on 02/08/2022 PCP: Denton Lank, MD  Patient coming from: Home  Chief Complaint:  Chief Complaint  Patient presents with   Dizziness   Weakness    HPI: Rhonda Thornton is a 71 y.o. 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 Patient states that she has not felt well for the last 3 days and has been weak, fatigued, has had myalgias as well as fever and chills. She admits to hitting her head when she fell but denies any loss of consciousness.  She denied having any neck or back pain. She called EMS and when they arrived she was found to be in A-fib with a rapid ventricular rate with heart rates between 160 and 180. She complains of feeling very sore in her left breast and has had nausea but denies having any cough, no vomiting, no abdominal pain, no changes in her bowel habits, no urinary symptoms, no headache, no blurred vision, no focal deficit. Upon arrival to the ER she was tachycardic with heart rate of 155, tachypneic and had a Tmax of 102.2. She received sepsis fluid bolus  as well as a dose of IV vancomycin. Review of Systems: As mentioned in the history of present illness. All other systems reviewed and are negative. Past Medical History:  Diagnosis Date   Anemia    Anginal pain ([place])    Arthritis    Breast cancer ([place])    2022 [facility]   Chronic pain syndrome    Degenerative disc disease, lumbar    Edema of left lower extremity    Gastroparesis due to secondary diabetes (HCC)    GERD (gastroesophageal reflux disease)    Heart murmur    History of seizure  disorder    Hyperlipidemia    Hypertension    Invasive carcinoma of breast ([place]) 04/25/2021   LEFT; stage 1A; grade I invasive mammary carcinoma; ER/PR (+); HER2/neu (-)   Mild aortic stenosis    Mild mitral regurgitation    Mild tricuspid regurgitation    Morbid obesity (HCC)    Opioid use    therapeutic use for Dx of chronic pain syndrome   OSA (obstructive sleep apnea)    Personal history of radiation therapy    Recurrent falls while walking    uses rolling walker    Requires supplemental oxygen    2L/[place] at bedtime   T2DM (type 2 diabetes mellitus) ([place])    Tubular adenoma of colon    Past Surgical History:  Procedure Laterality Date   ABDOMINAL HYSTERECTOMY  1991   partial   BREAST BIOPSY Left 0505/2022   u/s bx-"X" clip-INVASIVE MAMMARY CARCINOMA WITH TUBULAR FEATURES   BREAST CYST ASPIRATION Right 07/16/2016   neg   BREAST LUMPECTOMY,RADIO FREQ LOCALIZER,AXILLARY SENTINEL LYMPH NODE BIOPSY Left 05/22/2021   Procedure: BREAST LUMPECTOMY,RADIO FREQ LOCALIZER,AXILLARY SENTINEL LYMPH NODE BIOPSY;  Surgeon: Ronny Bacon, MD;  Location: [facility] ORS;  Service: General;  Laterality: Left;   CHOLECYSTECTOMY     COLONOSCOPY WITH PROPOFOL N/A 07/30/2016   Procedure: COLONOSCOPY WITH PROPOFOL;  Surgeon: Lollie Sails, MD;  Location: [facility] ENDOSCOPY;  Service: Endoscopy;  Laterality: N/A;   ESOPHAGOGASTRODUODENOSCOPY (EGD) WITH PROPOFOL N/A 09/09/2017   Procedure: ESOPHAGOGASTRODUODENOSCOPY (EGD) WITH PROPOFOL;  Surgeon: Tol[place], Benay Pike, MD;  Location: [facility] ENDOSCOPY;  Service: Endoscopy;  Laterality: N/A;   EYE SURGERY Right    for pterygium   FUNCTIONAL ENDOSCOPIC SINUS SURGERY     TONSILLECTOMY     Social History:  reports that she has never smoked. She has never used smokeless tobacco. She reports that she does not drink alcohol and does not use drugs.  No Known Allergies  Family History  Problem Relation Age of Onset   Stomach cancer Mother    Colon cancer Brother     Breast cancer Neg Hx     Prior to Admission medications   Medication Sig Start Date End Date Taking? Authorizing Provider  amLODipine (NORVASC) 5 MG tablet Take 5 mg by mouth daily.   Yes [provider]  aspirin 325 MG EC tablet Take 325 mg by mouth daily.   Yes [provider]  atorvastatin (LIPITOR) 80 MG tablet Take 80 mg by mouth daily.   Yes [provider]  calcium carbonate (TUMS - DOSED IN MG ELEMENTAL CALCIUM) 500 MG chewable tablet Chew 1,000 mg by mouth 3 (three) times daily as needed for indigestion or heartburn.   Yes [provider]  Carboxymethylcellulose Sodium (ARTIFICIAL TEARS OP) Place 1 drop into both eyes daily.   Yes [provider]  cetirizine (ZYRTEC) 10 MG tablet Take 10 mg by mouth daily.   Yes [provider]  dorzolamide-timolol (COSOPT) 22.3-6.8 MG/ML ophthalmic solution Place 1 drop into both eyes 2 (two) times daily. 02/01/21  Yes [provider]  fluticasone (FLONASE) 50 MCG/ACT nasal spray Place 2 sprays into both nostrils daily. 09/05/20  Yes [provider]  glipiZIDE (GLUCOTROL XL) 5 MG 24 hr tablet Take 5 mg by mouth daily.   Yes [provider]  HYDROcodone-acetaminophen (NORCO) 7.5-325 MG tablet Take 1 tablet by mouth 2 (two) times daily.   Yes [provider]  latanoprost (XALATAN) 0.005 % ophthalmic solution Place 1 drop into both eyes at bedtime. 11/05/20  Yes [provider]  letrozole (FEMARA) 2.5 MG tablet Take 1 tablet (2.5 mg total) by mouth daily. 07/24/21  Yes Lloyd Huger, MD  lisinopril (PRINIVIL,ZESTRIL) 10 MG tablet Take 10 mg by mouth daily.   Yes [provider]  metFORMIN (GLUCOPHAGE) 500 MG tablet Take 1,000 mg by mouth 2 (two) times daily.   Yes [provider]  Multiple Vitamin (DAILY-VITE MULTIVITAMIN) TABS Take 1 tablet by mouth daily. 01/30/21  Yes [provider]  naproxen (NAPROSYN) 500 MG tablet Take 500 mg  by mouth 2 (two) times daily. 01/28/22  Yes [provider]  OXYGEN Inhale 2 L into the lungs at bedtime.   Yes [provider]  phenylephrine (SUDAFED PE) 10 MG TABS tablet Take 10 mg by mouth at bedtime.   Yes [provider]  pregabalin (LYRICA) 100 MG capsule Take 100 mg by mouth 3 (three) times daily.   Yes [provider]  sertraline (ZOLOFT) 100 MG tablet Take 100 mg by mouth daily.   Yes [provider]  tolterodine (DETROL LA) 4 MG 24 hr capsule Take 4 mg by mouth daily. 09/05/20  Yes [provider]  alum & mag hydroxide-simeth (MAALOX/MYLANTA) 200-200-20 MG/5ML suspension Take 30 mLs by mouth every 6 (six) hours as needed for indigestion or heartburn.    [provider]  ibuprofen (ADVIL,MOTRIN)  200 MG tablet Take 200 mg by mouth every 6 (six) hours as needed for headache or moderate pain. Patient not taking: Reported on 02/08/2022    [provider]  Multiple Vitamin (MULTIVITAMIN) capsule Take 1 capsule by mouth daily. Patient not taking: Reported on 02/08/2022    [provider]  pantoprazole (PROTONIX) 40 MG tablet Take 1 tablet (40 mg total) by mouth daily. 08/23/17 08/22/21  Harvest Dark, MD  silver sulfADIAZINE (SILVADENE) 1 % cream Apply 1 application topically daily. Patient not taking: Reported on 02/08/2022 07/16/21   [name], MD  sodium chloride (OCEAN) 0.65 % SOLN nasal spray Place 1 spray into both nostrils as needed for congestion.    [provider]    Physical Exam: Vitals:   02/08/22 0700 02/08/22 0746 02/08/22 0845 02/08/22 0900  BP: 113/75  118/73 117/79  Pulse: (!) 131  (!) 139 (!) 119  Resp: (!) 23  (!) 21 (!) 25  Temp:  99.2 F (37.3 C) 99.2 F (37.3 C)   TempSrc:  Oral    SpO2: 93%  92% 93%  Weight:      Height:       Physical Exam Vitals and nursing note reviewed.  Constitutional:      Appearance: She is obese.  HENT:     Head: Normocephalic and  atraumatic.     Nose: Nose normal.     Mouth/Throat:     Mouth: Mucous membranes are moist.  Eyes:     Pupils: Pupils are equal, round, and reactive to light.  Cardiovascular:     Rate and Rhythm: Tachycardia present. Rhythm irregular.  Pulmonary:     Effort: Pulmonary effort is normal.     Breath sounds: Normal breath sounds.  Abdominal:     General: Abdomen is flat. Bowel sounds are normal.     Palpations: Abdomen is soft.  Musculoskeletal:        General: Normal range of motion.     Cervical back: Normal range of motion and neck supple.  Skin:    General: Skin is warm and dry.     Findings: Erythema present.     Comments: Redness, swelling and differential warmth involving the left breast  Neurological:     General: No focal deficit present.     Mental Status: She is alert and oriented to person, place, and time.  Psychiatric:        Mood and Affect: Mood normal.        Behavior: Behavior normal.     Data Reviewed: Notes from primary care and specialist visits, past discharge summaries. Prior diagnostic testing as applicable to current admission diagnoses Updated medications and problem lists for reconciliation ED course, including vitals, labs, imaging, treatment and response to treatment Triage notes and ED providers notes Labs reviewed show white count of 13.7, hemoglobin of 11.0, procalcitonin 0.46 Chest x-ray reviewed by me shows no evidence of acute cardiopulmonary disease Right knee x-ray shows no acute fracture or dislocation identified about the right knee.  Advanced tricompartmental degeneration. Chest CT shows partially visible swollen left breast with large, roughly 11 cm diameter encapsulated fluid collection.  This may be a postoperative seroma.  Extensive surrounding chest wall inflammation with similar inflammatory changes in the anterior left lung is likely sequelae of radiation therapy.  No definite thoracic metastatic disease. Twelve-lead EKG reviewed by  me shows rapid atrial fibrillation.  There are no new results to review at this time.  Assessment and Plan: Principal  Problem:   Sepsis ([place]) Active Problems:   Carcinoma of upper-outer quadrant of female breast, left ([place])   Type 2 diabetes mellitus without complications ([place])   Essential (primary) hypertension   Sleep apnea   Morbid obesity ([place])   Cellulitis of left breast   Atrial fibrillation with RVR (HCC)   Sepsis from left breast cellulitis As evidenced by fever with a Tmax of 102.2, tachycardia, tachypnea, leukocytosis and left breast cellulitis Patient has a known large seroma in her left breast which appears infected.  Imaging does not show any evidence of an abscess at this time. Continue IV fluid resuscitation Place patient on empiric antibiotic therapy with vancomycin, cefepime and Flagyl Follow-up results of blood cultures      Left breast cancer Patient has a known history of stage Ia invasive mammary carcinoma and is status post lumpectomy with whole breast radiation of her left breast. Follow-up with radiation oncology as well as surgery as an outpatient Continue Femara    Atrial fibrillation with rapid ventricular rate Appears to be new onset Patient has a CHA2DS2-VASc score of 4 and ideally requires long-term anticoagulation as primary prophylaxis for an acute stroke Obtain 2D echocardiogram to assess LVEF and rule out valvular pathology Continue Cardizem IV for rate control Consult cardiology     Diabetes mellitus Maintain consistent carbohydrate diet Glycemic control with sliding scale insulin    Depression Continue Zoloft    Morbid obesity (BMI 41) Complicates overall prognosis and care     Chronic respiratory failure Continue oxygen supplementation at 2 L    Advance Care Planning:   Code Status: Full Code   Consults: Cardiology  Family Communication: Greater than 50% of time was spent discussing patient's condition and  plan of care with her at the bedside.  All questions and concerns have been addressed.  She verbalizes understanding and agrees with the plan.  Severity of Illness: The appropriate patient status for this patient is INPATIENT. Inpatient status is judged to be reasonable and necessary in order to provide the required intensity of service to ensure the patient's safety. The patient's presenting symptoms, physical exam findings, and initial radiographic and laboratory data in the context of their chronic comorbidities is felt to place them at high risk for further clinical deterioration. Furthermore, it is not anticipated that the patient will be medically stable for discharge from the hospital within 2 midnights of admission.   * I certify that at the point of admission it is my clinical judgment that the patient will require inpatient hospital care spanning beyond 2 midnights from the point of admission due to high intensity of service, high risk for further deterioration and high frequency of surveillance required.*  Author: Collier Bullock, MD 02/08/2022 9:49 AM  For on call review [email or web address].

## 2022-02-08 NOTE — Progress Notes (Signed)
CODE SEPSIS - PHARMACY COMMUNICATION  **Broad Spectrum Antibiotics should be administered within 1 hour of Sepsis diagnosis**  Time Code Sepsis Called/Page Received: 3500  Antibiotics Ordered: Ceftriaxone + vancomycin  Time of 1st antibiotic administration: 0554  Additional action taken by pharmacy: N/A  Benita Gutter 02/08/2022  7:55 AM

## 2022-02-08 NOTE — Progress Notes (Signed)
Patient came up from ED HR in 140-150's notified MD and because pt was given 125mg  of cardizem down in ED as well as lopressor at 1215 she wants to wait to do anything for her HR until possibly the 4 hour mark when new orders may be given. Also patient came up and was placed on 3L [place] O2 sats in 88-90 on RA. Will continue to monitor.

## 2022-02-08 NOTE — ED Notes (Addendum)
Rn to bedside to introduce self to pt and initiate pt care. This RN was informed there was 2 bags of LR hanging. There was one bag of LR and one bag of NS .9% hanging along with VANC. This RN immediately DC the NS as it is not compatible with LR. MD made aware.

## 2022-02-08 NOTE — ED Notes (Signed)
Crystal RN aware of assigned bed

## 2022-02-08 NOTE — Progress Notes (Signed)
Patient converted to NSR with a rate of 70-80. Md notified, ekg placed in patients chart. Will continue to monitor.

## 2022-02-08 NOTE — Progress Notes (Signed)
PHARMACY -  BRIEF ANTIBIOTIC NOTE   Pharmacy has received consult(s) for Vancomycin from an ED provider.  The patient's profile has been reviewed for ht/wt/allergies/indication/available labs.    One time order(s) placed for   Vancomycin 2500 mg IV X 1   Further antibiotics/pharmacy consults should be ordered by admitting physician if indicated.                       Thank you, Jannie Doyle D 02/08/2022  5:42 AM

## 2022-02-08 NOTE — Plan of Care (Signed)
Patient admitted to ICU, Aox4, HR in the 150's notified MD, no new orders at this time, Patient complaining of nausea, gave prn antiemetic, will continue to monitor Problem: Education: Goal: Knowledge of General Education information will improve Description: Including pain rating scale, medication(s)/side effects and non-pharmacologic comfort measures Outcome: Progressing   Problem: Health Behavior/Discharge Planning: Goal: Ability to manage health-related needs will improve Outcome: Progressing   Problem: Clinical Measurements: Goal: Ability to maintain clinical measurements within normal limits will improve Outcome: Progressing Goal: Will remain free from infection Outcome: Progressing Goal: Diagnostic test results will improve Outcome: Progressing Goal: Respiratory complications will improve Outcome: Progressing Goal: Cardiovascular complication will be avoided Outcome: Progressing   Problem: Activity: Goal: Risk for activity intolerance will decrease Outcome: Progressing   Problem: Nutrition: Goal: Adequate nutrition will be maintained Outcome: Progressing   Problem: Coping: Goal: Level of anxiety will decrease Outcome: Progressing   Problem: Elimination: Goal: Will not experience complications related to bowel motility Outcome: Progressing Goal: Will not experience complications related to urinary retention Outcome: Progressing   Problem: Pain Managment: Goal: General experience of comfort will improve Outcome: Progressing   Problem: Safety: Goal: Ability to remain free from injury will improve Outcome: Progressing   Problem: Skin Integrity: Goal: Risk for impaired skin integrity will decrease Outcome: Progressing

## 2022-02-08 NOTE — Consult Note (Signed)
Pharmacy Antibiotic Note  Rhonda Thornton is a 71 y.o. female admitted on 02/08/2022 with  new on set Afib with RVR in setting of sepsis secondary to left breast cellulitis / large known seroma in left breast  .  Pharmacy has been consulted for vancomycin and cefepime dosing. Patient is also ordered metronidazole.   Plan:  Cefepime 2 g IV q8h --Patient has apparent history of seizure disorder though does not appear to be taking any AEDs at this point --Monitor for neurotoxicity / seizure activity while on cefepime  Vancomycin 2.5 g IV LD followed by maintenance regimen of vancomycin 1.25 g IV q24h --Calculcated AUC: 460, Cmin 10.7 --Daily Scr per protocol --Levels at steady state as clinically indicated  Height: 5\' 4"  (162.6 cm) Weight: 108.4 kg (239 lb) IBW/kg (Calculated) : 54.7  Temp (24hrs), Avg:100.2 F (37.9 C), Min:99.2 F (37.3 C), Max:102.2 F (39 C)  Recent Labs  Lab 02/08/22 0542  WBC 13.7*  CREATININE 0.81  LATICACIDVEN 1.7    Estimated Creatinine Clearance: 76.6 mL/min (by C-G formula based on SCr of 0.81 mg/dL).    No Known Allergies  Antimicrobials this admission: Ceftriaxone 2/18 x 1 Vancomycin 2/18 >>  Cefepime 2/18 >>  Metronidazole 2/18 >>   Dose adjustments this admission: N/A  Microbiology results: 2/18 BCx: pending 2/18 UCx: pending  2/18 MRSA PCR: pending  Thank you for allowing pharmacy to be a part of this patients care.  Benita Gutter 02/08/2022 10:16 AM

## 2022-02-09 ENCOUNTER — Inpatient Hospital Stay
Admit: 2022-02-09 | Discharge: 2022-02-09 | Disposition: A | Discharge status: Home / Self Care | Payer: Medicare HMO | Attending: Internal Medicine | Admitting: Internal Medicine

## 2022-02-09 DIAGNOSIS — A419 Sepsis, unspecified organism: Secondary | ICD-10-CM | POA: Diagnosis not present

## 2022-02-09 LAB — GLUCOSE, CAPILLARY
Glucose-Capillary: 148 mg/dL — ABNORMAL HIGH (ref 70–99)
Glucose-Capillary: 170 mg/dL — ABNORMAL HIGH (ref 70–99)
Glucose-Capillary: 165 mg/dL — ABNORMAL HIGH (ref 70–99)
Glucose-Capillary: 180 mg/dL — ABNORMAL HIGH (ref 70–99)

## 2022-02-09 LAB — CBC
HCT: 28.6 % — ABNORMAL LOW (ref 36.0–46.0)
Hemoglobin: 8.8 g/dL — ABNORMAL LOW (ref 12.0–15.0)
MCH: 27.6 pg (ref 26.0–34.0)
MCHC: 30.8 g/dL (ref 30.0–36.0)
MCV: 89.7 fL (ref 80.0–100.0)
Platelets: 158 10*3/uL (ref 150–400)
RBC: 3.19 MIL/uL — ABNORMAL LOW (ref 3.87–5.11)
RDW: 15.3 % (ref 11.5–15.5)
WBC: 8.9 10*3/uL (ref 4.0–10.5)
nRBC: 0 % (ref 0.0–0.2)

## 2022-02-09 LAB — BLOOD CULTURE ID PANEL (REFLEXED) - BCID2

## 2022-02-09 LAB — BASIC METABOLIC PANEL
Anion gap: 7 (ref 5–15)
BUN: 20 mg/dL (ref 8–23)
CO2: 27 mmol/L (ref 22–32)
Calcium: 8.4 mg/dL — ABNORMAL LOW (ref 8.9–10.3)
Chloride: 99 mmol/L (ref 98–111)
Creatinine, Ser: 0.59 mg/dL (ref 0.44–1.00)
GFR, Estimated: >60 mL/min (ref >60)
Glucose, Bld: 175 mg/dL — ABNORMAL HIGH (ref 70–99)
Potassium: 3.5 mmol/L (ref 3.5–5.1)
Sodium: 133 mmol/L — ABNORMAL LOW (ref 135–145)

## 2022-02-09 LAB — URINE CULTURE: Culture: NO GROWTH

## 2022-02-09 LAB — PROTIME-INR
INR: 1.2 (ref 0.8–1.2)
Prothrombin Time: 15 seconds (ref 11.4–15.2)

## 2022-02-09 LAB — PROCALCITONIN: Procalcitonin: 0.3 ng/mL

## 2022-02-09 LAB — CORTISOL-AM, BLOOD: Cortisol - AM: 10.3 ug/dL (ref 6.7–22.6)

## 2022-02-09 MED ORDER — HYDROCODONE-ACETAMINOPHEN 7.5-325 MG PO TABS
1.0000 | ORAL_TABLET | Freq: Four times a day (QID) | ORAL | Status: DC | PRN
  Administered 2022-02-10: 2 via ORAL
  Administered 2022-02-10: 1 via ORAL
  Filled 2022-02-09: qty 1
  Filled 2022-02-09: qty 2

## 2022-02-09 MED ORDER — CHLORHEXIDINE GLUCONATE CLOTH 2 % EX PADS
6.0000 | MEDICATED_PAD | Freq: Every day | CUTANEOUS | Status: DC
  Administered 2022-02-09 – 2022-02-16 (×7): 6 via TOPICAL

## 2022-02-09 MED ORDER — HYDROCODONE-ACETAMINOPHEN 7.5-325 MG PO TABS
1.0000 | ORAL_TABLET | Freq: Two times a day (BID) | ORAL | Status: DC | PRN
  Administered 2022-02-09: 1 via ORAL
  Filled 2022-02-09: qty 1

## 2022-02-09 MED ORDER — APIXABAN 5 MG PO TABS
5.0000 mg | ORAL_TABLET | Freq: Two times a day (BID) | ORAL | Status: DC
Start: 2022-02-09 — End: 2022-02-18
  Administered 2022-02-09 – 2022-02-18 (×18): 5 mg via ORAL
  Filled 2022-02-09 (×18): qty 1

## 2022-02-09 MED ORDER — ASPIRIN EC 81 MG PO TBEC
81.0000 mg | DELAYED_RELEASE_TABLET | Freq: Every day | ORAL | Status: DC
  Administered 2022-02-10 – 2022-02-15 (×6): 81 mg via ORAL
  Filled 2022-02-09 (×6): qty 1

## 2022-02-09 MED ORDER — SALINE SPRAY 0.65 % NA SOLN
1.0000 | NASAL | Status: DC | PRN
  Administered 2022-02-09 – 2022-02-16 (×7): 1 via NASAL
  Filled 2022-02-09: qty 44

## 2022-02-09 MED ORDER — SODIUM CHLORIDE 0.9 % IV SOLN
2.0000 g | INTRAVENOUS | Status: DC
  Administered 2022-02-09 – 2022-02-10 (×2): 2 g via INTRAVENOUS
  Filled 2022-02-09: qty 2
  Filled 2022-02-09 (×2): qty 20

## 2022-02-09 NOTE — Progress Notes (Signed)
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 Patient states that she has not felt well for the last 3 days and has been weak, fatigued, has had myalgias as well as fever and chills. She admits to hitting her head when she fell but denies any loss of consciousness.  She denied having any neck or back pain. She called EMS and when they arrived she was found to be in A-fib with a rapid ventricular rate with heart rates between 160 and 180. She complains of feeling very sore in her left breast and has had nausea.   Sepsis from left breast cellulitis As evidenced by fever with a Tmax of 102.2, tachycardia, tachypnea, leukocytosis and left breast cellulitis --Patient has a known large seroma in her left breast.  Imaging did not show any evidence of an abscess at this time. --started on vanc and ceftriaxone and then broadened to zosyn Plan: --cont abx as vanc and ceftriaxone --will reach out to Rhonda Thornton tomorrow   stage Ia invasive mammary carcinoma status post lumpectomy and whole breast radiation of her left breast. Known large seroma Continue home Femara --will reach out to Rhonda Thornton tomorrow   Atrial  fibrillation with rapid ventricular rate --no prior hx, likely triggered by sepsis --was started on lovenox for stroke ppx --rate controlled and converted after just 1 dose of IV metop --cardiology consulted, with Dr. Nehemiah Thornton Plan: --cont Lopressor 25 mg BID --transition to Eliquis today --per cardiology, Continue anticoagulation for further risk reduction and stroke with atrial fibrillation for at least 1 month and then reevaluate significance of recurrence --Echo   Diabetes mellitus, well controlled --SSI for now   Depression Continue Zoloft   Morbid obesity (BMI 41) Complicates overall prognosis and care  Chronic respiratory failure on 2L nightly --Continue supplemental O2 to keep sats >=92%  Chronic pain on chronic opioids --increase home Norco due to increased pain with left breast    DVT prophylaxis: VE:HMCNOBS Code Status: Full code  Family Communication: family updated at bedside today Level of care: Med-Surg Dispo:   The patient is from: home Anticipated d/c is to: home Anticipated d/c date is: 2-3 days Patient currently is not medically ready to d/c due to: sepsis 2/2 cellulitis with possible abscess on IV abx   Subjective and Interval History:  Pt reported feeling overall better.  Left breast felt heavy.   Objective: Vitals:   02/09/22 0400 02/09/22 0800 02/09/22 1301 02/09/22 1625  BP: 111/64 116/70 136/65 (!) 116/58  Pulse: 65 72 87 92  Resp: 17 16 18 17   Temp: 98.7 F (37.1 C) 98.6 F (37 C) 98.5 F (36.9 C) 98.1 F (36.7 C)  TempSrc: Oral Oral  Oral  SpO2:  96% 94% 92% 96%  Weight:      Height:        Intake/Output Summary (Last 24 hours) at 02/09/2022 1757 Last data filed at 02/09/2022 1631 Gross per 24 hour  Intake 195.03 ml  Output 850 ml  Net -654.97 ml   Filed Weights   02/08/22 0536 02/08/22 1441  Weight: 108.4 kg 108.4 kg    Examination:   Constitutional: NAD, AAOx3 HEENT: conjunctivae and lids normal, EOMI CV: No  cyanosis.   RESP: normal respiratory effort Extremities: No effusions, edema in BLE SKIN: warm, dry, left breast lateral area with erythema, firmness and warmer than surrounding area Neuro: II - XII grossly intact.   Psych: Normal mood and affect.  Appropriate judgement and reason   Data Reviewed: I have personally reviewed following labs and imaging studies  CBC: Recent Labs  Lab 02/08/22 0542 02/09/22 0440  WBC 13.7* 8.9  NEUTROABS 12.0*  --   HGB 11.0* 8.8*  HCT 35.0* 28.6*  MCV 88.8 89.7  PLT 173 235   Basic Metabolic Panel: Recent Labs  Lab 02/08/22 0542 02/08/22 0935 02/09/22 0440  NA 136  --  133*  K 3.5  --  3.5  CL 97*  --  99  CO2 28  --  27  GLUCOSE 200*  --  175*  BUN 25*  --  20  CREATININE 0.81  --  0.59  CALCIUM 9.5  --  8.4*  MG  --  1.8  --    GFR: Estimated Creatinine Clearance: 77.6 mL/min (by C-G formula based on SCr of 0.59 mg/dL). Liver Function Tests: Recent Labs  Lab 02/08/22 0542  AST 18  ALT 14  ALKPHOS 63  BILITOT 1.1  PROT 7.7  ALBUMIN 3.8   No results for input(s): LIPASE, AMYLASE in the last 168 hours. No results for input(s): AMMONIA in the last 168 hours. Coagulation Profile: Recent Labs  Lab 02/08/22 0542 02/09/22 0440  INR 1.2 1.2   Cardiac Enzymes: No results for input(s): CKTOTAL, CKMB, CKMBINDEX, TROPONINI in the last 168 hours. BNP (last 3 results) No results for input(s): PROBNP in the last 8760 hours. HbA1C: Recent Labs    02/08/22 1444  HGBA1C 6.1*   CBG: Recent Labs  Lab 02/08/22 1647 02/08/22 2132 02/09/22 0715 02/09/22 1140 02/09/22 1624  GLUCAP 175* 209* 148* 170* 165*   Lipid Profile: No results for input(s): CHOL, HDL, LDLCALC, TRIG, CHOLHDL, LDLDIRECT in the last 72 hours. Thyroid Function Tests: Recent Labs    02/08/22 0542  TSH 0.744   Anemia Panel: No results for input(s): VITAMINB12, FOLATE, FERRITIN, TIBC, IRON, RETICCTPCT in the last 72 hours. Sepsis Labs: Recent Labs  Lab  02/08/22 0542 02/09/22 0440  PROCALCITON 0.46 0.30  LATICACIDVEN 1.7  --     Recent Results (from the past 240 hour(s))  Resp Panel by RT-PCR (Flu A&B, Covid) Nasopharyngeal Swab     Status: None   Collection Time: 02/08/22  5:42 AM   Specimen: Nasopharyngeal Swab; Nasopharyngeal(NP) swabs in vial transport medium  Result Value Ref Range Status   SARS Coronavirus 2 by RT PCR NEGATIVE NEGATIVE Final    Comment: (NOTE) SARS-CoV-2 target nucleic acids are NOT DETECTED.  The SARS-CoV-2 RNA is generally detectable in upper respiratory specimens during the acute phase of infection. The lowest concentration of SARS-CoV-2 viral copies this assay can detect is 138 copies/mL. A negative result does not preclude SARS-Cov-2 infection and should not be used as the sole basis for  treatment or other patient management decisions. A negative result may occur with  improper specimen collection/handling, submission of specimen other than nasopharyngeal swab, presence of viral mutation(s) within the areas targeted by this assay, and inadequate number of viral copies(<138 copies/mL). A negative result must be combined with clinical observations, patient history, and epidemiological information. The expected result is Negative.  Fact Sheet for Patients:  EntrepreneurPulse.com.au  Fact Sheet for Healthcare Providers:  IncredibleEmployment.be  This test is no t yet approved or cleared by the [place] FDA and  has been authorized for detection and/or diagnosis of SARS-CoV-2 by FDA under an Emergency Use Authorization (EUA). This EUA will remain  in effect (meaning this test can be used) for the duration of the COVID-19 declaration under Section 564(b)(1) of the Act, 21 U.S.C.section 360bbb-3(b)(1), unless the authorization is terminated  or revoked sooner.       Influenza A by PCR NEGATIVE NEGATIVE Final   Influenza B by PCR NEGATIVE NEGATIVE Final     Comment: (NOTE) The Xpert Xpress SARS-CoV-2/FLU/RSV plus assay is intended as an aid in the diagnosis of influenza from Nasopharyngeal swab specimens and should not be used as a sole basis for treatment. Nasal washings and aspirates are unacceptable for Xpert Xpress SARS-CoV-2/FLU/RSV testing.  Fact Sheet for Patients: EntrepreneurPulse.com.au  Fact Sheet for Healthcare Providers: IncredibleEmployment.be  This test is not yet approved or cleared by the [place] FDA and has been authorized for detection and/or diagnosis of SARS-CoV-2 by FDA under an Emergency Use Authorization (EUA). This EUA will remain in effect (meaning this test can be used) for the duration of the COVID-19 declaration under Section 564(b)(1) of the Act, 21 U.S.C. section 360bbb-3(b)(1), unless the authorization is terminated or revoked.  Performed at [facility], [address] [place], [place] [address]   Blood Culture (routine x 2)     Status: None (Preliminary result)   Collection Time: 02/08/22  5:42 AM   Specimen: BLOOD RIGHT FOREARM  Result Value Ref Range Status   Specimen Description BLOOD RIGHT FOREARM  Final   Special Requests   Final    BOTTLES DRAWN AEROBIC AND ANAEROBIC Blood Culture adequate volume   Culture   Final    NO GROWTH < 24 HOURS Performed at [facility], [address], [place], [place] [address]    Report Status PENDING  Incomplete  Blood Culture (routine x 2)     Status: None (Preliminary result)   Collection Time: 02/08/22  5:42 AM   Specimen: Right Antecubital; Blood  Result Value Ref Range Status   Specimen Description   Final    RIGHT ANTECUBITAL Performed at [facility], [address], [place], [place] [address]    Special Requests   Final    BOTTLES DRAWN AEROBIC AND ANAEROBIC Blood Culture adequate volume Performed at [facility], [address] [place], [place] [address]     Culture  Setup Time   Final    GRAM POSITIVE COCCI AEROBIC BOTTLE ONLY CRITICAL RESULT CALLED TO, READ BACK BY AND VERIFIED WITH: Pershing Cox PHARMD AT 0330 02/09/2022 GA Performed at [facility], [place] [address], [place], [place] [address]    Culture GRAM POSITIVE COCCI  Final   Report Status PENDING  Incomplete  Urine Culture     Status: None   Collection Time: 02/08/22  5:42 AM   Specimen: In/Out Cath Urine  Result Value Ref Range Status   Specimen Description   Final  IN/OUT CATH URINE Performed at [facility], [address] [place], [place] [address]    Special Requests   Final    NONE Performed at [facility], [address], [place], [place] [address]    Culture   Final    NO GROWTH Performed at [facility], [place] [address] [place], [place] [address]    Report Status 02/09/2022 FINAL  Final  Blood Culture ID Panel (Reflexed)     Status: Abnormal   Collection Time: 02/08/22  5:42 AM  Result Value Ref Range Status   Enterococcus faecalis NOT DETECTED NOT DETECTED Final   Enterococcus Faecium NOT DETECTED NOT DETECTED Final   Listeria monocytogenes NOT DETECTED NOT DETECTED Final   Staphylococcus species DETECTED (A) NOT DETECTED Final    Comment: CRITICAL RESULT CALLED TO, READ BACK BY AND VERIFIED WITH: JASON BELUE PHARMD AT 0330 02/09/2022 GA    Staphylococcus aureus (BCID) NOT DETECTED NOT DETECTED Final   Staphylococcus epidermidis NOT DETECTED NOT DETECTED Final   Staphylococcus lugdunensis NOT DETECTED NOT DETECTED Final   Streptococcus species NOT DETECTED NOT DETECTED Final   Streptococcus agalactiae NOT DETECTED NOT DETECTED Final   Streptococcus pneumoniae NOT DETECTED NOT DETECTED Final   Streptococcus pyogenes NOT DETECTED NOT DETECTED Final   A.calcoaceticus-baumannii NOT DETECTED NOT DETECTED Final   Bacteroides fragilis NOT DETECTED NOT DETECTED Final   Enterobacterales NOT DETECTED NOT DETECTED Final    Enterobacter cloacae complex NOT DETECTED NOT DETECTED Final   Escherichia coli NOT DETECTED NOT DETECTED Final   Klebsiella aerogenes NOT DETECTED NOT DETECTED Final   Klebsiella oxytoca NOT DETECTED NOT DETECTED Final   Klebsiella pneumoniae NOT DETECTED NOT DETECTED Final   Proteus species NOT DETECTED NOT DETECTED Final   Salmonella species NOT DETECTED NOT DETECTED Final   Serratia marcescens NOT DETECTED NOT DETECTED Final   Haemophilus influenzae NOT DETECTED NOT DETECTED Final   Neisseria meningitidis NOT DETECTED NOT DETECTED Final   Pseudomonas aeruginosa NOT DETECTED NOT DETECTED Final   Stenotrophomonas maltophilia NOT DETECTED NOT DETECTED Final   Candida albicans NOT DETECTED NOT DETECTED Final   Candida auris NOT DETECTED NOT DETECTED Final   Candida glabrata NOT DETECTED NOT DETECTED Final   Candida krusei NOT DETECTED NOT DETECTED Final   Candida parapsilosis NOT DETECTED NOT DETECTED Final   Candida tropicalis NOT DETECTED NOT DETECTED Final   Cryptococcus neoformans/gattii NOT DETECTED NOT DETECTED Final    Comment: Performed at [facility], [address] [place],  [address]  MRSA Next Gen by PCR, Nasal     Status: None   Collection Time: 02/08/22  2:35 PM   Specimen: Nasal Mucosa; Nasal Swab  Result Value Ref Range Status   MRSA by PCR Next Gen NOT DETECTED NOT DETECTED Final    Comment: (NOTE) The GeneXpert MRSA Assay (FDA approved for NASAL specimens only), is one component of a comprehensive MRSA colonization surveillance program. It is not intended to diagnose MRSA infection nor to guide or monitor treatment for MRSA infections. Test performance is not FDA approved in patients less than 48 years old. Performed at [facility], [address] [place],  [address]       Radiology Studies: DG Knee 2 Views Right  Result Date: 02/08/2022 CLINICAL DATA:  71 year old female status post fall with weakness and dizziness.  EXAM: RIGHT KNEE - 1-2 VIEW COMPARISON:  None. FINDINGS: Bone mineralization is within normal limits. Tricompartmental joint space loss and degenerative  spurring, moderate to severe in both the medial and patellofemoral compartments. On cross-table lateral view no joint effusion identified. Patella intact. No acute osseous abnormality identified. No discrete soft tissue injury. IMPRESSION: No acute fracture or dislocation identified about the right knee. Advanced tricompartmental degeneration. Electronically Signed   By: Genevie Ann M.D.   On: 02/08/2022 06:44   CT HEAD WO CONTRAST (5MM)  Result Date: 02/08/2022 CLINICAL DATA:  71 year old female status post fall with weakness and dizziness. EXAM: CT HEAD WITHOUT CONTRAST TECHNIQUE: Contiguous axial images were obtained from the base of the skull through the vertex without intravenous contrast. RADIATION DOSE REDUCTION: This exam was performed according to the departmental dose-optimization program which includes automated exposure control, adjustment of the mA and/or kV according to patient size and/or use of iterative reconstruction technique. COMPARISON:  Report of head CT 11/24/1997 (no images available). FINDINGS: Brain: Cerebral volume is within normal limits for age. Bulky dural calcifications. No midline shift, ventriculomegaly, mass effect, evidence of mass lesion, intracranial hemorrhage or evidence of cortically based acute infarction. Gray-white matter differentiation is within normal limits throughout the brain. Vascular: Calcified atherosclerosis at the skull base. No suspicious intracranial vascular hyperdensity. Skull: No acute osseous abnormality identified. Hyperostosis frontalis. Sinuses/Orbits: Evidence of prior ethmoidectomy with asymmetric bilateral ethmoid and frontoethmoidal mucoperiosteal thickening, probable anterior left ethmoid mucocele on series 3, image 19, 17 mm long axis. Mild nasal septal nodularity. Frontal sinuses, sphenoid and  maxillary sinuses remain well aerated. Tympanic cavities are clear. Left mastoids are clear. There is trace right mastoid fluid. Other: Negative visible nasopharynx. No orbit or scalp soft tissue injury identified. IMPRESSION: 1. No acute traumatic injury identified. 2. Normal for age non contrast CT appearance of the brain. 3. Chronic appearing ethmoid sinus disease with possible prior ethmoid sinus surgery. 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 4. Trace right mastoid fluid, likely postinflammatory and significance doubtful. Electronically Signed   By: Genevie Ann M.D.   On: 02/08/2022 07:02   CT Chest W Contrast  Result Date: 02/08/2022 CLINICAL DATA:  71 year old female status post fall with weakness and dizziness. Sepsis. ?Breast cancer status post radiation?Marland Kitchen EXAM: CT CHEST WITH CONTRAST TECHNIQUE: Multidetector CT imaging of the chest was performed during intravenous contrast administration. RADIATION DOSE REDUCTION: This exam was performed according to the departmental dose-optimization program which includes automated exposure control, adjustment of the mA and/or kV according to patient size and/or use of iterative reconstruction technique. CONTRAST:  15mL OMNIPAQUE IOHEXOL 300 MG/ML  SOLN COMPARISON:  Chest CT 08/23/2017. Cervical spine CT today. FINDINGS: Cardiovascular: Calcified aortic atherosclerosis. Mild central pulmonary artery enlargement is chronic. Cardiac size at the upper limits of normal. No pericardial effusion. Mediastinum/Nodes: Small mediastinal lymph nodes have not significantly changed since 2018 and are likely postinflammatory. Lungs/Pleura: Major airways are patent. New subpleural and patchy peribronchial opacity in the anterior left upper lobe and lingula is likely post radiation related in this setting (series 3, image 61, see left chest wall findings below). Elsewhere subpleural scarring is chronic but progressed since 2018, especially in the lateral segment of the right middle  lobe with associated bronchiectasis there. No pleural effusion. No other confluent pulmonary opacity. No suspicious pulmonary nodule. Small 4 mm left costophrenic angle nodule is stable since 2018 and benign. Upper Abdomen: Chronically absent gallbladder. Negative visible liver, spleen, adrenal glands, kidneys, and bowel in the upper abdomen. Fatty atrophied pancreas. Musculoskeletal: Osteopenia. No acute rib fracture identified. Sternum intact. Advanced disc degeneration in the lower thoracic and visible upper lumbar  spine. Chronic flowing thoracic endplate osteophytes and interbody ankylosis. Other findings: Partially visible swollen left breast with extensive inflammatory stranding and large roughly 11 cm diameter encapsulated fluid collection (series 2, image 43) with simple fluid density. Left axillary surgical clips. No axillary lymphadenopathy. IMPRESSION: 1. Partially visible swollen left breast with large, roughly 11 cm diameter encapsulated fluid collection. This may be a postoperative seroma. And extensive surrounding chest wall inflammation, with similar inflammatory changes in the underlying anterior left lung, is likely sequelae of XRT. Left axillary surgical clips. 2. No superimposed acute traumatic injury identified in the chest. 3. No definite thoracic metastatic disease; small mediastinal lymph nodes have not significantly changed since 2018 and are likely postinflammatory. 4. Aortic Atherosclerosis (ICD10-I70.0). Electronically Signed   By: Genevie Ann M.D.   On: 02/08/2022 07:14   CT Cervical Spine Wo Contrast  Result Date: 02/08/2022 CLINICAL DATA:  71 year old female status post fall with weakness and dizziness. EXAM: CT CERVICAL SPINE WITHOUT CONTRAST TECHNIQUE: Multidetector CT imaging of the cervical spine was performed without intravenous contrast. Multiplanar CT image reconstructions were also generated. RADIATION DOSE REDUCTION: This exam was performed according to the departmental  dose-optimization program which includes automated exposure control, adjustment of the mA and/or kV according to patient size and/or use of iterative reconstruction technique. COMPARISON:  Head and chest CT today reported separately. FINDINGS: Alignment: Mild straightening of cervical lordosis. Cervicothoracic junction alignment is within normal limits. Bilateral posterior element alignment is within normal limits. Skull base and vertebrae: Visualized skull base is intact. No atlanto-occipital dissociation. C1 and C2 appear intact and aligned. Mild motion artifact C3 and C4. No acute osseous abnormality identified; corticated ossific fragment at chronically hypertrophied anterior left C2-C3 facets (sagittal image 77). And questionable nondisplaced linear lucency through the right C7 facet on series 6, image 77 is not correlated on the other imaging planes and appears to be artifact. Soft tissues and spinal canal: No prevertebral fluid or swelling. No visible canal hematoma. Negative visible noncontrast neck soft tissues aside from left carotid bifurcation atherosclerosis. Disc levels: Upper cervical facet arthropathy maximal on the left C2-C3. Lower cervical disc and endplate degeneration. Mild if any associated spinal stenosis at C5-C6. Upper chest: Chest CT reported separately. Other: Right mandible motion artifact. Carious dentition with bulky periapical lucency left maxillary molar, right maxillary bicuspid. IMPRESSION: 1. No acute traumatic injury identified in the cervical spine. 2. Cervical spine degeneration with possible mild spinal stenosis at C5-C6. 3. Poor dentition. Electronically Signed   By: Genevie Ann M.D.   On: 02/08/2022 07:07   DG Chest Port 1 View  Result Date: 02/08/2022 CLINICAL DATA:  71 year old female status post fall with weakness and dizziness. EXAM: PORTABLE CHEST 1 VIEW COMPARISON:  CTA chest 08/23/2017 and earlier. FINDINGS: Portable AP upright view at 0610 hours. Lower lung volumes.  Allowing for this cardiac and mediastinal contours remain within normal limits. The patient is mildly rotated to the left. Visualized tracheal air column is within normal limits. Allowing for portable technique the lungs are clear. No pneumothorax or pleural effusion. Paucity of bowel gas in the upper abdomen. No acute osseous abnormality identified. IMPRESSION: Low lung volumes. No acute cardiopulmonary abnormality or acute traumatic injury identified. Electronically Signed   By: Genevie Ann M.D.   On: 02/08/2022 06:43     Scheduled Meds:  apixaban  5 mg Oral BID   [START ON 02/10/2022] aspirin  81 mg Oral Daily   atorvastatin  80 mg Oral QPM   Chlorhexidine  Gluconate Cloth  6 each Topical Daily   dorzolamide-timolol  1 drop Both Eyes BID   fesoterodine  4 mg Oral Daily   fluticasone  2 spray Each Nare Daily   insulin aspart  0-15 Units Subcutaneous TID WC   latanoprost  1 drop Both Eyes QHS   letrozole  2.5 mg Oral Daily   loratadine  10 mg Oral Daily   metoprolol tartrate  25 mg Oral BID   multivitamin with minerals  1 tablet Oral Daily   pantoprazole  40 mg Oral Daily   polyvinyl alcohol  1 drop Both Eyes Daily   pregabalin  100 mg Oral TID   sertraline  100 mg Oral Daily   Continuous Infusions:  piperacillin-tazobactam (ZOSYN)  IV 3.375 g (02/09/22 1518)   vancomycin 166.7 mL/hr at 02/09/22 1004     LOS: 1 day     Enzo Bi, MD [facility] If 7PM-7AM, please contact night-coverage 02/09/2022, 5:57 PM

## 2022-02-09 NOTE — Progress Notes (Signed)
PHARMACY - PHYSICIAN COMMUNICATION CRITICAL VALUE ALERT - BLOOD CULTURE IDENTIFICATION (BCID)  Rhonda Thornton is an 71 y.o. female who presented to [facility] on 02/08/2022 with a chief complaint of Sepsis  Assessment:  GPC in 1 of 4 bottles, Staph species.  Most likely a contaminant.  (include suspected source if known)  Name of physician (or Provider) Contacted: Rachael Fee, NP   Current antibiotics: Vanc, Cefepime  Changes to prescribed antibiotics recommended:  Blood Cx results are most likely contaminant.  Continue current abx regimen.   Results for orders placed or performed during the hospital encounter of 02/08/22  Blood Culture ID Panel (Reflexed) (Collected: 02/08/2022  5:42 AM)  Result Value Ref Range   Enterococcus faecalis NOT DETECTED NOT DETECTED   Enterococcus Faecium NOT DETECTED NOT DETECTED   Listeria monocytogenes NOT DETECTED NOT DETECTED   Staphylococcus species DETECTED (A) NOT DETECTED   Staphylococcus aureus (BCID) NOT DETECTED NOT DETECTED   Staphylococcus epidermidis NOT DETECTED NOT DETECTED   Staphylococcus lugdunensis NOT DETECTED NOT DETECTED   Streptococcus species NOT DETECTED NOT DETECTED   Streptococcus agalactiae NOT DETECTED NOT DETECTED   Streptococcus pneumoniae NOT DETECTED NOT DETECTED   Streptococcus pyogenes NOT DETECTED NOT DETECTED   A.calcoaceticus-baumannii NOT DETECTED NOT DETECTED   Bacteroides fragilis NOT DETECTED NOT DETECTED   Enterobacterales NOT DETECTED NOT DETECTED   Enterobacter cloacae complex NOT DETECTED NOT DETECTED   Escherichia coli NOT DETECTED NOT DETECTED   Klebsiella aerogenes NOT DETECTED NOT DETECTED   Klebsiella oxytoca NOT DETECTED NOT DETECTED   Klebsiella pneumoniae NOT DETECTED NOT DETECTED   Proteus species NOT DETECTED NOT DETECTED   Salmonella species NOT DETECTED NOT DETECTED   Serratia marcescens NOT DETECTED NOT DETECTED   Haemophilus influenzae NOT DETECTED NOT DETECTED   Neisseria meningitidis NOT  DETECTED NOT DETECTED   Pseudomonas aeruginosa NOT DETECTED NOT DETECTED   Stenotrophomonas maltophilia NOT DETECTED NOT DETECTED   Candida albicans NOT DETECTED NOT DETECTED   Candida auris NOT DETECTED NOT DETECTED   Candida glabrata NOT DETECTED NOT DETECTED   Candida krusei NOT DETECTED NOT DETECTED   Candida parapsilosis NOT DETECTED NOT DETECTED   Candida tropicalis NOT DETECTED NOT DETECTED   Cryptococcus neoformans/gattii NOT DETECTED NOT DETECTED    Rhonda Thornton D 02/09/2022  4:07 AM

## 2022-02-09 NOTE — Consult Note (Signed)
[facility] Consultation Note  Patient ID: KRISSIE MERRICK, MRN: [id number], DOB/AGE: [birth year] 71 y.o. Admit date: 02/08/2022   Date of Consult: 02/09/2022 Primary Physician: Denton Lank, MD Primary Cardiologist: [facility]  Chief Complaint:  Chief Complaint  Patient presents with   Dizziness   Weakness   Reason for Consult:  Atrial fibrillation  HPI: 71 y.o. female with known chronic obstructive pulmonary disease on chronic 2 L of oxygen diabetes hypertension hyperlipidemia and mild aortic valve stenosis.  The patient has been physically doing relatively well although is not very mobile and ended up with a trip and a fall.  With this issue was significantly weak and fatigued and unable to do what she needed to and called EMS.  At that time she was found to have atrial fibrillation with rapid ventricular rates with nonspecific ST changes.  The patient was given intravenous diltiazem and metoprolol and spontaneously converted to normal sinus rhythm.  Since then she has felt much better at this time with more hemodynamic stability.  There is no evidence of congestive heart failure and/or anginal symptoms at the time.  Currently she feels better and is hemodynamically stable  Past Medical History:  Diagnosis Date   Anemia    Anginal pain ([place])    Arthritis    Breast cancer ([place])    2022 [facility]   Chronic pain syndrome    Degenerative disc disease, lumbar    Edema of left lower extremity    Gastroparesis due to secondary diabetes (HCC)    GERD (gastroesophageal reflux disease)    Heart murmur    History of seizure disorder    Hyperlipidemia    Hypertension    Invasive carcinoma of breast ([place]) 04/25/2021   LEFT; stage 1A; grade I invasive mammary carcinoma; ER/PR (+); HER2/neu (-)   Mild aortic stenosis    Mild mitral regurgitation    Mild tricuspid regurgitation    Morbid obesity (HCC)    Opioid use    therapeutic use for Dx of chronic pain syndrome   OSA (obstructive  sleep apnea)    Personal history of radiation therapy    Recurrent falls while walking    uses rolling walker    Requires supplemental oxygen    2L/[place] at bedtime   T2DM (type 2 diabetes mellitus) ([place])    Tubular adenoma of colon       Surgical History:  Past Surgical History:  Procedure Laterality Date   ABDOMINAL HYSTERECTOMY  1991   partial   BREAST BIOPSY Left 0505/2022   u/s bx-"X" clip-INVASIVE MAMMARY CARCINOMA WITH TUBULAR FEATURES   BREAST CYST ASPIRATION Right 07/16/2016   neg   BREAST LUMPECTOMY,RADIO FREQ LOCALIZER,AXILLARY SENTINEL LYMPH NODE BIOPSY Left 05/22/2021   Procedure: BREAST LUMPECTOMY,RADIO FREQ LOCALIZER,AXILLARY SENTINEL LYMPH NODE BIOPSY;  Surgeon: Ronny Bacon, MD;  Location: [facility] ORS;  Service: General;  Laterality: Left;   CHOLECYSTECTOMY     COLONOSCOPY WITH PROPOFOL N/A 07/30/2016   Procedure: COLONOSCOPY WITH PROPOFOL;  Surgeon: Lollie Sails, MD;  Location: [facility] ENDOSCOPY;  Service: Endoscopy;  Laterality: N/A;   ESOPHAGOGASTRODUODENOSCOPY (EGD) WITH PROPOFOL N/A 09/09/2017   Procedure: ESOPHAGOGASTRODUODENOSCOPY (EGD) WITH PROPOFOL;  Surgeon: Toledo, Benay Pike, MD;  Location: [facility] ENDOSCOPY;  Service: Endoscopy;  Laterality: N/A;   EYE SURGERY Right    for pterygium   FUNCTIONAL ENDOSCOPIC SINUS SURGERY     TONSILLECTOMY       Home Meds: Prior to Admission medications   Medication Sig Start Date End Date  Taking? Authorizing Provider  amLODipine (NORVASC) 5 MG tablet Take 5 mg by mouth daily.   Yes [provider]  aspirin 325 MG EC tablet Take 325 mg by mouth daily.   Yes [provider]  atorvastatin (LIPITOR) 80 MG tablet Take 80 mg by mouth daily.   Yes [provider]  calcium carbonate (TUMS - DOSED IN MG ELEMENTAL CALCIUM) 500 MG chewable tablet Chew 1,000 mg by mouth 3 (three) times daily as needed for indigestion or heartburn.   Yes [provider]  Carboxymethylcellulose Sodium (ARTIFICIAL TEARS  OP) Place 1 drop into both eyes daily.   Yes [provider]  cetirizine (ZYRTEC) 10 MG tablet Take 10 mg by mouth daily.   Yes [provider]  dorzolamide-timolol (COSOPT) 22.3-6.8 MG/ML ophthalmic solution Place 1 drop into both eyes 2 (two) times daily. 02/01/21  Yes [provider]  fluticasone (FLONASE) 50 MCG/ACT nasal spray Place 2 sprays into both nostrils daily. 09/05/20  Yes [provider]  glipiZIDE (GLUCOTROL XL) 5 MG 24 hr tablet Take 5 mg by mouth daily.   Yes [provider]  HYDROcodone-acetaminophen (NORCO) 7.5-325 MG tablet Take 1 tablet by mouth 2 (two) times daily.   Yes [provider]  latanoprost (XALATAN) 0.005 % ophthalmic solution Place 1 drop into both eyes at bedtime. 11/05/20  Yes [provider]  letrozole (FEMARA) 2.5 MG tablet Take 1 tablet (2.5 mg total) by mouth daily. 07/24/21  Yes Lloyd Huger, MD  lisinopril (PRINIVIL,ZESTRIL) 10 MG tablet Take 10 mg by mouth daily.   Yes [provider]  metFORMIN (GLUCOPHAGE) 500 MG tablet Take 1,000 mg by mouth 2 (two) times daily.   Yes [provider]  Multiple Vitamin (DAILY-VITE MULTIVITAMIN) TABS Take 1 tablet by mouth daily. 01/30/21  Yes [provider]  naproxen (NAPROSYN) 500 MG tablet Take 500 mg by mouth 2 (two) times daily. 01/28/22  Yes [provider]  OXYGEN Inhale 2 L into the lungs at bedtime.   Yes [provider]  phenylephrine (SUDAFED PE) 10 MG TABS tablet Take 10 mg by mouth at bedtime.   Yes [provider]  pregabalin (LYRICA) 100 MG capsule Take 100 mg by mouth 3 (three) times daily.   Yes [provider]  sertraline (ZOLOFT) 100 MG tablet Take 100 mg by mouth daily.   Yes [provider]  tolterodine (DETROL LA) 4 MG 24 hr capsule Take 4 mg by mouth daily. 09/05/20  Yes [provider]  alum & mag hydroxide-simeth (MAALOX/MYLANTA) 200-200-20 MG/5ML suspension  Take 30 mLs by mouth every 6 (six) hours as needed for indigestion or heartburn.    [provider]  ibuprofen (ADVIL,MOTRIN) 200 MG tablet Take 200 mg by mouth every 6 (six) hours as needed for headache or moderate pain. Patient not taking: Reported on 02/08/2022    [provider]  Multiple Vitamin (MULTIVITAMIN) capsule Take 1 capsule by mouth daily. Patient not taking: Reported on 02/08/2022    [provider]  pantoprazole (PROTONIX) 40 MG tablet Take 1 tablet (40 mg total) by mouth daily. 08/23/17 08/22/21  Harvest Dark, MD  silver sulfADIAZINE (SILVADENE) 1 % cream Apply 1 application topically daily. Patient not taking: Reported on 02/08/2022 07/16/21   [name], MD  sodium chloride (OCEAN) 0.65 % SOLN nasal spray Place 1 spray into both nostrils as needed for congestion.    [provider]    Inpatient Medications:   aspirin  325 mg Oral Daily   atorvastatin  80 mg Oral QPM   Chlorhexidine Gluconate Cloth  6 each Topical Daily   dorzolamide-timolol  1 drop Both Eyes BID   enoxaparin (LOVENOX) injection  1 mg/kg Subcutaneous BID   fesoterodine  4 mg Oral Daily   fluticasone  2 spray Each Nare Daily   insulin aspart  0-15 Units Subcutaneous TID WC   latanoprost  1 drop Both Eyes QHS   letrozole  2.5 mg Oral Daily   loratadine  10 mg Oral Daily   metoprolol tartrate  25 mg Oral BID   multivitamin with minerals  1 tablet Oral Daily   pantoprazole  40 mg Oral Daily   polyvinyl alcohol  1 drop Both Eyes Daily   pregabalin  100 mg Oral TID   sertraline  100 mg Oral Daily    piperacillin-tazobactam (ZOSYN)  IV 3.375 g (02/09/22 0602)   vancomycin      Allergies: No Known Allergies  Social History   Socioeconomic History   Marital status: Widowed    Spouse name: Not on file   Number of children: 0   Years of education: Not on file   Highest education level: Not on file  Occupational History   Not on file  Tobacco Use   Smoking  status: Never   Smokeless tobacco: Never  Vaping Use   Vaping Use: Never used  Substance and Sexual Activity   Alcohol use: No   Drug use: Never   Sexual activity: Not on file  Other Topics Concern   Not on file  Social History Narrative   Not on file   Social Determinants of Health   Financial Resource Strain: Not on file  Food Insecurity: Not on file  Transportation Needs: Not on file  Physical Activity: Not on file  Stress: Not on file  Social Connections: Not on file  Intimate Partner Violence: Not on file     Family History  Problem Relation Age of Onset   Stomach cancer Mother    Colon cancer Brother    Breast cancer Neg Hx      Review of Systems Positive for shortness of breath Negative for: General:  chills, fever, night sweats or weight changes.  Cardiovascular: PND orthopnea syncope dizziness  Dermatological skin lesions rashes Respiratory: Cough congestion Urologic: Frequent urination urination at night and hematuria Abdominal: negative for nausea, vomiting, diarrhea, bright red blood per rectum, melena, or hematemesis Neurologic: negative for visual changes, and/or hearing changes  All other systems reviewed and are otherwise negative except as noted above.  Labs: No results for input(s): CKTOTAL, CKMB, TROPONINI in the last 72 hours. Lab Results  Component Value Date   WBC 8.9 02/09/2022   HGB 8.8 (L) 02/09/2022   HCT 28.6 (L) 02/09/2022   MCV 89.7 02/09/2022   PLT 158 02/09/2022    Recent Labs  Lab 02/08/22 0542 02/09/22 0440  NA 136 133*  K 3.5 3.5  CL 97* 99  CO2 28 27  BUN 25* 20  CREATININE 0.81 0.59  CALCIUM 9.5 8.4*  PROT 7.7  --   BILITOT 1.1  --   ALKPHOS 63  --   ALT 14  --   AST 18  --   GLUCOSE 200* 175*   No results found for: CHOL, HDL, LDLCALC, TRIG No results found for: DDIMER  Radiology/Studies:  DG Knee 2 Views Right  Result Date: 02/08/2022 CLINICAL DATA:  71 year old female status post fall with weakness  and  dizziness. EXAM: RIGHT KNEE - 1-2 VIEW COMPARISON:  None. FINDINGS: Bone mineralization is within normal limits. Tricompartmental joint space loss and degenerative spurring, moderate to severe in both the medial and patellofemoral compartments. On cross-table lateral view no joint effusion identified. Patella intact. No acute osseous abnormality identified. No discrete soft tissue injury. IMPRESSION: No acute fracture or dislocation identified about the right knee. Advanced tricompartmental degeneration. Electronically Signed   By: Genevie Ann M.D.   On: 02/08/2022 06:44   CT HEAD WO CONTRAST (5MM)  Result Date: 02/08/2022 CLINICAL DATA:  71 year old female status post fall with weakness and dizziness. EXAM: CT HEAD WITHOUT CONTRAST TECHNIQUE: Contiguous axial images were obtained from the base of the skull through the vertex without intravenous contrast. RADIATION DOSE REDUCTION: This exam was performed according to the departmental dose-optimization program which includes automated exposure control, adjustment of the mA and/or kV according to patient size and/or use of iterative reconstruction technique. COMPARISON:  Report of head CT 11/24/1997 (no images available). FINDINGS: Brain: Cerebral volume is within normal limits for age. Bulky dural calcifications. No midline shift, ventriculomegaly, mass effect, evidence of mass lesion, intracranial hemorrhage or evidence of cortically based acute infarction. Gray-white matter differentiation is within normal limits throughout the brain. Vascular: Calcified atherosclerosis at the skull base. No suspicious intracranial vascular hyperdensity. Skull: No acute osseous abnormality identified. Hyperostosis frontalis. Sinuses/Orbits: Evidence of prior ethmoidectomy with asymmetric bilateral ethmoid and frontoethmoidal mucoperiosteal thickening, probable anterior left ethmoid mucocele on series 3, image 19, 17 mm long axis. Mild nasal septal nodularity. Frontal sinuses,  sphenoid and maxillary sinuses remain well aerated. Tympanic cavities are clear. Left mastoids are clear. There is trace right mastoid fluid. Other: Negative visible nasopharynx. No orbit or scalp soft tissue injury identified. IMPRESSION: 1. No acute traumatic injury identified. 2. Normal for age non contrast CT appearance of the brain. 3. Chronic appearing ethmoid sinus disease with possible prior ethmoid sinus surgery. 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 4. Trace right mastoid fluid, likely postinflammatory and significance doubtful. Electronically Signed   By: Genevie Ann M.D.   On: 02/08/2022 07:02   CT Chest W Contrast  Result Date: 02/08/2022 CLINICAL DATA:  71 year old female status post fall with weakness and dizziness. Sepsis. ?Breast cancer status post radiation?Marland Kitchen EXAM: CT CHEST WITH CONTRAST TECHNIQUE: Multidetector CT imaging of the chest was performed during intravenous contrast administration. RADIATION DOSE REDUCTION: This exam was performed according to the departmental dose-optimization program which includes automated exposure control, adjustment of the mA and/or kV according to patient size and/or use of iterative reconstruction technique. CONTRAST:  35mL OMNIPAQUE IOHEXOL 300 MG/ML  SOLN COMPARISON:  Chest CT 08/23/2017. Cervical spine CT today. FINDINGS: Cardiovascular: Calcified aortic atherosclerosis. Mild central pulmonary artery enlargement is chronic. Cardiac size at the upper limits of normal. No pericardial effusion. Mediastinum/Nodes: Small mediastinal lymph nodes have not significantly changed since 2018 and are likely postinflammatory. Lungs/Pleura: Major airways are patent. New subpleural and patchy peribronchial opacity in the anterior left upper lobe and lingula is likely post radiation related in this setting (series 3, image 61, see left chest wall findings below). Elsewhere subpleural scarring is chronic but progressed since 2018, especially in the lateral segment of the  right middle lobe with associated bronchiectasis there. No pleural effusion. No other confluent pulmonary opacity. No suspicious pulmonary nodule. Small 4 mm left costophrenic angle nodule is stable since 2018 and benign. Upper Abdomen: Chronically absent gallbladder. Negative visible liver, spleen, adrenal glands, kidneys, and bowel in the  upper abdomen. Fatty atrophied pancreas. Musculoskeletal: Osteopenia. No acute rib fracture identified. Sternum intact. Advanced disc degeneration in the lower thoracic and visible upper lumbar spine. Chronic flowing thoracic endplate osteophytes and interbody ankylosis. Other findings: Partially visible swollen left breast with extensive inflammatory stranding and large roughly 11 cm diameter encapsulated fluid collection (series 2, image 43) with simple fluid density. Left axillary surgical clips. No axillary lymphadenopathy. IMPRESSION: 1. Partially visible swollen left breast with large, roughly 11 cm diameter encapsulated fluid collection. This may be a postoperative seroma. And extensive surrounding chest wall inflammation, with similar inflammatory changes in the underlying anterior left lung, is likely sequelae of XRT. Left axillary surgical clips. 2. No superimposed acute traumatic injury identified in the chest. 3. No definite thoracic metastatic disease; small mediastinal lymph nodes have not significantly changed since 2018 and are likely postinflammatory. 4. Aortic Atherosclerosis (ICD10-I70.0). Electronically Signed   By: Genevie Ann M.D.   On: 02/08/2022 07:14   CT Cervical Spine Wo Contrast  Result Date: 02/08/2022 CLINICAL DATA:  71 year old female status post fall with weakness and dizziness. EXAM: CT CERVICAL SPINE WITHOUT CONTRAST TECHNIQUE: Multidetector CT imaging of the cervical spine was performed without intravenous contrast. Multiplanar CT image reconstructions were also generated. RADIATION DOSE REDUCTION: This exam was performed according to the  departmental dose-optimization program which includes automated exposure control, adjustment of the mA and/or kV according to patient size and/or use of iterative reconstruction technique. COMPARISON:  Head and chest CT today reported separately. FINDINGS: Alignment: Mild straightening of cervical lordosis. Cervicothoracic junction alignment is within normal limits. Bilateral posterior element alignment is within normal limits. Skull base and vertebrae: Visualized skull base is intact. No atlanto-occipital dissociation. C1 and C2 appear intact and aligned. Mild motion artifact C3 and C4. No acute osseous abnormality identified; corticated ossific fragment at chronically hypertrophied anterior left C2-C3 facets (sagittal image 77). And questionable nondisplaced linear lucency through the right C7 facet on series 6, image 77 is not correlated on the other imaging planes and appears to be artifact. Soft tissues and spinal canal: No prevertebral fluid or swelling. No visible canal hematoma. Negative visible noncontrast neck soft tissues aside from left carotid bifurcation atherosclerosis. Disc levels: Upper cervical facet arthropathy maximal on the left C2-C3. Lower cervical disc and endplate degeneration. Mild if any associated spinal stenosis at C5-C6. Upper chest: Chest CT reported separately. Other: Right mandible motion artifact. Carious dentition with bulky periapical lucency left maxillary molar, right maxillary bicuspid. IMPRESSION: 1. No acute traumatic injury identified in the cervical spine. 2. Cervical spine degeneration with possible mild spinal stenosis at C5-C6. 3. Poor dentition. Electronically Signed   By: Genevie Ann M.D.   On: 02/08/2022 07:07   CT KNEE LEFT WO CONTRAST  Result Date: 01/25/2022 CLINICAL DATA:  Chronic knee pain for the past 3 weeks EXAM: CT OF THE LEFT KNEE WITHOUT CONTRAST TECHNIQUE: Multidetector CT imaging of the LEFT knee was performed according to the standard protocol.  Multiplanar CT image reconstructions were also generated. RADIATION DOSE REDUCTION: This exam was performed according to the departmental dose-optimization program which includes automated exposure control, adjustment of the mA and/or kV according to patient size and/or use of iterative reconstruction technique. COMPARISON:  None. FINDINGS: Bones/Joint/Cartilage Hip demonstrates no fracture or dislocation. No lytic or blastic lesion. Moderate osteoarthritis of the left SI joint. Knee demonstrates no fracture or dislocation. No lytic or blastic lesion. Severe osteoarthritis of the medial femorotibial compartment with marginal osteophytes. Mild lateral femorotibial compartment with small  marginal osteophytes. Moderate-severe osteoarthritis of the patellofemoral compartment with marginal osteophytes. 11 mm loose body along the anterior aspect of the ACL. Severe osteoarthritis of the proximal tibia-fibular joint. Small joint effusion. Ankle demonstrates no fracture or dislocation. No lytic or blastic lesion. Moderate osteoarthritis of the tibiotalar joint. Mild osteoarthritis of the talonavicular joint. Mild osteoarthritis of the posterior subtalar joint. Ligaments Ligaments are suboptimally evaluated by CT. Muscles and Tendons Muscles are normal. Quadriceps tendon and patellar tendon are intact. Flexor, extensor, peroneal and Achilles tendons are grossly intact. Soft tissue No fluid collection or hematoma.  No soft tissue mass. IMPRESSION: 1. Tricompartmental osteoarthritis of the left knee, most severe in the medial femorotibial compartment. Electronically Signed   By: Kathreen Devoid M.D.   On: 01/25/2022 10:54   DG Chest Port 1 View  Result Date: 02/08/2022 CLINICAL DATA:  71 year old female status post fall with weakness and dizziness. EXAM: PORTABLE CHEST 1 VIEW COMPARISON:  CTA chest 08/23/2017 and earlier. FINDINGS: Portable AP upright view at 0610 hours. Lower lung volumes. Allowing for this cardiac and  mediastinal contours remain within normal limits. The patient is mildly rotated to the left. Visualized tracheal air column is within normal limits. Allowing for portable technique the lungs are clear. No pneumothorax or pleural effusion. Paucity of bowel gas in the upper abdomen. No acute osseous abnormality identified. IMPRESSION: Low lung volumes. No acute cardiopulmonary abnormality or acute traumatic injury identified. Electronically Signed   By: Genevie Ann M.D.   On: 02/08/2022 06:43    EKG: Atrial fibrillation rapid ventricular rate  Weights: Filed Weights   02/08/22 0536 02/08/22 1441  Weight: 108.4 kg 108.4 kg     Physical Exam: Blood pressure 116/70, pulse 72, temperature 98.6 F (37 C), temperature source Oral, resp. rate 16, height $RemoveBe'5\' 4"'envRQyzeh$  (1.626 m), weight 108.4 kg, SpO2 94 %. Body mass index is 41.02 kg/m. General: Well developed, well nourished, in no acute distress. Head eyes ears nose throat: Normocephalic, atraumatic, sclera non-icteric, no xanthomas, nares are without discharge. No apparent thyromegaly and/or mass  Lungs: Normal respiratory effort.  Few wheezes, no rales, no rhonchi.  Heart: RRR with normal S1 S2. no murmur gallop, no rub, PMI is normal size and placement, carotid upstroke normal without bruit, jugular venous pressure is normal Abdomen: Soft, non-tender, non-distended with normoactive bowel sounds. No hepatomegaly. No rebound/guarding. No obvious abdominal masses. Abdominal aorta is normal size without bruit Extremities: Trace edema. no cyanosis, no clubbing, no ulcers  Peripheral : 2+ bilateral upper extremity pulses, 2+ bilateral femoral pulses, 2+ bilateral dorsal pedal pulse Neuro: Alert and oriented. No facial asymmetry. No focal deficit. Moves all extremities spontaneously. Musculoskeletal: Normal muscle tone without kyphosis Psych:  Responds to questions appropriately with a normal affect.    Assessment: 71 year old female with known chronic  obstructive pulmonary disease diabetes hypertension hyperlipidemia mild aortic valve stenosis with fall and acute onset of atrial fibrillation with rapid ventricular rate now spontaneously converted to normal sinus rhythm and improved without evidence of acute coronary syndrome or congestive heart failure  Plan: 1.  Continue low-dose metoprolol for maintenance of normal sinus rhythm and treatment of hypertension 2.  Continue anticoagulation for further risk reduction and stroke with atrial fibrillation and would instate Eliquis 5 mg twice per day prior to discharge for further risk reduction stroke for at least 1 month and then reevaluate significance of recurrence 3.  Consider echocardiogram for LV systolic dysfunction valvular heart disease contributing to above with adjustments of medications 4.  No change in current medical regimen for hypertension hyperlipidemia and diabetes control 5.  Begin ambulation and follow-up for improvements of symptoms and okay for transfer to telemetry  Signed, Corey Skains M.D. [facility] 02/09/2022, 9:42 AM

## 2022-02-10 ENCOUNTER — Inpatient Hospital Stay
Admit: 2022-02-10 | Discharge: 2022-02-10 | Disposition: A | Discharge status: Home / Self Care | Payer: Medicare HMO | Attending: Internal Medicine | Admitting: Internal Medicine

## 2022-02-10 DIAGNOSIS — A419 Sepsis, unspecified organism: Secondary | ICD-10-CM | POA: Diagnosis not present

## 2022-02-10 LAB — BASIC METABOLIC PANEL
Anion gap: 7 (ref 5–15)
BUN: 15 mg/dL (ref 8–23)
CO2: 27 mmol/L (ref 22–32)
Calcium: 8.1 mg/dL — ABNORMAL LOW (ref 8.9–10.3)
Chloride: 101 mmol/L (ref 98–111)
Creatinine, Ser: 0.62 mg/dL (ref 0.44–1.00)
GFR, Estimated: >60 mL/min (ref >60)
Glucose, Bld: 179 mg/dL — ABNORMAL HIGH (ref 70–99)
Potassium: 3.9 mmol/L (ref 3.5–5.1)
Sodium: 135 mmol/L (ref 135–145)

## 2022-02-10 LAB — CBC
HCT: 26 % — ABNORMAL LOW (ref 36.0–46.0)
Hemoglobin: 8.1 g/dL — ABNORMAL LOW (ref 12.0–15.0)
MCH: 27.7 pg (ref 26.0–34.0)
MCHC: 31.2 g/dL (ref 30.0–36.0)
MCV: 89 fL (ref 80.0–100.0)
Platelets: 151 10*3/uL (ref 150–400)
RBC: 2.92 MIL/uL — ABNORMAL LOW (ref 3.87–5.11)
RDW: 14.9 % (ref 11.5–15.5)
WBC: 6.2 10*3/uL (ref 4.0–10.5)
nRBC: 0 % (ref 0.0–0.2)

## 2022-02-10 LAB — GLUCOSE, CAPILLARY
Glucose-Capillary: 168 mg/dL — ABNORMAL HIGH (ref 70–99)
Glucose-Capillary: 167 mg/dL — ABNORMAL HIGH (ref 70–99)
Glucose-Capillary: 174 mg/dL — ABNORMAL HIGH (ref 70–99)
Glucose-Capillary: 229 mg/dL — ABNORMAL HIGH (ref 70–99)

## 2022-02-10 LAB — MAGNESIUM: Magnesium: 1.8 mg/dL (ref 1.7–2.4)

## 2022-02-10 MED ORDER — AMIODARONE LOAD VIA INFUSION
150.0000 mg | Freq: Once | INTRAVENOUS | Status: AC
  Administered 2022-02-10: 150 mg via INTRAVENOUS
  Filled 2022-02-10: qty 83.34

## 2022-02-10 MED ORDER — CALCIUM CARBONATE ANTACID 500 MG PO CHEW
1.0000 | CHEWABLE_TABLET | Freq: Three times a day (TID) | ORAL | Status: DC | PRN
Start: 2022-02-10 — End: 2022-02-18
  Administered 2022-02-11 – 2022-02-13 (×2): 200 mg via ORAL
  Filled 2022-02-10 (×2): qty 1

## 2022-02-10 MED ORDER — DILTIAZEM HCL ER COATED BEADS 120 MG PO CP24
240.0000 mg | ORAL_CAPSULE | Freq: Every day | ORAL | Status: DC
  Administered 2022-02-10 – 2022-02-16 (×5): 240 mg via ORAL
  Filled 2022-02-10 (×10): qty 2

## 2022-02-10 MED ORDER — ALUM & MAG HYDROXIDE-SIMETH 200-200-20 MG/5ML PO SUSP
15.0000 mL | Freq: Four times a day (QID) | ORAL | Status: DC | PRN
  Administered 2022-02-10 – 2022-02-18 (×10): 15 mL via ORAL
  Filled 2022-02-10 (×10): qty 30

## 2022-02-10 MED ORDER — METOPROLOL TARTRATE 50 MG PO TABS
50.0000 mg | ORAL_TABLET | Freq: Two times a day (BID) | ORAL | Status: DC
  Administered 2022-02-10 – 2022-02-12 (×5): 50 mg via ORAL
  Filled 2022-02-10 (×5): qty 1

## 2022-02-10 MED ORDER — DILTIAZEM LOAD VIA INFUSION
10.0000 mg | Freq: Once | INTRAVENOUS | Status: AC
  Administered 2022-02-10: 10 mg via INTRAVENOUS
  Filled 2022-02-10: qty 10

## 2022-02-10 MED ORDER — DILTIAZEM HCL-DEXTROSE 125-5 MG/125ML-% IV SOLN (PREMIX)
5.0000 mg/h | INTRAVENOUS | Status: DC
  Administered 2022-02-10: 5 mg/h via INTRAVENOUS
  Administered 2022-02-10: 10 mg/h via INTRAVENOUS
  Administered 2022-02-10: 12.5 mg/h via INTRAVENOUS
  Filled 2022-02-10: qty 125

## 2022-02-10 MED ORDER — AMIODARONE HCL IN DEXTROSE 360-4.14 MG/200ML-% IV SOLN
30.0000 mg/h | INTRAVENOUS | Status: DC
Start: 2022-02-11 — End: 2022-02-15
  Administered 2022-02-11 – 2022-02-15 (×8): 30 mg/h via INTRAVENOUS
  Filled 2022-02-10 (×9): qty 200

## 2022-02-10 MED ORDER — METOPROLOL TARTRATE 5 MG/5ML IV SOLN
5.0000 mg | Freq: Once | INTRAVENOUS | Status: AC
  Administered 2022-02-10: 5 mg via INTRAVENOUS
  Filled 2022-02-10: qty 5

## 2022-02-10 MED ORDER — AMIODARONE HCL IN DEXTROSE 360-4.14 MG/200ML-% IV SOLN
60.0000 mg/h | INTRAVENOUS | Status: AC
Start: 2022-02-10 — End: 2022-02-10
  Administered 2022-02-10 (×2): 60 mg/h via INTRAVENOUS
  Filled 2022-02-10 (×2): qty 200

## 2022-02-10 NOTE — Progress Notes (Signed)
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 Patient states that she has not felt well for the last 3 days and has been weak, fatigued, has had myalgias as well as fever and chills. She admits to hitting her head when she fell but denies any loss of consciousness.  She denied having any neck or back pain. She called EMS and when they arrived she was found to be in A-fib with a rapid ventricular rate with heart rates between 160 and 180. She complains of feeling very sore in her left breast and has had nausea.   Sepsis from left breast cellulitis As evidenced by fever with a Tmax of 102.2, tachycardia, tachypnea, leukocytosis and left breast cellulitis --Patient has a known large seroma in her left breast.  Imaging did not show any evidence of an abscess at this time. --started on vanc and ceftriaxone and then broadened to zosyn, and back to vanc/ceftriaxone Plan: --cont vanc and ceftriaxone for now   stage Ia invasive mammary carcinoma status post lumpectomy and whole breast radiation of her left breast. Known large seroma Continue home Femara --touched base with Dr. Grayland Ormond today, pt will follow up as  outpatient. --touched base with Dr. Christian Mate about the seroma today --[place] of left breast to assess for complexity of seroma fluid   Atrial fibrillation with rapid ventricular rate --no prior hx, likely triggered by sepsis --was started on lovenox for stroke ppx, transitioned to Eliquis --cardiology consulted, with Dr. Nehemiah Massed --rate controlled and converted previously, but this morning back to Afib w RVR Plan: --start cardizem 240 mg daily, per cardio --increase Lopressor to 50 mg BID --start dilt gtt --start amiodarone load plus gtt --cont Eliquis --per cardiology, Continue anticoagulation for further risk reduction and stroke with atrial fibrillation for at least 1 month and then reevaluate significance of recurrence --Echo   Diabetes mellitus, well controlled --SSI for now   Depression Continue Zoloft   Morbid obesity (BMI 41) Complicates overall prognosis and care  Chronic respiratory failure on 2L nightly --Continue supplemental O2 to keep sats >=92%  Chronic pain on chronic opioids --Norco PRN    DVT prophylaxis: WF:UXNATFT Code Status: Full code  Family Communication:  Level of care: Med-Surg Dispo:   The patient is from: home Anticipated d/c is to: home Anticipated d/c date is: >3 days Patient currently is not medically ready to d/c due to: sepsis 2/2 cellulitis on IV abx, Afib w RVR   Subjective and Interval History:  Pt reported good oral intake, and good urine output.  Pt went into afib w RVR again early this morning.  Started on IV dilt gtt.   Objective: Vitals:   02/10/22 1530 02/10/22 1545 02/10/22 1600 02/10/22 1615  BP: 111/86 127/82 (!) 116/100 109/77  Pulse: (!) 153 (!) 138 (!) 174 (!) 161  Resp:    18  Temp:    99 F (37.2 C)  TempSrc:    Oral  SpO2:    94%  Weight:      Height:        Intake/Output Summary (Last 24 hours) at 02/10/2022 1656 Last data filed at 02/10/2022 1300 Gross per 24 hour  Intake 1070.25 ml  Output 1425 ml   Net -354.75 ml   Filed Weights   02/08/22 0536 02/08/22 1441  Weight: 108.4 kg 108.4 kg    Examination:   Constitutional: NAD, AAOx3 HEENT: conjunctivae and lids normal, EOMI CV: No cyanosis.   RESP: normal respiratory effort, on 2L Extremities: No effusions, edema in BLE SKIN: warm, dry, erythema and warmth over left lateral breast Neuro: II - XII grossly intact.   Psych: Normal mood and affect.  Appropriate judgement and reason   Data Reviewed: I have personally reviewed following labs and imaging studies  CBC: Recent Labs  Lab 02/08/22 0542 02/09/22 0440 02/10/22 0502  WBC 13.7* 8.9 6.2  NEUTROABS 12.0*  --   --   HGB 11.0* 8.8* 8.1*  HCT 35.0* 28.6* 26.0*  MCV 88.8 89.7 89.0  PLT 173 158 993   Basic Metabolic Panel: Recent Labs  Lab 02/08/22 0542 02/08/22 0935 02/09/22 0440 02/10/22 0502  NA 136  --  133* 135  K 3.5  --  3.5 3.9  CL 97*  --  99 101  CO2 28  --  27 27  GLUCOSE 200*  --  175* 179*  BUN 25*  --  20 15  CREATININE 0.81  --  0.59 0.62  CALCIUM 9.5  --  8.4* 8.1*  MG  --  1.8  --  1.8   GFR: Estimated Creatinine Clearance: 77.6 mL/min (by C-G formula based on SCr of 0.62 mg/dL). Liver Function Tests: Recent Labs  Lab 02/08/22 0542  AST 18  ALT 14  ALKPHOS 63  BILITOT 1.1  PROT 7.7  ALBUMIN 3.8   No results for input(s): LIPASE, AMYLASE in the last 168 hours. No results for input(s): AMMONIA in the last 168 hours. Coagulation Profile: Recent Labs  Lab 02/08/22 0542 02/09/22 0440  INR 1.2 1.2   Cardiac Enzymes: No results for input(s): CKTOTAL, CKMB, CKMBINDEX, TROPONINI in the last 168 hours. BNP (last 3 results) No results for input(s): PROBNP in the last 8760 hours. HbA1C: Recent Labs    02/08/22 1444  HGBA1C 6.1*   CBG: Recent Labs  Lab 02/09/22 1624 02/09/22 2030 02/10/22 0751 02/10/22 1140 02/10/22 1636  GLUCAP 165* 180* 168* 167* 174*   Lipid Profile: No results for input(s): CHOL, HDL, LDLCALC, TRIG,  CHOLHDL, LDLDIRECT in the last 72 hours. Thyroid Function Tests: Recent Labs    02/08/22 0542  TSH 0.744   Anemia Panel: No results for input(s): VITAMINB12, FOLATE, FERRITIN, TIBC, IRON, RETICCTPCT in the last 72 hours. Sepsis Labs: Recent Labs  Lab 02/08/22 0542 02/09/22 0440  PROCALCITON 0.46 0.30  LATICACIDVEN 1.7  --     Recent Results (from the past 240 hour(s))  Resp Panel by RT-PCR (Flu A&B, Covid) Nasopharyngeal Swab     Status: None   Collection Time: 02/08/22  5:42 AM   Specimen: Nasopharyngeal Swab; Nasopharyngeal(NP) swabs in vial transport medium  Result Value Ref Range Status   SARS Coronavirus 2 by RT PCR NEGATIVE NEGATIVE Final    Comment: (  NOTE) SARS-CoV-2 target nucleic acids are NOT DETECTED.  The SARS-CoV-2 RNA is generally detectable in upper respiratory specimens during the acute phase of infection. The lowest concentration of SARS-CoV-2 viral copies this assay can detect is 138 copies/mL. A negative result does not preclude SARS-Cov-2 infection and should not be used as the sole basis for treatment or other patient management decisions. A negative result may occur with  improper specimen collection/handling, submission of specimen other than nasopharyngeal swab, presence of viral mutation(s) within the areas targeted by this assay, and inadequate number of viral copies(<138 copies/mL). A negative result must be combined with clinical observations, patient history, and epidemiological information. The expected result is Negative.  Fact Sheet for Patients:  EntrepreneurPulse.com.au  Fact Sheet for Healthcare Providers:  IncredibleEmployment.be  This test is no t yet approved or cleared by the [place] FDA and  has been authorized for detection and/or diagnosis of SARS-CoV-2 by FDA under an Emergency Use Authorization (EUA). This EUA will remain  in effect (meaning this test can be used) for the duration of  the COVID-19 declaration under Section 564(b)(1) of the Act, 21 U.S.C.section 360bbb-3(b)(1), unless the authorization is terminated  or revoked sooner.       Influenza A by PCR NEGATIVE NEGATIVE Final   Influenza B by PCR NEGATIVE NEGATIVE Final    Comment: (NOTE) The Xpert Xpress SARS-CoV-2/FLU/RSV plus assay is intended as an aid in the diagnosis of influenza from Nasopharyngeal swab specimens and should not be used as a sole basis for treatment. Nasal washings and aspirates are unacceptable for Xpert Xpress SARS-CoV-2/FLU/RSV testing.  Fact Sheet for Patients: EntrepreneurPulse.com.au  Fact Sheet for Healthcare Providers: IncredibleEmployment.be  This test is not yet approved or cleared by the [place] FDA and has been authorized for detection and/or diagnosis of SARS-CoV-2 by FDA under an Emergency Use Authorization (EUA). This EUA will remain in effect (meaning this test can be used) for the duration of the COVID-19 declaration under Section 564(b)(1) of the Act, 21 U.S.C. section 360bbb-3(b)(1), unless the authorization is terminated or revoked.  Performed at [facility], [address] [place], [place] [address]   Blood Culture (routine x 2)     Status: None (Preliminary result)   Collection Time: 02/08/22  5:42 AM   Specimen: BLOOD RIGHT FOREARM  Result Value Ref Range Status   Specimen Description BLOOD RIGHT FOREARM  Final   Special Requests   Final    BOTTLES DRAWN AEROBIC AND ANAEROBIC Blood Culture adequate volume   Culture   Final    NO GROWTH 2 DAYS Performed at [facility], [address], [place], [place] [address]    Report Status PENDING  Incomplete  Blood Culture (routine x 2)     Status: Abnormal (Preliminary result)   Collection Time: 02/08/22  5:42 AM   Specimen: BLOOD  Result Value Ref Range Status   Specimen Description   Final    BLOOD RIGHT ANTECUBITAL Performed at [facility], [place] [address], [place], [place] [address]    Special Requests   Final    BOTTLES DRAWN AEROBIC AND ANAEROBIC Blood Culture adequate volume Performed at [place] [place],  [address]    Culture  Setup Time   Final    GRAM POSITIVE COCCI AEROBIC BOTTLE ONLY CRITICAL RESULT CALLED TO, READ BACK BY AND VERIFIED WITH: JASON BELUE PHARMD AT 0330 02/09/2022 GA    Culture (A)  Final  STAPHYLOCOCCUS HOMINIS THE SIGNIFICANCE OF ISOLATING THIS ORGANISM FROM A SINGLE SET OF BLOOD CULTURES WHEN MULTIPLE SETS ARE DRAWN IS UNCERTAIN. PLEASE NOTIFY THE MICROBIOLOGY DEPARTMENT WITHIN ONE WEEK IF SPECIATION AND SENSITIVITIES ARE REQUIRED. Performed at [facility], [place] [address] [place], [place] [address]    Report Status PENDING  Incomplete  Urine Culture     Status: None   Collection Time: 02/08/22  5:42 AM   Specimen: In/Out Cath Urine  Result Value Ref Range Status   Specimen Description   Final    IN/OUT CATH URINE Performed at [facility], [address] [place], [place] [address]    Special Requests   Final    NONE Performed at [facility], [address], [place], [place] [address]    Culture   Final    NO GROWTH Performed at [facility], [place] [address], [place], [place] [address]    Report Status 02/09/2022 FINAL  Final  Blood Culture ID Panel (Reflexed)     Status: Abnormal   Collection Time: 02/08/22  5:42 AM  Result Value Ref Range Status   Enterococcus faecalis NOT DETECTED NOT DETECTED Final   Enterococcus Faecium NOT DETECTED NOT DETECTED Final   Listeria monocytogenes NOT DETECTED NOT DETECTED Final   Staphylococcus species DETECTED (A) NOT DETECTED Final    Comment: CRITICAL RESULT CALLED TO, READ BACK BY AND VERIFIED WITH: JASON BELUE PHARMD AT 0330 02/09/2022 GA    Staphylococcus aureus (BCID) NOT DETECTED NOT DETECTED Final   Staphylococcus epidermidis NOT DETECTED NOT DETECTED Final    Staphylococcus lugdunensis NOT DETECTED NOT DETECTED Final   Streptococcus species NOT DETECTED NOT DETECTED Final   Streptococcus agalactiae NOT DETECTED NOT DETECTED Final   Streptococcus pneumoniae NOT DETECTED NOT DETECTED Final   Streptococcus pyogenes NOT DETECTED NOT DETECTED Final   A.calcoaceticus-baumannii NOT DETECTED NOT DETECTED Final   Bacteroides fragilis NOT DETECTED NOT DETECTED Final   Enterobacterales NOT DETECTED NOT DETECTED Final   Enterobacter cloacae complex NOT DETECTED NOT DETECTED Final   Escherichia coli NOT DETECTED NOT DETECTED Final   Klebsiella aerogenes NOT DETECTED NOT DETECTED Final   Klebsiella oxytoca NOT DETECTED NOT DETECTED Final   Klebsiella pneumoniae NOT DETECTED NOT DETECTED Final   Proteus species NOT DETECTED NOT DETECTED Final   Salmonella species NOT DETECTED NOT DETECTED Final   Serratia marcescens NOT DETECTED NOT DETECTED Final   Haemophilus influenzae NOT DETECTED NOT DETECTED Final   Neisseria meningitidis NOT DETECTED NOT DETECTED Final   Pseudomonas aeruginosa NOT DETECTED NOT DETECTED Final   Stenotrophomonas maltophilia NOT DETECTED NOT DETECTED Final   Candida albicans NOT DETECTED NOT DETECTED Final   Candida auris NOT DETECTED NOT DETECTED Final   Candida glabrata NOT DETECTED NOT DETECTED Final   Candida krusei NOT DETECTED NOT DETECTED Final   Candida parapsilosis NOT DETECTED NOT DETECTED Final   Candida tropicalis NOT DETECTED NOT DETECTED Final   Cryptococcus neoformans/gattii NOT DETECTED NOT DETECTED Final    Comment: Performed at [facility], [address] [place],  [address]  MRSA Next Gen by PCR, Nasal     Status: None   Collection Time: 02/08/22  2:35 PM   Specimen: Nasal Mucosa; Nasal Swab  Result Value Ref Range Status   MRSA by PCR Next Gen NOT DETECTED NOT DETECTED Final    Comment: (NOTE) The GeneXpert MRSA Assay (FDA approved for NASAL specimens only), is one component of a  comprehensive MRSA  colonization surveillance program. It is not intended to diagnose MRSA infection nor to guide or monitor treatment for MRSA infections. Test performance is not FDA approved in patients less than 28 years old. Performed at [facility], [address], [place], [place] [address]       Radiology Studies: No results found.   Scheduled Meds:  apixaban  5 mg Oral BID   aspirin  81 mg Oral Daily   atorvastatin  80 mg Oral QPM   Chlorhexidine Gluconate Cloth  6 each Topical Daily   diltiazem  240 mg Oral Daily   dorzolamide-timolol  1 drop Both Eyes BID   fesoterodine  4 mg Oral Daily   fluticasone  2 spray Each Nare Daily   insulin aspart  0-15 Units Subcutaneous TID WC   latanoprost  1 drop Both Eyes QHS   letrozole  2.5 mg Oral Daily   loratadine  10 mg Oral Daily   metoprolol tartrate  50 mg Oral BID   multivitamin with minerals  1 tablet Oral Daily   pantoprazole  40 mg Oral Daily   polyvinyl alcohol  1 drop Both Eyes Daily   pregabalin  100 mg Oral TID   sertraline  100 mg Oral Daily   Continuous Infusions:  cefTRIAXone (ROCEPHIN)  IV 2 g (02/09/22 2259)   diltiazem (CARDIZEM) infusion 15 mg/hr (02/10/22 1423)   vancomycin 1,250 mg (02/10/22 0949)     LOS: 2 days     Enzo Bi, MD [facility] If 7PM-7AM, please contact night-coverage 02/10/2022, 4:56 PM

## 2022-02-10 NOTE — Progress Notes (Signed)
*  PRELIMINARY RESULTS* Echocardiogram 2D Echocardiogram has been performed.  Rhonda Thornton 02/10/2022, 8:37 AM

## 2022-02-10 NOTE — Progress Notes (Signed)
Patient noticed to be in a-fib HR 150's this morning at shift change. MD Billie Ruddy notified who later notified Dr Nehemiah Massed. Patient has been in a-fib rhythm for this nurses entire shift with averaging 150 or greater despite p.o. medications, IV push meds, and initiation of diltiazem gtt with multiple contacts to attending and cardiologist. RN consulted Dr. Nehemiah Massed and Dr. Billie Ruddy for any further possible interventions. Dr Nehemiah Massed ordered amiodarone gtt, amiodarone initiated and patient has been monitored closely throughout shift for changing conditions.

## 2022-02-10 NOTE — Progress Notes (Signed)
Patient with heart rate 130-140's a-fib. MD notified. Orders changed and implemented see MAR.    02/10/22 0749  Assess: MEWS Score  Temp 98.3 F (36.8 C)  BP 115/77  Pulse Rate (!) 146  Resp 20  SpO2 94 %  O2 Device Nasal Cannula  O2 Flow Rate (L/min) 2 L/min  Assess: MEWS Score  MEWS Temp 0  MEWS Systolic 0  MEWS Pulse 3  MEWS RR 0  MEWS LOC 0  MEWS Score 3  MEWS Score Color Yellow  Assess: if the MEWS score is Yellow or Red  Were vital signs taken at a resting state? Yes  Focused Assessment Change from prior assessment (see assessment flowsheet)  Does the patient meet 2 or more of the SIRS criteria? No  MEWS guidelines implemented *See Row Information* Yes  Treat  MEWS Interventions Administered scheduled meds/treatments;Escalated (See documentation below)  Pain Scale 0-10  Pain Score 0  Breathing 0  Negative Vocalization 0  Facial Expression 0  Body Language 0  Consolability 0  PAINAD Score 0  Take Vital Signs  Increase Vital Sign Frequency  Yellow: Q 2hr X 2 then Q 4hr X 2, if remains yellow, continue Q 4hrs  Escalate  MEWS: Escalate Yellow: discuss with charge nurse/RN and consider discussing with provider and RRT  Notify: Charge Nurse/RN  Name of Charge Nurse/RN Notified Janett Billow  Date Charge Nurse/RN Notified 02/10/22  Time Charge Nurse/RN Notified 0800  Notify: Provider  Provider Name/Title Billie Ruddy /Attending  Date Provider Notified 02/10/22  Time Provider Notified 0800  Notification Type Page  Notification Reason Change in status  Provider response See new orders  Date of Provider Response 02/10/22  Time of Provider Response  (see orders)  Document  Progress note created (see row info) Yes  Assess: SIRS CRITERIA  SIRS Temperature  0  SIRS Pulse 1  SIRS Respirations  0  SIRS WBC 0  SIRS Score Sum  1

## 2022-02-10 NOTE — Progress Notes (Addendum)
[facility] Encounter Note  Patient: Rhonda Thornton / Admit Date: 02/08/2022 / Date of Encounter: 02/10/2022, 8:22 AM   Subjective: Patient breathing slightly improved overnight with less need for oxygenation although has chronic obstructive pulmonary disease requiring long-term oxygen.  Telemetry showing normal sinus rhythm and no evidence of significant cardiovascular symptoms at this time.  Hypertension diabetes and hyperlipidemia continuing to be treated appropriately.  Patient has had no reversion back to atrial fibrillation with rapid ventricular rate without evidence of significant symptoms.  Will need further evaluation and treatment options  Review of Systems: Positive for: Shortness of breath Negative for: Vision change, hearing change, syncope, dizziness, nausea, vomiting,diarrhea, bloody stool, stomach pain, cough, congestion, diaphoresis, urinary frequency, urinary pain,skin lesions, skin rashes Others previously listed  Objective: Telemetry: Normal sinus rhythm Physical Exam: Blood pressure 115/77, pulse (!) 146, temperature 98.3 F (36.8 C), resp. rate 20, height 5\' 4"  (1.626 m), weight 108.4 kg, SpO2 94 %. Body mass index is 41.02 kg/m. General: Well developed, well nourished, in no acute distress. Head: Normocephalic, atraumatic, sclera non-icteric, no xanthomas, nares are without discharge. Neck: No apparent masses Lungs: Normal respirations with some wheezes, few rhonchi, no rales , no crackles   Heart: Regular rate and rhythm, normal S1 S2, no murmur, no rub, no gallop, PMI is normal size and placement, carotid upstroke normal without bruit, jugular venous pressure normal Abdomen: Soft, non-tender, non-distended with normoactive bowel sounds. No hepatosplenomegaly. Abdominal aorta is normal size without bruit Extremities: No edema, no clubbing, no cyanosis, no ulcers,  Peripheral: 2+ radial, 2+ femoral, 2+ dorsal pedal pulses Neuro: Alert and  oriented. Moves all extremities spontaneously. Psych:  Responds to questions appropriately with a normal affect.   Intake/Output Summary (Last 24 hours) at 02/10/2022 1856 Last data filed at 02/10/2022 0200 Gross per 24 hour  Intake 925.28 ml  Output 1275 ml  Net -349.72 ml    Inpatient Medications:   apixaban  5 mg Oral BID   aspirin  81 mg Oral Daily   atorvastatin  80 mg Oral QPM   Chlorhexidine Gluconate Cloth  6 each Topical Daily   dorzolamide-timolol  1 drop Both Eyes BID   fesoterodine  4 mg Oral Daily   fluticasone  2 spray Each Nare Daily   insulin aspart  0-15 Units Subcutaneous TID WC   latanoprost  1 drop Both Eyes QHS   letrozole  2.5 mg Oral Daily   loratadine  10 mg Oral Daily   metoprolol tartrate  50 mg Oral BID   multivitamin with minerals  1 tablet Oral Daily   pantoprazole  40 mg Oral Daily   polyvinyl alcohol  1 drop Both Eyes Daily   pregabalin  100 mg Oral TID   sertraline  100 mg Oral Daily   Infusions:   cefTRIAXone (ROCEPHIN)  IV 2 g (02/09/22 2259)   vancomycin Stopped (02/09/22 1119)    Labs: Recent Labs    02/08/22 0935 02/09/22 0440 02/10/22 0502  NA  --  133* 135  K  --  3.5 3.9  CL  --  99 101  CO2  --  27 27  GLUCOSE  --  175* 179*  BUN  --  20 15  CREATININE  --  0.59 0.62  CALCIUM  --  8.4* 8.1*  MG 1.8  --  1.8   Recent Labs    02/08/22 0542  AST 18  ALT 14  ALKPHOS 63  BILITOT 1.1  PROT  7.7  ALBUMIN 3.8   Recent Labs    02/08/22 0542 02/09/22 0440 02/10/22 0502  WBC 13.7* 8.9 6.2  NEUTROABS 12.0*  --   --   HGB 11.0* 8.8* 8.1*  HCT 35.0* 28.6* 26.0*  MCV 88.8 89.7 89.0  PLT 173 158 151   No results for input(s): CKTOTAL, CKMB, TROPONINI in the last 72 hours. Invalid input(s): POCBNP Recent Labs    02/08/22 1444  HGBA1C 6.1*     Weights: Filed Weights   02/08/22 0536 02/08/22 1441  Weight: 108.4 kg 108.4 kg     Radiology/Studies:  DG Knee 2 Views Right  Result Date: 02/08/2022 CLINICAL DATA:   71 year old female status post fall with weakness and dizziness. EXAM: RIGHT KNEE - 1-2 VIEW COMPARISON:  None. FINDINGS: Bone mineralization is within normal limits. Tricompartmental joint space loss and degenerative spurring, moderate to severe in both the medial and patellofemoral compartments. On cross-table lateral view no joint effusion identified. Patella intact. No acute osseous abnormality identified. No discrete soft tissue injury. IMPRESSION: No acute fracture or dislocation identified about the right knee. Advanced tricompartmental degeneration. Electronically Signed   By: Genevie Ann M.D.   On: 02/08/2022 06:44   CT HEAD WO CONTRAST (5MM)  Result Date: 02/08/2022 CLINICAL DATA:  71 year old female status post fall with weakness and dizziness. EXAM: CT HEAD WITHOUT CONTRAST TECHNIQUE: Contiguous axial images were obtained from the base of the skull through the vertex without intravenous contrast. RADIATION DOSE REDUCTION: This exam was performed according to the departmental dose-optimization program which includes automated exposure control, adjustment of the mA and/or kV according to patient size and/or use of iterative reconstruction technique. COMPARISON:  Report of head CT 11/24/1997 (no images available). FINDINGS: Brain: Cerebral volume is within normal limits for age. Bulky dural calcifications. No midline shift, ventriculomegaly, mass effect, evidence of mass lesion, intracranial hemorrhage or evidence of cortically based acute infarction. Gray-white matter differentiation is within normal limits throughout the brain. Vascular: Calcified atherosclerosis at the skull base. No suspicious intracranial vascular hyperdensity. Skull: No acute osseous abnormality identified. Hyperostosis frontalis. Sinuses/Orbits: Evidence of prior ethmoidectomy with asymmetric bilateral ethmoid and frontoethmoidal mucoperiosteal thickening, probable anterior left ethmoid mucocele on series 3, image 19, 17 mm long  axis. Mild nasal septal nodularity. Frontal sinuses, sphenoid and maxillary sinuses remain well aerated. Tympanic cavities are clear. Left mastoids are clear. There is trace right mastoid fluid. Other: Negative visible nasopharynx. No orbit or scalp soft tissue injury identified. IMPRESSION: 1. No acute traumatic injury identified. 2. Normal for age non contrast CT appearance of the brain. 3. Chronic appearing ethmoid sinus disease with possible prior ethmoid sinus surgery. 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 4. Trace right mastoid fluid, likely postinflammatory and significance doubtful. Electronically Signed   By: Genevie Ann M.D.   On: 02/08/2022 07:02   CT Chest W Contrast  Result Date: 02/08/2022 CLINICAL DATA:  71 year old female status post fall with weakness and dizziness. Sepsis. ?Breast cancer status post radiation?Marland Kitchen EXAM: CT CHEST WITH CONTRAST TECHNIQUE: Multidetector CT imaging of the chest was performed during intravenous contrast administration. RADIATION DOSE REDUCTION: This exam was performed according to the departmental dose-optimization program which includes automated exposure control, adjustment of the mA and/or kV according to patient size and/or use of iterative reconstruction technique. CONTRAST:  44mL OMNIPAQUE IOHEXOL 300 MG/ML  SOLN COMPARISON:  Chest CT 08/23/2017. Cervical spine CT today. FINDINGS: Cardiovascular: Calcified aortic atherosclerosis. Mild central pulmonary artery enlargement is chronic. Cardiac size at the upper limits  of normal. No pericardial effusion. Mediastinum/Nodes: Small mediastinal lymph nodes have not significantly changed since 2018 and are likely postinflammatory. Lungs/Pleura: Major airways are patent. New subpleural and patchy peribronchial opacity in the anterior left upper lobe and lingula is likely post radiation related in this setting (series 3, image 61, see left chest wall findings below). Elsewhere subpleural scarring is chronic but progressed  since 2018, especially in the lateral segment of the right middle lobe with associated bronchiectasis there. No pleural effusion. No other confluent pulmonary opacity. No suspicious pulmonary nodule. Small 4 mm left costophrenic angle nodule is stable since 2018 and benign. Upper Abdomen: Chronically absent gallbladder. Negative visible liver, spleen, adrenal glands, kidneys, and bowel in the upper abdomen. Fatty atrophied pancreas. Musculoskeletal: Osteopenia. No acute rib fracture identified. Sternum intact. Advanced disc degeneration in the lower thoracic and visible upper lumbar spine. Chronic flowing thoracic endplate osteophytes and interbody ankylosis. Other findings: Partially visible swollen left breast with extensive inflammatory stranding and large roughly 11 cm diameter encapsulated fluid collection (series 2, image 43) with simple fluid density. Left axillary surgical clips. No axillary lymphadenopathy. IMPRESSION: 1. Partially visible swollen left breast with large, roughly 11 cm diameter encapsulated fluid collection. This may be a postoperative seroma. And extensive surrounding chest wall inflammation, with similar inflammatory changes in the underlying anterior left lung, is likely sequelae of XRT. Left axillary surgical clips. 2. No superimposed acute traumatic injury identified in the chest. 3. No definite thoracic metastatic disease; small mediastinal lymph nodes have not significantly changed since 2018 and are likely postinflammatory. 4. Aortic Atherosclerosis (ICD10-I70.0). Electronically Signed   By: Genevie Ann M.D.   On: 02/08/2022 07:14   CT Cervical Spine Wo Contrast  Result Date: 02/08/2022 CLINICAL DATA:  71 year old female status post fall with weakness and dizziness. EXAM: CT CERVICAL SPINE WITHOUT CONTRAST TECHNIQUE: Multidetector CT imaging of the cervical spine was performed without intravenous contrast. Multiplanar CT image reconstructions were also generated. RADIATION DOSE  REDUCTION: This exam was performed according to the departmental dose-optimization program which includes automated exposure control, adjustment of the mA and/or kV according to patient size and/or use of iterative reconstruction technique. COMPARISON:  Head and chest CT today reported separately. FINDINGS: Alignment: Mild straightening of cervical lordosis. Cervicothoracic junction alignment is within normal limits. Bilateral posterior element alignment is within normal limits. Skull base and vertebrae: Visualized skull base is intact. No atlanto-occipital dissociation. C1 and C2 appear intact and aligned. Mild motion artifact C3 and C4. No acute osseous abnormality identified; corticated ossific fragment at chronically hypertrophied anterior left C2-C3 facets (sagittal image 77). And questionable nondisplaced linear lucency through the right C7 facet on series 6, image 77 is not correlated on the other imaging planes and appears to be artifact. Soft tissues and spinal canal: No prevertebral fluid or swelling. No visible canal hematoma. Negative visible noncontrast neck soft tissues aside from left carotid bifurcation atherosclerosis. Disc levels: Upper cervical facet arthropathy maximal on the left C2-C3. Lower cervical disc and endplate degeneration. Mild if any associated spinal stenosis at C5-C6. Upper chest: Chest CT reported separately. Other: Right mandible motion artifact. Carious dentition with bulky periapical lucency left maxillary molar, right maxillary bicuspid. IMPRESSION: 1. No acute traumatic injury identified in the cervical spine. 2. Cervical spine degeneration with possible mild spinal stenosis at C5-C6. 3. Poor dentition. Electronically Signed   By: Genevie Ann M.D.   On: 02/08/2022 07:07   CT KNEE LEFT WO CONTRAST  Result Date: 01/25/2022 CLINICAL DATA:  Chronic  knee pain for the past 3 weeks EXAM: CT OF THE LEFT KNEE WITHOUT CONTRAST TECHNIQUE: Multidetector CT imaging of the LEFT knee was  performed according to the standard protocol. Multiplanar CT image reconstructions were also generated. RADIATION DOSE REDUCTION: This exam was performed according to the departmental dose-optimization program which includes automated exposure control, adjustment of the mA and/or kV according to patient size and/or use of iterative reconstruction technique. COMPARISON:  None. FINDINGS: Bones/Joint/Cartilage Hip demonstrates no fracture or dislocation. No lytic or blastic lesion. Moderate osteoarthritis of the left SI joint. Knee demonstrates no fracture or dislocation. No lytic or blastic lesion. Severe osteoarthritis of the medial femorotibial compartment with marginal osteophytes. Mild lateral femorotibial compartment with small marginal osteophytes. Moderate-severe osteoarthritis of the patellofemoral compartment with marginal osteophytes. 11 mm loose body along the anterior aspect of the ACL. Severe osteoarthritis of the proximal tibia-fibular joint. Small joint effusion. Ankle demonstrates no fracture or dislocation. No lytic or blastic lesion. Moderate osteoarthritis of the tibiotalar joint. Mild osteoarthritis of the talonavicular joint. Mild osteoarthritis of the posterior subtalar joint. Ligaments Ligaments are suboptimally evaluated by CT. Muscles and Tendons Muscles are normal. Quadriceps tendon and patellar tendon are intact. Flexor, extensor, peroneal and Achilles tendons are grossly intact. Soft tissue No fluid collection or hematoma.  No soft tissue mass. IMPRESSION: 1. Tricompartmental osteoarthritis of the left knee, most severe in the medial femorotibial compartment. Electronically Signed   By: Kathreen Devoid M.D.   On: 01/25/2022 10:54   DG Chest Port 1 View  Result Date: 02/08/2022 CLINICAL DATA:  71 year old female status post fall with weakness and dizziness. EXAM: PORTABLE CHEST 1 VIEW COMPARISON:  CTA chest 08/23/2017 and earlier. FINDINGS: Portable AP upright view at 0610 hours. Lower lung  volumes. Allowing for this cardiac and mediastinal contours remain within normal limits. The patient is mildly rotated to the left. Visualized tracheal air column is within normal limits. Allowing for portable technique the lungs are clear. No pneumothorax or pleural effusion. Paucity of bowel gas in the upper abdomen. No acute osseous abnormality identified. IMPRESSION: Low lung volumes. No acute cardiopulmonary abnormality or acute traumatic injury identified. Electronically Signed   By: Genevie Ann M.D.   On: 02/08/2022 06:43     Assessment and Recommendation  71 y.o. female with known chronic obstructive pulmonary disease on oxygen with diabetes hypertension hyperlipidemia mild aortic valve stenosis having some acute on chronic obstructive pulmonary disease and atrial fibrillation with rapid ventricular rate now spontaneously converted to normal sinus rhythm without evidence of congestive heart failure or angina 1.  Continue supportive care pulmonary issues including COPD and exacerbation with antibiotics and other care 2.  High intensity cholesterol therapy hypertension control and diabetes control for further risk reduction in cardiovascular event 3.  No further cardiac diagnostics necessary at this time 4.  Further adjustments of medication management including increasing metoprolol dose as well as using diltiazem for heart rate control and possible spontaneous conversion to normal sinus rhythm 5.  Eliquis 5 mg twice per day for further risk reduction and stroke with atrial fibrillation for at least 1 month as outpatient and then further adjustments in treatment options as outpatient  Signed, Serafina Royals M.D. FACC

## 2022-02-10 NOTE — Consult Note (Signed)
Pharmacy Antibiotic Note  Rhonda Thornton is a 71 y.o. female admitted on 02/08/2022 with sepsis secondary to left breast cellulitis/large known seroma in left breast. 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 Pharmacy has been consulted for vancomycin and ceftriaxone dosing.   Patient presented septic, now improving with HR 155>>160s, Tmax 102.2>>98.9, and WBC 13.7>>6.2. Chest CT from 2/18 shows "partially visible swollen left breast with encapsulated fluid collection. This may be a postoperative seroma. Chest wall inflammation likely non-infectious and due to sequelae of XRT."   Day 3 of antibiotics  Patient presented with Scr c/w baseline, Scr <0.8.   Plan:  Ceftriaxone 2 g q24h  Vancomycin 1250 mg IV q24h --Calculcated AUC: 455, Cmin 10.5 --Daily Scr per protocol --Levels at steady state as clinically indicated  Height: 5\' 4"  (162.6 cm) Weight: 108.4 kg (239 lb) IBW/kg (Calculated) : 54.7  Temp (24hrs), Avg:98.3 F (36.8 C), Min:98 F (36.7 C), Max:98.5 F (36.9 C)  Recent Labs  Lab 02/08/22 0542 02/09/22 0440 02/10/22 0502  WBC 13.7* 8.9 6.2  CREATININE 0.81 0.59 0.62  LATICACIDVEN 1.7  --   --      Estimated Creatinine Clearance: 77.6 mL/min (by C-G formula based on SCr of 0.62 mg/dL).    No Known Allergies  Antimicrobials this admission: Ceftriaxone 2/18 >> Vancomycin 2/18 >>  Zosyn 2/18 >> 2/19  Dose adjustments this admission: N/A  Microbiology results: 2/18 BCx: 1/4 Staphylococcus species 2/18 UCx: NG Final  2/18 MRSA PCR: (-)  Thank you for allowing pharmacy to be a part of this patients care.  Owens Loffler 02/10/2022 11:32 AM

## 2022-02-10 NOTE — Progress Notes (Signed)
PT Cancellation Note  Patient Details Name: Rhonda Thornton MRN: [id number] DOB: 1951-06-14   Cancelled Treatment:    Reason Eval/Treat Not Completed: Medical issues which prohibited therapy. Patient with tachycardia this am 140-150s. Will hold PT eval at this time and continue to monitor. Evaluate when medically appropriate.    Raney Koeppen 02/10/2022, 11:08 AM

## 2022-02-11 ENCOUNTER — Inpatient Hospital Stay: Payer: Medicare HMO

## 2022-02-11 DIAGNOSIS — N6489 Other specified disorders of breast: Secondary | ICD-10-CM | POA: Diagnosis not present

## 2022-02-11 DIAGNOSIS — N61 Mastitis without abscess: Secondary | ICD-10-CM | POA: Diagnosis not present

## 2022-02-11 DIAGNOSIS — A419 Sepsis, unspecified organism: Secondary | ICD-10-CM | POA: Diagnosis not present

## 2022-02-11 LAB — CULTURE, BLOOD (ROUTINE X 2): Special Requests: ADEQUATE

## 2022-02-11 LAB — BASIC METABOLIC PANEL
Anion gap: 10 (ref 5–15)
BUN: 15 mg/dL (ref 8–23)
CO2: 27 mmol/L (ref 22–32)
Calcium: 8.4 mg/dL — ABNORMAL LOW (ref 8.9–10.3)
Chloride: 99 mmol/L (ref 98–111)
Creatinine, Ser: 0.45 mg/dL (ref 0.44–1.00)
GFR, Estimated: >60 mL/min (ref >60)
Glucose, Bld: 188 mg/dL — ABNORMAL HIGH (ref 70–99)
Potassium: 3.8 mmol/L (ref 3.5–5.1)
Sodium: 136 mmol/L (ref 135–145)

## 2022-02-11 LAB — CBC
HCT: 29.3 % — ABNORMAL LOW (ref 36.0–46.0)
Hemoglobin: 9.1 g/dL — ABNORMAL LOW (ref 12.0–15.0)
MCH: 27.3 pg (ref 26.0–34.0)
MCHC: 31.1 g/dL (ref 30.0–36.0)
MCV: 88 fL (ref 80.0–100.0)
Platelets: 186 10*3/uL (ref 150–400)
RBC: 3.33 MIL/uL — ABNORMAL LOW (ref 3.87–5.11)
RDW: 15.1 % (ref 11.5–15.5)
WBC: 6.9 10*3/uL (ref 4.0–10.5)
nRBC: 0 % (ref 0.0–0.2)

## 2022-02-11 LAB — GLUCOSE, CAPILLARY
Glucose-Capillary: 171 mg/dL — ABNORMAL HIGH (ref 70–99)
Glucose-Capillary: 198 mg/dL — ABNORMAL HIGH (ref 70–99)
Glucose-Capillary: 174 mg/dL — ABNORMAL HIGH (ref 70–99)
Glucose-Capillary: 159 mg/dL — ABNORMAL HIGH (ref 70–99)

## 2022-02-11 LAB — ECHOCARDIOGRAM COMPLETE
AR max vel: 0.61 cm2
AV Area VTI: 0.61 cm2
AV Area mean vel: 0.61 cm2
AV Mean grad: 17.3 mmHg
AV Peak grad: 29.7 mmHg
Ao pk vel: 2.72 m/s
Area-P 1/2: 5.23 cm2
Height: 64 in
S' Lateral: 2.2 cm
Weight: 3824.01 oz

## 2022-02-11 LAB — MAGNESIUM: Magnesium: 1.8 mg/dL (ref 1.7–2.4)

## 2022-02-11 MED ORDER — POLYETHYLENE GLYCOL 3350 17 G PO PACK
34.0000 g | PACK | ORAL | Status: AC
  Administered 2022-02-11 – 2022-02-12 (×5): 34 g via ORAL
  Filled 2022-02-11 (×5): qty 2

## 2022-02-11 MED ORDER — CEFAZOLIN SODIUM-DEXTROSE 2-4 GM/100ML-% IV SOLN
2.0000 g | Freq: Three times a day (TID) | INTRAVENOUS | Status: DC
  Administered 2022-02-12 – 2022-02-16 (×14): 2 g via INTRAVENOUS
  Filled 2022-02-11 (×16): qty 100

## 2022-02-11 MED ORDER — CEFAZOLIN SODIUM-DEXTROSE 1-4 GM/50ML-% IV SOLN
1.0000 g | Freq: Two times a day (BID) | INTRAVENOUS | Status: DC
  Filled 2022-02-11: qty 50

## 2022-02-11 NOTE — Procedures (Signed)
Interventional Radiology Procedure Note   Date of Procedure: 02/11/2022  Procedure: [place] left breat fluid drainage     Findings:  1. [place] left breast fluid drainage with 125 ml dark red/brown fluid removed, sample sent to lab for analysis    Complications: No immediate complications noted.    Estimated Blood Loss: minimal   Follow-up and Recommendations: 1. F/U microbiology studies      Albin Felling, MD  Vascular & Interventional Radiology  02/11/2022 3:07 PM

## 2022-02-11 NOTE — Progress Notes (Signed)
PT Cancellation Note  Patient Details Name: Rhonda Thornton MRN: [id number] DOB: [birth year]   Cancelled Treatment:    Reason Eval/Treat Not Completed: Medical issues which prohibited therapy. Pt with elevated HR at rest (140s). PT to attempt when patient is medically appropriate.   Lieutenant Diego PT, DPT 9:06 AM,02/11/22

## 2022-02-11 NOTE — Progress Notes (Signed)
[facility] Encounter Note  Patient: Rhonda Thornton / Admit Date: 02/08/2022 / Date of Encounter: 02/11/2022, 2:01 PM   Subjective: Patient breathing slightly improved overnight with less need for oxygenation although has chronic obstructive pulmonary disease requiring long-term oxygen.  Telemetry showing normal sinus rhythm and no evidence of significant cardiovascular symptoms at this time.  Hypertension diabetes and hyperlipidemia continuing to be treated appropriately.  2/21.  The patient has had now recurrence of atrial fibrillation with rapid ventricular rate and now on maximal medication management for heart rate control as well as for possible spontaneous conversion to normal rhythm.  In addition to current diltiazem and metoprolol the patient was started on amiodarone drip for possible spontaneous conversion.  Currently she is tolerating well but has continued issues with her breast and seroma review of Systems: Positive for: Shortness of breath Negative for: Vision change, hearing change, syncope, dizziness, nausea, vomiting,diarrhea, bloody stool, stomach pain, cough, congestion, diaphoresis, urinary frequency, urinary pain,skin lesions, skin rashes Others previously listed  Objective: Telemetry: Atrial fibrillation with rapid ventricular rate Physical Exam: Blood pressure (!) 115/93, pulse (!) 145, temperature 99.6 F (37.6 C), temperature source Oral, resp. rate 18, height 5\' 4"  (1.626 m), weight 108.4 kg, SpO2 92 %. Body mass index is 41.02 kg/m. General: Well developed, well nourished, in no acute distress. Head: Normocephalic, atraumatic, sclera non-icteric, no xanthomas, nares are without discharge. Neck: No apparent masses Lungs: Normal respirations with some wheezes, few rhonchi, no rales , no crackles   Heart: Regular rate and rhythm, normal S1 S2, no murmur, no rub, no gallop, PMI is normal size and placement, carotid upstroke normal without bruit,  jugular venous pressure normal Abdomen: Soft, non-tender, non-distended with normoactive bowel sounds. No hepatosplenomegaly. Abdominal aorta is normal size without bruit Extremities: No edema, no clubbing, no cyanosis, no ulcers,  Peripheral: 2+ radial, 2+ femoral, 2+ dorsal pedal pulses Neuro: Alert and oriented. Moves all extremities spontaneously. Psych:  Responds to questions appropriately with a normal affect.   Intake/Output Summary (Last 24 hours) at 02/11/2022 1401 Last data filed at 02/11/2022 0734 Gross per 24 hour  Intake 401.83 ml  Output 600 ml  Net -198.17 ml     Inpatient Medications:   apixaban  5 mg Oral BID   aspirin  81 mg Oral Daily   atorvastatin  80 mg Oral QPM   Chlorhexidine Gluconate Cloth  6 each Topical Daily   diltiazem  240 mg Oral Daily   dorzolamide-timolol  1 drop Both Eyes BID   fesoterodine  4 mg Oral Daily   fluticasone  2 spray Each Nare Daily   insulin aspart  0-15 Units Subcutaneous TID WC   latanoprost  1 drop Both Eyes QHS   letrozole  2.5 mg Oral Daily   loratadine  10 mg Oral Daily   metoprolol tartrate  50 mg Oral BID   multivitamin with minerals  1 tablet Oral Daily   pantoprazole  40 mg Oral Daily   polyvinyl alcohol  1 drop Both Eyes Daily   pregabalin  100 mg Oral TID   sertraline  100 mg Oral Daily   Infusions:   amiodarone 30 mg/hr (02/11/22 0551)   [START ON 02/12/2022]  ceFAZolin (ANCEF) IV      Labs: Recent Labs    02/10/22 0502 02/11/22 0620  NA 135 136  K 3.9 3.8  CL 101 99  CO2 27 27  GLUCOSE 179* 188*  BUN 15 15  CREATININE 0.62 0.45  CALCIUM 8.1* 8.4*  MG 1.8 1.8    No results for input(s): AST, ALT, ALKPHOS, BILITOT, PROT, ALBUMIN in the last 72 hours.  Recent Labs    02/10/22 0502 02/11/22 0620  WBC 6.2 6.9  HGB 8.1* 9.1*  HCT 26.0* 29.3*  MCV 89.0 88.0  PLT 151 186    No results for input(s): CKTOTAL, CKMB, TROPONINI in the last 72 hours. Invalid input(s): POCBNP Recent Labs     02/08/22 1444  HGBA1C 6.1*      Weights: Filed Weights   02/08/22 0536 02/08/22 1441  Weight: 108.4 kg 108.4 kg     Radiology/Studies:  DG Knee 2 Views Right  Result Date: 02/08/2022 CLINICAL DATA:  71 year old female status post fall with weakness and dizziness. EXAM: RIGHT KNEE - 1-2 VIEW COMPARISON:  None. FINDINGS: Bone mineralization is within normal limits. Tricompartmental joint space loss and degenerative spurring, moderate to severe in both the medial and patellofemoral compartments. On cross-table lateral view no joint effusion identified. Patella intact. No acute osseous abnormality identified. No discrete soft tissue injury. IMPRESSION: No acute fracture or dislocation identified about the right knee. Advanced tricompartmental degeneration. Electronically Signed   By: Genevie Ann M.D.   On: 02/08/2022 06:44   CT HEAD WO CONTRAST (5MM)  Result Date: 02/08/2022 CLINICAL DATA:  71 year old female status post fall with weakness and dizziness. EXAM: CT HEAD WITHOUT CONTRAST TECHNIQUE: Contiguous axial images were obtained from the base of the skull through the vertex without intravenous contrast. RADIATION DOSE REDUCTION: This exam was performed according to the departmental dose-optimization program which includes automated exposure control, adjustment of the mA and/or kV according to patient size and/or use of iterative reconstruction technique. COMPARISON:  Report of head CT 11/24/1997 (no images available). FINDINGS: Brain: Cerebral volume is within normal limits for age. Bulky dural calcifications. No midline shift, ventriculomegaly, mass effect, evidence of mass lesion, intracranial hemorrhage or evidence of cortically based acute infarction. Gray-white matter differentiation is within normal limits throughout the brain. Vascular: Calcified atherosclerosis at the skull base. No suspicious intracranial vascular hyperdensity. Skull: No acute osseous abnormality identified. Hyperostosis  frontalis. Sinuses/Orbits: Evidence of prior ethmoidectomy with asymmetric bilateral ethmoid and frontoethmoidal mucoperiosteal thickening, probable anterior left ethmoid mucocele on series 3, image 19, 17 mm long axis. Mild nasal septal nodularity. Frontal sinuses, sphenoid and maxillary sinuses remain well aerated. Tympanic cavities are clear. Left mastoids are clear. There is trace right mastoid fluid. Other: Negative visible nasopharynx. No orbit or scalp soft tissue injury identified. IMPRESSION: 1. No acute traumatic injury identified. 2. Normal for age non contrast CT appearance of the brain. 3. Chronic appearing ethmoid sinus disease with possible prior ethmoid sinus surgery. 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 4. Trace right mastoid fluid, likely postinflammatory and significance doubtful. Electronically Signed   By: Genevie Ann M.D.   On: 02/08/2022 07:02   CT Chest W Contrast  Result Date: 02/08/2022 CLINICAL DATA:  71 year old female status post fall with weakness and dizziness. Sepsis. ?Breast cancer status post radiation?Marland Kitchen EXAM: CT CHEST WITH CONTRAST TECHNIQUE: Multidetector CT imaging of the chest was performed during intravenous contrast administration. RADIATION DOSE REDUCTION: This exam was performed according to the departmental dose-optimization program which includes automated exposure control, adjustment of the mA and/or kV according to patient size and/or use of iterative reconstruction technique. CONTRAST:  45mL OMNIPAQUE IOHEXOL 300 MG/ML  SOLN COMPARISON:  Chest CT 08/23/2017. Cervical spine CT today. FINDINGS: Cardiovascular: Calcified aortic atherosclerosis. Mild central pulmonary artery enlargement is chronic.  Cardiac size at the upper limits of normal. No pericardial effusion. Mediastinum/Nodes: Small mediastinal lymph nodes have not significantly changed since 2018 and are likely postinflammatory. Lungs/Pleura: Major airways are patent. New subpleural and patchy peribronchial  opacity in the anterior left upper lobe and lingula is likely post radiation related in this setting (series 3, image 61, see left chest wall findings below). Elsewhere subpleural scarring is chronic but progressed since 2018, especially in the lateral segment of the right middle lobe with associated bronchiectasis there. No pleural effusion. No other confluent pulmonary opacity. No suspicious pulmonary nodule. Small 4 mm left costophrenic angle nodule is stable since 2018 and benign. Upper Abdomen: Chronically absent gallbladder. Negative visible liver, spleen, adrenal glands, kidneys, and bowel in the upper abdomen. Fatty atrophied pancreas. Musculoskeletal: Osteopenia. No acute rib fracture identified. Sternum intact. Advanced disc degeneration in the lower thoracic and visible upper lumbar spine. Chronic flowing thoracic endplate osteophytes and interbody ankylosis. Other findings: Partially visible swollen left breast with extensive inflammatory stranding and large roughly 11 cm diameter encapsulated fluid collection (series 2, image 43) with simple fluid density. Left axillary surgical clips. No axillary lymphadenopathy. IMPRESSION: 1. Partially visible swollen left breast with large, roughly 11 cm diameter encapsulated fluid collection. This may be a postoperative seroma. And extensive surrounding chest wall inflammation, with similar inflammatory changes in the underlying anterior left lung, is likely sequelae of XRT. Left axillary surgical clips. 2. No superimposed acute traumatic injury identified in the chest. 3. No definite thoracic metastatic disease; small mediastinal lymph nodes have not significantly changed since 2018 and are likely postinflammatory. 4. Aortic Atherosclerosis (ICD10-I70.0). Electronically Signed   By: Genevie Ann M.D.   On: 02/08/2022 07:14   CT Cervical Spine Wo Contrast  Result Date: 02/08/2022 CLINICAL DATA:  71 year old female status post fall with weakness and dizziness. EXAM:  CT CERVICAL SPINE WITHOUT CONTRAST TECHNIQUE: Multidetector CT imaging of the cervical spine was performed without intravenous contrast. Multiplanar CT image reconstructions were also generated. RADIATION DOSE REDUCTION: This exam was performed according to the departmental dose-optimization program which includes automated exposure control, adjustment of the mA and/or kV according to patient size and/or use of iterative reconstruction technique. COMPARISON:  Head and chest CT today reported separately. FINDINGS: Alignment: Mild straightening of cervical lordosis. Cervicothoracic junction alignment is within normal limits. Bilateral posterior element alignment is within normal limits. Skull base and vertebrae: Visualized skull base is intact. No atlanto-occipital dissociation. C1 and C2 appear intact and aligned. Mild motion artifact C3 and C4. No acute osseous abnormality identified; corticated ossific fragment at chronically hypertrophied anterior left C2-C3 facets (sagittal image 77). And questionable nondisplaced linear lucency through the right C7 facet on series 6, image 77 is not correlated on the other imaging planes and appears to be artifact. Soft tissues and spinal canal: No prevertebral fluid or swelling. No visible canal hematoma. Negative visible noncontrast neck soft tissues aside from left carotid bifurcation atherosclerosis. Disc levels: Upper cervical facet arthropathy maximal on the left C2-C3. Lower cervical disc and endplate degeneration. Mild if any associated spinal stenosis at C5-C6. Upper chest: Chest CT reported separately. Other: Right mandible motion artifact. Carious dentition with bulky periapical lucency left maxillary molar, right maxillary bicuspid. IMPRESSION: 1. No acute traumatic injury identified in the cervical spine. 2. Cervical spine degeneration with possible mild spinal stenosis at C5-C6. 3. Poor dentition. Electronically Signed   By: Genevie Ann M.D.   On: 02/08/2022 07:07    CT KNEE LEFT WO CONTRAST  Result Date: 01/25/2022 CLINICAL DATA:  Chronic knee pain for the past 3 weeks EXAM: CT OF THE LEFT KNEE WITHOUT CONTRAST TECHNIQUE: Multidetector CT imaging of the LEFT knee was performed according to the standard protocol. Multiplanar CT image reconstructions were also generated. RADIATION DOSE REDUCTION: This exam was performed according to the departmental dose-optimization program which includes automated exposure control, adjustment of the mA and/or kV according to patient size and/or use of iterative reconstruction technique. COMPARISON:  None. FINDINGS: Bones/Joint/Cartilage Hip demonstrates no fracture or dislocation. No lytic or blastic lesion. Moderate osteoarthritis of the left SI joint. Knee demonstrates no fracture or dislocation. No lytic or blastic lesion. Severe osteoarthritis of the medial femorotibial compartment with marginal osteophytes. Mild lateral femorotibial compartment with small marginal osteophytes. Moderate-severe osteoarthritis of the patellofemoral compartment with marginal osteophytes. 11 mm loose body along the anterior aspect of the ACL. Severe osteoarthritis of the proximal tibia-fibular joint. Small joint effusion. Ankle demonstrates no fracture or dislocation. No lytic or blastic lesion. Moderate osteoarthritis of the tibiotalar joint. Mild osteoarthritis of the talonavicular joint. Mild osteoarthritis of the posterior subtalar joint. Ligaments Ligaments are suboptimally evaluated by CT. Muscles and Tendons Muscles are normal. Quadriceps tendon and patellar tendon are intact. Flexor, extensor, peroneal and Achilles tendons are grossly intact. Soft tissue No fluid collection or hematoma.  No soft tissue mass. IMPRESSION: 1. Tricompartmental osteoarthritis of the left knee, most severe in the medial femorotibial compartment. Electronically Signed   By: Kathreen Devoid M.D.   On: 01/25/2022 10:54   DG Chest Port 1 View  Result Date:  02/08/2022 CLINICAL DATA:  71 year old female status post fall with weakness and dizziness. EXAM: PORTABLE CHEST 1 VIEW COMPARISON:  CTA chest 08/23/2017 and earlier. FINDINGS: Portable AP upright view at 0610 hours. Lower lung volumes. Allowing for this cardiac and mediastinal contours remain within normal limits. The patient is mildly rotated to the left. Visualized tracheal air column is within normal limits. Allowing for portable technique the lungs are clear. No pneumothorax or pleural effusion. Paucity of bowel gas in the upper abdomen. No acute osseous abnormality identified. IMPRESSION: Low lung volumes. No acute cardiopulmonary abnormality or acute traumatic injury identified. Electronically Signed   By: Genevie Ann M.D.   On: 02/08/2022 06:43   US BREAST LTD UNI LEFT INC AXILLA  Result Date: 02/11/2022 CLINICAL DATA:  Patient is status post lumpectomy left breast in 2022 with a known left breast seroma. Patient has been admitted for sepsis. The patient's physician requested ultrasound scanning of the patient's known left breast seroma. EXAM: ULTRASOUND OF THE LEFT BREAST COMPARISON:  December 05, 2021 FINDINGS: Targeted ultrasound is performed, showing stable seroma at the left breast upper-outer quadrant measuring maximum 10.9 cm unchanged compared to prior ultrasound. IMPRESSION: Benign findings. RECOMMENDATION: Bilateral diagnostic mammogram back on schedule. I have discussed the findings and recommendations with the patient. If applicable, a reminder letter will be sent to the patient regarding the next appointment. BI-RADS CATEGORY  2: Benign. Electronically Signed   By: Abelardo Diesel M.D.   On: 02/11/2022 09:52     Assessment and Recommendation  71 y.o. female with known chronic obstructive pulmonary disease on oxygen with diabetes hypertension hyperlipidemia mild aortic valve stenosis having some acute on chronic obstructive pulmonary disease and atrial fibrillation with rapid ventricular rate  with continuation of rapid rate despite maximal medication management.  There is no current evidence of congestive heart failure or myocardial infarction  1.  Continue supportive care pulmonary issues including COPD  and exacerbation with antibiotics and other care and treatment of breast seroma 2.  High intensity cholesterol therapy hypertension control and diabetes control for further risk reduction in cardiovascular event 3.  No further cardiac diagnostics necessary at this time 4.  Continuation of maximal diltiazem metoprolol and amiodarone drip for atrial fibrillation at this time hoping for spontaneous conversion to normal rhythm.  Will consider the possibility of electrical cardioversion if necessary 5.  Eliquis 5 mg twice per day for further risk reduction and stroke with atrial fibrillation for at least 1 month as outpatient and then further adjustments in treatment options as outpatient  Signed, Serafina Royals M.D. FACC

## 2022-02-11 NOTE — Care Management Important Message (Signed)
Important Message  Patient Details  Name: Rhonda Thornton MRN: [id number] Date of Birth: 11/09/1951   Medicare Important Message Given:  Yes     Dannette Barbara 02/11/2022, 11:36 AM

## 2022-02-11 NOTE — Progress Notes (Signed)
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 Patient states that she has not felt well for the last 3 days and has been weak, fatigued, has had myalgias as well as fever and chills. She admits to hitting her head when she fell but denies any loss of consciousness.  She denied having any neck or back pain. She called EMS and when they arrived she was found to be in A-fib with a rapid ventricular rate with heart rates between 160 and 180. She complains of feeling very sore in her left breast and has had nausea.   Sepsis from left breast cellulitis As evidenced by fever with a Tmax of 102.2, tachycardia, tachypnea, leukocytosis and left breast cellulitis --Patient has a known large seroma in her left breast.  Imaging did not show any evidence of an abscess at this time. --started on vanc and ceftriaxone and then broadened to zosyn, and back to vanc/ceftriaxone Plan: --narrow to Ancef, per ID pharm   stage Ia invasive mammary carcinoma status post lumpectomy and whole breast radiation of her left breast. Known large seroma Continue home Femara --touched base with Dr. Grayland Ormond today, pt will follow up as  outpatient. --touched base with Dr. Christian Mate about the seroma, who recommended fluid aspiration. Plan: --US-guided fluid aspiration with IR today, 125 ml dark red/brown fluid removed and sent for culture   Atrial fibrillation with rapid ventricular rate --no prior hx, likely triggered by sepsis --was started on lovenox for stroke ppx, transitioned to Eliquis --cardiology consulted, with Dr. Nehemiah Massed --rate controlled and converted previously, but back to Afib w RVR morning of 2/20 --still not rate controlled today. Plan: --cont cardizem 240 mg daily, per cardio --cont Lopressor to 50 mg BID --cont amiodarone gtt --cont Eliquis --monitor for hypotension --per cardiology, Continue anticoagulation for further risk reduction and stroke with atrial fibrillation for at least 1 month and then reevaluate significance of recurrence  Diabetes mellitus, well controlled --SSI for now   Depression Continue Zoloft   Morbid obesity (BMI 41) Complicates overall prognosis and care  Chronic respiratory failure on 2L nightly --Continue supplemental O2 to keep sats >=92%  Chronic pain on chronic opioids --Norco PRN    DVT prophylaxis: XT:GGYIRSW Code Status: Full code  Family Communication:  Level of care: Med-Surg Dispo:   The patient is from: home Anticipated d/c is to: home Anticipated d/c date is: >3 days Patient currently is not medically ready to d/c due to: sepsis 2/2 cellulitis on IV abx, Afib w RVR uncontrolled   Subjective and Interval History:  Pt received US-guided aspiration of breast seroma today.  Still having mild fever today.  Pt reported no appetite.   Objective: Vitals:   02/11/22 1913 02/11/22  2005 02/11/22 2110 02/11/22 2324  BP: 122/75 (!) 115/96 102/79 113/84  Pulse: (!) 149 (!) 146 (!) 147 (!) 144  Resp: 18 16 18 18   Temp: 99.2 F (37.3 C) 98.3 F (36.8 C) 98.3 F (36.8 C) 98.5 F (36.9 C)  TempSrc:  Oral Oral   SpO2: 96% 94% 94% 95%  Weight:       Height:        Intake/Output Summary (Last 24 hours) at 02/12/2022 0257 Last data filed at 02/11/2022 1828 Gross per 24 hour  Intake 401.83 ml  Output 1200 ml  Net -798.17 ml   Filed Weights   02/08/22 0536 02/08/22 1441  Weight: 108.4 kg 108.4 kg    Examination:   Constitutional: NAD, AAOx3 HEENT: conjunctivae and lids normal, EOMI CV: No cyanosis.   RESP: normal respiratory effort, on RA SKIN: warm, dry.  Erythema of left lateral breast receded  Neuro: II - XII grossly intact.   Psych: Normal mood and affect.  Appropriate judgement and reason   Data Reviewed: I have personally reviewed following labs and imaging studies  CBC: Recent Labs  Lab 02/08/22 0542 02/09/22 0440 02/10/22 0502 02/11/22 0620  WBC 13.7* 8.9 6.2 6.9  NEUTROABS 12.0*  --   --   --   HGB 11.0* 8.8* 8.1* 9.1*  HCT 35.0* 28.6* 26.0* 29.3*  MCV 88.8 89.7 89.0 88.0  PLT 173 158 151 102   Basic Metabolic Panel: Recent Labs  Lab 02/08/22 0542 02/08/22 0935 02/09/22 0440 02/10/22 0502 02/11/22 0620  NA 136  --  133* 135 136  K 3.5  --  3.5 3.9 3.8  CL 97*  --  99 101 99  CO2 28  --  27 27 27   GLUCOSE 200*  --  175* 179* 188*  BUN 25*  --  20 15 15   CREATININE 0.81  --  0.59 0.62 0.45  CALCIUM 9.5  --  8.4* 8.1* 8.4*  MG  --  1.8  --  1.8 1.8   GFR: Estimated Creatinine Clearance: 77.6 mL/min (by C-G formula based on SCr of 0.45 mg/dL). Liver Function Tests: Recent Labs  Lab 02/08/22 0542  AST 18  ALT 14  ALKPHOS 63  BILITOT 1.1  PROT 7.7  ALBUMIN 3.8   No results for input(s): LIPASE, AMYLASE in the last 168 hours. No results for input(s): AMMONIA in the last 168 hours. Coagulation Profile: Recent Labs  Lab 02/08/22 0542 02/09/22 0440  INR 1.2 1.2   Cardiac Enzymes: No results for input(s): CKTOTAL, CKMB, CKMBINDEX, TROPONINI in the last 168 hours. BNP (last 3 results) No results for input(s): PROBNP in the last 8760 hours. HbA1C: No results for input(s): HGBA1C in  the last 72 hours.  CBG: Recent Labs  Lab 02/10/22 2111 02/11/22 0726 02/11/22 1107 02/11/22 1603 02/11/22 2016  GLUCAP 229* 171* 198* 174* 159*   Lipid Profile: No results for input(s): CHOL, HDL, LDLCALC, TRIG, CHOLHDL, LDLDIRECT in the last 72 hours. Thyroid Function Tests: No results for input(s): TSH, T4TOTAL, FREET4, T3FREE, THYROIDAB in the last 72 hours.  Anemia Panel: No results for input(s): VITAMINB12, FOLATE, FERRITIN, TIBC, IRON, RETICCTPCT in the last 72 hours. Sepsis Labs: Recent Labs  Lab 02/08/22 0542 02/09/22 0440  PROCALCITON 0.46 0.30  LATICACIDVEN 1.7  --     Recent Results (from the past 240 hour(s))  Resp Panel by RT-PCR (Flu A&B, Covid) Nasopharyngeal Swab     Status: None   Collection Time: 02/08/22  5:42  AM   Specimen: Nasopharyngeal Swab; Nasopharyngeal(NP) swabs in vial transport medium  Result Value Ref Range Status   SARS Coronavirus 2 by RT PCR NEGATIVE NEGATIVE Final    Comment: (NOTE) SARS-CoV-2 target nucleic acids are NOT DETECTED.  The SARS-CoV-2 RNA is generally detectable in upper respiratory specimens during the acute phase of infection. The lowest concentration of SARS-CoV-2 viral copies this assay can detect is 138 copies/mL. A negative result does not preclude SARS-Cov-2 infection and should not be used as the sole basis for treatment or other patient management decisions. A negative result may occur with  improper specimen collection/handling, submission of specimen other than nasopharyngeal swab, presence of viral mutation(s) within the areas targeted by this assay, and inadequate number of viral copies(<138 copies/mL). A negative result must be combined with clinical observations, patient history, and epidemiological information. The expected result is Negative.  Fact Sheet for Patients:  EntrepreneurPulse.com.au  Fact Sheet for Healthcare Providers:  IncredibleEmployment.be  This  test is no t yet approved or cleared by the [place] FDA and  has been authorized for detection and/or diagnosis of SARS-CoV-2 by FDA under an Emergency Use Authorization (EUA). This EUA will remain  in effect (meaning this test can be used) for the duration of the COVID-19 declaration under Section 564(b)(1) of the Act, 21 U.S.C.section 360bbb-3(b)(1), unless the authorization is terminated  or revoked sooner.       Influenza A by PCR NEGATIVE NEGATIVE Final   Influenza B by PCR NEGATIVE NEGATIVE Final    Comment: (NOTE) The Xpert Xpress SARS-CoV-2/FLU/RSV plus assay is intended as an aid in the diagnosis of influenza from Nasopharyngeal swab specimens and should not be used as a sole basis for treatment. Nasal washings and aspirates are unacceptable for Xpert Xpress SARS-CoV-2/FLU/RSV testing.  Fact Sheet for Patients: EntrepreneurPulse.com.au  Fact Sheet for Healthcare Providers: IncredibleEmployment.be  This test is not yet approved or cleared by the [place] FDA and has been authorized for detection and/or diagnosis of SARS-CoV-2 by FDA under an Emergency Use Authorization (EUA). This EUA will remain in effect (meaning this test can be used) for the duration of the COVID-19 declaration under Section 564(b)(1) of the Act, 21 U.S.C. section 360bbb-3(b)(1), unless the authorization is terminated or revoked.  Performed at [facility], [address] [place], [place] [address]   Blood Culture (routine x 2)     Status: None (Preliminary result)   Collection Time: 02/08/22  5:42 AM   Specimen: BLOOD RIGHT FOREARM  Result Value Ref Range Status   Specimen Description BLOOD RIGHT FOREARM  Final   Special Requests   Final    BOTTLES DRAWN AEROBIC AND ANAEROBIC Blood Culture adequate volume   Culture   Final    NO GROWTH 3 DAYS Performed at [facility], [address], [place], [place] [address]    Report  Status PENDING  Incomplete  Blood Culture (routine x 2)     Status: Abnormal   Collection Time: 02/08/22  5:42 AM   Specimen: BLOOD  Result Value Ref Range Status   Specimen Description   Final    BLOOD RIGHT ANTECUBITAL Performed at [facility], [place] [address], [place], [place] [address]    Special Requests   Final    BOTTLES DRAWN AEROBIC AND ANAEROBIC Blood Culture adequate volume Performed at [facility], [address], [place], [place] [address]    Culture  Setup Time   Final    Lonell Grandchild  POSITIVE COCCI AEROBIC BOTTLE ONLY CRITICAL RESULT CALLED TO, READ BACK BY AND VERIFIED WITH: JASON BELUE PHARMD AT 0330 02/09/2022 GA    Culture (A)  Final    STAPHYLOCOCCUS HOMINIS THE SIGNIFICANCE OF ISOLATING THIS ORGANISM FROM A SINGLE SET OF BLOOD CULTURES WHEN MULTIPLE SETS ARE DRAWN IS UNCERTAIN. PLEASE NOTIFY THE MICROBIOLOGY DEPARTMENT WITHIN ONE WEEK IF SPECIATION AND SENSITIVITIES ARE REQUIRED. Performed at [facility], [place] [address] [place], [place] [address]    Report Status 02/11/2022 FINAL  Final  Urine Culture     Status: None   Collection Time: 02/08/22  5:42 AM   Specimen: In/Out Cath Urine  Result Value Ref Range Status   Specimen Description   Final    IN/OUT CATH URINE Performed at [facility], [address], [place], [place] [address]    Special Requests   Final    NONE Performed at [facility], [address] [place], [place] [address]    Culture   Final    NO GROWTH Performed at [facility], [place] [address] [place], [place] [address]    Report Status 02/09/2022 FINAL  Final  Blood Culture ID Panel (Reflexed)     Status: Abnormal   Collection Time: 02/08/22  5:42 AM  Result Value Ref Range Status   Enterococcus faecalis NOT DETECTED NOT DETECTED Final   Enterococcus Faecium NOT DETECTED NOT DETECTED Final   Listeria monocytogenes NOT DETECTED NOT DETECTED Final   Staphylococcus species DETECTED (A) NOT  DETECTED Final    Comment: CRITICAL RESULT CALLED TO, READ BACK BY AND VERIFIED WITH: JASON BELUE PHARMD AT 0330 02/09/2022 GA    Staphylococcus aureus (BCID) NOT DETECTED NOT DETECTED Final   Staphylococcus epidermidis NOT DETECTED NOT DETECTED Final   Staphylococcus lugdunensis NOT DETECTED NOT DETECTED Final   Streptococcus species NOT DETECTED NOT DETECTED Final   Streptococcus agalactiae NOT DETECTED NOT DETECTED Final   Streptococcus pneumoniae NOT DETECTED NOT DETECTED Final   Streptococcus pyogenes NOT DETECTED NOT DETECTED Final   A.calcoaceticus-baumannii NOT DETECTED NOT DETECTED Final   Bacteroides fragilis NOT DETECTED NOT DETECTED Final   Enterobacterales NOT DETECTED NOT DETECTED Final   Enterobacter cloacae complex NOT DETECTED NOT DETECTED Final   Escherichia coli NOT DETECTED NOT DETECTED Final   Klebsiella aerogenes NOT DETECTED NOT DETECTED Final   Klebsiella oxytoca NOT DETECTED NOT DETECTED Final   Klebsiella pneumoniae NOT DETECTED NOT DETECTED Final   Proteus species NOT DETECTED NOT DETECTED Final   Salmonella species NOT DETECTED NOT DETECTED Final   Serratia marcescens NOT DETECTED NOT DETECTED Final   Haemophilus influenzae NOT DETECTED NOT DETECTED Final   Neisseria meningitidis NOT DETECTED NOT DETECTED Final   Pseudomonas aeruginosa NOT DETECTED NOT DETECTED Final   Stenotrophomonas maltophilia NOT DETECTED NOT DETECTED Final   Candida albicans NOT DETECTED NOT DETECTED Final   Candida auris NOT DETECTED NOT DETECTED Final   Candida glabrata NOT DETECTED NOT DETECTED Final   Candida krusei NOT DETECTED NOT DETECTED Final   Candida parapsilosis NOT DETECTED NOT DETECTED Final   Candida tropicalis NOT DETECTED NOT DETECTED Final   Cryptococcus neoformans/gattii NOT DETECTED NOT DETECTED Final    Comment: Performed at [facility], [address] [place], [place] [address]  MRSA Next Gen by PCR, Nasal     Status: None   Collection Time:  02/08/22  2:35 PM   Specimen: Nasal Mucosa; Nasal Swab  Result Value Ref Range Status  MRSA by PCR Next Gen NOT DETECTED NOT DETECTED Final    Comment: (NOTE) The GeneXpert MRSA Assay (FDA approved for NASAL specimens only), is one component of a comprehensive MRSA colonization surveillance program. It is not intended to diagnose MRSA infection nor to guide or monitor treatment for MRSA infections. Test performance is not FDA approved in patients less than 37 years old. Performed at [facility], [address] [place], [place] [address]   Aerobic/Anaerobic Culture w Gram Stain (surgical/deep wound)     Status: None (Preliminary result)   Collection Time: 02/11/22  3:34 PM   Specimen: Abscess; Body Fluid  Result Value Ref Range Status   Specimen Description   Final    ABSCESS Performed at [facility], [address] [place], [place] [address]    Special Requests LEFT BREAST ASPIRATION  Final   Gram Stain   Final    FEW WBC PRESENT,BOTH PMN AND MONONUCLEAR NO ORGANISMS SEEN Performed at [facility], [place] [address], [place], [place] [address]    Culture PENDING  Incomplete   Report Status PENDING  Incomplete      Radiology Studies: US BREAST LTD UNI LEFT INC AXILLA  Result Date: 02/11/2022 CLINICAL DATA:  Patient is status post lumpectomy left breast in 2022 with a known left breast seroma. Patient has been admitted for sepsis. The patient's physician requested ultrasound scanning of the patient's known left breast seroma. EXAM: ULTRASOUND OF THE LEFT BREAST COMPARISON:  December 05, 2021 FINDINGS: Targeted ultrasound is performed, showing stable seroma at the left breast upper-outer quadrant measuring maximum 10.9 cm unchanged compared to prior ultrasound. IMPRESSION: Benign findings. RECOMMENDATION: Bilateral diagnostic mammogram back on schedule. I have discussed the findings and recommendations with the patient. If applicable, a reminder letter will  be sent to the patient regarding the next appointment. BI-RADS CATEGORY  2: Benign. Electronically Signed   By: Abelardo Diesel M.D.   On: 02/11/2022 09:52   ECHOCARDIOGRAM COMPLETE  Result Date: 02/11/2022    ECHOCARDIOGRAM REPORT   Patient Name:   DASHAWNA DELBRIDGE Date of Exam: 02/10/2022 Medical Rec #:  [id number]      Height:       64.0 in Accession #:    [id number]     Weight:       239.0 lb Date of Birth:  [birth year]      BSA:          2.110 m Patient Age:    80 years       BP:           115/77 mmHg Patient Gender: F              HR:           146 bpm. Exam Location:  [facility] Procedure: 2D Echo, Cardiac Doppler and Color Doppler Indications:     Atrial Fibrillation I48.91  History:         Patient has no prior history of Echocardiogram examinations.                  Signs/Symptoms:Murmur; Risk Factors:Hypertension and Diabetes.  Sonographer:     Sherrie Sport Referring Phys:  [id number] AGBATA Diagnosing Phys: Yolonda Kida MD  Sonographer Comments: Suboptimal apical window, no subcostal window and Technically challenging study due to limited acoustic windows. IMPRESSIONS  1. Hyperdynamic LVF.  2. Severe AS.  3. Left ventricular ejection fraction, by estimation, is >75%. The left ventricle has hyperdynamic  function. The left ventricle has no regional wall motion abnormalities. There is mild left ventricular hypertrophy. Left ventricular diastolic function could not be evaluated.  4. Right ventricular systolic function is normal. The right ventricular size is normal.  5. Left atrial size was mildly dilated.  6. Right atrial size was mildly dilated.  7. The mitral valve is normal in structure. Trivial mitral valve regurgitation.  8. The aortic valve is calcified. Aortic valve regurgitation is trivial. Severe aortic valve stenosis. FINDINGS  Left Ventricle: Left ventricular ejection fraction, by estimation, is >75%. The left ventricle has hyperdynamic function. The left ventricle has no regional wall motion  abnormalities. The left ventricular internal cavity size was small. There is mild left ventricular hypertrophy. Left ventricular diastolic function could not be evaluated. Right Ventricle: The right ventricular size is normal. No increase in right ventricular wall thickness. Right ventricular systolic function is normal. Left Atrium: Left atrial size was mildly dilated. Right Atrium: Right atrial size was mildly dilated. Pericardium: There is no evidence of pericardial effusion. Mitral Valve: The mitral valve is normal in structure. Trivial mitral valve regurgitation. Tricuspid Valve: The tricuspid valve is normal in structure. Tricuspid valve regurgitation is trivial. Aortic Valve: The aortic valve is calcified. Aortic valve regurgitation is trivial. Severe aortic stenosis is present. Aortic valve mean gradient measures 17.3 mmHg. Aortic valve peak gradient measures 29.7 mmHg. Aortic valve area, by VTI measures 0.61 cm. Pulmonic Valve: The pulmonic valve was normal in structure. Pulmonic valve regurgitation is not visualized. Aorta: The ascending aorta was not well visualized. IAS/Shunts: No atrial level shunt detected by color flow Doppler. Additional Comments: Hyperdynamic LVF. Severe AS. There is no pleural effusion.  LEFT VENTRICLE PLAX 2D LVIDd:         3.90 cm LVIDs:         2.20 cm LV PW:         1.20 cm LV IVS:        1.20 cm LVOT diam:     2.00 cm LV SV:         26 LV SV Index:   12 LVOT Area:     3.14 cm  LEFT ATRIUM            Index        RIGHT ATRIUM           Index LA diam:      3.30 cm  1.56 cm/m   RA Area:     29.30 cm LA Vol (A4C): 121.0 ml 57.34 ml/m  RA Volume:   106.00 ml 50.23 ml/m  AORTIC VALVE                     PULMONIC VALVE AV Area (Vmax):    0.61 cm      PV Vmax:        0.78 m/s AV Area (Vmean):   0.61 cm      PV Vmean:       51.900 cm/s AV Area (VTI):     0.61 cm      PV VTI:         0.116 m AV Vmax:           272.33 cm/s   PV Peak grad:   2.4 mmHg AV Vmean:          192.000  cm/s  PV Mean grad:   1.0 mmHg AV VTI:            0.424  m       RVOT Peak grad: 6 mmHg AV Peak Grad:      29.7 mmHg AV Mean Grad:      17.3 mmHg LVOT Vmax:         52.90 cm/s LVOT Vmean:        37.000 cm/s LVOT VTI:          0.083 m LVOT/AV VTI ratio: 0.20  AORTA Ao Root diam: 2.80 cm MITRAL VALVE MV Area (PHT): 5.23 cm    SHUNTS MV Decel Time: 145 msec    Systemic VTI:  0.08 m MV E velocity: 86.50 cm/s  Systemic Diam: 2.00 cm                            Pulmonic VTI:  0.151 m Yolonda Kida MD Electronically signed by Yolonda Kida MD Signature Date/Time: 02/11/2022/4:13:23 PM    Final    US BREAST ASPIRATION LEFT  Result Date: 02/11/2022 INDICATION: Left breast fluid collection following lumpectomy, concern for secondary infection EXAM: Ultrasound-guided drainage of left breast fluid collection MEDICATIONS: None. ANESTHESIA/SEDATION: Local analgesia FLUOROSCOPY TIME:  N/a COMPLICATIONS: None immediate. PROCEDURE: Informed written consent was obtained from the patient after a thorough discussion of the procedural risks, benefits and alternatives. All questions were addressed. Maximal Sterile Barrier Technique was utilized including caps, mask, sterile gowns, sterile gloves, sterile drape, hand hygiene and skin antiseptic. A timeout was performed prior to the initiation of the procedure. The patient was placed supine on the exam table. Limited ultrasound of the left breast was performed in the area of interest. This again demonstrated a mixed echogenicity predominantly hypoechoic fluid collection with internal septations. Comparison with previous imaging suggest an evolving and liquified hematoma. Skin entry site was marked, and the overlying skin was prepped and draped in a standard sterile fashion. Local analgesia was obtained with 1% lidocaine. Using ultrasound guidance, a 19 gauge Yueh catheter was advanced into the largest anechoic pocket within the identified collection. There was immediate return of  thin dark red/brown fluid. A total of approximately 125 mL of this fluid was aspirated, and sent to the lab for analysis. Postprocedure imaging demonstrates appropriate decompression of the anechoic fluid components, with residual more inflammatory debris remaining. The patient tolerated all aspects of the procedure well without immediate complication. A clean dressing was placed. The patient was transferred in stable condition. IMPRESSION: Successful ultrasound-guided drainage of the left breast fluid collection with aspiration of 125 mL of thin dark red/brown fluid. A sample was sent to the lab for analysis. Imaging and aspirate findings are most suggestive of a liquified hematoma. Attention on microbiology studies for any findings of infection. Electronically Signed   By: Albin Felling M.D.   On: 02/11/2022 15:30     Scheduled Meds:  apixaban  5 mg Oral BID   aspirin  81 mg Oral Daily   atorvastatin  80 mg Oral QPM   Chlorhexidine Gluconate Cloth  6 each Topical Daily   diltiazem  240 mg Oral Daily   dorzolamide-timolol  1 drop Both Eyes BID   fesoterodine  4 mg Oral Daily   fluticasone  2 spray Each Nare Daily   insulin aspart  0-15 Units Subcutaneous TID WC   latanoprost  1 drop Both Eyes QHS   letrozole  2.5 mg Oral Daily   loratadine  10 mg Oral Daily   metoprolol tartrate  50 mg Oral BID  multivitamin with minerals  1 tablet Oral Daily   pantoprazole  40 mg Oral Daily   polyethylene glycol  34 g Oral Q2H   polyvinyl alcohol  1 drop Both Eyes Daily   pregabalin  100 mg Oral TID   sertraline  100 mg Oral Daily   Continuous Infusions:  amiodarone 30 mg/hr (02/11/22 0551)    ceFAZolin (ANCEF) IV       LOS: 4 days     Enzo Bi, MD [facility] If 7PM-7AM, please contact night-coverage 02/12/2022, 2:57 AM

## 2022-02-11 NOTE — Consult Note (Signed)
Patient reports increased pain in the left breast, very difficult to discern if this is secondary to an infection involving the seroma.  Apparently today's ultrasound did not show any significant change on imaging from prior appearance. I believe she would benefit from at least an aspiration of this fluid for testing, culture and sensitivity.  It may be therapeutic as well. I will proceed with ordering this.  Thank you

## 2022-02-12 DIAGNOSIS — A419 Sepsis, unspecified organism: Secondary | ICD-10-CM | POA: Diagnosis not present

## 2022-02-12 LAB — GLUCOSE, CAPILLARY
Glucose-Capillary: 196 mg/dL — ABNORMAL HIGH (ref 70–99)
Glucose-Capillary: 178 mg/dL — ABNORMAL HIGH (ref 70–99)
Glucose-Capillary: 152 mg/dL — ABNORMAL HIGH (ref 70–99)
Glucose-Capillary: 149 mg/dL — ABNORMAL HIGH (ref 70–99)

## 2022-02-12 LAB — BASIC METABOLIC PANEL
Anion gap: 11 (ref 5–15)
BUN: 13 mg/dL (ref 8–23)
CO2: 28 mmol/L (ref 22–32)
Calcium: 8.7 mg/dL — ABNORMAL LOW (ref 8.9–10.3)
Chloride: 98 mmol/L (ref 98–111)
Creatinine, Ser: 0.54 mg/dL (ref 0.44–1.00)
GFR, Estimated: >60 mL/min (ref >60)
Glucose, Bld: 205 mg/dL — ABNORMAL HIGH (ref 70–99)
Potassium: 4 mmol/L (ref 3.5–5.1)
Sodium: 137 mmol/L (ref 135–145)

## 2022-02-12 LAB — CBC
HCT: 31.2 % — ABNORMAL LOW (ref 36.0–46.0)
Hemoglobin: 9.9 g/dL — ABNORMAL LOW (ref 12.0–15.0)
MCH: 27.6 pg (ref 26.0–34.0)
MCHC: 31.7 g/dL (ref 30.0–36.0)
MCV: 86.9 fL (ref 80.0–100.0)
Platelets: 247 10*3/uL (ref 150–400)
RBC: 3.59 MIL/uL — ABNORMAL LOW (ref 3.87–5.11)
RDW: 15.1 % (ref 11.5–15.5)
WBC: 8.2 10*3/uL (ref 4.0–10.5)
nRBC: 0 % (ref 0.0–0.2)

## 2022-02-12 LAB — MAGNESIUM: Magnesium: 2 mg/dL (ref 1.7–2.4)

## 2022-02-12 MED ORDER — METOPROLOL TARTRATE 5 MG/5ML IV SOLN
5.0000 mg | Freq: Once | INTRAVENOUS | Status: AC
  Administered 2022-02-12: 5 mg via INTRAVENOUS
  Filled 2022-02-12: qty 5

## 2022-02-12 MED ORDER — ADENOSINE 6 MG/2ML IV SOLN
INTRAVENOUS | Status: AC
  Administered 2022-02-12: 6 mg
  Filled 2022-02-12: qty 2

## 2022-02-12 MED ORDER — METOPROLOL TARTRATE 50 MG PO TABS
50.0000 mg | ORAL_TABLET | Freq: Once | ORAL | Status: AC
  Administered 2022-02-12: 50 mg via ORAL
  Filled 2022-02-12: qty 1

## 2022-02-12 MED ORDER — METOPROLOL TARTRATE 50 MG PO TABS
100.0000 mg | ORAL_TABLET | Freq: Two times a day (BID) | ORAL | Status: DC
  Administered 2022-02-12 – 2022-02-13 (×3): 100 mg via ORAL
  Filled 2022-02-12 (×3): qty 2

## 2022-02-12 MED ORDER — DIGOXIN 0.25 MG/ML IJ SOLN
0.5000 mg | Freq: Once | INTRAMUSCULAR | Status: AC
  Administered 2022-02-12: 0.5 mg via INTRAVENOUS
  Filled 2022-02-12: qty 2

## 2022-02-12 MED ORDER — ADENOSINE 6 MG/2ML IV SOLN: 3.0000 mg | Freq: Once | INTRAVENOUS | Status: DC

## 2022-02-12 NOTE — Progress Notes (Signed)
Rhonda Thornton reports some improvement following her seroma aspiration yesterday.  No organisms were seen on her Gram stain of the fluid. Appreciate IR's assistance at obtaining the specimen, hopefully providing some relief from the discomfort associated. I do not anticipate any role for surgery in dealing with the seroma, if it appears to be contributing to this patient's sepsis/cellulitis, additional aspirations after treatment of antibiotics or possible IR placement of a drain may be necessary. Please feel free to call if I can be of any further help.

## 2022-02-12 NOTE — Progress Notes (Signed)
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 Cardiology consulted for new onset atrial fibrillation with RVR.    SUBJECTIVE: The patient is feeling "okay" and she denies having any cardiac symptoms at this time. Her previous symptoms of left breast pain has now resolved. She is breathing well on the 2L of supplemental oxygen.   OBJECTIVE:   Vitals:   02/11/22 2005 02/11/22 2110 02/11/22 2324 02/12/22 0330  BP: (!) 115/96 102/79 113/84 102/74  Pulse: (!) 146 (!) 147 (!) 144 (!) 142  Resp: 16 18 18 16   Temp: 98.3 F (36.8 C) 98.3 F (36.8 C) 98.5 F (36.9 C) 98.7 F (37.1 C)  TempSrc: Oral Oral  Oral  SpO2: 94% 94% 95% 95%  Weight:      Height:         Intake/Output Summary (Last 24 hours) at 02/12/2022 7989 Last data filed at 02/12/2022 2119 Gross per 24 hour  Intake 547.31 ml  Output 600 ml  Net -52.69 ml      PHYSICAL EXAM  General: Well developed, well nourished, in no acute distress HEENT:  Normocephalic and atraumatic. PERRL Neck:  No JVD.  Lungs: Clear bilaterally in upper lobes, diminished breath sounds to lower lobes upon auscultation. Chest expansion symmetrical. No wheezes, rales, crackles or rhonchi.  Heart: HR regular but notably fast. Normal S1 and S2 without gallops or murmurs.  Abdomen: distended. Bowel sounds are positive, abdomen soft and non-tender. Msk:  Normal strength and tone for age. Extremities: No clubbing, cyanosis or edema.   Neuro: Alert and oriented X 3. Psych:  Good affect, responds appropriately   LABS: Basic Metabolic Panel: Recent Labs    02/11/22 0620 02/12/22 0612  NA 136 137  K 3.8 4.0  CL 99 98  CO2 27  28  GLUCOSE 188* 205*  BUN 15 13  CREATININE 0.45 0.54  CALCIUM 8.4* 8.7*  MG 1.8 2.0   Liver Function Tests: No results for input(s): AST, ALT, ALKPHOS, BILITOT, PROT, ALBUMIN in the last 72 hours. No results for input(s): LIPASE, AMYLASE in the last 72 hours. CBC: Recent Labs    02/11/22 0620 02/12/22 0612  WBC 6.9 8.2  HGB 9.1* 9.9*  HCT 29.3* 31.2*  MCV 88.0 86.9  PLT 186 247   Cardiac Enzymes: No results for input(s): CKTOTAL, CKMB, CKMBINDEX, TROPONINI in the last 72 hours. BNP: Invalid input(s): POCBNP D-Dimer: No results for input(s): DDIMER in the last 72 hours. Hemoglobin A1C: No results for input(s): HGBA1C in the last 72 hours. Fasting Lipid Panel: No results for input(s): CHOL, HDL, LDLCALC, TRIG, CHOLHDL, LDLDIRECT in the last 72 hours. Thyroid Function Tests: No results for input(s): TSH, T4TOTAL, T3FREE, THYROIDAB in the last 72 hours.  Invalid input(s): FREET3 Anemia Panel: No results for input(s): VITAMINB12, FOLATE, FERRITIN, TIBC, IRON, RETICCTPCT in the last 72 hours.  US BREAST LTD UNI LEFT INC AXILLA  Result Date: 02/11/2022 CLINICAL DATA:  Patient is status post lumpectomy left breast in 2022 with a known left breast seroma. Patient has been admitted for sepsis. The patient's physician requested ultrasound scanning of the patient's known left breast seroma. EXAM: ULTRASOUND OF THE LEFT BREAST COMPARISON:  December 05, 2021  FINDINGS: Targeted ultrasound is performed, showing stable seroma at the left breast upper-outer quadrant measuring maximum 10.9 cm unchanged compared to prior ultrasound. IMPRESSION: Benign findings. RECOMMENDATION: Bilateral diagnostic mammogram back on schedule. I have discussed the findings and recommendations with the patient. If applicable, a reminder letter will be sent to the patient regarding the next appointment. BI-RADS CATEGORY  2: Benign. Electronically Signed   By: Abelardo Diesel M.D.   On: 02/11/2022 09:52    ECHOCARDIOGRAM COMPLETE  Result Date: 02/11/2022    ECHOCARDIOGRAM REPORT   Patient Name:   Rhonda Thornton Date of Exam: 02/10/2022 Medical Rec #:  [id number]      Height:       64.0 in Accession #:    [id number]     Weight:       239.0 lb Date of Birth:  [birth year]      BSA:          2.110 m Patient Age:    60 years       BP:           115/77 mmHg Patient Gender: F              HR:           146 bpm. Exam Location:  [facility] Procedure: 2D Echo, Cardiac Doppler and Color Doppler Indications:     Atrial Fibrillation I48.91  History:         Patient has no prior history of Echocardiogram examinations.                  Signs/Symptoms:Murmur; Risk Factors:Hypertension and Diabetes.  Sonographer:     Sherrie Sport Referring Phys:  [id number] AGBATA Diagnosing Phys: Yolonda Kida MD  Sonographer Comments: Suboptimal apical window, no subcostal window and Technically challenging study due to limited acoustic windows. IMPRESSIONS  1. Hyperdynamic LVF.  2. Severe AS.  3. Left ventricular ejection fraction, by estimation, is >75%. The left ventricle has hyperdynamic function. The left ventricle has no regional wall motion abnormalities. There is mild left ventricular hypertrophy. Left ventricular diastolic function could not be evaluated.  4. Right ventricular systolic function is normal. The right ventricular size is normal.  5. Left atrial size was mildly dilated.  6. Right atrial size was mildly dilated.  7. The mitral valve is normal in structure. Trivial mitral valve regurgitation.  8. The aortic valve is calcified. Aortic valve regurgitation is trivial. Severe aortic valve stenosis. FINDINGS  Left Ventricle: Left ventricular ejection fraction, by estimation, is >75%. The left ventricle has hyperdynamic function. The left ventricle has no regional wall motion abnormalities. The left ventricular internal cavity size was small. There is mild left ventricular hypertrophy. Left ventricular diastolic function could not  be evaluated. Right Ventricle: The right ventricular size is normal. No increase in right ventricular wall thickness. Right ventricular systolic function is normal. Left Atrium: Left atrial size was mildly dilated. Right Atrium: Right atrial size was mildly dilated. Pericardium: There is no evidence of pericardial effusion. Mitral Valve: The mitral valve is normal in structure. Trivial mitral valve regurgitation. Tricuspid Valve: The tricuspid valve is normal in structure. Tricuspid valve regurgitation is trivial. Aortic Valve: The aortic valve is calcified. Aortic valve regurgitation is trivial. Severe aortic stenosis is present. Aortic valve mean gradient measures 17.3 mmHg. Aortic valve peak gradient measures 29.7 mmHg. Aortic valve area, by VTI measures 0.61 cm. Pulmonic Valve: The pulmonic valve was normal in structure. Pulmonic valve regurgitation is not visualized.  Aorta: The ascending aorta was not well visualized. IAS/Shunts: No atrial level shunt detected by color flow Doppler. Additional Comments: Hyperdynamic LVF. Severe AS. There is no pleural effusion.  LEFT VENTRICLE PLAX 2D LVIDd:         3.90 cm LVIDs:         2.20 cm LV PW:         1.20 cm LV IVS:        1.20 cm LVOT diam:     2.00 cm LV SV:         26 LV SV Index:   12 LVOT Area:     3.14 cm  LEFT ATRIUM            Index        RIGHT ATRIUM           Index LA diam:      3.30 cm  1.56 cm/m   RA Area:     29.30 cm LA Vol (A4C): 121.0 ml 57.34 ml/m  RA Volume:   106.00 ml 50.23 ml/m  AORTIC VALVE                     PULMONIC VALVE AV Area (Vmax):    0.61 cm      PV Vmax:        0.78 m/s AV Area (Vmean):   0.61 cm      PV Vmean:       51.900 cm/s AV Area (VTI):     0.61 cm      PV VTI:         0.116 m AV Vmax:           272.33 cm/s   PV Peak grad:   2.4 mmHg AV Vmean:          192.000 cm/s  PV Mean grad:   1.0 mmHg AV VTI:            0.424 m       RVOT Peak grad: 6 mmHg AV Peak Grad:      29.7 mmHg AV Mean Grad:      17.3 mmHg LVOT Vmax:          52.90 cm/s LVOT Vmean:        37.000 cm/s LVOT VTI:          0.083 m LVOT/AV VTI ratio: 0.20  AORTA Ao Root diam: 2.80 cm MITRAL VALVE MV Area (PHT): 5.23 cm    SHUNTS MV Decel Time: 145 msec    Systemic VTI:  0.08 m MV E velocity: 86.50 cm/s  Systemic Diam: 2.00 cm                            Pulmonic VTI:  0.151 m Yolonda Kida MD Electronically signed by Yolonda Kida MD Signature Date/Time: 02/11/2022/4:13:23 PM    Final    US BREAST ASPIRATION LEFT  Result Date: 02/11/2022 INDICATION: Left breast fluid collection following lumpectomy, concern for secondary infection EXAM: Ultrasound-guided drainage of left breast fluid collection MEDICATIONS: None. ANESTHESIA/SEDATION: Local analgesia FLUOROSCOPY TIME:  N/a COMPLICATIONS: None immediate. PROCEDURE: Informed written consent was obtained from the patient after a thorough discussion of the procedural risks, benefits and alternatives. All questions were addressed. Maximal Sterile Barrier Technique was utilized including caps, mask, sterile gowns, sterile gloves, sterile drape, hand hygiene and skin antiseptic. A timeout was performed prior to the initiation of  the procedure. The patient was placed supine on the exam table. Limited ultrasound of the left breast was performed in the area of interest. This again demonstrated a mixed echogenicity predominantly hypoechoic fluid collection with internal septations. Comparison with previous imaging suggest an evolving and liquified hematoma. Skin entry site was marked, and the overlying skin was prepped and draped in a standard sterile fashion. Local analgesia was obtained with 1% lidocaine. Using ultrasound guidance, a 19 gauge Yueh catheter was advanced into the largest anechoic pocket within the identified collection. There was immediate return of thin dark red/brown fluid. A total of approximately 125 mL of this fluid was aspirated, and sent to the lab for analysis. Postprocedure imaging demonstrates  appropriate decompression of the anechoic fluid components, with residual more inflammatory debris remaining. The patient tolerated all aspects of the procedure well without immediate complication. A clean dressing was placed. The patient was transferred in stable condition. IMPRESSION: Successful ultrasound-guided drainage of the left breast fluid collection with aspiration of 125 mL of thin dark red/brown fluid. A sample was sent to the lab for analysis. Imaging and aspirate findings are most suggestive of a liquified hematoma. Attention on microbiology studies for any findings of infection. Electronically Signed   By: Albin Felling M.D.   On: 02/11/2022 15:30    TELEMETRY: appears to be ST with HR of 144 bpm; however, atrial flutter is also suspected.   ASSESSMENT AND PLAN:  Principal Problem:   Sepsis ([place]) Active Problems:   Carcinoma of upper-outer quadrant of female breast, left ([place])   Type 2 diabetes mellitus without complications ([place])   Essential (primary) hypertension   Sleep apnea   Morbid obesity ([place])   Cellulitis of left breast   Atrial fibrillation with RVR ([place])    # Atrial flutter versus ST  The patient was found to be in atrial fibrillation with RVR upon admission on 2/18. She converted spontaneously to NSR but went back into a-fib on 2/20. The patient is now in what appears to be ST with a HR of 144 bpm per telemetry review; however, suspicious for atrial flutter. The patient remains asymptomatic at this time. Rate control is needed. The patient has a CHADsVASc score of 5. She has been on eliquis for anticoagulation.   - Give 5 mg of metoprolol IV stat.  - increase metoprolol tartrate to 100mg  by mouth twice daily.   - consider adenosine if HR does not improve.  - continue amiodarone gtt.   - continue diltiazem.   - continue eliquis for anticoagulation and stroke risk reduction. Eliquis should be continued as outpatient for at least 1 month and then re-evaluated the  need per primary cardiologist.   - continuous telemetry monitoring until discharge.    # Severe AS Fairly stable. Patient is currently asymptomatic. Echo revealed severe AS, hyperdynamic LVF with an estimated EF >75%, no regional wall motion abnormalities and mildly dilated LA & RA.  - conservative management at this time.   - CTS evaluation as outpatient.   # COPD exacerbation # Chronic respiratory failure with hypoxia Stable. Patient's dyspnea has improved. She continues to be on 2L of Oxygen via [place] with oxygen saturation >90.  - Agree with current management per hospitalist team.   - RT evaluation appreciated.    # Sepsis from left breast cellulitis s/p [place] left breast aspiration of fluid # Stage Ia invasive mammary carcinoma s/p lumpectomy and radiation  - Management per hospitality, oncology and radiology team.   #HLD  -  continue atorvastatin.   #HTN Reasonably controlled.  - beta blocker and calcium channel blocker as above.   - heart healthy diet.    #DM2 Reasonably controlled.    - Agree with current management with SSI.      Epiphany Seltzer, ACNPC-AG  02/12/2022 8:26 AM

## 2022-02-12 NOTE — Hospital Course (Addendum)
Rhonda Thornton is a 71 y.o. 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 Patient states that she has not felt well for the last 3 days and has been weak, fatigued, has had myalgias as well as fever and chills. She called EMS and when they arrived she was found to be in A-fib with a rapid ventricular rate with heart rates between 160 and 180. She complains of feeling very sore in her left breast and has had nausea.  2/19: Cardiology consulted for Afib with RVR 2/21: Gen Surg consulted for breast seroma, IR aspirated seroma, few cells, rare staph epi 2/24: Underwent TEE with [facility] for Afib, now back in sinus

## 2022-02-12 NOTE — Progress Notes (Signed)
12 mg of IV adenosine given at the bedside by this NP as directed by Dr. Clayborn Bigness. Medication was given through the newly placed 20G in the right hand that was patent. The patient momentarily went into SB with HR of 48 bpm, there was no evidence of atrial flutter and after approximately 10 seconds the patient went back into ST with at HR of 140 bpm.   The patient was on the cardiac monitor throughout the entire course and the code cart was at the bedside. The ICU charge Eduard Clos, RN), Cardiac Unit Charge Colletta [place], RN) and the bedside RN, Nate were all at the bedside along with the NP.

## 2022-02-12 NOTE — Progress Notes (Signed)
PT Cancellation Note  Patient Details Name: Rhonda Thornton MRN: [id number] DOB: 12/19/1951   Cancelled Treatment:    Reason Eval/Treat Not Completed: Medical issues which prohibited therapy. PT to hold exertional activity per cardiology due to elevated HR.    Lieutenant Diego PT, DPT 11:36 AM,02/12/22

## 2022-02-12 NOTE — Progress Notes (Signed)
PROGRESS NOTE    HILLARI ZUMWALT  YIR:[id number] DOB: 04/08/1951 DOA: 02/08/2022 PCP: Denton Lank, MD   Brief Narrative:  Rhonda Thornton is a 71 y.o. 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 Patient states that she has not felt well for the last 3 days and has been weak, fatigued, has had myalgias as well as fever and chills. She called EMS and when they arrived she was found to be in A-fib with a rapid ventricular rate with heart rates between 160 and 180. She complains of feeling very sore in her left breast and has had nausea.  Assessment & Plan:   Principal Problem:   Sepsis ([place]) Active Problems:   Carcinoma of upper-outer quadrant of female breast, left ([place])   Type 2 diabetes mellitus without complications ([place])   Essential (primary) hypertension   Sleep apnea   Morbid obesity ([place])   Cellulitis of left breast   Atrial fibrillation with RVR ([facility])  Sepsis from left breast cellulitis: She presented with fever, tachycardia, tachypnea, leukocytosis and left breast cellulitis Patient has  known large seroma in her left breast.   Imaging did not show any evidence of an abscess at this time. Empirically initiated on vancomycin and ceftriaxone and then broadened to Zosyn. Now narrowed down to Ancef as per ID pharmacy. Sepsis physiology improving.   Stage Ia invasive Breast carcinoma status post lumpectomy and whole breast radiation of her left breast. Known large seroma Continue Femara  Informed Dr. Grayland Ormond , pt will follow up as outpatient. Informed Dr. Christian Mate about the seroma, who recommended fluid aspiration. She underwent ultrasound-guided aspiration by IR. 125 ml dark red/brown fluid  removed and sent for culture.   Atrial fibrillation with RVR: Likely triggered by sepsis, started on Lovenox for stroke prophylaxis. Will eventually transition to Eliquis before discharge. Cardiology consulted Dr. Nehemiah Massed. Heart rate was controlled and converted but now back to A-fib with RVR again. Currently HR still not controlled. Continue Cardizem to 40 mg daily Continue Lopressor 50 mg twice daily Continue amiodarone gtt. Continue Eliquis.  Diabetes mellitus: Continue regular insulin sliding scale.   Depression Continue Zoloft   Morbid obesity (BMI 41) Complicates overall prognosis and care.   Chronic hypoxic respiratory failure. Continue supplemental oxygen.   Chronic pain on chronic opioids Continue Norco as needed.   DVT prophylaxis: Eliquis Code Status: Full code Family Communication: No family at bedside Disposition Plan:  Status is: Inpatient Remains inpatient appropriate because:  Admitted for sepsis secondary to cellulitis on IV antibiotics and has developed A-fib with RVR which is uncontrolled cardiology is consulted.  Consultants:  Cardiology  Procedures: Ultrasound-guided aspiration of breast seroma Antimicrobials: ( Anti-infectives (From admission, onward)    Start     Dose/Rate Route Frequency Ordered Stop   02/12/22 0600  ceFAZolin (ANCEF) IVPB 1 g/50 mL premix  Status:  Discontinued        1 g 100 mL/hr over 30 Minutes Intravenous Every 12 hours 02/11/22 1156 02/11/22 1703   02/12/22 0600  ceFAZolin (ANCEF) IVPB 2g/100 mL premix        2 g 200 mL/hr over 30 Minutes Intravenous Every 8 hours 02/11/22 1703     02/09/22 2200  cefTRIAXone (ROCEPHIN) 2 g in  sodium chloride 0.9 % 100 mL IVPB  Status:  Discontinued        2 g 200 mL/hr over 30 Minutes Intravenous Every 24 hours 02/09/22 1815 02/11/22 1156   02/09/22 1000  vancomycin (VANCOREADY) IVPB 1250 mg/250 mL  Status:  Discontinued        1,250 mg 166.7 mL/hr over 90 Minutes Intravenous  Every 24 hours 02/08/22 1012 02/11/22 1156   02/08/22 1400  ceFEPIme (MAXIPIME) 2 g in sodium chloride 0.9 % 100 mL IVPB  Status:  Discontinued        2 g 200 mL/hr over 30 Minutes Intravenous Every 8 hours 02/08/22 1010 02/08/22 1025   02/08/22 1400  piperacillin-tazobactam (ZOSYN) IVPB 3.375 g  Status:  Discontinued        3.375 g 12.5 mL/hr over 240 Minutes Intravenous Every 8 hours 02/08/22 1025 02/09/22 1815   02/08/22 1000  ceFEPIme (MAXIPIME) 2 g in sodium chloride 0.9 % 100 mL IVPB  Status:  Discontinued        2 g 200 mL/hr over 30 Minutes Intravenous  Once 02/08/22 0944 02/08/22 1010   02/08/22 1000  metroNIDAZOLE (FLAGYL) IVPB 500 mg  Status:  Discontinued        500 mg 100 mL/hr over 60 Minutes Intravenous Every 12 hours 02/08/22 0944 02/08/22 1024   02/08/22 1000  vancomycin (VANCOCIN) IVPB 1000 mg/200 mL premix  Status:  Discontinued        1,000 mg 200 mL/hr over 60 Minutes Intravenous  Once 02/08/22 0944 02/08/22 0957   02/08/22 0545  vancomycin (VANCOREADY) IVPB 2000 mg/400 mL       See Hyperspace for full Linked Orders Report.   2,000 mg 200 mL/hr over 120 Minutes Intravenous  Once 02/08/22 0541 02/08/22 0854   02/08/22 0545  vancomycin (VANCOREADY) IVPB 500 mg/100 mL       See Hyperspace for full Linked Orders Report.   500 mg 100 mL/hr over 60 Minutes Intravenous  Once 02/08/22 0541 02/08/22 1000   02/08/22 0530  cefTRIAXone (ROCEPHIN) 2 g in sodium chloride 0.9 % 100 mL IVPB  Status:  Discontinued        2 g 200 mL/hr over 30 Minutes Intravenous Every 24 hours 02/08/22 0525 02/08/22 1010       Subjective: Patient was seen and examined at bedside.  Overnight events noted.  Patient reports feeling better.  She reports having pain in the left breast area denies any chest pain or shortness of breath.  Remains on her baseline oxygen requirement.   Objective: Vitals:   02/11/22 2324 02/12/22 0330 02/12/22 0829 02/12/22 1221  BP: 113/84 102/74 (!) 130/102 127/88   Pulse: (!) 144 (!) 142 (!) 144 (!) 146  Resp: 18 16 18 16   Temp: 98.5 F (36.9 C) 98.7 F (37.1 C) 97.8 F (36.6 C) 99 F (37.2 C)  TempSrc:  Oral Oral Oral  SpO2: 95% 95% 95% 96%  Weight:      Height:        Intake/Output Summary (Last 24 hours) at 02/12/2022 1404 Last data filed at 02/12/2022 1030 Gross per 24 hour  Intake 787.31 ml  Output 1050 ml  Net -262.69 ml   Filed Weights   02/08/22 0536 02/08/22 1441  Weight: 108.4 kg 108.4 kg    Examination:  General exam: Appears comfortable, deconditioned, not in any acute distress. Respiratory system: CTA bilaterally, respiratory effort normal, no crackles, no wheezing. Cardiovascular system: S1 & S2 heard, Irregular rhythm, no  murmur.   Gastrointestinal system: Abdomen is soft, nontender, nondistended, BS+. Central nervous system: Alert and oriented x 3. No focal neurological deficits. Extremities: No edema, no cyanosis, no clubbing. Skin: No rashes, lesions or ulcers Psychiatry: Judgement and insight appear normal. Mood & affect appropriate.     Data Reviewed: I have personally reviewed following labs and imaging studies  CBC: Recent Labs  Lab 02/08/22 0542 02/09/22 0440 02/10/22 0502 02/11/22 0620 02/12/22 0612  WBC 13.7* 8.9 6.2 6.9 8.2  NEUTROABS 12.0*  --   --   --   --   HGB 11.0* 8.8* 8.1* 9.1* 9.9*  HCT 35.0* 28.6* 26.0* 29.3* 31.2*  MCV 88.8 89.7 89.0 88.0 86.9  PLT 173 158 151 186 517   Basic Metabolic Panel: Recent Labs  Lab 02/08/22 0542 02/08/22 0935 02/09/22 0440 02/10/22 0502 02/11/22 0620 02/12/22 0612  NA 136  --  133* 135 136 137  K 3.5  --  3.5 3.9 3.8 4.0  CL 97*  --  99 101 99 98  CO2 28  --  27 27 27 28   GLUCOSE 200*  --  175* 179* 188* 205*  BUN 25*  --  20 15 15 13   CREATININE 0.81  --  0.59 0.62 0.45 0.54  CALCIUM 9.5  --  8.4* 8.1* 8.4* 8.7*  MG  --  1.8  --  1.8 1.8 2.0   GFR: Estimated Creatinine Clearance: 77.6 mL/min (by C-G formula based on SCr of 0.54  mg/dL). Liver Function Tests: Recent Labs  Lab 02/08/22 0542  AST 18  ALT 14  ALKPHOS 63  BILITOT 1.1  PROT 7.7  ALBUMIN 3.8   No results for input(s): LIPASE, AMYLASE in the last 168 hours. No results for input(s): AMMONIA in the last 168 hours. Coagulation Profile: Recent Labs  Lab 02/08/22 0542 02/09/22 0440  INR 1.2 1.2   Cardiac Enzymes: No results for input(s): CKTOTAL, CKMB, CKMBINDEX, TROPONINI in the last 168 hours. BNP (last 3 results) No results for input(s): PROBNP in the last 8760 hours. HbA1C: No results for input(s): HGBA1C in the last 72 hours. CBG: Recent Labs  Lab 02/11/22 1107 02/11/22 1603 02/11/22 2016 02/12/22 0832 02/12/22 1218  GLUCAP 198* 174* 159* 196* 178*   Lipid Profile: No results for input(s): CHOL, HDL, LDLCALC, TRIG, CHOLHDL, LDLDIRECT in the last 72 hours. Thyroid Function Tests: No results for input(s): TSH, T4TOTAL, FREET4, T3FREE, THYROIDAB in the last 72 hours. Anemia Panel: No results for input(s): VITAMINB12, FOLATE, FERRITIN, TIBC, IRON, RETICCTPCT in the last 72 hours. Sepsis Labs: Recent Labs  Lab 02/08/22 0542 02/09/22 0440  PROCALCITON 0.46 0.30  LATICACIDVEN 1.7  --     Recent Results (from the past 240 hour(s))  Resp Panel by RT-PCR (Flu A&B, Covid) Nasopharyngeal Swab     Status: None   Collection Time: 02/08/22  5:42 AM   Specimen: Nasopharyngeal Swab; Nasopharyngeal(NP) swabs in vial transport medium  Result Value Ref Range Status   SARS Coronavirus 2 by RT PCR NEGATIVE NEGATIVE Final    Comment: (NOTE) SARS-CoV-2 target nucleic acids are NOT DETECTED.  The SARS-CoV-2 RNA is generally detectable in upper respiratory specimens during the acute phase of infection. The lowest concentration of SARS-CoV-2 viral copies this assay can detect is 138 copies/mL. A negative result does not preclude SARS-Cov-2 infection and should not be used as the sole basis for treatment or other patient management decisions. A  negative result may occur with  improper specimen collection/handling, submission  of specimen other than nasopharyngeal swab, presence of viral mutation(s) within the areas targeted by this assay, and inadequate number of viral copies(<138 copies/mL). A negative result must be combined with clinical observations, patient history, and epidemiological information. The expected result is Negative.  Fact Sheet for Patients:  EntrepreneurPulse.com.au  Fact Sheet for Healthcare Providers:  IncredibleEmployment.be  This test is no t yet approved or cleared by the [place] FDA and  has been authorized for detection and/or diagnosis of SARS-CoV-2 by FDA under an Emergency Use Authorization (EUA). This EUA will remain  in effect (meaning this test can be used) for the duration of the COVID-19 declaration under Section 564(b)(1) of the Act, 21 U.S.C.section 360bbb-3(b)(1), unless the authorization is terminated  or revoked sooner.       Influenza A by PCR NEGATIVE NEGATIVE Final   Influenza B by PCR NEGATIVE NEGATIVE Final    Comment: (NOTE) The Xpert Xpress SARS-CoV-2/FLU/RSV plus assay is intended as an aid in the diagnosis of influenza from Nasopharyngeal swab specimens and should not be used as a sole basis for treatment. Nasal washings and aspirates are unacceptable for Xpert Xpress SARS-CoV-2/FLU/RSV testing.  Fact Sheet for Patients: EntrepreneurPulse.com.au  Fact Sheet for Healthcare Providers: IncredibleEmployment.be  This test is not yet approved or cleared by the [place] FDA and has been authorized for detection and/or diagnosis of SARS-CoV-2 by FDA under an Emergency Use Authorization (EUA). This EUA will remain in effect (meaning this test can be used) for the duration of the COVID-19 declaration under Section 564(b)(1) of the Act, 21 U.S.C. section 360bbb-3(b)(1), unless the authorization  is terminated or revoked.  Performed at [facility], [address] [place], [place] [address]   Blood Culture (routine x 2)     Status: None (Preliminary result)   Collection Time: 02/08/22  5:42 AM   Specimen: BLOOD RIGHT FOREARM  Result Value Ref Range Status   Specimen Description BLOOD RIGHT FOREARM  Final   Special Requests   Final    BOTTLES DRAWN AEROBIC AND ANAEROBIC Blood Culture adequate volume   Culture   Final    NO GROWTH 4 DAYS Performed at [facility], [address], [place], [place] [address]    Report Status PENDING  Incomplete  Blood Culture (routine x 2)     Status: Abnormal   Collection Time: 02/08/22  5:42 AM   Specimen: BLOOD  Result Value Ref Range Status   Specimen Description   Final    BLOOD RIGHT ANTECUBITAL Performed at [facility], [place] [address], [place], [place] [address]    Special Requests   Final    BOTTLES DRAWN AEROBIC AND ANAEROBIC Blood Culture adequate volume Performed at [facility], [address] [place], [place] [address]    Culture  Setup Time   Final    GRAM POSITIVE COCCI AEROBIC BOTTLE ONLY CRITICAL RESULT CALLED TO, READ BACK BY AND VERIFIED WITH: JASON BELUE PHARMD AT 0330 02/09/2022 GA    Culture (A)  Final    STAPHYLOCOCCUS HOMINIS THE SIGNIFICANCE OF ISOLATING THIS ORGANISM FROM A SINGLE SET OF BLOOD CULTURES WHEN MULTIPLE SETS ARE DRAWN IS UNCERTAIN. PLEASE NOTIFY THE MICROBIOLOGY DEPARTMENT WITHIN ONE WEEK IF SPECIATION AND SENSITIVITIES ARE REQUIRED. Performed at [facility], [place] [address] [place], [place] [address]    Report Status 02/11/2022 FINAL  Final  Urine Culture     Status: None   Collection Time: 02/08/22  5:42 AM  Specimen: In/Out Cath Urine  Result Value Ref Range Status   Specimen Description   Final    IN/OUT CATH URINE Performed at [facility], [address] [place], [place] [address]    Special Requests   Final    NONE Performed  at [facility], [address] [place], [place] [address]    Culture   Final    NO GROWTH Performed at [facility], [place] [address], [place], [place] [address]    Report Status 02/09/2022 FINAL  Final  Blood Culture ID Panel (Reflexed)     Status: Abnormal   Collection Time: 02/08/22  5:42 AM  Result Value Ref Range Status   Enterococcus faecalis NOT DETECTED NOT DETECTED Final   Enterococcus Faecium NOT DETECTED NOT DETECTED Final   Listeria monocytogenes NOT DETECTED NOT DETECTED Final   Staphylococcus species DETECTED (A) NOT DETECTED Final    Comment: CRITICAL RESULT CALLED TO, READ BACK BY AND VERIFIED WITH: JASON BELUE PHARMD AT 0330 02/09/2022 GA    Staphylococcus aureus (BCID) NOT DETECTED NOT DETECTED Final   Staphylococcus epidermidis NOT DETECTED NOT DETECTED Final   Staphylococcus lugdunensis NOT DETECTED NOT DETECTED Final   Streptococcus species NOT DETECTED NOT DETECTED Final   Streptococcus agalactiae NOT DETECTED NOT DETECTED Final   Streptococcus pneumoniae NOT DETECTED NOT DETECTED Final   Streptococcus pyogenes NOT DETECTED NOT DETECTED Final   A.calcoaceticus-baumannii NOT DETECTED NOT DETECTED Final   Bacteroides fragilis NOT DETECTED NOT DETECTED Final   Enterobacterales NOT DETECTED NOT DETECTED Final   Enterobacter cloacae complex NOT DETECTED NOT DETECTED Final   Escherichia coli NOT DETECTED NOT DETECTED Final   Klebsiella aerogenes NOT DETECTED NOT DETECTED Final   Klebsiella oxytoca NOT DETECTED NOT DETECTED Final   Klebsiella pneumoniae NOT DETECTED NOT DETECTED Final   Proteus species NOT DETECTED NOT DETECTED Final   Salmonella species NOT DETECTED NOT DETECTED Final   Serratia marcescens NOT DETECTED NOT DETECTED Final   Haemophilus influenzae NOT DETECTED NOT DETECTED Final   Neisseria meningitidis NOT DETECTED NOT DETECTED Final   Pseudomonas aeruginosa NOT DETECTED NOT DETECTED Final   Stenotrophomonas maltophilia NOT DETECTED  NOT DETECTED Final   Candida albicans NOT DETECTED NOT DETECTED Final   Candida auris NOT DETECTED NOT DETECTED Final   Candida glabrata NOT DETECTED NOT DETECTED Final   Candida krusei NOT DETECTED NOT DETECTED Final   Candida parapsilosis NOT DETECTED NOT DETECTED Final   Candida tropicalis NOT DETECTED NOT DETECTED Final   Cryptococcus neoformans/gattii NOT DETECTED NOT DETECTED Final    Comment: Performed at [facility], [address] [place], [place] [address]  MRSA Next Gen by PCR, Nasal     Status: None   Collection Time: 02/08/22  2:35 PM   Specimen: Nasal Mucosa; Nasal Swab  Result Value Ref Range Status   MRSA by PCR Next Gen NOT DETECTED NOT DETECTED Final    Comment: (NOTE) The GeneXpert MRSA Assay (FDA approved for NASAL specimens only), is one component of a comprehensive MRSA colonization surveillance program. It is not intended to diagnose MRSA infection nor to guide or monitor treatment for MRSA infections. Test performance is not FDA approved in patients less than 89 years old. Performed at [facility], [address] [place], [place] [address]   Aerobic/Anaerobic Culture w Gram Stain (surgical/deep wound)     Status: None (Preliminary result)   Collection Time: 02/11/22  3:34 PM   Specimen: Abscess; Body Fluid  Result Value Ref Range Status   Specimen Description   Final    ABSCESS Performed at [facility], [address] [place], [place] [address]    Special Requests LEFT BREAST ASPIRATION  Final   Gram Stain   Final    FEW WBC PRESENT,BOTH PMN AND MONONUCLEAR NO ORGANISMS SEEN    Culture   Final    NO GROWTH < 12 HOURS Performed at [facility], [place] [address] [place], [place] [address]    Report Status PENDING  Incomplete    Radiology Studies: US BREAST LTD UNI LEFT INC AXILLA  Result Date: 02/11/2022 CLINICAL DATA:  Patient is status post lumpectomy left breast in 2022 with a known left breast seroma.  Patient has been admitted for sepsis. The patient's physician requested ultrasound scanning of the patient's known left breast seroma. EXAM: ULTRASOUND OF THE LEFT BREAST COMPARISON:  December 05, 2021 FINDINGS: Targeted ultrasound is performed, showing stable seroma at the left breast upper-outer quadrant measuring maximum 10.9 cm unchanged compared to prior ultrasound. IMPRESSION: Benign findings. RECOMMENDATION: Bilateral diagnostic mammogram back on schedule. I have discussed the findings and recommendations with the patient. If applicable, a reminder letter will be sent to the patient regarding the next appointment. BI-RADS CATEGORY  2: Benign. Electronically Signed   By: Abelardo Diesel M.D.   On: 02/11/2022 09:52   US BREAST ASPIRATION LEFT  Result Date: 02/11/2022 INDICATION: Left breast fluid collection following lumpectomy, concern for secondary infection EXAM: Ultrasound-guided drainage of left breast fluid collection MEDICATIONS: None. ANESTHESIA/SEDATION: Local analgesia FLUOROSCOPY TIME:  N/a COMPLICATIONS: None immediate. PROCEDURE: Informed written consent was obtained from the patient after a thorough discussion of the procedural risks, benefits and alternatives. All questions were addressed. Maximal Sterile Barrier Technique was utilized including caps, mask, sterile gowns, sterile gloves, sterile drape, hand hygiene and skin antiseptic. A timeout was performed prior to the initiation of the procedure. The patient was placed supine on the exam table. Limited ultrasound of the left breast was performed in the area of interest. This again demonstrated a mixed echogenicity predominantly hypoechoic fluid collection with internal septations. Comparison with previous imaging suggest an evolving and liquified hematoma. Skin entry site was marked, and the overlying skin was prepped and draped in a standard sterile fashion. Local analgesia was obtained with 1% lidocaine. Using ultrasound guidance, a 19  gauge Yueh catheter was advanced into the largest anechoic pocket within the identified collection. There was immediate return of thin dark red/brown fluid. A total of approximately 125 mL of this fluid was aspirated, and sent to the lab for analysis. Postprocedure imaging demonstrates appropriate decompression of the anechoic fluid components, with residual more inflammatory debris remaining. The patient tolerated all aspects of the procedure well without immediate complication. A clean dressing was placed. The patient was transferred in stable condition. IMPRESSION: Successful ultrasound-guided drainage of the left breast fluid collection with aspiration of 125 mL of thin dark red/brown fluid. A sample was sent to the lab for analysis. Imaging and aspirate findings are most suggestive of a liquified hematoma. Attention on microbiology studies for any findings of infection. Electronically Signed   By: Albin Felling M.D.   On: 02/11/2022 15:30    Scheduled Meds:  apixaban  5 mg Oral BID   aspirin  81 mg Oral Daily   atorvastatin  80 mg Oral QPM   Chlorhexidine Gluconate Cloth  6 each Topical Daily   diltiazem  240 mg Oral Daily   dorzolamide-timolol  1 drop Both Eyes BID   fesoterodine  4 mg Oral Daily   fluticasone  2 spray Each Nare Daily   insulin aspart  0-15 Units Subcutaneous TID WC   latanoprost  1 drop Both Eyes QHS   letrozole  2.5 mg Oral Daily   loratadine  10 mg Oral Daily   metoprolol tartrate  100 mg Oral BID   multivitamin with minerals  1 tablet Oral Daily   pantoprazole  40 mg Oral Daily   polyvinyl alcohol  1 drop Both Eyes Daily   pregabalin  100 mg Oral TID   sertraline  100 mg Oral Daily   Continuous Infusions:  amiodarone 30 mg/hr (02/12/22 0617)    ceFAZolin (ANCEF) IV 2 g (02/12/22 1332)     LOS: 4 days    Time spent: 50 mins    Quashaun Lazalde, MD [facility]   If 7PM-7AM, please contact night-coverage

## 2022-02-13 ENCOUNTER — Encounter: Payer: Self-pay | Admitting: Anesthesiology

## 2022-02-13 ENCOUNTER — Inpatient Hospital Stay
Admit: 2022-02-13 | Discharge: 2022-02-13 | Disposition: A | Discharge status: Home / Self Care | Payer: Medicare HMO | Attending: Internal Medicine | Admitting: Internal Medicine

## 2022-02-13 ENCOUNTER — Encounter
Admission: EM | Disposition: A | Discharge status: Skilled Nursing Facility | Payer: Self-pay | Source: Home / Self Care | Attending: Family Medicine

## 2022-02-13 DIAGNOSIS — A419 Sepsis, unspecified organism: Secondary | ICD-10-CM | POA: Diagnosis not present

## 2022-02-13 LAB — BASIC METABOLIC PANEL
Anion gap: 7 (ref 5–15)
BUN: 16 mg/dL (ref 8–23)
CO2: 32 mmol/L (ref 22–32)
Calcium: 8.7 mg/dL — ABNORMAL LOW (ref 8.9–10.3)
Chloride: 97 mmol/L — ABNORMAL LOW (ref 98–111)
Creatinine, Ser: 0.55 mg/dL (ref 0.44–1.00)
GFR, Estimated: >60 mL/min (ref >60)
Glucose, Bld: 214 mg/dL — ABNORMAL HIGH (ref 70–99)
Potassium: 4.2 mmol/L (ref 3.5–5.1)
Sodium: 136 mmol/L (ref 135–145)

## 2022-02-13 LAB — CBC
HCT: 31.8 % — ABNORMAL LOW (ref 36.0–46.0)
Hemoglobin: 10 g/dL — ABNORMAL LOW (ref 12.0–15.0)
MCH: 27.3 pg (ref 26.0–34.0)
MCHC: 31.4 g/dL (ref 30.0–36.0)
MCV: 86.9 fL (ref 80.0–100.0)
Platelets: 257 10*3/uL (ref 150–400)
RBC: 3.66 MIL/uL — ABNORMAL LOW (ref 3.87–5.11)
RDW: 15.1 % (ref 11.5–15.5)
WBC: 7.9 10*3/uL (ref 4.0–10.5)
nRBC: 0 % (ref 0.0–0.2)

## 2022-02-13 LAB — MAGNESIUM: Magnesium: 2.2 mg/dL (ref 1.7–2.4)

## 2022-02-13 LAB — GLUCOSE, CAPILLARY
Glucose-Capillary: 198 mg/dL — ABNORMAL HIGH (ref 70–99)
Glucose-Capillary: 195 mg/dL — ABNORMAL HIGH (ref 70–99)
Glucose-Capillary: 162 mg/dL — ABNORMAL HIGH (ref 70–99)
Glucose-Capillary: 230 mg/dL — ABNORMAL HIGH (ref 70–99)

## 2022-02-13 LAB — CULTURE, BLOOD (ROUTINE X 2)
Culture: NO GROWTH
Special Requests: ADEQUATE

## 2022-02-13 SURGERY — ECHOCARDIOGRAM, TRANSESOPHAGEAL
Anesthesia: Moderate Sedation

## 2022-02-13 MED ORDER — POLYETHYLENE GLYCOL 3350 17 G PO PACK
17.0000 g | PACK | Freq: Two times a day (BID) | ORAL | Status: DC | PRN
  Administered 2022-02-13 – 2022-02-17 (×3): 17 g via ORAL
  Filled 2022-02-13 (×3): qty 1

## 2022-02-13 MED ORDER — INSULIN GLARGINE-YFGN 100 UNIT/ML ~~LOC~~ SOLN
5.0000 [IU] | Freq: Every day | SUBCUTANEOUS | Status: DC
  Administered 2022-02-13 – 2022-02-16 (×4): 5 [IU] via SUBCUTANEOUS
  Filled 2022-02-13 (×4): qty 0.05

## 2022-02-13 MED ORDER — SENNOSIDES-DOCUSATE SODIUM 8.6-50 MG PO TABS
2.0000 | ORAL_TABLET | Freq: Two times a day (BID) | ORAL | Status: DC
Start: 2022-02-13 — End: 2022-02-18
  Administered 2022-02-13 – 2022-02-18 (×10): 2 via ORAL
  Filled 2022-02-13 (×10): qty 2

## 2022-02-13 MED ORDER — SODIUM CHLORIDE 0.9 % IV SOLN: INTRAVENOUS | Status: DC

## 2022-02-13 NOTE — Progress Notes (Signed)
Plan for TEE/Cardioversion on today at 3 pm to be performed in [facility] by Dr. Serafina Royals. [facility] is aware and has held the 3 pm slot.  The patient and family (her brother and sister-in-law) were thoroughly educated on the indications for the procedures, details of the procedure, the risks and the benefits with potential outcomes. They all verbalized understanding and are in agreement to proceed with the TEE/cardioversion on today.

## 2022-02-13 NOTE — Progress Notes (Signed)
°   02/11/22 1616  Assess: MEWS Score  Temp (!) 100.7 F (38.2 C)  BP 112/80  Pulse Rate (!) 147  ECG Heart Rate (!) 147  Resp 15  SpO2 92 %  O2 Device Nasal Cannula  O2 Flow Rate (L/min) 2 L/min  Assess: MEWS Score  MEWS Temp 1  MEWS Systolic 0  MEWS Pulse 3  MEWS RR 0  MEWS LOC 0  MEWS Score 4  MEWS Score Color Red  Assess: if the MEWS score is Yellow or Red  Were vital signs taken at a resting state? Yes  Focused Assessment No change from prior assessment  Does the patient meet 2 or more of the SIRS criteria? No  MEWS guidelines implemented *See Row Information* Yes  Treat  MEWS Interventions Escalated (See documentation below)  Take Vital Signs  Increase Vital Sign Frequency  Red: Q 1hr X 4 then Q 4hr X 4, if remains red, continue Q 4hrs  Escalate  MEWS: Escalate Red: discuss with charge nurse/RN and provider, consider discussing with RRT  Notify: Charge Nurse/RN  Name of Charge Nurse/RN Notified Linard Millers  Date Charge Nurse/RN Notified 02/11/22  Time Charge Nurse/RN Notified 1645  Notify: Provider  Provider Name/Title [place]  Date Provider Notified 02/11/22  Time Provider Notified 1630  Notification Type Page  Notify: Rapid Response  Name of Rapid Response RN Notified Megan RN  Date Rapid Response Notified 02/11/22  Time Rapid Response Notified [id number]  Assess: SIRS CRITERIA  SIRS Temperature  0  SIRS Pulse 1  SIRS Respirations  0  SIRS WBC 0  SIRS Score Sum  1

## 2022-02-13 NOTE — Progress Notes (Signed)
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 Cardiology consulted for new onset atrial fibrillation with RVR.    SUBJECTIVE: The patient is feeling "the same" on today and she continues to deny having any chest pain, palpitations, or dyspnea at this time. She rested well overnight.   02/12/22: The patient is feeling "okay" and she denies having any cardiac symptoms at this time. Her previous symptoms of left breast pain has now resolved. She is breathing well on the 2L of supplemental oxygen.   OBJECTIVE:   Vitals:   02/12/22 2333 02/13/22 0416 02/13/22 0500 02/13/22 0756  BP: 106/66 93/68 107/70 122/71  Pulse: (!) 146 (!) 145  (!) 144  Resp: 18 18  18   Temp: 97.9 F (36.6 C) 98.8 F (37.1 C)  (!) 97.5 F (36.4 C)  TempSrc:      SpO2: 93% 98%  97%  Weight:      Height:         Intake/Output Summary (Last 24 hours) at 02/13/2022 0802 Last data filed at 02/12/2022 2121 Gross per 24 hour  Intake 1065.8 ml  Output 850 ml  Net 215.8 ml      PHYSICAL EXAM  General: Well developed, well nourished, in no acute distress HEENT:  Normocephalic and atraumatic. PERRL Neck:  No JVD.  Lungs: Clear bilaterally in upper lobes, diminished breath sounds to lower lobes upon auscultation. Chest expansion symmetrical. No wheezes, rales, crackles or rhonchi.  Heart: HR regular but notably fast. Normal S1 and S2 without gallops or murmurs.  Abdomen: distended. Bowel sounds are positive, abdomen soft and non-tender. Msk:  Normal strength and tone for age. Extremities: No clubbing, cyanosis or edema.   Neuro: Alert and oriented X 3. Psych:  Good affect, responds  appropriately   LABS: Basic Metabolic Panel: Recent Labs    02/12/22 0612 02/13/22 0625  NA 137 136  K 4.0 4.2  CL 98 97*  CO2 28 32  GLUCOSE 205* 214*  BUN 13 16  CREATININE 0.54 0.55  CALCIUM 8.7* 8.7*  MG 2.0 2.2   Liver Function Tests: No results for input(s): AST, ALT, ALKPHOS, BILITOT, PROT, ALBUMIN in the last 72 hours. No results for input(s): LIPASE, AMYLASE in the last 72 hours. CBC: Recent Labs    02/12/22 0612 02/13/22 0625  WBC 8.2 7.9  HGB 9.9* 10.0*  HCT 31.2* 31.8*  MCV 86.9 86.9  PLT 247 257   Cardiac Enzymes: No results for input(s): CKTOTAL, CKMB, CKMBINDEX, TROPONINI in the last 72 hours. BNP: Invalid input(s): POCBNP D-Dimer: No results for input(s): DDIMER in the last 72 hours. Hemoglobin A1C: No results for input(s): HGBA1C in the last 72 hours. Fasting Lipid Panel: No results for input(s): CHOL, HDL, LDLCALC, TRIG, CHOLHDL, LDLDIRECT in the last 72 hours. Thyroid Function Tests: No results for input(s): TSH, T4TOTAL, T3FREE, THYROIDAB in the last 72 hours.  Invalid input(s): FREET3 Anemia Panel: No results for input(s): VITAMINB12, FOLATE, FERRITIN, TIBC, IRON, RETICCTPCT in the last 72 hours.  US BREAST LTD UNI LEFT INC AXILLA  Result Date: 02/11/2022 CLINICAL DATA:  Patient is status post lumpectomy left breast in 2022 with a known left breast seroma. Patient has been admitted  for sepsis. The patient's physician requested ultrasound scanning of the patient's known left breast seroma. EXAM: ULTRASOUND OF THE LEFT BREAST COMPARISON:  December 05, 2021 FINDINGS: Targeted ultrasound is performed, showing stable seroma at the left breast upper-outer quadrant measuring maximum 10.9 cm unchanged compared to prior ultrasound. IMPRESSION: Benign findings. RECOMMENDATION: Bilateral diagnostic mammogram back on schedule. I have discussed the findings and recommendations with the patient. If applicable, a reminder letter will be sent to the patient  regarding the next appointment. BI-RADS CATEGORY  2: Benign. Electronically Signed   By: Abelardo Diesel M.D.   On: 02/11/2022 09:52   US BREAST ASPIRATION LEFT  Result Date: 02/11/2022 INDICATION: Left breast fluid collection following lumpectomy, concern for secondary infection EXAM: Ultrasound-guided drainage of left breast fluid collection MEDICATIONS: None. ANESTHESIA/SEDATION: Local analgesia FLUOROSCOPY TIME:  N/a COMPLICATIONS: None immediate. PROCEDURE: Informed written consent was obtained from the patient after a thorough discussion of the procedural risks, benefits and alternatives. All questions were addressed. Maximal Sterile Barrier Technique was utilized including caps, mask, sterile gowns, sterile gloves, sterile drape, hand hygiene and skin antiseptic. A timeout was performed prior to the initiation of the procedure. The patient was placed supine on the exam table. Limited ultrasound of the left breast was performed in the area of interest. This again demonstrated a mixed echogenicity predominantly hypoechoic fluid collection with internal septations. Comparison with previous imaging suggest an evolving and liquified hematoma. Skin entry site was marked, and the overlying skin was prepped and draped in a standard sterile fashion. Local analgesia was obtained with 1% lidocaine. Using ultrasound guidance, a 19 gauge Yueh catheter was advanced into the largest anechoic pocket within the identified collection. There was immediate return of thin dark red/brown fluid. A total of approximately 125 mL of this fluid was aspirated, and sent to the lab for analysis. Postprocedure imaging demonstrates appropriate decompression of the anechoic fluid components, with residual more inflammatory debris remaining. The patient tolerated all aspects of the procedure well without immediate complication. A clean dressing was placed. The patient was transferred in stable condition. IMPRESSION: Successful  ultrasound-guided drainage of the left breast fluid collection with aspiration of 125 mL of thin dark red/brown fluid. A sample was sent to the lab for analysis. Imaging and aspirate findings are most suggestive of a liquified hematoma. Attention on microbiology studies for any findings of infection. Electronically Signed   By: Albin Felling M.D.   On: 02/11/2022 15:30    TELEMETRY: appears to be ST with HR of 142 bpm; however, atrial flutter is also suspected.   ASSESSMENT AND PLAN:  Principal Problem:   Sepsis ([place]) Active Problems:   Carcinoma of upper-outer quadrant of female breast, left ([place])   Type 2 diabetes mellitus without complications ([place])   Essential (primary) hypertension   Sleep apnea   Morbid obesity ([place])   Cellulitis of left breast   Atrial fibrillation with RVR ([place])    # Atrial flutter versus ST  The patient was found to be in atrial fibrillation with RVR upon admission on 2/18. She converted spontaneously to NSR but went back into a-fib with RVR on 2/20. On 2/21 the patient appeared to be in [place] with concerns for atrial flutter. On 2/22 she was given 5 mg of IV metoprolol in addition to oral metoprolol that was increased to 100mg  twice daily; however, this did not achieve rate control. Subsequently she was given 12mg  of IV adenosine which revealed an underlying rhythm of SB which was short-lived and  the patient went back into ST with a HR of 143 bpm. Later that same afternoon she was given IV digoxin which also did not improve the rate. TEE/Cardioversion is indicated at this time. The patient continues to be in [place] with a HR of 142 bpm per telemetry review at this time; however,rhythm continues to be suspicious for atrial flutter. The patient remains asymptomatic at this time. The patient has a CHADsVASc score of 5. She has been on eliquis for anticoagulation.    - Plan for TEE with Cardioversion on today at 3 pm to be performed by Dr. Lelon Mast.  Anaesthesilogy and OR  is aware.   - NPO at this time.   - continue metoprolol tartrate to 100mg  by mouth twice daily.   - continue amiodarone gtt at this time.    - continue diltiazem.   - continue eliquis for anticoagulation and stroke risk reduction. Eliquis should be continued as outpatient for at least 1 month and then re-evaluated the need per primary cardiologist.   - continuous telemetry monitoring until discharge.    # Severe AS Fairly stable. Patient is currently asymptomatic. Echo revealed severe AS, hyperdynamic LVF with an estimated EF >75%, no regional wall motion abnormalities and mildly dilated LA & RA.  - conservative management at this time.   - CTS evaluation as outpatient.   # COPD exacerbation # Chronic respiratory failure with hypoxia Stable. Patient's dyspnea has improved. She continues to be on 2L of Oxygen via [place] with oxygen saturation >90.  - Agree with current management per hospitalist team.   - RT evaluation appreciated.    # Sepsis from left breast cellulitis s/p [place] left breast aspiration of fluid # Stage Ia invasive mammary carcinoma s/p lumpectomy and radiation  - Management per hospitality, oncology and radiology team.   #HLD  - continue atorvastatin.   #HTN Reasonably controlled.  - beta blocker and calcium channel blocker as above.   - heart healthy diet.    #DM2 Reasonably controlled.    - Agree with current management with SSI.      Keric Zehren, ACNPC-AG  02/13/2022 8:02 AM

## 2022-02-13 NOTE — Anesthesia Preprocedure Evaluation (Deleted)
Anesthesia Evaluation  Patient identified by MRN, date of birth, ID band Patient awake    Reviewed: Allergy & Precautions, NPO status , Patient's Chart, lab work & pertinent test results  History of Anesthesia Complications Negative for: history of anesthetic complications  Airway Mallampati: III  TM Distance: >3 FB Neck ROM: Full    Dental  (+) Poor Dentition, Missing   Pulmonary sleep apnea and Oxygen sleep apnea , neg COPD,  Requires supplemental oxygen 2L at night   breath sounds clear to auscultation- rhonchi (-) wheezing      Cardiovascular hypertension, Pt. on medications (-) CAD, (-) Past MI, (-) Cardiac Stents and (-) CABG + Valvular Problems/Murmurs (Mild mitral regurgitation, Mild aortic stenosis, Mild tricuspid regurgitation)  Rhythm:Regular Rate:Normal - Systolic murmurs and - Diastolic murmurs    Neuro/Psych neg Seizures Depression Opioid use for Dx of chronic pain syndrome negative neurological ROS     GI/Hepatic Neg liver ROS, GERD  ,Gastroparesis due to secondary diabetes   Endo/Other  diabetes, Oral Hypoglycemic AgentsMorbid obesity  Renal/GU negative Renal ROS     Musculoskeletal  (+) Arthritis ,   Abdominal (+) + obese,   Peds  Hematology  (+) Blood dyscrasia, anemia ,   Anesthesia Other Findings Pt has Sepsis from left breast cellulitis that likely triggered Atrial fibrillation with RVR.  Past Medical History: No date: Anemia No date: Anginal pain (HCC) No date: Arthritis No date: Chronic pain syndrome No date: Degenerative disc disease, lumbar No date: Edema of left lower extremity No date: Gastroparesis due to secondary diabetes ([place]) No date: GERD (gastroesophageal reflux disease) No date: Heart murmur No date: History of seizure disorder No date: Hyperlipidemia No date: Hypertension 04/25/2021: Invasive carcinoma of breast ([place])     Comment:  LEFT; stage 1A; grade I invasive  mammary carcinoma;               ER/PR (+); HER2/neu (-) No date: Mild aortic stenosis No date: Mild mitral regurgitation No date: Mild tricuspid regurgitation No date: Morbid obesity (HCC) No date: Opioid use     Comment:  therapeutic use for Dx of chronic pain syndrome No date: OSA (obstructive sleep apnea) No date: Recurrent falls while walking     Comment:  uses rolling walker  No date: Requires supplemental oxygen     Comment:  2L/[place] at bedtime No date: T2DM (type 2 diabetes mellitus) ([place]) No date: Tubular adenoma of colon   Reproductive/Obstetrics                             Anesthesia Physical  Anesthesia Plan  ASA: III  Anesthesia Plan: General   Post-op Pain Management:    Induction: Intravenous  PONV Risk Score and Plan: 2 and TIVA and Treatment may vary due to age or medical condition  Airway Management Planned: Natural Airway and Simple Face Mask  Additional Equipment:   Intra-op Plan:   Post-operative Plan:   Informed Consent:   Plan Discussed with: CRNA and Anesthesiologist  Anesthesia Plan Comments:         Anesthesia Quick Evaluation

## 2022-02-13 NOTE — Progress Notes (Signed)
PROGRESS NOTE    Rhonda Thornton  NAT:[id number] DOB: 06/16/1951 DOA: 02/08/2022 PCP: Denton Lank, MD   Brief Narrative:  Rhonda Thornton is a 71 y.o. 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 Patient states that she has not felt well for the last 3 days and has been weak, fatigued, has had myalgias as well as fever and chills. She called EMS and when they arrived,  She was found to be in A-fib with a rapid ventricular rate with heart rates between 160 and 180. She complains of feeling very sore in her left breast and has had nausea.  She continues to remain in A-fib with RVR despite being on multiple interventions.  Cardiology has decided TEE with cardioversion today.  Assessment & Plan:   Principal Problem:   Sepsis ([place]) Active Problems:   Carcinoma of upper-outer quadrant of female breast, left ([place])   Type 2 diabetes mellitus without complications ([place])   Essential (primary) hypertension   Sleep apnea   Morbid obesity ([place])   Cellulitis of left breast   Atrial fibrillation with RVR ([facility])  Sepsis from left breast cellulitis: She presented with fever, tachycardia, tachypnea, leukocytosis and left breast cellulitis Patient has  known large seroma in her left breast.   Imaging did not show any evidence of an abscess at this time. She underwent ultrasound-guided aspiration by IR. 125 ml dark red/brown fluid removed and sent for culture. Empirically initiated on vancomycin and ceftriaxone and then broadened to Zosyn. Now narrowed down to Ancef as per ID pharmacy. Sepsis physiology improving.   Stage Ia invasive Breast carcinoma status post lumpectomy and whole breast radiation of her left breast.   Known large seroma Continue Femara  Informed Dr. Grayland Ormond , pt will follow up as outpatient. Informed Dr. Christian Mate about the seroma, who recommended fluid aspiration. She underwent ultrasound-guided aspiration by IR. 125 ml dark red/brown fluid removed and sent for culture.   Atrial fibrillation with RVR: Likely triggered by sepsis, started on Lovenox for stroke prophylaxis. Cardiology consulted Dr. Nehemiah Massed. Heart rate was controlled and converted but now back to A-fib with RVR again. Currently HR still not controlled. Continue Cardizem 240 mg daily Increased Lopressor 100 mg twice daily Continue amiodarone gtt. Continue Eliquis. HR continued to remain uncontrolled despite being multiple interventions. Cardiology has recommended to do TEE with cardioversion today  Diabetes mellitus: Continue regular insulin sliding scale.   Depression Continue Zoloft   Morbid obesity (BMI 41) Complicates overall prognosis and care.   Chronic hypoxic respiratory failure. Continue supplemental oxygen.   Chronic pain on chronic opioids Continue Norco as needed.   DVT prophylaxis: Eliquis Code Status: Full code Family Communication: No family at bedside Disposition Plan:  Status is: Inpatient Remains inpatient appropriate because:  Admitted for sepsis secondary to cellulitis on IV antibiotics and has developed A-fib with RVR which is uncontrolled. Cardiology has a scheduled TEE with cardioversion today.  Consultants:  Cardiology  Procedures: Ultrasound-guided aspiration of breast seroma Antimicrobials: ( Anti-infectives (From admission, onward)    Start     Dose/Rate Route Frequency Ordered Stop   02/12/22 0600  ceFAZolin (ANCEF) IVPB 1 g/50 mL premix  Status:  Discontinued        1 g 100  mL/hr over 30 Minutes Intravenous Every 12 hours 02/11/22 1156 02/11/22 1703   02/12/22 0600  ceFAZolin (ANCEF) IVPB 2g/100 mL premix        2 g 200 mL/hr over 30 Minutes Intravenous Every 8  hours 02/11/22 1703     02/09/22 2200  cefTRIAXone (ROCEPHIN) 2 g in sodium chloride 0.9 % 100 mL IVPB  Status:  Discontinued        2 g 200 mL/hr over 30 Minutes Intravenous Every 24 hours 02/09/22 1815 02/11/22 1156   02/09/22 1000  vancomycin (VANCOREADY) IVPB 1250 mg/250 mL  Status:  Discontinued        1,250 mg 166.7 mL/hr over 90 Minutes Intravenous Every 24 hours 02/08/22 1012 02/11/22 1156   02/08/22 1400  ceFEPIme (MAXIPIME) 2 g in sodium chloride 0.9 % 100 mL IVPB  Status:  Discontinued        2 g 200 mL/hr over 30 Minutes Intravenous Every 8 hours 02/08/22 1010 02/08/22 1025   02/08/22 1400  piperacillin-tazobactam (ZOSYN) IVPB 3.375 g  Status:  Discontinued        3.375 g 12.5 mL/hr over 240 Minutes Intravenous Every 8 hours 02/08/22 1025 02/09/22 1815   02/08/22 1000  ceFEPIme (MAXIPIME) 2 g in sodium chloride 0.9 % 100 mL IVPB  Status:  Discontinued        2 g 200 mL/hr over 30 Minutes Intravenous  Once 02/08/22 0944 02/08/22 1010   02/08/22 1000  metroNIDAZOLE (FLAGYL) IVPB 500 mg  Status:  Discontinued        500 mg 100 mL/hr over 60 Minutes Intravenous Every 12 hours 02/08/22 0944 02/08/22 1024   02/08/22 1000  vancomycin (VANCOCIN) IVPB 1000 mg/200 mL premix  Status:  Discontinued        1,000 mg 200 mL/hr over 60 Minutes Intravenous  Once 02/08/22 0944 02/08/22 0957   02/08/22 0545  vancomycin (VANCOREADY) IVPB 2000 mg/400 mL       See Hyperspace for full Linked Orders Report.   2,000 mg 200 mL/hr over 120 Minutes Intravenous  Once 02/08/22 0541 02/08/22 0854   02/08/22 0545  vancomycin (VANCOREADY) IVPB 500 mg/100 mL       See Hyperspace for full Linked Orders Report.   500 mg 100 mL/hr over 60 Minutes Intravenous  Once 02/08/22 0541 02/08/22 1000   02/08/22 0530  cefTRIAXone (ROCEPHIN) 2 g in sodium chloride 0.9 % 100 mL IVPB  Status:  Discontinued        2 g 200 mL/hr over 30 Minutes Intravenous Every 24 hours 02/08/22 0525 02/08/22 1010        Subjective: Patient was seen and examined at bedside.  Overnight events noted.   Patient reports feeling better.  He denies any chest pain or shortness of breath. She reports pain in the left breast has improved. She remains on her baseline oxygen requirement.   Objective: Vitals:   02/12/22 2333 02/13/22 0416 02/13/22 0500 02/13/22 0756  BP: 106/66 93/68 107/70 122/71  Pulse: (!) 146 (!) 145  (!) 144  Resp: 18 18  18   Temp: 97.9 F (36.6 C) 98.8 F (37.1 C)  (!) 97.5 F (36.4 C)  TempSrc:      SpO2: 93% 98%  97%  Weight:      Height:        Intake/Output Summary (Last 24 hours) at 02/13/2022 1443 Last data filed at 02/13/2022 1004 Gross per 24 hour  Intake 945.8 ml  Output 400 ml  Net  545.8 ml   Filed Weights   02/08/22 0536 02/08/22 1441  Weight: 108.4 kg 108.4 kg    Examination:  General exam: Appears comfortable, deconditioned, not in any acute distress. Respiratory system: CTA bilaterally, no crackles, no wheezing, respiratory effort normal. Cardiovascular system: S1 & S2 heard, Irregular rhythm, no murmur.   Gastrointestinal system: Abdomen is soft, non tender, non distended, BS+. Central nervous system: Alert and oriented x 3. No focal neurological deficits. Extremities: No edema, no cyanosis, no clubbing. Skin: No rashes, lesions or ulcers Psychiatry: Judgement and insight appear normal. Mood & affect appropriate.     Data Reviewed: I have personally reviewed following labs and imaging studies  CBC: Recent Labs  Lab 02/08/22 0542 02/09/22 0440 02/10/22 0502 02/11/22 0620 02/12/22 0612 02/13/22 0625  WBC 13.7* 8.9 6.2 6.9 8.2 7.9  NEUTROABS 12.0*  --   --   --   --   --   HGB 11.0* 8.8* 8.1* 9.1* 9.9* 10.0*  HCT 35.0* 28.6* 26.0* 29.3* 31.2* 31.8*  MCV 88.8 89.7 89.0 88.0 86.9 86.9  PLT 173 158 151 186 247 681   Basic Metabolic Panel: Recent Labs  Lab 02/08/22 0935 02/09/22 0440 02/10/22 0502 02/11/22 0620 02/12/22 0612 02/13/22 0625   NA  --  133* 135 136 137 136  K  --  3.5 3.9 3.8 4.0 4.2  CL  --  99 101 99 98 97*  CO2  --  27 27 27 28  32  GLUCOSE  --  175* 179* 188* 205* 214*  BUN  --  20 15 15 13 16   CREATININE  --  0.59 0.62 0.45 0.54 0.55  CALCIUM  --  8.4* 8.1* 8.4* 8.7* 8.7*  MG 1.8  --  1.8 1.8 2.0 2.2   GFR: Estimated Creatinine Clearance: 77.6 mL/min (by C-G formula based on SCr of 0.55 mg/dL). Liver Function Tests: Recent Labs  Lab 02/08/22 0542  AST 18  ALT 14  ALKPHOS 63  BILITOT 1.1  PROT 7.7  ALBUMIN 3.8   No results for input(s): LIPASE, AMYLASE in the last 168 hours. No results for input(s): AMMONIA in the last 168 hours. Coagulation Profile: Recent Labs  Lab 02/08/22 0542 02/09/22 0440  INR 1.2 1.2   Cardiac Enzymes: No results for input(s): CKTOTAL, CKMB, CKMBINDEX, TROPONINI in the last 168 hours. BNP (last 3 results) No results for input(s): PROBNP in the last 8760 hours. HbA1C: No results for input(s): HGBA1C in the last 72 hours. CBG: Recent Labs  Lab 02/12/22 1218 02/12/22 1645 02/12/22 2046 02/13/22 0757 02/13/22 1126  GLUCAP 178* 152* 149* 198* 195*   Lipid Profile: No results for input(s): CHOL, HDL, LDLCALC, TRIG, CHOLHDL, LDLDIRECT in the last 72 hours. Thyroid Function Tests: No results for input(s): TSH, T4TOTAL, FREET4, T3FREE, THYROIDAB in the last 72 hours. Anemia Panel: No results for input(s): VITAMINB12, FOLATE, FERRITIN, TIBC, IRON, RETICCTPCT in the last 72 hours. Sepsis Labs: Recent Labs  Lab 02/08/22 0542 02/09/22 0440  PROCALCITON 0.46 0.30  LATICACIDVEN 1.7  --     Recent Results (from the past 240 hour(s))  Resp Panel by RT-PCR (Flu A&B, Covid) Nasopharyngeal Swab     Status: None   Collection Time: 02/08/22  5:42 AM   Specimen: Nasopharyngeal Swab; Nasopharyngeal(NP) swabs in vial transport medium  Result Value Ref Range Status   SARS Coronavirus 2 by RT PCR NEGATIVE NEGATIVE Final    Comment: (NOTE) SARS-CoV-2 target nucleic  acids are NOT DETECTED.  The SARS-CoV-2  RNA is generally detectable in upper respiratory specimens during the acute phase of infection. The lowest concentration of SARS-CoV-2 viral copies this assay can detect is 138 copies/mL. A negative result does not preclude SARS-Cov-2 infection and should not be used as the sole basis for treatment or other patient management decisions. A negative result may occur with  improper specimen collection/handling, submission of specimen other than nasopharyngeal swab, presence of viral mutation(s) within the areas targeted by this assay, and inadequate number of viral copies(<138 copies/mL). A negative result must be combined with clinical observations, patient history, and epidemiological information. The expected result is Negative.  Fact Sheet for Patients:  EntrepreneurPulse.com.au  Fact Sheet for Healthcare Providers:  IncredibleEmployment.be  This test is no t yet approved or cleared by the [place] FDA and  has been authorized for detection and/or diagnosis of SARS-CoV-2 by FDA under an Emergency Use Authorization (EUA). This EUA will remain  in effect (meaning this test can be used) for the duration of the COVID-19 declaration under Section 564(b)(1) of the Act, 21 U.S.C.section 360bbb-3(b)(1), unless the authorization is terminated  or revoked sooner.       Influenza A by PCR NEGATIVE NEGATIVE Final   Influenza B by PCR NEGATIVE NEGATIVE Final    Comment: (NOTE) The Xpert Xpress SARS-CoV-2/FLU/RSV plus assay is intended as an aid in the diagnosis of influenza from Nasopharyngeal swab specimens and should not be used as a sole basis for treatment. Nasal washings and aspirates are unacceptable for Xpert Xpress SARS-CoV-2/FLU/RSV testing.  Fact Sheet for Patients: EntrepreneurPulse.com.au  Fact Sheet for Healthcare Providers: IncredibleEmployment.be  This  test is not yet approved or cleared by the [place] FDA and has been authorized for detection and/or diagnosis of SARS-CoV-2 by FDA under an Emergency Use Authorization (EUA). This EUA will remain in effect (meaning this test can be used) for the duration of the COVID-19 declaration under Section 564(b)(1) of the Act, 21 U.S.C. section 360bbb-3(b)(1), unless the authorization is terminated or revoked.  Performed at [facility], [address] [place], [place] [address]   Blood Culture (routine x 2)     Status: None   Collection Time: 02/08/22  5:42 AM   Specimen: BLOOD RIGHT FOREARM  Result Value Ref Range Status   Specimen Description BLOOD RIGHT FOREARM  Final   Special Requests   Final    BOTTLES DRAWN AEROBIC AND ANAEROBIC Blood Culture adequate volume   Culture   Final    NO GROWTH 5 DAYS Performed at [facility], [address] [place], [place] [address]    Report Status 02/13/2022 FINAL  Final  Blood Culture (routine x 2)     Status: Abnormal   Collection Time: 02/08/22  5:42 AM   Specimen: BLOOD  Result Value Ref Range Status   Specimen Description   Final    BLOOD RIGHT ANTECUBITAL Performed at [facility], [place] [address] [place], [place] [address]    Special Requests   Final    BOTTLES DRAWN AEROBIC AND ANAEROBIC Blood Culture adequate volume Performed at [facility] [place],  [address]    Culture  Setup Time   Final    GRAM POSITIVE COCCI AEROBIC BOTTLE ONLY CRITICAL RESULT CALLED TO, READ BACK BY AND VERIFIED WITH: JASON BELUE PHARMD AT 0330 02/09/2022 GA    Culture (A)  Final    STAPHYLOCOCCUS HOMINIS THE SIGNIFICANCE OF ISOLATING THIS ORGANISM FROM A SINGLE SET OF BLOOD  CULTURES WHEN MULTIPLE SETS ARE DRAWN IS UNCERTAIN. PLEASE NOTIFY THE MICROBIOLOGY DEPARTMENT WITHIN ONE WEEK IF SPECIATION AND SENSITIVITIES ARE REQUIRED. Performed at [facility], [place] [address], [place],  [place] [address]    Report Status 02/11/2022 FINAL  Final  Urine Culture     Status: None   Collection Time: 02/08/22  5:42 AM   Specimen: In/Out Cath Urine  Result Value Ref Range Status   Specimen Description   Final    IN/OUT CATH URINE Performed at [facility], [address] [place], [place] [address]    Special Requests   Final    NONE Performed at [facility], [address] [place], [place] [address]    Culture   Final    NO GROWTH Performed at [facility], [place] [address], [place], [place] [address]    Report Status 02/09/2022 FINAL  Final  Blood Culture ID Panel (Reflexed)     Status: Abnormal   Collection Time: 02/08/22  5:42 AM  Result Value Ref Range Status   Enterococcus faecalis NOT DETECTED NOT DETECTED Final   Enterococcus Faecium NOT DETECTED NOT DETECTED Final   Listeria monocytogenes NOT DETECTED NOT DETECTED Final   Staphylococcus species DETECTED (A) NOT DETECTED Final    Comment: CRITICAL RESULT CALLED TO, READ BACK BY AND VERIFIED WITH: JASON BELUE PHARMD AT 0330 02/09/2022 GA    Staphylococcus aureus (BCID) NOT DETECTED NOT DETECTED Final   Staphylococcus epidermidis NOT DETECTED NOT DETECTED Final   Staphylococcus lugdunensis NOT DETECTED NOT DETECTED Final   Streptococcus species NOT DETECTED NOT DETECTED Final   Streptococcus agalactiae NOT DETECTED NOT DETECTED Final   Streptococcus pneumoniae NOT DETECTED NOT DETECTED Final   Streptococcus pyogenes NOT DETECTED NOT DETECTED Final   A.calcoaceticus-baumannii NOT DETECTED NOT DETECTED Final   Bacteroides fragilis NOT DETECTED NOT DETECTED Final   Enterobacterales NOT DETECTED NOT DETECTED Final   Enterobacter cloacae complex NOT DETECTED NOT DETECTED Final   Escherichia coli NOT DETECTED NOT DETECTED Final   Klebsiella aerogenes NOT DETECTED NOT DETECTED Final   Klebsiella oxytoca NOT DETECTED NOT DETECTED Final   Klebsiella pneumoniae NOT DETECTED NOT DETECTED Final    Proteus species NOT DETECTED NOT DETECTED Final   Salmonella species NOT DETECTED NOT DETECTED Final   Serratia marcescens NOT DETECTED NOT DETECTED Final   Haemophilus influenzae NOT DETECTED NOT DETECTED Final   Neisseria meningitidis NOT DETECTED NOT DETECTED Final   Pseudomonas aeruginosa NOT DETECTED NOT DETECTED Final   Stenotrophomonas maltophilia NOT DETECTED NOT DETECTED Final   Candida albicans NOT DETECTED NOT DETECTED Final   Candida auris NOT DETECTED NOT DETECTED Final   Candida glabrata NOT DETECTED NOT DETECTED Final   Candida krusei NOT DETECTED NOT DETECTED Final   Candida parapsilosis NOT DETECTED NOT DETECTED Final   Candida tropicalis NOT DETECTED NOT DETECTED Final   Cryptococcus neoformans/gattii NOT DETECTED NOT DETECTED Final    Comment: Performed at [facility], [address] [place], [place] [address]  MRSA Next Gen by PCR, Nasal     Status: None   Collection Time: 02/08/22  2:35 PM   Specimen: Nasal Mucosa; Nasal Swab  Result Value Ref Range Status   MRSA by PCR Next Gen NOT DETECTED NOT DETECTED Final    Comment: (NOTE) The GeneXpert MRSA Assay (FDA approved for NASAL specimens only), is one component of a comprehensive MRSA colonization surveillance program. It is not intended to diagnose MRSA infection nor to  guide or monitor treatment for MRSA infections. Test performance is not FDA approved in patients less than 92 years old. Performed at [facility], [address] [place], [place] [address]   Aerobic/Anaerobic Culture w Gram Stain (surgical/deep wound)     Status: None (Preliminary result)   Collection Time: 02/11/22  3:34 PM   Specimen: Abscess; Body Fluid  Result Value Ref Range Status   Specimen Description   Final    ABSCESS Performed at [facility], [address] [place], [place] [address]    Special Requests LEFT BREAST ASPIRATION  Final   Gram Stain   Final    FEW WBC PRESENT,BOTH PMN AND  MONONUCLEAR NO ORGANISMS SEEN Performed at [facility], [place] [address], [place], [place] [address]    Culture   Final    RARE STAPHYLOCOCCUS EPIDERMIDIS SUSCEPTIBILITIES TO FOLLOW NO ANAEROBES ISOLATED; CULTURE IN PROGRESS FOR 5 DAYS    Report Status PENDING  Incomplete    Radiology Studies: US BREAST ASPIRATION LEFT  Result Date: 02/11/2022 INDICATION: Left breast fluid collection following lumpectomy, concern for secondary infection EXAM: Ultrasound-guided drainage of left breast fluid collection MEDICATIONS: None. ANESTHESIA/SEDATION: Local analgesia FLUOROSCOPY TIME:  N/a COMPLICATIONS: None immediate. PROCEDURE: Informed written consent was obtained from the patient after a thorough discussion of the procedural risks, benefits and alternatives. All questions were addressed. Maximal Sterile Barrier Technique was utilized including caps, mask, sterile gowns, sterile gloves, sterile drape, hand hygiene and skin antiseptic. A timeout was performed prior to the initiation of the procedure. The patient was placed supine on the exam table. Limited ultrasound of the left breast was performed in the area of interest. This again demonstrated a mixed echogenicity predominantly hypoechoic fluid collection with internal septations. Comparison with previous imaging suggest an evolving and liquified hematoma. Skin entry site was marked, and the overlying skin was prepped and draped in a standard sterile fashion. Local analgesia was obtained with 1% lidocaine. Using ultrasound guidance, a 19 gauge Yueh catheter was advanced into the largest anechoic pocket within the identified collection. There was immediate return of thin dark red/brown fluid. A total of approximately 125 mL of this fluid was aspirated, and sent to the lab for analysis. Postprocedure imaging demonstrates appropriate decompression of the anechoic fluid components, with residual more inflammatory debris remaining. The patient tolerated all  aspects of the procedure well without immediate complication. A clean dressing was placed. The patient was transferred in stable condition. IMPRESSION: Successful ultrasound-guided drainage of the left breast fluid collection with aspiration of 125 mL of thin dark red/brown fluid. A sample was sent to the lab for analysis. Imaging and aspirate findings are most suggestive of a liquified hematoma. Attention on microbiology studies for any findings of infection. Electronically Signed   By: Albin Felling M.D.   On: 02/11/2022 15:30    Scheduled Meds:  apixaban  5 mg Oral BID   aspirin  81 mg Oral Daily   atorvastatin  80 mg Oral QPM   Chlorhexidine Gluconate Cloth  6 each Topical Daily   diltiazem  240 mg Oral Daily   dorzolamide-timolol  1 drop Both Eyes BID   fesoterodine  4 mg Oral Daily   fluticasone  2 spray Each Nare Daily   insulin aspart  0-15 Units Subcutaneous TID WC   insulin glargine-yfgn  5 Units Subcutaneous Daily   latanoprost  1 drop Both Eyes QHS   letrozole  2.5 mg Oral Daily   loratadine  10  mg Oral Daily   metoprolol tartrate  100 mg Oral BID   multivitamin with minerals  1 tablet Oral Daily   pantoprazole  40 mg Oral Daily   polyvinyl alcohol  1 drop Both Eyes Daily   pregabalin  100 mg Oral TID   sertraline  100 mg Oral Daily   Continuous Infusions:  sodium chloride 20 mL/hr at 02/13/22 1334   amiodarone 30 mg/hr (02/13/22 0455)    ceFAZolin (ANCEF) IV 2 g (02/13/22 1333)     LOS: 5 days    Time spent: 35 mins    Niang Mitcheltree, MD [facility]   If 7PM-7AM, please contact night-coverage

## 2022-02-13 NOTE — Progress Notes (Signed)
PT Cancellation Note  Patient Details Name: JAHMIYAH DULLEA MRN: [id number] DOB: 1951/08/30   Cancelled Treatment:    Reason Eval/Treat Not Completed: Medical issues which prohibited therapy. Chart reviewed, pt with noted elevated HR this AM still. At this time PT to sign off due to at least 3 attempts made to initiate evaluation, and pt unable to participate. PT to sign off, please re-consult when patient is medically appropriate.   Lieutenant Diego 02/13/2022, 1:30 PM

## 2022-02-14 ENCOUNTER — Inpatient Hospital Stay (HOSPITAL_COMMUNITY)
Admit: 2022-02-14 | Discharge: 2022-02-14 | Disposition: A | Discharge status: Home / Self Care | Payer: Medicare HMO | Attending: Cardiovascular Disease | Admitting: Cardiovascular Disease

## 2022-02-14 ENCOUNTER — Inpatient Hospital Stay: Payer: Medicare HMO | Admitting: Anesthesiology

## 2022-02-14 ENCOUNTER — Encounter
Admission: EM | Disposition: A | Discharge status: Skilled Nursing Facility | Payer: Self-pay | Source: Home / Self Care | Attending: Family Medicine

## 2022-02-14 ENCOUNTER — Encounter: Payer: Self-pay | Admitting: Internal Medicine

## 2022-02-14 ENCOUNTER — Other Ambulatory Visit: Payer: Self-pay | Admitting: Orthopedic Surgery

## 2022-02-14 DIAGNOSIS — N6489 Other specified disorders of breast: Secondary | ICD-10-CM | POA: Diagnosis not present

## 2022-02-14 DIAGNOSIS — R238 Other skin changes: Secondary | ICD-10-CM

## 2022-02-14 DIAGNOSIS — I4892 Unspecified atrial flutter: Secondary | ICD-10-CM | POA: Diagnosis not present

## 2022-02-14 DIAGNOSIS — I483 Typical atrial flutter: Secondary | ICD-10-CM

## 2022-02-14 DIAGNOSIS — I361 Nonrheumatic tricuspid (valve) insufficiency: Secondary | ICD-10-CM

## 2022-02-14 DIAGNOSIS — A419 Sepsis, unspecified organism: Secondary | ICD-10-CM | POA: Diagnosis not present

## 2022-02-14 DIAGNOSIS — N61 Mastitis without abscess: Secondary | ICD-10-CM | POA: Diagnosis not present

## 2022-02-14 HISTORY — PX: CARDIOVERSION: SHX1299

## 2022-02-14 HISTORY — PX: TEE WITHOUT CARDIOVERSION: SHX5443

## 2022-02-14 LAB — CBC
HCT: 29.9 % — ABNORMAL LOW (ref 36.0–46.0)
Hemoglobin: 9.4 g/dL — ABNORMAL LOW (ref 12.0–15.0)
MCH: 27.5 pg (ref 26.0–34.0)
MCHC: 31.4 g/dL (ref 30.0–36.0)
MCV: 87.4 fL (ref 80.0–100.0)
Platelets: 263 10*3/uL (ref 150–400)
RBC: 3.42 MIL/uL — ABNORMAL LOW (ref 3.87–5.11)
RDW: 15.3 % (ref 11.5–15.5)
WBC: 7.2 10*3/uL (ref 4.0–10.5)
nRBC: 0 % (ref 0.0–0.2)

## 2022-02-14 LAB — BASIC METABOLIC PANEL
Anion gap: 8 (ref 5–15)
BUN: 17 mg/dL (ref 8–23)
CO2: 29 mmol/L (ref 22–32)
Calcium: 8.6 mg/dL — ABNORMAL LOW (ref 8.9–10.3)
Chloride: 99 mmol/L (ref 98–111)
Creatinine, Ser: 0.53 mg/dL (ref 0.44–1.00)
GFR, Estimated: >60 mL/min (ref >60)
Glucose, Bld: 207 mg/dL — ABNORMAL HIGH (ref 70–99)
Potassium: 3.8 mmol/L (ref 3.5–5.1)
Sodium: 136 mmol/L (ref 135–145)

## 2022-02-14 LAB — GLUCOSE, CAPILLARY
Glucose-Capillary: 151 mg/dL — ABNORMAL HIGH (ref 70–99)
Glucose-Capillary: 193 mg/dL — ABNORMAL HIGH (ref 70–99)
Glucose-Capillary: 169 mg/dL — ABNORMAL HIGH (ref 70–99)
Glucose-Capillary: 164 mg/dL — ABNORMAL HIGH (ref 70–99)
Glucose-Capillary: 207 mg/dL — ABNORMAL HIGH (ref 70–99)

## 2022-02-14 LAB — MAGNESIUM: Magnesium: 1.9 mg/dL (ref 1.7–2.4)

## 2022-02-14 LAB — PHOSPHORUS: Phosphorus: 4.2 mg/dL (ref 2.5–4.6)

## 2022-02-14 SURGERY — CARDIOVERSION
Anesthesia: General

## 2022-02-14 MED ORDER — DEXMEDETOMIDINE (PRECEDEX) IN NS 20 MCG/5ML (4 MCG/ML) IV SYRINGE
PREFILLED_SYRINGE | INTRAVENOUS | Status: DC | PRN
  Administered 2022-02-14: 20 ug via INTRAVENOUS

## 2022-02-14 MED ORDER — LIDOCAINE VISCOUS HCL 2 % MT SOLN
OROMUCOSAL | Status: AC
  Administered 2022-02-14: 15 mL via OROMUCOSAL
  Filled 2022-02-14: qty 15

## 2022-02-14 MED ORDER — PROPOFOL 10 MG/ML IV BOLUS
INTRAVENOUS | Status: DC | PRN
  Administered 2022-02-14 (×2): 5 mg via INTRAVENOUS
  Administered 2022-02-14: 70 mg via INTRAVENOUS
  Administered 2022-02-14: 10 mg via INTRAVENOUS
  Administered 2022-02-14: 5 mg via INTRAVENOUS
  Administered 2022-02-14: 70 mg via INTRAVENOUS
  Administered 2022-02-14: 5 mg via INTRAVENOUS

## 2022-02-14 MED ORDER — PROPOFOL 10 MG/ML IV BOLUS
INTRAVENOUS | Status: AC
  Filled 2022-02-14: qty 40

## 2022-02-14 MED ORDER — PHENYLEPHRINE 40 MCG/ML (10ML) SYRINGE FOR IV PUSH (FOR BLOOD PRESSURE SUPPORT)
PREFILLED_SYRINGE | INTRAVENOUS | Status: DC | PRN
  Administered 2022-02-14 (×4): 80 ug via INTRAVENOUS

## 2022-02-14 MED ORDER — METOPROLOL TARTRATE 50 MG PO TABS
50.0000 mg | ORAL_TABLET | Freq: Two times a day (BID) | ORAL | Status: DC
  Administered 2022-02-14 – 2022-02-17 (×4): 50 mg via ORAL
  Filled 2022-02-14 (×9): qty 1

## 2022-02-14 MED ORDER — BUTAMBEN-TETRACAINE-BENZOCAINE 2-2-14 % EX AERO
INHALATION_SPRAY | CUTANEOUS | Status: AC
  Filled 2022-02-14: qty 5

## 2022-02-14 NOTE — Progress Notes (Signed)
[facility] Day(s): 6.    Interval History: Patient seen and examined, recently returned to the ward from cardioversion, new bleb on left breast noted.  Concerns raised regarding worsening of patient's cellulitic changes of the left breast.  Patient continues to have chronic pain of this area.  Otherwise no acute events or new complaints.   Review of Systems:  Constitutional: denies fever, chills  Respiratory: denies any shortness of breath  Cardiovascular: denies chest pain  Gastrointestinal: denies abdominal pain, N/V Musculoskeletal: denies pain, decreased motor or sensation Integumentary: denies any other rashes or skin discolorations except  Vital signs in last 24 hours: [min-max] current  Temp:  [98 F (36.7 C)-98.6 F (37 C)] 98.3 F (36.8 C) (02/24 1158) Pulse Rate:  [51-144] 65 (02/24 1158) Resp:  [16-29] 17 (02/24 1158) BP: (80-120)/(50-77) 103/62 (02/24 1158) SpO2:  [92 %-100 %] 97 % (02/24 1158)     Height: 5\' 4"  (162.6 cm) Weight: 108.9 kg BMI (Calculated): 41.18   Intake/Output last 2 shifts:  02/23 0701 - 02/24 0700 In: 840 [P.O.:840] Out: 1500 [Urine:1500]   Physical Exam:  Constitutional: alert, cooperative and no distress  Respiratory: breathing non-labored at rest  Gastrointestinal: soft, non-tender, and non-distended Integumentary: I remove the Tegaderm that was placed 3 days ago and the underlying gauze.  The gauze left a significant impression on the edematous skin in the upper outer quadrant of the left breast.  The Tegaderm had caused a bleb.  I do not believe there is any worsening of her erythematous changes, in fact I believe it is significantly diminished.  Some of the edema has diminished to the point where I can appreciate a softness of the underlying seroma.     Labs:  CBC Latest Ref Rng & Units 02/14/2022 02/13/2022 02/12/2022  WBC 4.0 - 10.5 K/uL 7.2 7.9 8.2  Hemoglobin 12.0 - 15.0 g/dL 9.4(L)  10.0(L) 9.9(L)  Hematocrit 36.0 - 46.0 % 29.9(L) 31.8(L) 31.2(L)  Platelets 150 - 400 K/uL 263 257 247   CMP Latest Ref Rng & Units 02/14/2022 02/13/2022 02/12/2022  Glucose 70 - 99 mg/dL 207(H) 214(H) 205(H)  BUN 8 - 23 mg/dL 17 16 13   Creatinine 0.44 - 1.00 mg/dL 0.53 0.55 0.54  Sodium 135 - 145 mmol/L 136 136 137  Potassium 3.5 - 5.1 mmol/L 3.8 4.2 4.0  Chloride 98 - 111 mmol/L 99 97(L) 98  CO2 22 - 32 mmol/L 29 32 28  Calcium 8.9 - 10.3 mg/dL 8.6(L) 8.7(L) 8.7(L)  Total Protein 6.5 - 8.1 g/dL - - -  Total Bilirubin 0.3 - 1.2 mg/dL - - -  Alkaline Phos 38 - 126 U/L - - -  AST 15 - 41 U/L - - -  ALT 0 - 44 U/L - - -     Imaging studies: No new pertinent imaging studies   Assessment/Plan:  71 y.o. female with new blab, iatrogenic, adjacent to area of previously noted cellulitis.  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  -I concur there is no evidence of sepsis, no evidence of worsening cellulitis.  -Adjust antibiotics as needed, there is no role for surgical intervention at present.  -Current  bleb appears intact, it also appears sterile.  I would leave it alone as long as possible.  Should the lab deflate, topical application of Tegaderm may be applied to continue observation.  We will be happy to debride the bleb once it has been violated.  All of the above findings and recommendations were discussed with the patient, and all of patient's questions were answered to their expressed satisfaction.  -- Ronny Bacon, M.D., [facility] 02/14/2022

## 2022-02-14 NOTE — Transfer of Care (Signed)
Immediate Anesthesia Transfer of Care Note  Patient: Rhonda Thornton  Procedure(s) Performed: Procedure(s) with comments: CARDIOVERSION (N/A) - TEE and cardioversion TRANSESOPHAGEAL ECHOCARDIOGRAM (TEE) (N/A)  Patient Location: PACU and Short Stay  Anesthesia Type:General  Level of Consciousness: awake, alert  and oriented  Airway & Oxygen Therapy: Patient Spontanous Breathing and Patient connected to nasal cannula oxygen  Post-op Assessment: Report given to RN and Post -op Vital signs reviewed and stable  Post vital signs: Reviewed and stable  Last Vitals:  Vitals:   02/14/22 0808 02/14/22 0813  BP:  (!) 100/53  Pulse: (!) 57 (!) 57  Resp: (!) 25 18  Temp:    SpO2: 951% 884%    Complications: No apparent anesthesia complications

## 2022-02-14 NOTE — Progress Notes (Signed)
*  PRELIMINARY RESULTS* Echocardiogram Echocardiogram Transesophageal has been performed.  Sherrie Sport 02/14/2022, 8:29 AM

## 2022-02-14 NOTE — Progress Notes (Signed)
PROGRESS NOTE    Rhonda Thornton  UVO:[id number] DOB: 04/23/1951 DOA: 02/08/2022 PCP: Denton Lank, MD   Brief Narrative:  Rhonda Thornton is a 71 y.o. 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 Patient states that she has not felt well for the last 3 days and has been weak, fatigued, has had myalgias as well as fever and chills. She called EMS and when they arrived,  She was found to be in A-fib with a rapid ventricular rate with heart rates between 160 and 180. She complains of feeling very sore in her left breast and has had nausea.  She was found to be septic from left breast cellulitis,  started on IV antibiotics.  She continues to remain in A-fib with RVR despite being on multiple interventions.  Cardio recommended TEE with cardioversion which was successfully completed today.   Assessment & Plan:   Principal Problem:   Sepsis ([place]) Active Problems:   Carcinoma of upper-outer quadrant of female breast, left ([place])   Type 2 diabetes mellitus without complications ([place])   Essential (primary) hypertension   Sleep apnea   Morbid obesity ([place])   Cellulitis of left breast   Atrial fibrillation with RVR ([facility])  Sepsis from left breast cellulitis: She presented with fever, tachycardia, tachypnea, leukocytosis and left breast cellulitis Patient has  known large seroma in her left breast.   Imaging did not show any evidence of an abscess at this time. She underwent ultrasound-guided aspiration by IR. 125 ml dark red/brown fluid removed and sent for culture. Empirically initiated on vancomycin and ceftriaxone and then broadened to Zosyn. Now narrowed down to Ancef as per ID pharmacy. Cultures growing rare  Staph epidermidis.  Sepsis physiology resolved. Patient has worsening erythema, General surgery notified.   Stage Ia invasive Breast carcinoma  Status post lumpectomy and whole breast radiation of her left breast.  Known large seroma Continue Femara  Informed Dr. Grayland Ormond , pt will follow up as outpatient. Informed Dr. Christian Mate about the seroma, who recommended fluid aspiration. She underwent ultrasound-guided aspiration by IR. 125 ml dark red/brown fluid removed and sent for culture.   Atrial fibrillation with RVR: Likely triggered by sepsis, started on Lovenox for stroke prophylaxis. Cardiology consulted Dr. Nehemiah Massed. Heart rate was controlled initially and converted but now back to A-fib with RVR again. Heart rate remains uncontrolled despite multiple medications. Continued Cardizem 240 mg daily,  Increased Lopressor 100 mg twice daily Started on amiodarone. Continue Eliquis. Patient underwent successful TEE with cardioversion to NSR.  Diabetes mellitus: Continue regular insulin sliding scale.   Depression Continue Zoloft   Morbid obesity (BMI 41) Complicates overall prognosis and care.   Chronic hypoxic respiratory failure. Continue supplemental oxygen.   Chronic pain on chronic opioids Continue Norco as needed.   DVT prophylaxis: Eliquis Code Status: Full code Family Communication: No family at bedside Disposition Plan:  Status is: Inpatient Remains inpatient appropriate because:  Admitted for sepsis secondary to cellulitis on IV antibiotics and has developed A-fib with RVR which is uncontrolled. She underwent successful TEE with cardioversion to NSR.  Consultants:  Cardiology  Procedures: Ultrasound-guided aspiration of breast seroma, TEE with cardioversion. Antimicrobials: ( Anti-infectives (From admission, onward)    Start  Dose/Rate Route Frequency Ordered Stop   02/12/22 0600  ceFAZolin (ANCEF) IVPB 1 g/50 mL premix  Status:  Discontinued        1  g 100 mL/hr over 30 Minutes Intravenous Every 12 hours 02/11/22 1156 02/11/22 1703   02/12/22 0600  ceFAZolin (ANCEF) IVPB 2g/100 mL premix        2 g 200 mL/hr over 30 Minutes Intravenous Every 8 hours 02/11/22 1703     02/09/22 2200  cefTRIAXone (ROCEPHIN) 2 g in sodium chloride 0.9 % 100 mL IVPB  Status:  Discontinued        2 g 200 mL/hr over 30 Minutes Intravenous Every 24 hours 02/09/22 1815 02/11/22 1156   02/09/22 1000  vancomycin (VANCOREADY) IVPB 1250 mg/250 mL  Status:  Discontinued        1,250 mg 166.7 mL/hr over 90 Minutes Intravenous Every 24 hours 02/08/22 1012 02/11/22 1156   02/08/22 1400  ceFEPIme (MAXIPIME) 2 g in sodium chloride 0.9 % 100 mL IVPB  Status:  Discontinued        2 g 200 mL/hr over 30 Minutes Intravenous Every 8 hours 02/08/22 1010 02/08/22 1025   02/08/22 1400  piperacillin-tazobactam (ZOSYN) IVPB 3.375 g  Status:  Discontinued        3.375 g 12.5 mL/hr over 240 Minutes Intravenous Every 8 hours 02/08/22 1025 02/09/22 1815   02/08/22 1000  ceFEPIme (MAXIPIME) 2 g in sodium chloride 0.9 % 100 mL IVPB  Status:  Discontinued        2 g 200 mL/hr over 30 Minutes Intravenous  Once 02/08/22 0944 02/08/22 1010   02/08/22 1000  metroNIDAZOLE (FLAGYL) IVPB 500 mg  Status:  Discontinued        500 mg 100 mL/hr over 60 Minutes Intravenous Every 12 hours 02/08/22 0944 02/08/22 1024   02/08/22 1000  vancomycin (VANCOCIN) IVPB 1000 mg/200 mL premix  Status:  Discontinued        1,000 mg 200 mL/hr over 60 Minutes Intravenous  Once 02/08/22 0944 02/08/22 0957   02/08/22 0545  vancomycin (VANCOREADY) IVPB 2000 mg/400 mL       See Hyperspace for full Linked Orders Report.   2,000 mg 200 mL/hr over 120 Minutes Intravenous  Once 02/08/22 0541 02/08/22 0854   02/08/22 0545  vancomycin (VANCOREADY) IVPB 500 mg/100 mL       See Hyperspace for full Linked Orders Report.   500 mg 100 mL/hr over 60 Minutes Intravenous  Once 02/08/22 0541 02/08/22 1000   02/08/22 0530   cefTRIAXone (ROCEPHIN) 2 g in sodium chloride 0.9 % 100 mL IVPB  Status:  Discontinued        2 g 200 mL/hr over 30 Minutes Intravenous Every 24 hours 02/08/22 0525 02/08/22 1010       Subjective: Patient was seen and examined at bedside.  Overnight events noted.   She underwent successful TEE with cardioversion to NSR. She reports pain in the left breast has increased after cardioversion.   Objective: Vitals:   02/14/22 0815 02/14/22 0845 02/14/22 0954 02/14/22 1158  BP: (!) 100/55 97/60 (!) 89/50 103/62  Pulse: (!) 51 64 64 65  Resp: 19 (!) 23 19 17   Temp:   98 F (36.7 C) 98.3 F (36.8 C)  TempSrc:      SpO2: 100% 95% 95% 97%  Weight:      Height:        Intake/Output Summary (Last 24 hours) at 02/14/2022 1338 Last data filed  at 02/14/2022 1000 Gross per 24 hour  Intake 480 ml  Output 2350 ml  Net -1870 ml   Filed Weights   02/08/22 0536 02/08/22 1441 02/13/22 1502  Weight: 108.4 kg 108.4 kg 108.9 kg    Examination:  General exam: Appears deconditioned, comfortable, not in any acute distress. Respiratory system: CTA bilaterally, no crackles, no wheezing, respiratory effort normal. Cardiovascular system: S1 & S2 heard, regular rate and rhythm, no murmur Gastrointestinal system: Abdomen is soft, non tender, non distended, BS+. Central nervous system: Alert and oriented x 3. No focal neurological deficits. Extremities: No edema, no cyanosis, no clubbing. Skin: Left breast area, Lt. mid axillary area erythematous, tender, warm. Psychiatry: Judgement and insight appear normal. Mood & affect appropriate.     Data Reviewed: I have personally reviewed following labs and imaging studies  CBC: Recent Labs  Lab 02/08/22 0542 02/09/22 0440 02/10/22 0502 02/11/22 0620 02/12/22 0612 02/13/22 0625 02/14/22 0557  WBC 13.7*   < > 6.2 6.9 8.2 7.9 7.2  NEUTROABS 12.0*  --   --   --   --   --   --   HGB 11.0*   < > 8.1* 9.1* 9.9* 10.0* 9.4*  HCT 35.0*   < > 26.0* 29.3*  31.2* 31.8* 29.9*  MCV 88.8   < > 89.0 88.0 86.9 86.9 87.4  PLT 173   < > 151 186 247 257 263   < > = values in this interval not displayed.   Basic Metabolic Panel: Recent Labs  Lab 02/10/22 0502 02/11/22 0620 02/12/22 0612 02/13/22 0625 02/14/22 0557  NA 135 136 137 136 136  K 3.9 3.8 4.0 4.2 3.8  CL 101 99 98 97* 99  CO2 27 27 28  32 29  GLUCOSE 179* 188* 205* 214* 207*  BUN 15 15 13 16 17   CREATININE 0.62 0.45 0.54 0.55 0.53  CALCIUM 8.1* 8.4* 8.7* 8.7* 8.6*  MG 1.8 1.8 2.0 2.2 1.9  PHOS  --   --   --   --  4.2   GFR: Estimated Creatinine Clearance: 77.8 mL/min (by C-G formula based on SCr of 0.53 mg/dL). Liver Function Tests: Recent Labs  Lab 02/08/22 0542  AST 18  ALT 14  ALKPHOS 63  BILITOT 1.1  PROT 7.7  ALBUMIN 3.8   No results for input(s): LIPASE, AMYLASE in the last 168 hours. No results for input(s): AMMONIA in the last 168 hours. Coagulation Profile: Recent Labs  Lab 02/08/22 0542 02/09/22 0440  INR 1.2 1.2   Cardiac Enzymes: No results for input(s): CKTOTAL, CKMB, CKMBINDEX, TROPONINI in the last 168 hours. BNP (last 3 results) No results for input(s): PROBNP in the last 8760 hours. HbA1C: No results for input(s): HGBA1C in the last 72 hours. CBG: Recent Labs  Lab 02/13/22 1512 02/13/22 1611 02/13/22 2016 02/14/22 1008 02/14/22 1136  GLUCAP 151* 162* 230* 193* 169*   Lipid Profile: No results for input(s): CHOL, HDL, LDLCALC, TRIG, CHOLHDL, LDLDIRECT in the last 72 hours. Thyroid Function Tests: No results for input(s): TSH, T4TOTAL, FREET4, T3FREE, THYROIDAB in the last 72 hours. Anemia Panel: No results for input(s): VITAMINB12, FOLATE, FERRITIN, TIBC, IRON, RETICCTPCT in the last 72 hours. Sepsis Labs: Recent Labs  Lab 02/08/22 0542 02/09/22 0440  PROCALCITON 0.46 0.30  LATICACIDVEN 1.7  --     Recent Results (from the past 240 hour(s))  Resp Panel by RT-PCR (Flu A&B, Covid) Nasopharyngeal Swab     Status: None    Collection  Time: 02/08/22  5:42 AM   Specimen: Nasopharyngeal Swab; Nasopharyngeal(NP) swabs in vial transport medium  Result Value Ref Range Status   SARS Coronavirus 2 by RT PCR NEGATIVE NEGATIVE Final    Comment: (NOTE) SARS-CoV-2 target nucleic acids are NOT DETECTED.  The SARS-CoV-2 RNA is generally detectable in upper respiratory specimens during the acute phase of infection. The lowest concentration of SARS-CoV-2 viral copies this assay can detect is 138 copies/mL. A negative result does not preclude SARS-Cov-2 infection and should not be used as the sole basis for treatment or other patient management decisions. A negative result may occur with  improper specimen collection/handling, submission of specimen other than nasopharyngeal swab, presence of viral mutation(s) within the areas targeted by this assay, and inadequate number of viral copies(<138 copies/mL). A negative result must be combined with clinical observations, patient history, and epidemiological information. The expected result is Negative.  Fact Sheet for Patients:  EntrepreneurPulse.com.au  Fact Sheet for Healthcare Providers:  IncredibleEmployment.be  This test is no t yet approved or cleared by the [place] FDA and  has been authorized for detection and/or diagnosis of SARS-CoV-2 by FDA under an Emergency Use Authorization (EUA). This EUA will remain  in effect (meaning this test can be used) for the duration of the COVID-19 declaration under Section 564(b)(1) of the Act, 21 U.S.C.section 360bbb-3(b)(1), unless the authorization is terminated  or revoked sooner.       Influenza A by PCR NEGATIVE NEGATIVE Final   Influenza B by PCR NEGATIVE NEGATIVE Final    Comment: (NOTE) The Xpert Xpress SARS-CoV-2/FLU/RSV plus assay is intended as an aid in the diagnosis of influenza from Nasopharyngeal swab specimens and should not be used as a sole basis for treatment.  Nasal washings and aspirates are unacceptable for Xpert Xpress SARS-CoV-2/FLU/RSV testing.  Fact Sheet for Patients: EntrepreneurPulse.com.au  Fact Sheet for Healthcare Providers: IncredibleEmployment.be  This test is not yet approved or cleared by the [place] FDA and has been authorized for detection and/or diagnosis of SARS-CoV-2 by FDA under an Emergency Use Authorization (EUA). This EUA will remain in effect (meaning this test can be used) for the duration of the COVID-19 declaration under Section 564(b)(1) of the Act, 21 U.S.C. section 360bbb-3(b)(1), unless the authorization is terminated or revoked.  Performed at [facility], [address] [place], [place] [address]   Blood Culture (routine x 2)     Status: None   Collection Time: 02/08/22  5:42 AM   Specimen: BLOOD RIGHT FOREARM  Result Value Ref Range Status   Specimen Description BLOOD RIGHT FOREARM  Final   Special Requests   Final    BOTTLES DRAWN AEROBIC AND ANAEROBIC Blood Culture adequate volume   Culture   Final    NO GROWTH 5 DAYS Performed at [facility], [address] [place], [place] [address]    Report Status 02/13/2022 FINAL  Final  Blood Culture (routine x 2)     Status: Abnormal   Collection Time: 02/08/22  5:42 AM   Specimen: BLOOD  Result Value Ref Range Status   Specimen Description   Final    BLOOD RIGHT ANTECUBITAL Performed at [facility], [place] [address] [place], [place] [address]    Special Requests   Final    BOTTLES DRAWN AEROBIC AND ANAEROBIC Blood Culture adequate volume Performed at [facility], [address], [place], [place] [address]    Culture  Setup Time   Final  GRAM POSITIVE COCCI AEROBIC BOTTLE ONLY CRITICAL RESULT CALLED TO, READ BACK BY AND VERIFIED WITH: JASON BELUE PHARMD AT 0330 02/09/2022 GA    Culture (A)  Final    STAPHYLOCOCCUS HOMINIS THE SIGNIFICANCE OF ISOLATING THIS  ORGANISM FROM A SINGLE SET OF BLOOD CULTURES WHEN MULTIPLE SETS ARE DRAWN IS UNCERTAIN. PLEASE NOTIFY THE MICROBIOLOGY DEPARTMENT WITHIN ONE WEEK IF SPECIATION AND SENSITIVITIES ARE REQUIRED. Performed at [facility], [place] [address], [place], [place] [address]    Report Status 02/11/2022 FINAL  Final  Urine Culture     Status: None   Collection Time: 02/08/22  5:42 AM   Specimen: In/Out Cath Urine  Result Value Ref Range Status   Specimen Description   Final    IN/OUT CATH URINE Performed at [facility], [address], [place], [place] [address]    Special Requests   Final    NONE Performed at [facility], [address], [place], [place] [address]    Culture   Final    NO GROWTH Performed at [facility], [place] [address] [place],  [address]    Report Status 02/09/2022 FINAL  Final  Blood Culture ID Panel (Reflexed)     Status: Abnormal   Collection Time: 02/08/22  5:42 AM  Result Value Ref Range Status   Enterococcus faecalis NOT DETECTED NOT DETECTED Final   Enterococcus Faecium NOT DETECTED NOT DETECTED Final   Listeria monocytogenes NOT DETECTED NOT DETECTED Final   Staphylococcus species DETECTED (A) NOT DETECTED Final    Comment: CRITICAL RESULT CALLED TO, READ BACK BY AND VERIFIED WITH: JASON BELUE PHARMD AT 0330 02/09/2022 GA    Staphylococcus aureus (BCID) NOT DETECTED NOT DETECTED Final   Staphylococcus epidermidis NOT DETECTED NOT DETECTED Final   Staphylococcus lugdunensis NOT DETECTED NOT DETECTED Final   Streptococcus species NOT DETECTED NOT DETECTED Final   Streptococcus agalactiae NOT DETECTED NOT DETECTED Final   Streptococcus pneumoniae NOT DETECTED NOT DETECTED Final   Streptococcus pyogenes NOT DETECTED NOT DETECTED Final   A.calcoaceticus-baumannii NOT DETECTED NOT DETECTED Final   Bacteroides fragilis NOT DETECTED NOT DETECTED Final   Enterobacterales NOT DETECTED NOT DETECTED Final   Enterobacter cloacae  complex NOT DETECTED NOT DETECTED Final   Escherichia coli NOT DETECTED NOT DETECTED Final   Klebsiella aerogenes NOT DETECTED NOT DETECTED Final   Klebsiella oxytoca NOT DETECTED NOT DETECTED Final   Klebsiella pneumoniae NOT DETECTED NOT DETECTED Final   Proteus species NOT DETECTED NOT DETECTED Final   Salmonella species NOT DETECTED NOT DETECTED Final   Serratia marcescens NOT DETECTED NOT DETECTED Final   Haemophilus influenzae NOT DETECTED NOT DETECTED Final   Neisseria meningitidis NOT DETECTED NOT DETECTED Final   Pseudomonas aeruginosa NOT DETECTED NOT DETECTED Final   Stenotrophomonas maltophilia NOT DETECTED NOT DETECTED Final   Candida albicans NOT DETECTED NOT DETECTED Final   Candida auris NOT DETECTED NOT DETECTED Final   Candida glabrata NOT DETECTED NOT DETECTED Final   Candida krusei NOT DETECTED NOT DETECTED Final   Candida parapsilosis NOT DETECTED NOT DETECTED Final   Candida tropicalis NOT DETECTED NOT DETECTED Final   Cryptococcus neoformans/gattii NOT DETECTED NOT DETECTED Final    Comment: Performed at [facility], [address] [place],  [address]  MRSA Next Gen by PCR, Nasal     Status: None   Collection Time: 02/08/22  2:35 PM   Specimen: Nasal Mucosa; Nasal Swab  Result Value Ref Range Status  MRSA by PCR Next Gen NOT DETECTED NOT DETECTED Final    Comment: (NOTE) The GeneXpert MRSA Assay (FDA approved for NASAL specimens only), is one component of a comprehensive MRSA colonization surveillance program. It is not intended to diagnose MRSA infection nor to guide or monitor treatment for MRSA infections. Test performance is not FDA approved in patients less than 80 years old. Performed at [facility], [address] [place], [place] [address]   Aerobic/Anaerobic Culture w Gram Stain (surgical/deep wound)     Status: None (Preliminary result)   Collection Time: 02/11/22  3:34 PM   Specimen: Abscess; Body Fluid  Result  Value Ref Range Status   Specimen Description   Final    ABSCESS Performed at [facility], [address] [place], [place] [address]    Special Requests LEFT BREAST ASPIRATION  Final   Gram Stain   Final    FEW WBC PRESENT,BOTH PMN AND MONONUCLEAR NO ORGANISMS SEEN Performed at [facility], [place] [address], [place], [place] [address]    Culture   Final    RARE STAPHYLOCOCCUS EPIDERMIDIS NO ANAEROBES ISOLATED; CULTURE IN PROGRESS FOR 5 DAYS    Report Status PENDING  Incomplete   Organism ID, Bacteria STAPHYLOCOCCUS EPIDERMIDIS  Final      Susceptibility   Staphylococcus epidermidis - MIC*    CIPROFLOXACIN <=0.5 SENSITIVE Sensitive     ERYTHROMYCIN >=8 RESISTANT Resistant     GENTAMICIN <=0.5 SENSITIVE Sensitive     OXACILLIN <=0.25 SENSITIVE Sensitive     TETRACYCLINE <=1 SENSITIVE Sensitive     VANCOMYCIN 2 SENSITIVE Sensitive     TRIMETH/SULFA 160 RESISTANT Resistant     CLINDAMYCIN <=0.25 SENSITIVE Sensitive     RIFAMPIN <=0.5 SENSITIVE Sensitive     Inducible Clindamycin NEGATIVE Sensitive     * RARE STAPHYLOCOCCUS EPIDERMIDIS    Radiology Studies: ECHO TEE  Result Date: 02/14/2022    TRANSESOPHOGEAL ECHO REPORT   Patient Name:   NEFTALY INZUNZA Date of Exam: 02/14/2022 Medical Rec #:  [id number]      Height:       64.0 in Accession #:    [id number]     Weight:       240.0 lb Date of Birth:  1950-12-25      BSA:          2.114 m Patient Age:    41 years       BP:           100/55 mmHg Patient Gender: F              HR:           51 bpm. Exam Location:  [facility] Procedure: Transesophageal Echo, Cardiac Doppler, Color Doppler and Saline            Contrast Bubble Study Indications:     Atrial Flutter I48.92  History:         Patient has prior history of Echocardiogram examinations, most                  recent 02/10/2022. Signs/Symptoms:Murmur; Risk                  Factors:Hypertension and Dyslipidemia.  Sonographer:     Sherrie Sport Referring Phys:  [place]  Diagnosing Phys: Ida Rogue MD PROCEDURE: After discussion of the risks and benefits of a TEE, an informed consent was obtained from the patient. TEE procedure time  was 30 minutes. The transesophogeal probe was passed without difficulty through the esophogus of the patient. Imaged were obtained with the patient in a left lateral decubitus position. Local oropharyngeal anesthetic was provided with Benzocaine spray and Cetacaine. Sedation performed by different physician. Patients was under conscious sedation during this procedure. The patient's vital signs; including heart rate, blood pressure, and oxygen saturation; remained stable throughout the procedure. The patient developed no complications during the procedure. A successful direct current cardioversion was performed at 150 joules with 1 attempt. IMPRESSIONS  1. Left ventricular ejection fraction, by estimation, is 55 to 60%. The left ventricle has normal function. The left ventricle has no regional wall motion abnormalities. There is mild left ventricular hypertrophy.  2. Right ventricular systolic function is mildly reduced. The right ventricular size is normal.  3. Left atrial size was moderately dilated. No left atrial/left atrial appendage thrombus was detected.  4. The mitral valve is normal in structure. Trivial mitral valve regurgitation. No evidence of mitral stenosis.  5. The aortic valve is tricuspid. There is severe calcifcation of the aortic valve. Aortic valve regurgitation is not visualized. Moderate to severe aortic valve stenosis by planar measurement, 0.9 to 1 cm sq. Non-coronary cusp is relatively non-mobile.  3D images obtained.  6. The inferior vena cava is normal in size with greater than 50% respiratory variability, suggesting right atrial pressure of 3 mmHg.  7. Right atrial size was mildly dilated.  8. Agitated saline contrast bubble study was negative, with no evidence of any interatrial shunt. Conclusion(s)/Recommendation(s):  Normal biventricular function without evidence of hemodynamically significant valvular heart disease. FINDINGS  Left Ventricle: Left ventricular ejection fraction, by estimation, is 55 to 60%. The left ventricle has normal function. The left ventricle has no regional wall motion abnormalities. The left ventricular internal cavity size was normal in size. There is  mild left ventricular hypertrophy. Right Ventricle: The right ventricular size is normal. No increase in right ventricular wall thickness. Right ventricular systolic function is mildly reduced. Left Atrium: Left atrial size was moderately dilated. No left atrial/left atrial appendage thrombus was detected. Right Atrium: Right atrial size was mildly dilated. Pericardium: There is no evidence of pericardial effusion. Mitral Valve: The mitral valve is normal in structure. Trivial mitral valve regurgitation. No evidence of mitral valve stenosis. Tricuspid Valve: The tricuspid valve is normal in structure. Tricuspid valve regurgitation is mild . No evidence of tricuspid stenosis. Aortic Valve: The aortic valve is tricuspid. There is severe calcifcation of the aortic valve. Aortic valve regurgitation is not visualized. Moderate to severe aortic stenosis is present. Pulmonic Valve: The pulmonic valve was normal in structure. Pulmonic valve regurgitation is not visualized. No evidence of pulmonic stenosis. Aorta: The aortic root is normal in size and structure. Venous: The inferior vena cava is normal in size with greater than 50% respiratory variability, suggesting right atrial pressure of 3 mmHg. IAS/Shunts: No atrial level shunt detected by color flow Doppler. Agitated saline contrast was given intravenously to evaluate for intracardiac shunting. Agitated saline contrast bubble study was negative, with no evidence of any interatrial shunt. Ida Rogue MD Electronically signed by Ida Rogue MD Signature Date/Time: 02/14/2022/9:46:28 AM    Final      Scheduled Meds:  apixaban  5 mg Oral BID   aspirin  81 mg Oral Daily   atorvastatin  80 mg Oral QPM   Chlorhexidine Gluconate Cloth  6 each Topical Daily   diltiazem  240 mg Oral Daily   dorzolamide-timolol  1  drop Both Eyes BID   fesoterodine  4 mg Oral Daily   fluticasone  2 spray Each Nare Daily   insulin aspart  0-15 Units Subcutaneous TID WC   insulin glargine-yfgn  5 Units Subcutaneous Daily   latanoprost  1 drop Both Eyes QHS   letrozole  2.5 mg Oral Daily   loratadine  10 mg Oral Daily   metoprolol tartrate  50 mg Oral BID   multivitamin with minerals  1 tablet Oral Daily   pantoprazole  40 mg Oral Daily   polyvinyl alcohol  1 drop Both Eyes Daily   pregabalin  100 mg Oral TID   senna-docusate  2 tablet Oral BID   sertraline  100 mg Oral Daily   Continuous Infusions:  amiodarone 30 mg/hr (02/14/22 0513)    ceFAZolin (ANCEF) IV 2 g (02/14/22 0512)     LOS: 6 days    Time spent: 35 mins    Anesha Hackert, MD [facility]   If 7PM-7AM, please contact night-coverage

## 2022-02-14 NOTE — Progress Notes (Signed)
[facility] Cardiology  Patient Description: Rhonda Thornton is a 71 y/o female with PMH significant for severe AS, HTN, DM2, HLD, COPD, chronic respiratory failure (on supplemental oxygen), stage Ia invasive mammary carcinoma involving the left breast s/p lumpectomy with subsequent radiation, h/o seizures and morbid obesity who was admitted due to a fall and was found to have sepsis. Cardiology consulted for new onset atrial fibrillation with RVR.    SUBJECTIVE: The patient is feeling well on today and denies having any cardiac complaints at this time. She does not have any residual pain  or discomfort from the procedure.   02/13/22: The patient is feeling "the same" on today and she continues to deny having any chest pain, palpitations, or dyspnea at this time. She rested well overnight.   02/12/22: The patient is feeling "okay" and she denies having any cardiac symptoms at this time. Her previous symptoms of left breast pain has now resolved. She is breathing well on the 2L of supplemental oxygen.   OBJECTIVE:   Vitals:   02/17/22 0051 02/17/22 0402 02/17/22 0822 02/17/22 1249  BP: (!) 96/52 (!) 108/50 (!) 109/55 91/76  Pulse: 64 67 63 67  Resp: 20 18 16 18   Temp: 99 F (37.2 C) 98.9 F (37.2 C) 98.8 F (37.1 C) 98.9 F (37.2 C)  TempSrc:      SpO2: 95% 95% 95% 96%  Weight:      Height:         Intake/Output Summary (Last 24 hours) at 02/17/2022 1638 Last data filed at 02/17/2022 1107 Gross per 24 hour  Intake 558.8 ml  Output 1400 ml  Net -841.2 ml      PHYSICAL EXAM  General: Well developed, well nourished, in no acute distress HEENT:  Normocephalic and atraumatic. PERRL Neck:  No JVD.  Lungs: Clear bilaterally in upper lobes, diminished breath sounds to lower lobes upon auscultation. Chest expansion symmetrical. No wheezes, rales, crackles or rhonchi.  Heart: HR regular but notably fast. Normal S1 and S2 without gallops or murmurs.  Abdomen: distended. Bowel sounds are positive,  abdomen soft and non-tender. Msk:  Normal strength and tone for age. Extremities: No clubbing, cyanosis or edema.   Neuro: Alert and oriented X 3. Psych:  Good affect, responds appropriately   LABS: Basic Metabolic Panel: No results for input(s): NA, K, CL, CO2, GLUCOSE, BUN, CREATININE, CALCIUM, MG, PHOS in the last 72 hours.  Liver Function Tests: No results for input(s): AST, ALT, ALKPHOS, BILITOT, PROT, ALBUMIN in the last 72 hours. No results for input(s): LIPASE, AMYLASE in the last 72 hours. CBC: No results for input(s): WBC, NEUTROABS, HGB, HCT, MCV, PLT in the last 72 hours.  Cardiac Enzymes: No results for input(s): CKTOTAL, CKMB, CKMBINDEX, TROPONINI in the last 72 hours. BNP: Invalid input(s): POCBNP D-Dimer: No results for input(s): DDIMER in the last 72 hours. Hemoglobin A1C: No results for input(s): HGBA1C in the last 72 hours. Fasting Lipid Panel: No results for input(s): CHOL, HDL, LDLCALC, TRIG, CHOLHDL, LDLDIRECT in the last 72 hours. Thyroid Function Tests: No results for input(s): TSH, T4TOTAL, T3FREE, THYROIDAB in the last 72 hours.  Invalid input(s): FREET3 Anemia Panel: No results for input(s): VITAMINB12, FOLATE, FERRITIN, TIBC, IRON, RETICCTPCT in the last 72 hours.  No results found.  TELEMETRY: appears to be ST with HR of 142 bpm; however, atrial flutter is also suspected.   ASSESSMENT AND PLAN:  Principal Problem:   Sepsis ([place]) Active Problems:   Carcinoma of upper-outer quadrant of  female breast, left ([place])   Controlled type 2 diabetes mellitus with hyperglycemia ([place])   Essential (primary) hypertension   Sleep apnea   Morbid obesity ([place])   Cellulitis of left breast   Atrial fibrillation with RVR ([place])    # Atrial flutter versus ST  The patient was found to be in atrial fibrillation with RVR upon admission on 2/18. She converted spontaneously to NSR but went back into a-fib with RVR on 2/20. On 2/21 the patient appeared to be in [place]  with concerns for atrial flutter. On 2/22 she was given 5 mg of IV metoprolol in addition to oral metoprolol that was increased to 100mg  twice daily; however, this did not achieve rate control. Subsequently she was given 12mg  of IV adenosine which revealed an underlying rhythm of SB which was short-lived and the patient went back into ST with a HR of 143 bpm. Later that same afternoon she was given IV digoxin which also did not improve the rate. This morning 02/14/22, the patient underwent successful TEE/Cardioversion  performed by Dr. Ida Rogue and she is now in [place] with a HR of 62 bpm per beside telemetry. Please see Dr. Donivan Scull note in [facility] for further details.  The patient has a CHADsVASc score of 5. She has been tolerating eliquis therapy for anticoagulation.    - resume low carb, heart healthy diet.   - decrease metoprolol tartrate to 50 mg by mouth twice daily.   - continue amiodarone gtt at this time.    - continue diltiazem.   - continue eliquis for anticoagulation and stroke risk reduction. Eliquis should be continued as outpatient for at least 1 month and then re-evaluated the need per primary cardiologist.   - continuous telemetry monitoring until discharge.    # Severe AS Fairly stable. TEE results as above.   - conservative management at this time.   - CTS evaluation as outpatient.   # COPD exacerbation # Chronic respiratory failure with hypoxia Stable. Patient's dyspnea has improved. She continues to be on 2L of Oxygen via [place] with oxygen saturation >90.  - Agree with current management per hospitalist team.   - RT evaluation appreciated.    # Sepsis from left breast cellulitis s/p [place] left breast aspiration of fluid # Stage Ia invasive mammary carcinoma s/p lumpectomy and radiation  - Management per hospitality, oncology and radiology team.   #HLD  - continue atorvastatin.   #HTN Reasonably controlled.  - beta blocker and calcium channel blocker as above.   - heart  healthy diet.    #DM2 Reasonably controlled.    - Agree with current management with SSI.      [place], ACNPC-AG  02/17/2022 4:38 PM

## 2022-02-14 NOTE — Progress Notes (Signed)
Transesophageal Echocardiogram :  Indication: Atrial flutter with rapid rate Requesting/ordering  physician: [place]  Procedure: Benzocaine spray x2 and 2 mls x 2 of viscous lidocaine were given orally to provide local anesthesia to the oropharynx. The patient was positioned supine on the left side, bite block provided. The patient was moderately sedated with the doses of versed and fentanyl as detailed below.  Using digital technique an omniplane probe was advanced into the distal esophagus without incident.   Moderate sedation: 1. Sedation used: Per anesthesia  See report in EPIC  for complete details: In brief, transgastric imaging revealed normal LV function with no RWMAs and no mural apical thrombus.  .  Estimated ejection fraction was 55%.  Right sided cardiac chambers were normal with no evidence of pulmonary hypertension.  Imaging of the septum showed no ASD or VSD Bubble study was negative for shunt 2D and color flow confirmed no PFO  Trace MR, mild TR Moderate, possibly moderate to severe aortic valve stenosis aortic valve area estimated 0.9-1 cm Heavily calcified  The LA was well visualized in orthogonal views.  There was no spontaneous contrast and no thrombus in the LA and LA appendage   The descending thoracic aorta had no  mural aortic debris with no evidence of aneurysmal dilation or dissection  Cardioversion to follow  Please see formal report for complete details   Rhonda Thornton 02/14/2022 8:15 AM

## 2022-02-14 NOTE — Progress Notes (Signed)
Post procedure and upon return to assigned bed, discussed with Roselyn Reef, RN at bedside patient's concern regarding the blister on left breast near gauze/transparent dressing.  Patient states the blister formed since hospitalized.  Roselyn Reef, RN acknowledged/assessed area and states will discuss with medical team.

## 2022-02-14 NOTE — CV Procedure (Signed)
Cardioversion procedure note For atrial flutter with rapid rate  Procedure Details:  Consent: Risks of procedure as well as the alternatives and risks of each were explained to the (patient/caregiver).  Consent for procedure obtained.  Time Out: Verified patient identification, verified procedure, site/side was marked, verified correct patient position, special equipment/implants available, medications/allergies/relevent history reviewed, required imaging and test results available.  Performed  Patient placed on cardiac monitor, pulse oximetry, supplemental oxygen as necessary.   Sedation given: propofol IV, Dr. Rosey Bath Pacer pads placed anterior and posterior chest.   Cardioverted 1 time(s).   Cardioverted at 150 J. Synchronized biphasic Converted to NSR   Evaluation: Findings: Post procedure EKG shows: NSR Complications: None Patient did tolerate procedure well.  Time Spent Directly with the Patient:  48 minutes   Esmond Plants, M.D., Ph.D.

## 2022-02-14 NOTE — Progress Notes (Incomplete)
[facility] Cardiology    SUBJECTIVE: ***   Vitals:   02/14/22 0815 02/14/22 0845 02/14/22 0954 02/14/22 1158  BP: (!) 100/55 97/60 (!) 89/50 103/62  Pulse: (!) 51 64 64 65  Resp: 19 (!) 23 19 17   Temp:   98 F (36.7 C) 98.3 F (36.8 C)  TempSrc:      SpO2: 100% 95% 95% 97%  Weight:      Height:         Intake/Output Summary (Last 24 hours) at 02/14/2022 1344 Last data filed at 02/14/2022 1000 Gross per 24 hour  Intake 480 ml  Output 2350 ml  Net -1870 ml      PHYSICAL EXAM  General: Well developed, well nourished, in no acute distress HEENT:  Normocephalic and atramatic Neck:  No JVD.  Lungs: Clear bilaterally to auscultation and percussion. Heart: HRRR . Normal S1 and S2 without gallops or murmurs.  Abdomen: Bowel sounds are positive, abdomen soft and non-tender  Msk:  Back normal, normal gait. Normal strength and tone for age. Extremities: No clubbing, cyanosis or edema.   Neuro: Alert and oriented X 3. Psych:  Good affect, responds appropriately   LABS: Basic Metabolic Panel: Recent Labs    02/13/22 0625 02/14/22 0557  NA 136 136  K 4.2 3.8  CL 97* 99  CO2 32 29  GLUCOSE 214* 207*  BUN 16 17  CREATININE 0.55 0.53  CALCIUM 8.7* 8.6*  MG 2.2 1.9  PHOS  --  4.2   Liver Function Tests: No results for input(s): AST, ALT, ALKPHOS, BILITOT, PROT, ALBUMIN in the last 72 hours. No results for input(s): LIPASE, AMYLASE in the last 72 hours. CBC: Recent Labs    02/13/22 0625 02/14/22 0557  WBC 7.9 7.2  HGB 10.0* 9.4*  HCT 31.8* 29.9*  MCV 86.9 87.4  PLT 257 263   Cardiac Enzymes: No results for input(s): CKTOTAL, CKMB, CKMBINDEX, TROPONINI in the last 72 hours. BNP: Invalid input(s): POCBNP D-Dimer: No results for input(s): DDIMER in the last 72 hours. Hemoglobin A1C: No results for input(s): HGBA1C in the last 72 hours. Fasting Lipid Panel: No results for input(s): CHOL, HDL, LDLCALC, TRIG, CHOLHDL, LDLDIRECT in the last 72 hours. Thyroid Function  Tests: No results for input(s): TSH, T4TOTAL, T3FREE, THYROIDAB in the last 72 hours.  Invalid input(s): FREET3 Anemia Panel: No results for input(s): VITAMINB12, FOLATE, FERRITIN, TIBC, IRON, RETICCTPCT in the last 72 hours.  ECHO TEE  Result Date: 02/14/2022    TRANSESOPHOGEAL ECHO REPORT   Patient Name:   AARIAN CLEAVER Date of Exam: 02/14/2022 Medical Rec #:  [id number]      Height:       64.0 in Accession #:    [id number]     Weight:       240.0 lb Date of Birth:  November 15, 1951      BSA:          2.114 m Patient Age:    12 years       BP:           100/55 mmHg Patient Gender: F              HR:           51 bpm. Exam Location:  [facility] Procedure: Transesophageal Echo, Cardiac Doppler, Color Doppler and Saline            Contrast Bubble Study Indications:     Atrial Flutter I48.92  History:  Patient has prior history of Echocardiogram examinations, most                  recent 02/10/2022. Signs/Symptoms:Murmur; Risk                  Factors:Hypertension and Dyslipidemia.  Sonographer:     Sherrie Sport Referring Phys:  Bond Diagnosing Phys: Ida Rogue MD PROCEDURE: After discussion of the risks and benefits of a TEE, an informed consent was obtained from the patient. TEE procedure time was 30 minutes. The transesophogeal probe was passed without difficulty through the esophogus of the patient. Imaged were obtained with the patient in a left lateral decubitus position. Local oropharyngeal anesthetic was provided with Benzocaine spray and Cetacaine. Sedation performed by different physician. Patients was under conscious sedation during this procedure. The patient's vital signs; including heart rate, blood pressure, and oxygen saturation; remained stable throughout the procedure. The patient developed no complications during the procedure. A successful direct current cardioversion was performed at 150 joules with 1 attempt. IMPRESSIONS  1. Left ventricular ejection fraction, by estimation,  is 55 to 60%. The left ventricle has normal function. The left ventricle has no regional wall motion abnormalities. There is mild left ventricular hypertrophy.  2. Right ventricular systolic function is mildly reduced. The right ventricular size is normal.  3. Left atrial size was moderately dilated. No left atrial/left atrial appendage thrombus was detected.  4. The mitral valve is normal in structure. Trivial mitral valve regurgitation. No evidence of mitral stenosis.  5. The aortic valve is tricuspid. There is severe calcifcation of the aortic valve. Aortic valve regurgitation is not visualized. Moderate to severe aortic valve stenosis by planar measurement, 0.9 to 1 cm sq. Non-coronary cusp is relatively non-mobile.  3D images obtained.  6. The inferior vena cava is normal in size with greater than 50% respiratory variability, suggesting right atrial pressure of 3 mmHg.  7. Right atrial size was mildly dilated.  8. Agitated saline contrast bubble study was negative, with no evidence of any interatrial shunt. Conclusion(s)/Recommendation(s): Normal biventricular function without evidence of hemodynamically significant valvular heart disease. FINDINGS  Left Ventricle: Left ventricular ejection fraction, by estimation, is 55 to 60%. The left ventricle has normal function. The left ventricle has no regional wall motion abnormalities. The left ventricular internal cavity size was normal in size. There is  mild left ventricular hypertrophy. Right Ventricle: The right ventricular size is normal. No increase in right ventricular wall thickness. Right ventricular systolic function is mildly reduced. Left Atrium: Left atrial size was moderately dilated. No left atrial/left atrial appendage thrombus was detected. Right Atrium: Right atrial size was mildly dilated. Pericardium: There is no evidence of pericardial effusion. Mitral Valve: The mitral valve is normal in structure. Trivial mitral valve regurgitation. No evidence  of mitral valve stenosis. Tricuspid Valve: The tricuspid valve is normal in structure. Tricuspid valve regurgitation is mild . No evidence of tricuspid stenosis. Aortic Valve: The aortic valve is tricuspid. There is severe calcifcation of the aortic valve. Aortic valve regurgitation is not visualized. Moderate to severe aortic stenosis is present. Pulmonic Valve: The pulmonic valve was normal in structure. Pulmonic valve regurgitation is not visualized. No evidence of pulmonic stenosis. Aorta: The aortic root is normal in size and structure. Venous: The inferior vena cava is normal in size with greater than 50% respiratory variability, suggesting right atrial pressure of 3 mmHg. IAS/Shunts: No atrial level shunt detected by color flow Doppler. Agitated saline contrast  was given intravenously to evaluate for intracardiac shunting. Agitated saline contrast bubble study was negative, with no evidence of any interatrial shunt. Ida Rogue MD Electronically signed by Ida Rogue MD Signature Date/Time: 02/14/2022/9:46:28 AM    Final      Echo ***  TELEMETRY: ***:  ASSESSMENT AND PLAN:  Principal Problem:   Sepsis ([place]) Active Problems:   Carcinoma of upper-outer quadrant of female breast, left ([place])   Type 2 diabetes mellitus without complications ([place])   Essential (primary) hypertension   Sleep apnea   Morbid obesity ([place])   Cellulitis of left breast   Atrial fibrillation with RVR ([place])    1. ***   Yolonda Kida, MD 02/14/2022 1:44 PM

## 2022-02-14 NOTE — Anesthesia Procedure Notes (Signed)
Date/Time: 02/14/2022 7:50 AM Performed by: Doreen Salvage, CRNA Pre-anesthesia Checklist: Patient identified, Emergency Drugs available, Suction available and Patient being monitored Patient Re-evaluated:Patient Re-evaluated prior to induction Oxygen Delivery Method: Supernova nasal CPAP Induction Type: IV induction Dental Injury: Teeth and Oropharynx as per pre-operative assessment  Comments: Nasal cannula with etCO2 monitoring

## 2022-02-15 DIAGNOSIS — I10 Essential (primary) hypertension: Secondary | ICD-10-CM

## 2022-02-15 LAB — GLUCOSE, CAPILLARY
Glucose-Capillary: 209 mg/dL — ABNORMAL HIGH (ref 70–99)
Glucose-Capillary: 201 mg/dL — ABNORMAL HIGH (ref 70–99)
Glucose-Capillary: 193 mg/dL — ABNORMAL HIGH (ref 70–99)
Glucose-Capillary: 196 mg/dL — ABNORMAL HIGH (ref 70–99)

## 2022-02-15 MED ORDER — AMIODARONE HCL 200 MG PO TABS
200.0000 mg | ORAL_TABLET | Freq: Every day | ORAL | Status: DC
  Administered 2022-02-15 – 2022-02-18 (×4): 200 mg via ORAL
  Filled 2022-02-15 (×4): qty 1

## 2022-02-15 NOTE — NC FL2 (Addendum)
[place] LEVEL OF CARE SCREENING TOOL     IDENTIFICATION  Patient Name: Rhonda Thornton Birthdate: 1951/11/19 Sex: female Admission Date (Current Location): 02/08/2022  [facility] and [place] Number:  Engineering geologist and Address:  [facility], [address], [place],  [address]      Provider Number: [id number]  Attending Physician Name and Address:  Edwin Dada, *  Relative Name and Phone Number:  [name] (Brother)   [phone] (Home Phone)    Current Level of Care: Hospital Recommended Level of Care: [place] Prior Approval Number:    Date Approved/Denied:   PASRR Number: [id number]  Discharge Plan:      Current Diagnoses: Patient Active Problem List   Diagnosis Date Noted   Sepsis ([place]) 02/08/2022   Cellulitis of left breast 02/08/2022   Atrial fibrillation with RVR ([place]) 02/08/2022   Secondary seroma of breast 12/12/2021   Morbid obesity ([place]) 12/12/2021   Pain in joint of right shoulder 11/19/2021   Pain of left hip joint 11/19/2021   Pain in joint of left knee 07/03/2021   Arthritis of right knee 07/03/2021   Carcinoma of upper-outer quadrant of female breast, left ([place]) 05/02/2021   Gastroparesis due to DM ([place]) 10/22/2017   Cardiac murmur 06/09/2016   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 12/29/2008   Type 2 diabetes mellitus without complications ([place]) 37/03/8888   Essential (primary) hypertension 03/04/2008   Hyperlipidemia 12/08/2007   Sleep apnea 12/08/2007    Orientation RESPIRATION BLADDER Height & Weight     Self, Time, Situation, Place  O2 (2L) External catheter, Incontinent Weight: 240 lb (108.9 kg) Height:  5\' 4"  (162.6 cm)  BEHAVIORAL SYMPTOMS/MOOD NEUROLOGICAL BOWEL NUTRITION STATUS        Diet (heart health/carb modified)  AMBULATORY STATUS COMMUNICATION OF NEEDS Skin   Limited Assist Verbally Bruising, Other (Comment) (skin  tears)                       Personal Care Assistance Level of Assistance  Bathing, Feeding, Dressing Bathing Assistance: Limited assistance Feeding assistance: Independent Dressing Assistance: Limited assistance     Functional Limitations Info             SPECIAL CARE FACTORS FREQUENCY  PT (By licensed PT), OT (By licensed OT)     PT Frequency: 5 times per week OT Frequency: 5 times per week            Contractures      Additional Factors Info  Code Status, Allergies Code Status Info: full code Allergies Info: nka           Current Medications (02/15/2022):  This is the current hospital active medication list Current Facility-Administered Medications  Medication Dose Route Frequency Provider Last Rate Last Admin   acetaminophen (TYLENOL) tablet 650 mg  650 mg Oral Q6H PRN Agbata, Tochukwu, MD   650 mg at 02/10/22 1694   Or   acetaminophen (TYLENOL) suppository 650 mg  650 mg Rectal Q6H PRN Agbata, Tochukwu, MD       alum & mag hydroxide-simeth (MAALOX/MYLANTA) 200-200-20 MG/5ML suspension 15 mL  15 mL Oral Q6H PRN Enzo Bi, MD   15 mL at 02/14/22 2059   amiodarone (PACERONE) tablet 200 mg  200 mg Oral Daily Callwood, Dwayne D, MD   200 mg at 02/15/22 1210   apixaban (ELIQUIS) tablet 5 mg  5 mg Oral BID Enzo Bi, MD  5 mg at 02/15/22 0913   atorvastatin (LIPITOR) tablet 80 mg  80 mg Oral QPM Agbata, Tochukwu, MD   80 mg at 02/14/22 1719   calcium carbonate (TUMS - dosed in mg elemental calcium) chewable tablet 200 mg of elemental calcium  1 tablet Oral TID PRN Enzo Bi, MD   200 mg of elemental calcium at 02/13/22 1608   ceFAZolin (ANCEF) IVPB 2g/100 mL premix  2 g Intravenous Q8H Berton Mount, RPH 200 mL/hr at 02/15/22 1447 2 g at 02/15/22 1447   Chlorhexidine Gluconate Cloth 2 % PADS 6 each  6 each Topical Daily Sharion Settler, NP   6 each at 02/15/22 0914   diltiazem (CARDIZEM CD) 24 hr capsule 240 mg  240 mg Oral Daily Enzo Bi, MD   240 mg at  02/13/22 0917   dorzolamide-timolol (COSOPT) 22.3-6.8 MG/ML ophthalmic solution 1 drop  1 drop Both Eyes BID Agbata, Tochukwu, MD   1 drop at 02/15/22 0914   fesoterodine (TOVIAZ) tablet 4 mg  4 mg Oral Daily Agbata, Tochukwu, MD   4 mg at 02/15/22 0913   fluticasone (FLONASE) 50 MCG/ACT nasal spray 2 spray  2 spray Each Nare Daily Agbata, Tochukwu, MD   2 spray at 02/15/22 0914   HYDROcodone-acetaminophen (NORCO) 7.5-325 MG per tablet 1-2 tablet  1-2 tablet Oral Q6H PRN Enzo Bi, MD   2 tablet at 02/10/22 1628   insulin aspart (novoLOG) injection 0-15 Units  0-15 Units Subcutaneous TID WC Agbata, Tochukwu, MD   5 Units at 02/15/22 1210   insulin glargine-yfgn (SEMGLEE) injection 5 Units  5 Units Subcutaneous Daily Shawna Clamp, MD   5 Units at 02/15/22 0912   latanoprost (XALATAN) 0.005 % ophthalmic solution 1 drop  1 drop Both Eyes QHS Agbata, Tochukwu, MD   1 drop at 02/14/22 2101   letrozole [facility]) tablet 2.5 mg  2.5 mg Oral Daily Agbata, Tochukwu, MD   2.5 mg at 02/15/22 0914   loratadine (CLARITIN) tablet 10 mg  10 mg Oral Daily Agbata, Tochukwu, MD   10 mg at 02/15/22 0914   metoprolol tartrate (LOPRESSOR) tablet 50 mg  50 mg Oral BID White, Delicia, NP   50 mg at 02/14/22 2056   multivitamin with minerals tablet 1 tablet  1 tablet Oral Daily Agbata, Tochukwu, MD   1 tablet at 02/15/22 0912   ondansetron (ZOFRAN) injection 4 mg  4 mg Intravenous Q6H PRN Agbata, Tochukwu, MD   4 mg at 02/14/22 0605   pantoprazole (PROTONIX) EC tablet 40 mg  40 mg Oral Daily Agbata, Tochukwu, MD   40 mg at 02/15/22 0914   polyethylene glycol (MIRALAX / GLYCOLAX) packet 17 g  17 g Oral BID PRN Shawna Clamp, MD   17 g at 02/14/22 2056   polyvinyl alcohol (LIQUIFILM TEARS) 1.4 % ophthalmic solution 1 drop  1 drop Both Eyes Daily Agbata, Tochukwu, MD   1 drop at 02/15/22 0914   pregabalin (LYRICA) capsule 100 mg  100 mg Oral TID Agbata, Tochukwu, MD   100 mg at 02/15/22 1441   senna-docusate (Senokot-S)  tablet 2 tablet  2 tablet Oral BID Shawna Clamp, MD   2 tablet at 02/15/22 0913   sertraline (ZOLOFT) tablet 100 mg  100 mg Oral Daily Agbata, Tochukwu, MD   100 mg at 02/15/22 0913   sodium chloride (OCEAN) 0.65 % nasal spray 1 spray  1 spray Each Nare PRN Enzo Bi, MD   1 spray at 02/14/22 2227  Discharge Medications: Please see discharge summary for a list of discharge medications.  Relevant Imaging Results:  Relevant Lab Results:   Additional Information SS #: [id number]  [place], LCSW

## 2022-02-15 NOTE — Assessment & Plan Note (Signed)
-   Outpatient follow up with Oncology and General Surgery

## 2022-02-15 NOTE — Assessment & Plan Note (Signed)
BP normal -Continue metoprolol, dilt

## 2022-02-15 NOTE — Assessment & Plan Note (Addendum)
Noncompliant with CPAP 

## 2022-02-15 NOTE — Assessment & Plan Note (Addendum)
Glucose normalized - Continue sliding scale corrections - Continue mealtime insulin - Continue glargine

## 2022-02-15 NOTE — Evaluation (Signed)
Physical Therapy Evaluation Patient Details Name: Rhonda Thornton MRN: [id number] DOB: [birth year] Today's Date: 02/15/2022  History of Present Illness  71 y.o. female who presents to the ED w/ EMS after a fall. Pt states she has felt lightheaded and weak over the past days; found to be in A-Fib w/ RVR w/ EMS. US guided aspiration yielded 165mL dark red/brown fluid. PmHx: History of Left-sided breast cancer status post lumpectomy and radiation, HTN, HLD, obesity, T2DM, history of seizure disorder.   Clinical Impression  Pt is a 71 year old F admitted to hospital on 02/08/22 for fall and sepsis. At baseline, pt was mod I for ADL's, IADL's (light meals), medication management, transportation, and ambulation with AD. Pt presents with generalized weakness, decreased activity tolerance, fear of falling, decreased balance, increased O2 dependence from baseline, and decreased cardiopulmonary tolerance to activity, resulting in impaired functional mobility from baseline. Due to deficits, pt required min assist for bed mobility, min-mod assist for transfers, and min assist for limited gait at bedside with RW. Required x1 seated rest break after taking a few steps due to weakness and c/o fear of falling. Improved participation with encouragement and cues for sequencing/direction of events. Able to wean O2 from 2L to 1.5L at end of session, with pt SpO2 at 93% on 1.5L at rest. SpO2 >90% throughout session, but labored breathing noted. Deficits limit the pt's ability to safely and independently perform ADL's, transfer, and ambulate. Pt will benefit from acute skilled PT services to address deficits for return to baseline function. At this time, PT recommends SNF at DC to address deficits and improve overall safety with functional mobility; pt agreeable.        Recommendations for follow up therapy are one component of a multi-disciplinary discharge planning process, led by the attending physician.  Recommendations may  be updated based on patient status, additional functional criteria and insurance authorization.  Follow Up Recommendations Skilled nursing-short term rehab (<3 hours/day)    Assistance Recommended at Discharge Intermittent Supervision/Assistance  Patient can return home with the following  A lot of help with walking and/or transfers;A lot of help with bathing/dressing/bathroom;Assist for transportation;Assistance with cooking/housework;Help with stairs or ramp for entrance    Equipment Recommendations  (defer to post acute)     Functional Status Assessment Patient has had a recent decline in their functional status and demonstrates the ability to make significant improvements in function in a reasonable and predictable amount of time.     Precautions / Restrictions Precautions Precautions: Fall Restrictions Weight Bearing Restrictions: No      Mobility  Bed Mobility Overal bed mobility: Needs Assistance Bed Mobility: Supine to Sit     Supine to sit: Min assist, HOB elevated     General bed mobility comments: trunk facilitation to sit EOB, HOB elevated, use of BUE for support; increased time/effort    Transfers Overall transfer level: Needs assistance Equipment used: Rolling walker (2 wheels) Transfers: Sit to/from Stand Sit to Stand: Mod assist, Min assist, From elevated surface           General transfer comment: initially mod assist for power to stand from EOB with RW, progressing to min assist; both trials with RW    Ambulation/Gait Ambulation/Gait assistance: Min assist Gait Distance (Feet): 4 Feet (43ft x2 with seated rest break) Assistive device: Rolling walker (2 wheels)         General Gait Details: Min assist for balance and RW negotiation. Required seated rest break with limited mobility  at bedside. Demonstrates slowed cadence, decreased step length (L>R), decreased foot clearance (R>L), forward flexed posture, and increased UE WB on RW for steadying.  Encouragement and cues provided during gait.     Balance Overall balance assessment: Needs assistance Sitting-balance support: No upper extremity supported, Feet unsupported Sitting balance-Leahy Scale: Good     Standing balance support: Bilateral upper extremity supported, During functional activity Standing balance-Leahy Scale: Poor Standing balance comment: min assist in RW                             Pertinent Vitals/Pain Pain Assessment Pain Assessment: No/denies pain    Home Living Family/patient expects to be discharged to:: Private residence Living Arrangements: Alone Available Help at Discharge: Family;Available PRN/intermittently (Brother/SIL) Type of Home: Mobile home Home Access: Stairs to enter Entrance Stairs-Rails: Chemical engineer of Steps: 5   Home Layout: One level Home Equipment: Cane - single point;Hand held shower head Additional Comments: 3ww    Prior Function Prior Level of Function : Independent/Modified Independent;History of Falls (last six months)             Mobility Comments: Mod I for ambulation with SPC vs. RW ADLs Comments: Mod I for ADL's, IADL's (simple meals), medication management, and driving. Wears 2L O2 via [place] at night.        Extremity/Trunk Assessment   Upper Extremity Assessment Upper Extremity Assessment: Generalized weakness (Shoulder flexion 3+/5, otherwise grossly 4+/5; sensation intact.)    Lower Extremity Assessment Lower Extremity Assessment: Generalized weakness (RLE grossly 3+ to 4/5; LLE grossly 3+ to 4+/5; sensation intact, coordination intact)       Communication   Communication: No difficulties  Cognition Arousal/Alertness: Awake/alert Behavior During Therapy: WFL for tasks assessed/performed Overall Cognitive Status: Within Functional Limits for tasks assessed                                 General Comments: A&O x4; able to follow 100% of simple 2-step  commands        General Comments General comments (skin integrity, edema, etc.): R hand IV disloged during session; RN notified. 2L O2 via [place] with SpO2 >/= 93%, able to be weaned to 1.5L with SpO2 at 93% at rest. Initial severe dizziness with supine>sit and sit>stand that alleviated with time and cues for pursed lip breathing.    Exercises Other Exercises Other Exercises: Bed mobility, transfers, and minimal gait with RW. Increased time for rest breaks and c/o dizziness with positional changes. Educated re: PT role/POC, DC recommendations, O2, safety with mobility, OOB for toileting.   Assessment/Plan    PT Assessment Patient needs continued PT services  PT Problem List Decreased strength;Decreased activity tolerance;Decreased balance;Decreased mobility;Cardiopulmonary status limiting activity;Obesity       PT Treatment Interventions Gait training;Stair training;Functional mobility training;Therapeutic activities;Therapeutic exercise;Balance training;Neuromuscular re-education    PT Goals (Current goals can be found in the Care Plan section)  Acute Rehab PT Goals Patient Stated Goal: "walk again" PT Goal Formulation: With patient Time For Goal Achievement: 03/01/22 Potential to Achieve Goals: Good    Frequency Min 2X/week        AM-PAC PT "6 Clicks" Mobility  Outcome Measure Help needed turning from your back to your side while in a flat bed without using bedrails?: A Little Help needed moving from lying on your back to sitting on the side of a  flat bed without using bedrails?: A Little Help needed moving to and from a bed to a chair (including a wheelchair)?: A Little Help needed standing up from a chair using your arms (e.g., wheelchair or bedside chair)?: A Lot Help needed to walk in hospital room?: A Little Help needed climbing 3-5 steps with a railing? : A Lot 6 Click Score: 16    End of Session Equipment Utilized During Treatment: Gait belt;Oxygen (2L to  1.5L) Activity Tolerance: Patient tolerated treatment well;Patient limited by fatigue Patient left: in chair;with call bell/phone within reach;with chair alarm set Nurse Communication: Mobility status (vitals/O2) PT Visit Diagnosis: Unsteadiness on feet (R26.81);Muscle weakness (generalized) (M62.81);History of falling (Z91.81);Difficulty in walking, not elsewhere classified (R26.2)    Time: 8032-1224 PT Time Calculation (min) (ACUTE ONLY): 36 min   Charges:   PT Evaluation $PT Eval Low Complexity: 1 Low PT Treatments $Therapeutic Activity: 8-22 mins         Herminio Commons, PT, DPT 2:51 PM,02/15/22

## 2022-02-15 NOTE — Progress Notes (Signed)
Progress Note   Patient: Rhonda Thornton WFU:[id number] DOB: [birth year] DOA: 02/08/2022     7 DOS: the patient was seen and examined on 02/15/2022        Brief hospital course: Rhonda Thornton is a 71 y.o. 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 Patient states that she has not felt well for the last 3 days and has been weak, fatigued, has had myalgias as well as fever and chills. She called EMS and when they arrived she was found to be in A-fib with a rapid ventricular rate with heart rates between 160 and 180. She complains of feeling very sore in her left breast and has had nausea.  2/19: Cardiology consulted for Afib with RVR 2/21: Gen Surg consulted for breast seroma, IR aspirated seroma, few cells, rare staph epi 2/24: Underwent TEE with DCCV for Afib, now back in sinus       Assessment and Plan: * Sepsis ([place])- (present on admission) P/w tachypnea, fever and left breast cellulitis.   Abscess culture with rare staph epi, pan-sensitive -Continue cefazolin - Consult General Surgery, appreciate cares  Cellulitis of left breast- (present on admission) See above  Atrial fibrillation with RVR ([place])- (present on admission) - Transtiion to PO amiodarone -Continue eliquis -Continue diltiazem - Keep K>4, mag>2 -Continue metoprolol  Morbid obesity ([place])- (present on admission) BMI 41  Sleep apnea- (present on admission) - CPAP at night  Essential (primary) hypertension- (present on admission) BP normal -Continue metoprolol, dilt  Type 2 diabetes mellitus without complications (HCC) Glucose slightly elevated - Continue sliding scale corrections-continue glargine  Carcinoma of  upper-outer quadrant of female breast, left ([place])- (present on admission) - Outpatient follow up with Oncology and General Surgery        Subjective: Patient with more pain and redness and swelling of her left breast.  No fever, confusion, vomiting.  Pain, palpitations  Physical Exam: Vitals:   02/15/22 0021 02/15/22 0411 02/15/22 0731 02/15/22 1131  BP: 94/66 (!) 104/49 (!) 100/52 106/60  Pulse: 70 67 64 64  Resp: 20  17 17   Temp: 98.6 F (37 C) 98.2 F (36.8 C) 98.4 F (36.9 C) 99.3 F (37.4 C)  TempSrc:   Oral Oral  SpO2: 92% 93% 96% 100%  Weight:      Height:       Obese adult female, sitting up in bed, no acute distress. RRR, no murmurs, no peripheral edema Respiratory rate normal, lungs clear without rales or wheezes The left breast is hard and indurated, there is a large very firm and tender subcutaneous mass, she has peau d'orange and redness extending mostly around the left breast. Mental status normal, face symmetric, speech fluent, moves upper extremities with generalized weakness but symmetric strength  Data Reviewed: Discussed with general surgery Labs and imaging are notable for anemia hemoglobin 9.4, no change from previous, TEE shows normal EF, no significant valvular disease other than AS with cross-sectional area around 1 cm Basic metabolic panel unremarkable Glucose slightly elevated  Family Communication: Family at the bedside  Disposition: Status is: Inpatient Remains inpatient appropriate because: The patient will require significant rehabilitation return to her prior level of function.  Ongoing search for SNF is pending.  Planned Discharge Destination: Skilled nursing facility           Author: Edwin Dada, MD 02/15/2022 2:00 PM  For on call review [email or web address].

## 2022-02-15 NOTE — Assessment & Plan Note (Signed)
BMI 41 

## 2022-02-15 NOTE — Assessment & Plan Note (Signed)
See above

## 2022-02-15 NOTE — Assessment & Plan Note (Addendum)
Underwent DCCv and now in sinus.  Stable HR. - Continue amiodarone - Continue eliquis - Continue diltiazem, metoporlol - Keep K>4, mag>2

## 2022-02-15 NOTE — TOC Initial Note (Signed)
Transition of Care [facility]) - Initial/Assessment Note    Patient Details  Name: Rhonda Thornton MRN: [id number] Date of Birth: 12-28-1950  Transition of Care [facility]) CM/SW Contact:    Magnus Ivan, LCSW Phone Number: 02/15/2022, 4:18 PM  Clinical Narrative:             CSW spoke with patient regarding DC planning. Patient lives alone and drives herself to appointments at baseline.     [facility]P is [facility]. [facility]acy is [facility].  Patient has a RW and cane.  Patient agreeable to SNF. SNF work up started.    Expected Discharge Plan: Skilled Nursing Facility Barriers to Discharge: Continued Medical Work up   Patient Goals and CMS Choice Patient states their goals for this hospitalization and ongoing recovery are:: SNF CMS Medicare.gov Compare Post Acute Care list provided to:: Patient Choice offered to / list presented to : Patient  Expected Discharge Plan and Services Expected Discharge Plan: [place]       Living arrangements for the past 2 months: Single Family Home                                      Prior Living Arrangements/Services Living arrangements for the past 2 months: Single Family Home Lives with:: Self Patient language and need for interpreter reviewed:: Yes Do you feel safe going back to the place where you live?: Yes      Need for Family Participation in Patient Care: Yes (Comment) Care giver support system in place?: Yes (comment) Current home services: DME Criminal Activity/Legal Involvement Pertinent to Current Situation/Hospitalization: No - Comment as needed  Activities of Daily Living Home Assistive Devices/Equipment: Gilford Rile (specify type) ADL Screening (condition at time of admission) Patient's cognitive ability adequate to safely complete daily activities?: Yes Is the patient deaf or have difficulty hearing?: No Does the patient have difficulty seeing, even when wearing glasses/contacts?: Yes Does the patient have  difficulty concentrating, remembering, or making decisions?: No Patient able to express need for assistance with ADLs?: Yes Does the patient have difficulty dressing or bathing?: No Independently performs ADLs?: Yes (appropriate for developmental age) Does the patient have difficulty walking or climbing stairs?: Yes Weakness of Legs: Both Weakness of Arms/Hands: Left  Permission Sought/Granted Permission sought to share information with : Facility Art therapist granted to share information with : Yes, Verbal Permission Granted     Permission granted to share info w AGENCY: SNFs        Emotional Assessment       Orientation: : Oriented to Self, Oriented to Place, Oriented to  Time, Oriented to Situation Alcohol / Substance Use: Not Applicable Psych Involvement: No (comment)  Admission diagnosis:  Atrial fibrillation with RVR ([place]) [I48.91] Sepsis ([place]) [A41.9] Cellulitis of left breast [N61.0] Acute sepsis [facility]) [A41.9] Patient Active Problem List   Diagnosis Date Noted   Sepsis ([place]) 02/08/2022   Cellulitis of left breast 02/08/2022   Atrial fibrillation with RVR ([place]) 02/08/2022   Secondary seroma of breast 12/12/2021   Morbid obesity ([place]) 12/12/2021   Pain in joint of right shoulder 11/19/2021   Pain of left hip joint 11/19/2021   Pain in joint of left knee 07/03/2021   Arthritis of right knee 07/03/2021   Carcinoma of upper-outer quadrant of female breast, left ([place]) 05/02/2021   Gastroparesis due to DM ([place]) 10/22/2017   Cardiac murmur 06/09/2016  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 12/29/2008   Type 2 diabetes mellitus without complications ([place]) 22/57/5051   Essential (primary) hypertension 03/04/2008   Hyperlipidemia 12/08/2007   Sleep apnea 12/08/2007   PCP:  Denton Lank, MD Pharmacy:   [facility], [address] [place]  [id number] Phone: [phone] Fax: [phone]  [id number] [place], [place] - [address] [place] [address] Phone: [phone] Fax: [phone]     Social Determinants of Health (SDOH) Interventions    Readmission Risk Interventions Readmission Risk Prevention Plan 02/15/2022  Transportation Screening Complete  PCP or Specialist Appt within 5-7 Days Complete  Home Care Screening Complete  Medication Review (RN CM) Complete  Some recent data might be hidden

## 2022-02-15 NOTE — Progress Notes (Signed)
[facility] Cardiology    SUBJECTIVE: States to be doing reasonably well denies any palpitation tachycardia no pain no fever feels reasonably well   Vitals:   02/15/22 0021 02/15/22 0411 02/15/22 0731 02/15/22 1131  BP: 94/66 (!) 104/49 (!) 100/52 106/60  Pulse: 70 67 64 64  Resp: 20  17 17   Temp: 98.6 F (37 C) 98.2 F (36.8 C) 98.4 F (36.9 C) 99.3 F (37.4 C)  TempSrc:   Oral Oral  SpO2: 92% 93% 96% 100%  Weight:      Height:         Intake/Output Summary (Last 24 hours) at 02/15/2022 1133 Last data filed at 02/15/2022 1962 Gross per 24 hour  Intake 2269.6 ml  Output 800 ml  Net 1469.6 ml      PHYSICAL EXAM  General: Well developed, well nourished, in no acute distress HEENT:  Normocephalic and atramatic Neck:  No JVD.  Lungs: Clear bilaterally to auscultation and percussion. Heart: HRRR . Normal S1 and S2 without gallops or murmurs.  Abdomen: Bowel sounds are positive, abdomen soft and non-tender  Msk:  Back normal, normal gait. Normal strength and tone for age. Extremities: No clubbing, cyanosis or edema.   Neuro: Alert and oriented X 3. Psych:  Good affect, responds appropriately   LABS: Basic Metabolic Panel: Recent Labs    02/13/22 0625 02/14/22 0557  NA 136 136  K 4.2 3.8  CL 97* 99  CO2 32 29  GLUCOSE 214* 207*  BUN 16 17  CREATININE 0.55 0.53  CALCIUM 8.7* 8.6*  MG 2.2 1.9  PHOS  --  4.2   Liver Function Tests: No results for input(s): AST, ALT, ALKPHOS, BILITOT, PROT, ALBUMIN in the last 72 hours. No results for input(s): LIPASE, AMYLASE in the last 72 hours. CBC: Recent Labs    02/13/22 0625 02/14/22 0557  WBC 7.9 7.2  HGB 10.0* 9.4*  HCT 31.8* 29.9*  MCV 86.9 87.4  PLT 257 263   Cardiac Enzymes: No results for input(s): CKTOTAL, CKMB, CKMBINDEX, TROPONINI in the last 72 hours. BNP: Invalid input(s): POCBNP D-Dimer: No results for input(s): DDIMER in the last 72 hours. Hemoglobin A1C: No results for input(s): HGBA1C in the last  72 hours. Fasting Lipid Panel: No results for input(s): CHOL, HDL, LDLCALC, TRIG, CHOLHDL, LDLDIRECT in the last 72 hours. Thyroid Function Tests: No results for input(s): TSH, T4TOTAL, T3FREE, THYROIDAB in the last 72 hours.  Invalid input(s): FREET3 Anemia Panel: No results for input(s): VITAMINB12, FOLATE, FERRITIN, TIBC, IRON, RETICCTPCT in the last 72 hours.  ECHO TEE  Result Date: 02/14/2022    TRANSESOPHOGEAL ECHO REPORT   Patient Name:   Rhonda Thornton Date of Exam: 02/14/2022 Medical Rec #:  [id number]      Height:       64.0 in Accession #:    [id number]     Weight:       240.0 lb Date of Birth:  [birth year]      BSA:          2.114 m Patient Age:    71 years       BP:           100/55 mmHg Patient Gender: F              HR:           51 bpm. Exam Location:  [facility] Procedure: Transesophageal Echo, Cardiac Doppler, Color Doppler and Saline  Contrast Bubble Study Indications:     Atrial Flutter I48.92  History:         Patient has prior history of Echocardiogram examinations, most                  recent 02/10/2022. Signs/Symptoms:Murmur; Risk                  Factors:Hypertension and Dyslipidemia.  Sonographer:     Sherrie Sport Referring Phys:  Benjamin Perez Diagnosing Phys: Ida Rogue MD PROCEDURE: After discussion of the risks and benefits of a TEE, an informed consent was obtained from the patient. TEE procedure time was 30 minutes. The transesophogeal probe was passed without difficulty through the esophogus of the patient. Imaged were obtained with the patient in a left lateral decubitus position. Local oropharyngeal anesthetic was provided with Benzocaine spray and Cetacaine. Sedation performed by different physician. Patients was under conscious sedation during this procedure. The patient's vital signs; including heart rate, blood pressure, and oxygen saturation; remained stable throughout the procedure. The patient developed no complications during the procedure. A  successful direct current cardioversion was performed at 150 joules with 1 attempt. IMPRESSIONS  1. Left ventricular ejection fraction, by estimation, is 55 to 60%. The left ventricle has normal function. The left ventricle has no regional wall motion abnormalities. There is mild left ventricular hypertrophy.  2. Right ventricular systolic function is mildly reduced. The right ventricular size is normal.  3. Left atrial size was moderately dilated. No left atrial/left atrial appendage thrombus was detected.  4. The mitral valve is normal in structure. Trivial mitral valve regurgitation. No evidence of mitral stenosis.  5. The aortic valve is tricuspid. There is severe calcifcation of the aortic valve. Aortic valve regurgitation is not visualized. Moderate to severe aortic valve stenosis by planar measurement, 0.9 to 1 cm sq. Non-coronary cusp is relatively non-mobile.  3D images obtained.  6. The inferior vena cava is normal in size with greater than 50% respiratory variability, suggesting right atrial pressure of 3 mmHg.  7. Right atrial size was mildly dilated.  8. Agitated saline contrast bubble study was negative, with no evidence of any interatrial shunt. Conclusion(s)/Recommendation(s): Normal biventricular function without evidence of hemodynamically significant valvular heart disease. FINDINGS  Left Ventricle: Left ventricular ejection fraction, by estimation, is 55 to 60%. The left ventricle has normal function. The left ventricle has no regional wall motion abnormalities. The left ventricular internal cavity size was normal in size. There is  mild left ventricular hypertrophy. Right Ventricle: The right ventricular size is normal. No increase in right ventricular wall thickness. Right ventricular systolic function is mildly reduced. Left Atrium: Left atrial size was moderately dilated. No left atrial/left atrial appendage thrombus was detected. Right Atrium: Right atrial size was mildly dilated.  Pericardium: There is no evidence of pericardial effusion. Mitral Valve: The mitral valve is normal in structure. Trivial mitral valve regurgitation. No evidence of mitral valve stenosis. Tricuspid Valve: The tricuspid valve is normal in structure. Tricuspid valve regurgitation is mild . No evidence of tricuspid stenosis. Aortic Valve: The aortic valve is tricuspid. There is severe calcifcation of the aortic valve. Aortic valve regurgitation is not visualized. Moderate to severe aortic stenosis is present. Pulmonic Valve: The pulmonic valve was normal in structure. Pulmonic valve regurgitation is not visualized. No evidence of pulmonic stenosis. Aorta: The aortic root is normal in size and structure. Venous: The inferior vena cava is normal in size with greater than 50% respiratory  variability, suggesting right atrial pressure of 3 mmHg. IAS/Shunts: No atrial level shunt detected by color flow Doppler. Agitated saline contrast was given intravenously to evaluate for intracardiac shunting. Agitated saline contrast bubble study was negative, with no evidence of any interatrial shunt. 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 for control rhythm was sinus rhythm 2 continue metoprolol Cardizem for rate control 3 hypertension reasonably controlled continue metoprolol and Cardizem with appropriate 4 agree with broad-spectrum antibiotic therapy for infection and sepsis 5  obesity recommend weight loss exercise portion control 6 diabetes continue current medical therapy 7 history of breast cancer cellulitis infection continue antibiotic therapy    Yolonda Kida, MD 02/15/2022 11:33 AM

## 2022-02-15 NOTE — Assessment & Plan Note (Addendum)
P/w tachypnea, fever and left breast cellulitis.   Abscess culture with rare staph epi, pan-sensitive  - Continue Cephalexin - Follow up with General Surgery, Dr. Christian Mate in 7 days - Follow up with Oncology

## 2022-02-16 LAB — AEROBIC/ANAEROBIC CULTURE W GRAM STAIN (SURGICAL/DEEP WOUND)

## 2022-02-16 LAB — GLUCOSE, CAPILLARY
Glucose-Capillary: 206 mg/dL — ABNORMAL HIGH (ref 70–99)
Glucose-Capillary: 180 mg/dL — ABNORMAL HIGH (ref 70–99)
Glucose-Capillary: 188 mg/dL — ABNORMAL HIGH (ref 70–99)
Glucose-Capillary: 200 mg/dL — ABNORMAL HIGH (ref 70–99)
Glucose-Capillary: 160 mg/dL — ABNORMAL HIGH (ref 70–99)

## 2022-02-16 MED ORDER — INSULIN GLARGINE-YFGN 100 UNIT/ML ~~LOC~~ SOLN
8.0000 [IU] | Freq: Every day | SUBCUTANEOUS | Status: DC
  Administered 2022-02-17 – 2022-02-18 (×2): 8 [IU] via SUBCUTANEOUS
  Filled 2022-02-16 (×2): qty 0.08

## 2022-02-16 MED ORDER — INSULIN ASPART 100 UNIT/ML IJ SOLN
3.0000 [IU] | Freq: Three times a day (TID) | INTRAMUSCULAR | Status: DC
  Administered 2022-02-16 – 2022-02-18 (×7): 3 [IU] via SUBCUTANEOUS
  Filled 2022-02-16 (×7): qty 1

## 2022-02-16 MED ORDER — CEPHALEXIN 500 MG PO CAPS
500.0000 mg | ORAL_CAPSULE | Freq: Four times a day (QID) | ORAL | Status: DC
  Administered 2022-02-16 – 2022-02-18 (×8): 500 mg via ORAL
  Filled 2022-02-16 (×8): qty 1

## 2022-02-16 MED ORDER — SODIUM CHLORIDE 0.9% FLUSH
3.0000 mL | Freq: Two times a day (BID) | INTRAVENOUS | Status: DC
Start: 2022-02-16 — End: 2022-02-18
  Administered 2022-02-16 – 2022-02-18 (×5): 3 mL via INTRAVENOUS

## 2022-02-16 NOTE — TOC Progression Note (Signed)
Transition of Care [facility]) - Progression Note    Patient Details  Name: Rhonda Thornton MRN: [id number] Date of Birth: Mar 09, 1951  Transition of Care [facility]) CM/SW [place], LCSW Phone Number: 02/16/2022, 9:05 AM  Clinical Narrative:   Following for bed offers.   Expected Discharge Plan: [place] Barriers to Discharge: Continued Medical Work up  Expected Discharge Plan and Services Expected Discharge Plan: [place] arrangements for the past 2 months: Single Family Home                                       Social Determinants of Health (SDOH) Interventions    Readmission Risk Interventions Readmission Risk Prevention Plan 02/15/2022  Transportation Screening Complete  PCP or Specialist Appt within 5-7 Days Complete  Home Care Screening Complete  Medication Review (RN CM) Complete  Some recent data might be hidden

## 2022-02-16 NOTE — Progress Notes (Signed)
[facility] Cardiology    SUBJECTIVE: Presents with sepsis cellulitis left breast diabetes hypertension sleep apnea obesity atrial fibrillation denies any palpitations or tachycardia now here for follow-up evaluation seems to be doing much better   Vitals:   02/16/22 0806 02/16/22 1117 02/16/22 1557 02/16/22 1938  BP: 139/62 129/67 (!) 149/94 (!) 120/58  Pulse: 71 64 66 71  Resp: 18 17 18 18   Temp: 98 F (36.7 C) 97.7 F (36.5 C) 98.1 F (36.7 C) 98.2 F (36.8 C)  TempSrc:      SpO2: 96% 96% 94% 94%  Weight:      Height:         Intake/Output Summary (Last 24 hours) at 02/16/2022 2355 Last data filed at 02/16/2022 2126 Gross per 24 hour  Intake 1358.8 ml  Output 2350 ml  Net -991.2 ml      PHYSICAL EXAM  General: Well developed, well nourished, in no acute distress HEENT:  Normocephalic and atramatic Neck:  No JVD.  Lungs: Clear bilaterally to auscultation and percussion. Heart: HRRR . Normal S1 and S2 without gallops or murmurs.  Abdomen: Bowel sounds are positive, abdomen soft and non-tender  Msk:  Back normal, normal gait. Normal strength and tone for age. Extremities: No clubbing, cyanosis or edema.   Neuro: Alert and oriented X 3. Psych:  Good affect, responds appropriately   LABS: Basic Metabolic Panel: Recent Labs    02/14/22 0557  NA 136  K 3.8  CL 99  CO2 29  GLUCOSE 207*  BUN 17  CREATININE 0.53  CALCIUM 8.6*  MG 1.9  PHOS 4.2   Liver Function Tests: No results for input(s): AST, ALT, ALKPHOS, BILITOT, PROT, ALBUMIN in the last 72 hours. No results for input(s): LIPASE, AMYLASE in the last 72 hours. CBC: Recent Labs    02/14/22 0557  WBC 7.2  HGB 9.4*  HCT 29.9*  MCV 87.4  PLT 263   Cardiac Enzymes: No results for input(s): CKTOTAL, CKMB, CKMBINDEX, TROPONINI in the last 72 hours. BNP: Invalid input(s): POCBNP D-Dimer: No results for input(s): DDIMER in the last 72 hours. Hemoglobin A1C: No results for input(s): HGBA1C in the last 72  hours. Fasting Lipid Panel: No results for input(s): CHOL, HDL, LDLCALC, TRIG, CHOLHDL, LDLDIRECT in the last 72 hours. Thyroid Function Tests: No results for input(s): TSH, T4TOTAL, T3FREE, THYROIDAB in the last 72 hours.  Invalid input(s): FREET3 Anemia Panel: No results for input(s): VITAMINB12, FOLATE, FERRITIN, TIBC, IRON, RETICCTPCT in the last 72 hours.  No results found.   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, left (HCC)   Type 2 diabetes mellitus without complications (HCC)   Essential (primary) hypertension   Sleep apnea   Morbid obesity (HCC)   Cellulitis of left breast   Atrial fibrillation with RVR (HCC)    Plan 1 agree with amiodarone therapy for rhythm currently in sinus Metoprolol Cardizem for rate control for atrial fibrillation Continue hypertension management metoprolol Cardizem Broad-spectrum antibiotic therapy for sepsis and infection Obesity recommend weight loss exercise portion control Diabetes medications should be instituted because of renal insufficiency History of breast cancer cellulitis Sleep study CPAP if indicated for possible obstructive sleep apnea  Alwyn Pea, MD 02/16/2022 11:55 PM

## 2022-02-16 NOTE — Progress Notes (Signed)
Progress Note   Patient: Rhonda Thornton AST:[id number] DOB: 1951/05/08 DOA: 02/08/2022     8 DOS: the patient was seen and examined on 02/16/2022        Brief hospital course: ELAISHA ZAHNISER is a 71 y.o. 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 Patient states that she has not felt well for the last 3 days and has been weak, fatigued, has had myalgias as well as fever and chills. She called EMS and when they arrived she was found to be in A-fib with a rapid ventricular rate with heart rates between 160 and 180. She complains of feeling very sore in her left breast and has had nausea.  2/19: Cardiology consulted for Afib with RVR 2/21: Gen Surg consulted for breast seroma, IR aspirated seroma, few cells, rare staph epi 2/24: Underwent TEE with DCCV for Afib, now back in sinus       Assessment and Plan: * Sepsis ([place])- (present on admission) P/w tachypnea, fever and left breast cellulitis.   Abscess culture with rare staph epi, pan-sensitive  - Transition to Cephalexin - Consult General Surgery, appreciate cares       Cellulitis of left breast- (present on admission) See above  Atrial fibrillation with RVR ([place])- (present on admission) Underwent DCCv and now in sinus.  Stable HR. 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  No fever, confusion, vomiting.  Pain, palpitations  Physical Exam: Vitals:   02/16/22 0016 02/16/22 0415 02/16/22 0806 02/16/22 1117  BP: (!) 100/42 (!) 97/50 139/62 129/67  Pulse: 67 68 71 64  Resp: 20 20 18 17   Temp: 98.1 F (36.7 C) 99.3 F (37.4 C) 98 F (36.7 C) 97.7 F (36.5 C)  TempSrc:      SpO2: 97% 96% 96% 96%  Weight:      Height:       Obese adult female, sitting up in bed, no acute distress. RRR, no murmurs, no peripheral edema Respiratory rate normal, lungs clear without rales or wheezes The left breast is hard and indurated, there is a large very firm and tender subcutaneous mass, she has peau d'orange and redness extending mostly around the left breast. Mental status normal, face symmetric, speech fluent, moves upper extremities with generalized weakness but symmetric strength      Data Reviewed: Labs notable for elevated glucose    Family Communication:      Disposition: Status is: Inpatient Remains inpatient appropriate because: The patient will require significant rehabilitation return to her prior level of function.  Ongoing search for SNF is pending.          Planned  Discharge Destination: Skilled nursing facility           Author: Edwin Dada, MD 02/16/2022 3:16 PM  For on call review [email or web address].

## 2022-02-17 ENCOUNTER — Encounter: Payer: Self-pay | Admitting: Cardiovascular Disease

## 2022-02-17 DIAGNOSIS — N61 Mastitis without abscess: Secondary | ICD-10-CM | POA: Diagnosis not present

## 2022-02-17 DIAGNOSIS — R238 Other skin changes: Secondary | ICD-10-CM | POA: Diagnosis not present

## 2022-02-17 DIAGNOSIS — N6489 Other specified disorders of breast: Secondary | ICD-10-CM | POA: Diagnosis not present

## 2022-02-17 LAB — GLUCOSE, CAPILLARY
Glucose-Capillary: 170 mg/dL — ABNORMAL HIGH (ref 70–99)
Glucose-Capillary: 210 mg/dL — ABNORMAL HIGH (ref 70–99)
Glucose-Capillary: 165 mg/dL — ABNORMAL HIGH (ref 70–99)
Glucose-Capillary: 164 mg/dL — ABNORMAL HIGH (ref 70–99)

## 2022-02-17 NOTE — Plan of Care (Signed)
Pt with complaints of indigestion earlier in shift.  Frequent burping with complaints of fullness.   Assisted up to [facility] for bowel evacuation.  Moderate soft pasty BM passed with reports of feeling better.

## 2022-02-17 NOTE — Progress Notes (Signed)
Physical Therapy Treatment Patient Details Name: Rhonda Thornton MRN: [id number] DOB: [birth year] Today's Date: 02/17/2022   History of Present Illness 71 y.o. female who presents to the ED w/ EMS after a fall. Pt states she has felt lightheaded and weak over the past days; found to be in A-Fib w/ RVR w/ EMS. US guided aspiration yielded 132mL dark red/brown fluid. PmHx: History of Left-sided breast cancer status post lumpectomy and radiation, HTN, HLD, obesity, T2DM, history of seizure disorder.    PT Comments    Pt is making good progress towards goals with ability to ambulate slightly further distance in room this date with close chair follow. Quick fatigue with 1 seated rest break. All mobility performed on 2L of O2 with sats WNL. Good endurance with there-ex. Pt appears motivated to participate. Will continue to progress as able.   Recommendations for follow up therapy are one component of a multi-disciplinary discharge planning process, led by the attending physician.  Recommendations may be updated based on patient status, additional functional criteria and insurance authorization.  Follow Up Recommendations  Skilled nursing-short term rehab (<3 hours/day)     Assistance Recommended at Discharge Intermittent Supervision/Assistance  Patient can return home with the following A lot of help with walking and/or transfers;A lot of help with bathing/dressing/bathroom;Assist for transportation;Assistance with cooking/housework;Help with stairs or ramp for entrance   Equipment Recommendations   (TBD)    Recommendations for Other Services       Precautions / Restrictions Precautions Precautions: Fall Restrictions Weight Bearing Restrictions: No     Mobility  Bed Mobility               General bed mobility comments: received in recliner    Transfers Overall transfer level: Needs assistance Equipment used: Rolling walker (2 wheels) Transfers: Sit to/from Stand Sit to Stand:  Min assist           General transfer comment: cues for hand placement. Once standing, forward flexed posture noted and cues for keeping RW close to body    Ambulation/Gait Ambulation/Gait assistance: Mod assist, Min assist Gait Distance (Feet): 16 Feet Assistive device: Rolling walker (2 wheels) Gait Pattern/deviations: Step-to pattern       General Gait Details: ambulated with min/mod assist and chair follow. very short step to gait pattern with shuffling gait. Forward flexed posture with inconsistently able to correct. Fatigues quickly with 1 seated rest break 1/2 way. O2 on 2L for all mobility with sats at 93% and HR at 83bpm with exertion   Stairs             Wheelchair Mobility    Modified Rankin (Stroke Patients Only)       Balance Overall balance assessment: Needs assistance Sitting-balance support: No upper extremity supported, Feet unsupported Sitting balance-Leahy Scale: Good     Standing balance support: Bilateral upper extremity supported, During functional activity Standing balance-Leahy Scale: Poor                              Cognition Arousal/Alertness: Awake/alert Behavior During Therapy: WFL for tasks assessed/performed Overall Cognitive Status: Within Functional Limits for tasks assessed                                 General Comments: very pleasant and agreeable to therapy        Exercises Other Exercises Other Exercises: seated  ther-ex performed on B LE including LAQ, alt marching, hip add squeezes, hip abd/add, and AP. 12 reps performed with safe technique    General Comments        Pertinent Vitals/Pain Pain Assessment Pain Assessment: No/denies pain    Home Living                          Prior Function            PT Goals (current goals can now be found in the care plan section) Acute Rehab PT Goals Patient Stated Goal: "walk again" PT Goal Formulation: With patient Time For  Goal Achievement: 03/01/22 Potential to Achieve Goals: Good Progress towards PT goals: Progressing toward goals    Frequency    Min 2X/week      PT Plan Current plan remains appropriate    Co-evaluation              AM-PAC PT "6 Clicks" Mobility   Outcome Measure  Help needed turning from your back to your side while in a flat bed without using bedrails?: A Little Help needed moving from lying on your back to sitting on the side of a flat bed without using bedrails?: A Little Help needed moving to and from a bed to a chair (including a wheelchair)?: A Little Help needed standing up from a chair using your arms (e.g., wheelchair or bedside chair)?: A Lot Help needed to walk in hospital room?: A Lot Help needed climbing 3-5 steps with a railing? : A Lot 6 Click Score: 15    End of Session Equipment Utilized During Treatment: Gait belt;Oxygen Activity Tolerance: Patient tolerated treatment well;Patient limited by fatigue Patient left: in chair;with call bell/phone within reach;with chair alarm set Nurse Communication: Mobility status PT Visit Diagnosis: Unsteadiness on feet (R26.81);Muscle weakness (generalized) (M62.81);History of falling (Z91.81);Difficulty in walking, not elsewhere classified (R26.2)     Time: 7846-9629 PT Time Calculation (min) (ACUTE ONLY): 24 min  Charges:  $Gait Training: 8-22 mins $Therapeutic Exercise: 8-22 mins                     Greggory Stallion, PT, DPT, GCS [phone]    Rhonda Thornton 02/17/2022, 3:36 PM

## 2022-02-17 NOTE — Progress Notes (Signed)
[facility] SURGICAL PROGRESS NOTE (cpt [phone])  Hospital Day(s): 9.   Post op day(s): 3 Days Post-Op.   Interval History: Patient seen and examined, no acute events or new complaints overnight. Patient reports same stable discomfort of her left breast and chest wall/axilla, denies any new drainage, discoloration.  Reports she is currently waiting for rehab placement.  She is tolerating a diet, and denies fevers and chills.   Vital signs in last 24 hours: [min-max] current  Temp:  [98.1 F (36.7 C)-99 F (37.2 C)] 98.9 F (37.2 C) (02/27 1249) Pulse Rate:  [63-71] 67 (02/27 1249) Resp:  [16-20] 18 (02/27 1249) BP: (91-149)/(50-94) 91/76 (02/27 1249) SpO2:  [94 %-96 %] 96 % (02/27 1249)     Height: 5\' 4"  (162.6 cm) Weight: 108.9 kg BMI (Calculated): 41.18   Intake/Output last 2 shifts:  02/26 0701 - 02/27 0700 In: 1258.8 [P.O.:1080; I.V.:78.8; IV Piggyback:100] Out: 1700 [Urine:1700]   Physical Exam:  Constitutional: alert, cooperative and no distress  HENT: normocephalic without obvious abnormality  Eyes: PERRL, EOM's grossly intact and symmetric  Neuro: CN II - XII grossly intact and symmetric without deficit  Left upper outer breast area remains pink with blanching, palpable seroma on exam.  No exquisite tenderness.  The dressing was removed to identify the bleb has since drained over the weekend leaving an old blood film overlying the skin.  But no evidence of persistent drainage, erythema.   Labs:  CBC Latest Ref Rng & Units 02/14/2022 02/13/2022 02/12/2022  WBC 4.0 - 10.5 K/uL 7.2 7.9 8.2  Hemoglobin 12.0 - 15.0 g/dL 9.4(L) 10.0(L) 9.9(L)  Hematocrit 36.0 - 46.0 % 29.9(L) 31.8(L) 31.2(L)  Platelets 150 - 400 K/uL 263 257 247   CMP Latest Ref Rng & Units 02/14/2022 02/13/2022 02/12/2022  Glucose 70 - 99 mg/dL 207(H) 214(H) 205(H)  BUN 8 - 23 mg/dL 17 16 13   Creatinine 0.44 - 1.00 mg/dL 0.53 0.55 0.54  Sodium 135 - 145 mmol/L 136 136 137  Potassium 3.5 - 5.1  mmol/L 3.8 4.2 4.0  Chloride 98 - 111 mmol/L 99 97(L) 98  CO2 22 - 32 mmol/L 29 32 28  Calcium 8.9 - 10.3 mg/dL 8.6(L) 8.7(L) 8.7(L)  Total Protein 6.5 - 8.1 g/dL - - -  Total Bilirubin 0.3 - 1.2 mg/dL - - -  Alkaline Phos 38 - 126 U/L - - -  AST 15 - 41 U/L - - -  ALT 0 - 44 U/L - - -     Imaging studies: No new pertinent imaging studies   Assessment/Plan:  71 y.o. female with chronic left breast seroma, with radiative changes.  No evidence of persisting abscess, suspect blanching erythema consistent with postradiation changes.  We are attempting to avoid frequent reaspirations, which would likely increase the risk of infecting the underlying seroma. 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  -I can follow her up in my office as an outpatient.  -She appears to be tolerating Keflex well without ill effect.  -Not anticipating any further surgical or interventional radiology intervention during remainder of this hospitalization.  All of the above findings and recommendations were discussed with the patient, and all of patient's were answered to her expressed satisfaction.   -- Ronny Bacon M.D., [facility] 02/17/2022 3:27 PM

## 2022-02-17 NOTE — Progress Notes (Signed)
[facility]    SUBJECTIVE: Patient states he doing reasonably well no significant chest pain ready for transfer to rehab.  Denies any back as per the syncope .  No chest pain no palpitation   Vitals:   02/16/22 1938 02/17/22 0051 02/17/22 0402 02/17/22 0822  BP: (!) 120/58 (!) 96/52 (!) 108/50 (!) 109/55  Pulse: 71 64 67 63  Resp: 18 20 18 16   Temp: 98.2 F (36.8 C) 99 F (37.2 C) 98.9 F (37.2 C) 98.8 F (37.1 C)  TempSrc:      SpO2: 94% 95% 95% 95%  Weight:      Height:         Intake/Output Summary (Last 24 hours) at 02/17/2022 1200 Last data filed at 02/17/2022 1107 Gross per 24 hour  Intake 798.8 ml  Output 1400 ml  Net -601.2 ml      PHYSICAL EXAM  General: Well developed, well nourished, in no acute distress HEENT:  Normocephalic and atramatic Neck:  No JVD.  Lungs: Clear bilaterally to auscultation and percussion. Heart: HRRR . Normal S1 and S2 without gallops or 3/6 sem murmurs.  Abdomen: Bowel sounds are positive, abdomen soft and non-tender  Msk:  Back normal, normal gait. Normal strength and tone for age. Extremities: No clubbing, cyanosis or edema.   Neuro: Alert and oriented X 3. Psych:  Good affect, responds appropriately   LABS: Basic Metabolic Panel: No results for input(s): NA, K, CL, CO2, GLUCOSE, BUN, CREATININE, CALCIUM, MG, PHOS in the last 72 hours. Liver Function Tests: No results for input(s): AST, ALT, ALKPHOS, BILITOT, PROT, ALBUMIN in the last 72 hours. No results for input(s): LIPASE, AMYLASE in the last 72 hours. CBC: No results for input(s): WBC, NEUTROABS, HGB, HCT, MCV, PLT in the last 72 hours. Cardiac Enzymes: No results for input(s): CKTOTAL, CKMB, CKMBINDEX, TROPONINI in the last 72 hours. BNP: Invalid input(s): POCBNP D-Dimer: No results for input(s): DDIMER in the last 72 hours. Hemoglobin A1C: No results for input(s): HGBA1C in the last 72 hours. Fasting Lipid Panel: No results for input(s): CHOL, HDL, LDLCALC,  TRIG, CHOLHDL, LDLDIRECT in the last 72 hours. Thyroid Function Tests: No results for input(s): TSH, T4TOTAL, T3FREE, THYROIDAB in the last 72 hours.  Invalid input(s): FREET3 Anemia Panel: No results for input(s): VITAMINB12, FOLATE, FERRITIN, TIBC, IRON, RETICCTPCT in the last 72 hours.  No results found.   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, left ([place])   Type 2 diabetes mellitus without complications ([place])   Essential (primary) hypertension   Sleep apnea   Morbid obesity ([place])   Cellulitis of left breast   Atrial fibrillation with RVR ([place])    Plan Atrial fibrillationFlutter status post cardioversion now in sinus rhythm continue current therapy continue amiodarone and Eliquis Sepsis related to yeast continue current therapy agree with ID input continue Keflex Diabetes type 2 agree with medical therapy diabetes management insulin Hypertension currently stable continue current therapy metoprolol diltiazem Hyperlipidemia patient currently Lipitor therapy for lipid management GERD continue Protonix therapy for reflux type symptoms   Yolonda Kida, MD 02/17/2022 12:00 PM

## 2022-02-17 NOTE — Progress Notes (Signed)
Progress Note   Patient: Rhonda Thornton RWE:[id number] DOB: 09-18-1951 DOA: 02/08/2022     9 DOS: the patient was seen and examined on 02/17/2022        Brief hospital course: Rhonda Thornton is a 71 y.o. 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 Patient states that she has not felt well for the last 3 days and has been weak, fatigued, has had myalgias as well as fever and chills. She called EMS and when they arrived she was found to be in A-fib with a rapid ventricular rate with heart rates between 160 and 180. She complains of feeling very sore in her left breast and has had nausea.  2/19: Cardiology consulted for Afib with RVR 2/21: Gen Surg consulted for breast seroma, IR aspirated seroma, few cells, rare staph epi 2/24: Underwent TEE with DCCV for Afib, now back in sinus       Assessment and Plan: * Sepsis ([place])- (present on admission) P/w tachypnea, fever and left breast cellulitis.   Abscess culture with rare staph epi, pan-sensitive  - Continue Cephalexin - Follow up with General Surgery, Dr. Christian Mate in 7 days - Follow up with Oncology       Cellulitis of left breast- (present on admission) See above  Atrial fibrillation with RVR ([place])- (present on admission) Underwent DCCv and now in sinus.  Stable HR. 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  Having some heartburn.  Breast still red and swollen.  Physical Exam: Vitals:   02/17/22 0051 02/17/22 0402 02/17/22 0822 02/17/22 1249  BP: (!) 96/52 (!) 108/50 (!) 109/55 91/76  Pulse: 64 67 63 67  Resp: 20 18 16 18   Temp: 99 F (37.2 C) 98.9 F (37.2 C) 98.8 F (37.1 C) 98.9 F (37.2 C)  TempSrc:      SpO2: 95% 95% 95% 96%  Weight:      Height:       Obese adult female, sitting up in bed, no acute distress. RRR, no murmurs, no peripheral edema Respiratory rate normal, lungs clear without rales or wheezes The left breast is hard and indurated, there is a large very firm and tender subcutaneous mass, she has peau d'orange and redness extending mostly around the left breast, this is slightly worse than yesterday when it was slightly better than before. Mental status normal, face symmetric, speech fluent, moves upper extremities with generalized weakness but symmetric strength      Data Reviewed: No new lab    Family Communication:      Disposition: Status is: Inpatient Remains inpatient appropriate because: The patient will require significant rehabilitation return to her prior level of function.  Ongoing search for  SNF is pending.          Planned Discharge Destination: Skilled nursing facility           Author: Edwin Dada, MD 02/17/2022 3:32 PM  For on call review [email or web address].

## 2022-02-17 NOTE — TOC Transition Note (Signed)
Transition of Care [facility]) - CM/SW Discharge Note   Patient Details  Name: Rhonda Thornton MRN: [id number] Date of Birth: July 23, 1951  Transition [facility]) CM/SW Contact:  Kerin Salen, RN Phone Number: 02/17/2022, 3:10 PM   Clinical Narrative: Spoke with patient about [place] bed offer, she approves. Magda Paganini from [employer] will start Authorization.        Barriers to Discharge: Continued Medical Work up   Patient Goals and CMS Choice Patient states their goals for this hospitalization and ongoing recovery are:: SNF CMS Medicare.gov Compare Post Acute Care list provided to:: Patient Choice offered to / list presented to : Patient  Discharge Placement                       Discharge Plan and Services                                     Social Determinants of Health (SDOH) Interventions     Readmission Risk Interventions Readmission Risk Prevention Plan 02/15/2022  Transportation Screening Complete  PCP or Specialist Appt within 5-7 Days Complete  Home Care Screening Complete  Medication Review (RN CM) Complete  Some recent data might be hidden

## 2022-02-17 NOTE — Anesthesia Postprocedure Evaluation (Signed)
Anesthesia Post Note  Patient: Rhonda Thornton  Procedure(s) Performed: CARDIOVERSION TRANSESOPHAGEAL ECHOCARDIOGRAM (TEE)  Patient location during evaluation: Specials Recovery Anesthesia Type: General Level of consciousness: awake and alert Pain management: pain level controlled Vital Signs Assessment: post-procedure vital signs reviewed and stable Respiratory status: spontaneous breathing, nonlabored ventilation, respiratory function stable and patient connected to nasal cannula oxygen Cardiovascular status: blood pressure returned to baseline and stable Postop Assessment: no apparent nausea or vomiting Anesthetic complications: no   No notable events documented.   Last Vitals:  Vitals:   02/17/22 0402 02/17/22 0822  BP: (!) 108/50 (!) 109/55  Pulse: 67 63  Resp: 18 16  Temp: 37.2 C 37.1 C  SpO2: 95% 95%    Last Pain:  Vitals:   02/17/22 0822  TempSrc:   PainSc: 0-No pain                 Martha Clan

## 2022-02-18 DIAGNOSIS — E1165 Type 2 diabetes mellitus with hyperglycemia: Secondary | ICD-10-CM

## 2022-02-18 LAB — CBC
HCT: 29.2 % — ABNORMAL LOW (ref 36.0–46.0)
Hemoglobin: 8.9 g/dL — ABNORMAL LOW (ref 12.0–15.0)
MCH: 27.1 pg (ref 26.0–34.0)
MCHC: 30.5 g/dL (ref 30.0–36.0)
MCV: 88.8 fL (ref 80.0–100.0)
Platelets: 279 10*3/uL (ref 150–400)
RBC: 3.29 MIL/uL — ABNORMAL LOW (ref 3.87–5.11)
RDW: 15.4 % (ref 11.5–15.5)
WBC: 9.3 10*3/uL (ref 4.0–10.5)
nRBC: 0 % (ref 0.0–0.2)

## 2022-02-18 LAB — RESP PANEL BY RT-PCR (FLU A&B, COVID) ARPGX2
Influenza A by PCR: NEGATIVE
Influenza B by PCR: NEGATIVE
SARS Coronavirus 2 by RT PCR: NEGATIVE

## 2022-02-18 LAB — GLUCOSE, CAPILLARY
Glucose-Capillary: 199 mg/dL — ABNORMAL HIGH (ref 70–99)
Glucose-Capillary: 207 mg/dL — ABNORMAL HIGH (ref 70–99)

## 2022-02-18 MED ORDER — POLYETHYLENE GLYCOL 3350 17 G PO PACK: 17.0000 g | PACK | Freq: Every day | ORAL | 0 refills | Status: DC

## 2022-02-18 MED ORDER — APIXABAN 5 MG PO TABS: 5.0000 mg | ORAL_TABLET | Freq: Two times a day (BID) | ORAL | 3 refills | Status: DC

## 2022-02-18 MED ORDER — HYDROCODONE-ACETAMINOPHEN 7.5-325 MG PO TABS: 1.0000 | ORAL_TABLET | Freq: Two times a day (BID) | ORAL | 0 refills | Status: DC

## 2022-02-18 MED ORDER — PREGABALIN 100 MG PO CAPS: 100.0000 mg | ORAL_CAPSULE | Freq: Three times a day (TID) | ORAL | 0 refills | Status: AC

## 2022-02-18 MED ORDER — SENNOSIDES-DOCUSATE SODIUM 8.6-50 MG PO TABS: 2.0000 | ORAL_TABLET | Freq: Two times a day (BID) | ORAL | Status: DC

## 2022-02-18 MED ORDER — AMIODARONE HCL 200 MG PO TABS: 200.0000 mg | ORAL_TABLET | Freq: Every day | ORAL | Status: AC

## 2022-02-18 MED ORDER — DILTIAZEM HCL ER COATED BEADS 240 MG PO CP24: 240.0000 mg | ORAL_CAPSULE | Freq: Every day | ORAL | Status: AC

## 2022-02-18 MED ORDER — METOPROLOL TARTRATE 50 MG PO TABS: 50.0000 mg | ORAL_TABLET | Freq: Two times a day (BID) | ORAL | Status: DC

## 2022-02-18 MED ORDER — CEPHALEXIN 500 MG PO CAPS: 500.0000 mg | ORAL_CAPSULE | Freq: Four times a day (QID) | ORAL | 0 refills | Status: AC

## 2022-02-18 NOTE — TOC Transition Note (Addendum)
Transition of Care [facility]) - CM/SW Discharge Note   Patient Details  Name: Rhonda Thornton MRN: [id number] Date of Birth: 1951-06-23  [facility] CM/SW Contact:  Alberteen Sam, LCSW Phone Number: 02/18/2022, 10:17 AM   Clinical Narrative:     Patient will DC to: [facility] Anticipated DC date: 02/18/22 Family notified: brother Engineer, maintenance byJohnanna Schneiders  Per MD patient ready for DC to [facility] . RN, patient, patient's family, and facility notified of DC. Discharge Summary sent to facility. RN given number for report   [phone] Room 401. DC packet on chart. Ambulance transport requested for patient.  CSW signing off.  Pricilla Riffle, LCSW   Final next level of care: Skilled Nursing Facility Barriers to Discharge: No Barriers Identified   Patient Goals and CMS Choice Patient states their goals for this hospitalization and ongoing recovery are:: to go home CMS Medicare.gov Compare Post Acute Care list provided to:: Patient Choice offered to / list presented to : Patient  Discharge Placement              Patient [facility] bed at: [facility] Patient to be transferred to facility by: [facility] Name of family member notified: brother Fritz Pickerel Patient and family notified of of transfer: 02/18/22  Discharge Plan and Services                                     Social Determinants of Health ([place]) Interventions     Readmission Risk Interventions Readmission Risk Prevention Plan 02/15/2022  Transportation Screening Complete  PCP or Specialist Appt within 5-7 Days Complete  Home Care Screening Complete  Medication Review (RN CM) Complete  Some recent data might be hidden

## 2022-02-18 NOTE — Care Management Important Message (Signed)
Important Message  Patient Details  Name: Rhonda Thornton MRN: [id number] Date of Birth: May 13, 1951   Medicare Important Message Given:  Yes     Dannette Barbara 02/18/2022, 11:18 AM

## 2022-02-18 NOTE — Progress Notes (Signed)
Report called to RN at [facility]. EMS provided transportation. IV removed, tele box returned, all of patient's belongings sent with EMS

## 2022-02-18 NOTE — Discharge Summary (Signed)
Physician Discharge Summary   Patient: Rhonda Thornton MRN: [id number] DOB: 11/12/1951  Admit date:     02/08/2022  Discharge date: 02/18/22  Discharge Physician: Edwin Dada   PCP: Denton Lank, MD   Recommendations at discharge:  [place]: Please schedule follow up appointment with Dr. Christian Mate within 10 days Please arrange follow up with Oncology, Dr. Janese Banks in 2-4 weeks Please arrange follow up with Cardiology, Dr. 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 Patient states that she has not felt well for the last 3 days and has been weak, fatigued, has had myalgias as well as fever and chills. She called EMS and when they arrived she was found to be in A-fib with a rapid ventricular rate with heart rates between 160 and 180. She complains of feeling very sore in her left breast and has had nausea.  2/19: Cardiology consulted for Afib with RVR 2/21: Gen Surg consulted for breast seroma, IR aspirated seroma, few cells, rare staph epi 2/24: Underwent TEE with DCCV for Afib, now back in sinus      Assessment and Plan: * Sepsis due to cellulitis of the left breast due to staph epidermidis P/w tachypnea, fever and left breast cellulitis.  Suspected source was  superinfection of left breast seroma.  This was aspirated with culture growing rare staph epi, pan-sensitive  General surgery was consulted, she was treated with cefazolin, transition to Keflex, and had slight improvement.  She should follow-up with general surgery within 10 days.     Atrial fibrillation with RVR ([place])- (present on admission) Patient developed atrial fibrillation with RVR.  This was persistent despite amiodarone, so she underwent TEE with DCCV and had return of sinus rhythm.  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    Consultants: General Surgery, Cardiology, Oncology Procedures performed: TEE with DCCV; aspiration of breast seroma Disposition: Skilled nursing facility Diet recommendation:  Discharge Diet Orders (From admission, onward)     Start     Ordered   02/18/22 0000  Diet - low sodium heart healthy        02/18/22 0934             DISCHARGE MEDICATION: Allergies as of 02/18/2022   No Known Allergies      Medication List     STOP taking these medications    amLODipine 5 MG tablet Commonly  known as: NORVASC   aspirin 325 MG EC tablet   ibuprofen 200 MG tablet Commonly known as: ADVIL   lisinopril 10 MG tablet Commonly known as: ZESTRIL   phenylephrine 10 MG Tabs tablet Commonly known as: SUDAFED PE       TAKE these medications    alum & mag hydroxide-simeth 200-200-20 MG/5ML suspension Commonly known as: MAALOX/MYLANTA Take 30 mLs by mouth every 6 (six) hours as needed for indigestion or heartburn.   amiodarone 200 MG  tablet Commonly known as: PACERONE Take 1 tablet (200 mg total) by mouth daily.   apixaban 5 MG Tabs tablet Commonly known as: ELIQUIS Take 1 tablet (5 mg total) by mouth 2 (two) times daily.   ARTIFICIAL TEARS OP Place 1 drop into both eyes daily.   atorvastatin 80 MG tablet Commonly known as: LIPITOR Take 80 mg by mouth daily.   calcium carbonate 500 MG chewable tablet Commonly known as: TUMS - dosed in mg elemental calcium Chew 1,000 mg by mouth 3 (three) times daily as needed for indigestion or heartburn.   cephALEXin 500 MG capsule Commonly known as: KEFLEX Take 1 capsule (500 mg total) by mouth every 6 (six) hours for 7 days.   cetirizine 10 MG tablet Commonly known as: ZYRTEC Take 10 mg by mouth daily.   diltiazem 240 MG 24 hr capsule Commonly known as: CARDIZEM CD Take 1 capsule (240 mg total) by mouth daily.   dorzolamide-timolol 22.3-6.8 MG/ML ophthalmic solution Commonly known as: COSOPT Place 1 drop into both eyes 2 (two) times daily.   fluticasone 50 MCG/ACT nasal spray Commonly known as: FLONASE Place 2 sprays into both nostrils daily.   glipiZIDE 5 MG 24 hr tablet Commonly known as: GLUCOTROL XL Take 5 mg by mouth daily.   HYDROcodone-acetaminophen 7.5-325 MG tablet Commonly known as: NORCO Take 1 tablet by mouth 2 (two) times daily.   latanoprost 0.005 % ophthalmic solution Commonly known as: XALATAN Place 1 drop into both eyes at bedtime.   letrozole 2.5 MG tablet Commonly known as: FEMARA Take 1 tablet (2.5 mg total) by mouth daily.   metFORMIN 500 MG tablet Commonly known as: GLUCOPHAGE Take 1,000 mg by mouth 2 (two) times daily.   metoprolol tartrate 50 MG tablet Commonly known as: LOPRESSOR Take 1 tablet (50 mg total) by mouth 2 (two) times daily.   multivitamin capsule Take 1 capsule by mouth daily.   Daily-Vite Multivitamin Tabs Take 1 tablet by mouth daily.   naproxen 500 MG tablet Commonly known as: NAPROSYN Take 500 mg  by mouth 2 (two) times daily.   OXYGEN Inhale 2 L into the lungs at bedtime.   pantoprazole 40 MG tablet Commonly known as: Protonix Take 1 tablet (40 mg total) by mouth daily.   polyethylene glycol 17 g packet Commonly known as: MIRALAX / GLYCOLAX Take 17 g by mouth daily.   pregabalin 100 MG capsule Commonly known as: LYRICA Take 1 capsule (100 mg total) by mouth 3 (three) times daily.   senna-docusate 8.6-50 MG tablet Commonly known as: Senokot-S Take 2 tablets by mouth 2 (two) times daily.   sertraline 100 MG tablet Commonly known as: ZOLOFT Take 100 mg by mouth daily.   silver sulfADIAZINE 1 % cream Commonly known as: Silvadene Apply 1 application topically daily.   sodium chloride 0.65 % Soln nasal spray Commonly known as: OCEAN Place 1 spray into both nostrils as needed for congestion.   tolterodine 4 MG 24 hr capsule Commonly known  as: DETROL LA Take 4 mg by mouth daily.               Discharge Care Instructions  (From admission, onward)           Start     Ordered   02/18/22 0000  Discharge wound care:       Comments: Cover left breast blister with simple bandage   02/18/22 1610            Contact information for follow-up providers     Ronny Bacon, MD. Schedule an appointment as soon as possible for a visit in 1 week(s).   Specialty: General Surgery Contact information: [address] [place] [address] [phone]         Corey Skains, MD. Schedule an appointment as soon as possible for a visit in 1 month(s).   Specialty: Cardiology Contact information: [address] [facility] [place] [address] [phone]              Contact information for after-discharge care     [facility]erred [facility] .   Service: Skilled Nursing Contact information: [place] [phone]                     Discharge Exam: Danley Danker Weights   02/08/22 0536 02/08/22 1441 02/13/22 1502  Weight: 108.4 kg 108.4 kg 108.9 kg   General: Pt is alert, awake, not in acute distress Cardiovascular: RRR, nl S1-S2, no murmurs appreciated.   No LE edema.   Respiratory: Normal respiratory rate and rhythm.  CTAB without rales or wheezes.  Nasal cannula in place. Skin: The left breast has some dull mild redness over the her firm, chronic seroma.  The peau d'orange is gone, and the surrounding inflammation and redness and tenderness seems better. Abdominal: Abdomen soft and non-tender.  No distension or HSM.   Neuro/Psych: Strength symmetric in upper and lower extremities.  Judgment and insight appear normal.   Condition at discharge: good  The results of significant diagnostics from this hospitalization (including imaging, microbiology, ancillary and laboratory) are listed below for reference.   Imaging Studies: DG Knee 2 Views Right  Result Date: 02/08/2022 CLINICAL DATA:  71 year old female status post fall with weakness and dizziness. EXAM: RIGHT KNEE - 1-2 VIEW COMPARISON:  None. FINDINGS: Bone mineralization is within normal limits. Tricompartmental joint space loss and degenerative spurring, moderate to severe in both the medial and patellofemoral compartments. On cross-table lateral view no joint effusion identified. Patella intact. No acute osseous abnormality identified. No discrete soft tissue injury. IMPRESSION: No acute fracture or dislocation identified about the right knee. Advanced tricompartmental degeneration. Electronically Signed   By: Genevie Ann M.D.   On: 02/08/2022 06:44   CT HEAD WO CONTRAST (5MM)  Result Date: 02/08/2022 CLINICAL DATA:  71 year old female status post fall with weakness and dizziness. EXAM: CT HEAD WITHOUT CONTRAST TECHNIQUE: Contiguous axial images were obtained from the base of the skull  through the vertex without intravenous contrast. RADIATION DOSE REDUCTION: This exam was performed according to the departmental dose-optimization program which includes automated exposure control, adjustment of the mA and/or kV according to patient size and/or use of iterative reconstruction technique. COMPARISON:  Report of head CT 11/24/1997 (no images available). FINDINGS: Brain: Cerebral volume is within normal limits for age. Bulky dural calcifications. No  midline shift, ventriculomegaly, mass effect, evidence of mass lesion, intracranial hemorrhage or evidence of cortically based acute infarction. Gray-white matter differentiation is within normal limits throughout the brain. Vascular: Calcified atherosclerosis at the skull base. No suspicious intracranial vascular hyperdensity. Skull: No acute osseous abnormality identified. Hyperostosis frontalis. Sinuses/Orbits: Evidence of prior ethmoidectomy with asymmetric bilateral ethmoid and frontoethmoidal mucoperiosteal thickening, probable anterior left ethmoid mucocele on series 3, image 19, 17 mm long axis. Mild nasal septal nodularity. Frontal sinuses, sphenoid and maxillary sinuses remain well aerated. Tympanic cavities are clear. Left mastoids are clear. There is trace right mastoid fluid. Other: Negative visible nasopharynx. No orbit or scalp soft tissue injury identified. IMPRESSION: 1. No acute traumatic injury identified. 2. Normal for age non contrast CT appearance of the brain. 3. Chronic appearing ethmoid sinus disease with possible prior ethmoid sinus surgery. 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 4. Trace right mastoid fluid, likely postinflammatory and significance doubtful. Electronically Signed   By: Genevie Ann M.D.   On: 02/08/2022 07:02   CT Chest W Contrast  Result Date: 02/08/2022 CLINICAL DATA:  71 year old female status post fall with weakness and dizziness. Sepsis. ?Breast cancer status post radiation?Marland Kitchen EXAM: CT CHEST WITH CONTRAST  TECHNIQUE: Multidetector CT imaging of the chest was performed during intravenous contrast administration. RADIATION DOSE REDUCTION: This exam was performed according to the departmental dose-optimization program which includes automated exposure control, adjustment of the mA and/or kV according to patient size and/or use of iterative reconstruction technique. CONTRAST:  61mL OMNIPAQUE IOHEXOL 300 MG/ML  SOLN COMPARISON:  Chest CT 08/23/2017. Cervical spine CT today. FINDINGS: Cardiovascular: Calcified aortic atherosclerosis. Mild central pulmonary artery enlargement is chronic. Cardiac size at the upper limits of normal. No pericardial effusion. Mediastinum/Nodes: Small mediastinal lymph nodes have not significantly changed since 2018 and are likely postinflammatory. Lungs/Pleura: Major airways are patent. New subpleural and patchy peribronchial opacity in the anterior left upper lobe and lingula is likely post radiation related in this setting (series 3, image 61, see left chest wall findings below). Elsewhere subpleural scarring is chronic but progressed since 2018, especially in the lateral segment of the right middle lobe with associated bronchiectasis there. No pleural effusion. No other confluent pulmonary opacity. No suspicious pulmonary nodule. Small 4 mm left costophrenic angle nodule is stable since 2018 and benign. Upper Abdomen: Chronically absent gallbladder. Negative visible liver, spleen, adrenal glands, kidneys, and bowel in the upper abdomen. Fatty atrophied pancreas. Musculoskeletal: Osteopenia. No acute rib fracture identified. Sternum intact. Advanced disc degeneration in the lower thoracic and visible upper lumbar spine. Chronic flowing thoracic endplate osteophytes and interbody ankylosis. Other findings: Partially visible swollen left breast with extensive inflammatory stranding and large roughly 11 cm diameter encapsulated fluid collection (series 2, image 43) with simple fluid density. Left  axillary surgical clips. No axillary lymphadenopathy. IMPRESSION: 1. Partially visible swollen left breast with large, roughly 11 cm diameter encapsulated fluid collection. This may be a postoperative seroma. And extensive surrounding chest wall inflammation, with similar inflammatory changes in the underlying anterior left lung, is likely sequelae of XRT. Left axillary surgical clips. 2. No superimposed acute traumatic injury identified in the chest. 3. No definite thoracic metastatic disease; small mediastinal lymph nodes have not significantly changed since 2018 and are likely postinflammatory. 4. Aortic Atherosclerosis (ICD10-I70.0). Electronically Signed   By: Genevie Ann M.D.   On: 02/08/2022 07:14   CT Cervical Spine Wo Contrast  Result Date: 02/08/2022 CLINICAL DATA:  71 year old female status post fall with weakness and dizziness. EXAM:  CT CERVICAL SPINE WITHOUT CONTRAST TECHNIQUE: Multidetector CT imaging of the cervical spine was performed without intravenous contrast. Multiplanar CT image reconstructions were also generated. RADIATION DOSE REDUCTION: This exam was performed according to the departmental dose-optimization program which includes automated exposure control, adjustment of the mA and/or kV according to patient size and/or use of iterative reconstruction technique. COMPARISON:  Head and chest CT today reported separately. FINDINGS: Alignment: Mild straightening of cervical lordosis. Cervicothoracic junction alignment is within normal limits. Bilateral posterior element alignment is within normal limits. Skull base and vertebrae: Visualized skull base is intact. No atlanto-occipital dissociation. C1 and C2 appear intact and aligned. Mild motion artifact C3 and C4. No acute osseous abnormality identified; corticated ossific fragment at chronically hypertrophied anterior left C2-C3 facets (sagittal image 77). And questionable nondisplaced linear lucency through the right C7 facet on series 6,  image 77 is not correlated on the other imaging planes and appears to be artifact. Soft tissues and spinal canal: No prevertebral fluid or swelling. No visible canal hematoma. Negative visible noncontrast neck soft tissues aside from left carotid bifurcation atherosclerosis. Disc levels: Upper cervical facet arthropathy maximal on the left C2-C3. Lower cervical disc and endplate degeneration. Mild if any associated spinal stenosis at C5-C6. Upper chest: Chest CT reported separately. Other: Right mandible motion artifact. Carious dentition with bulky periapical lucency left maxillary molar, right maxillary bicuspid. IMPRESSION: 1. No acute traumatic injury identified in the cervical spine. 2. Cervical spine degeneration with possible mild spinal stenosis at C5-C6. 3. Poor dentition. Electronically Signed   By: Genevie Ann M.D.   On: 02/08/2022 07:07   CT KNEE LEFT WO CONTRAST  Result Date: 01/25/2022 CLINICAL DATA:  Chronic knee pain for the past 3 weeks EXAM: CT OF THE LEFT KNEE WITHOUT CONTRAST TECHNIQUE: Multidetector CT imaging of the LEFT knee was performed according to the standard protocol. Multiplanar CT image reconstructions were also generated. RADIATION DOSE REDUCTION: This exam was performed according to the departmental dose-optimization program which includes automated exposure control, adjustment of the mA and/or kV according to patient size and/or use of iterative reconstruction technique. COMPARISON:  None. FINDINGS: Bones/Joint/Cartilage Hip demonstrates no fracture or dislocation. No lytic or blastic lesion. Moderate osteoarthritis of the left SI joint. Knee demonstrates no fracture or dislocation. No lytic or blastic lesion. Severe osteoarthritis of the medial femorotibial compartment with marginal osteophytes. Mild lateral femorotibial compartment with small marginal osteophytes. Moderate-severe osteoarthritis of the patellofemoral compartment with marginal osteophytes. 11 mm loose body along the  anterior aspect of the ACL. Severe osteoarthritis of the proximal tibia-fibular joint. Small joint effusion. Ankle demonstrates no fracture or dislocation. No lytic or blastic lesion. Moderate osteoarthritis of the tibiotalar joint. Mild osteoarthritis of the talonavicular joint. Mild osteoarthritis of the posterior subtalar joint. Ligaments Ligaments are suboptimally evaluated by CT. Muscles and Tendons Muscles are normal. Quadriceps tendon and patellar tendon are intact. Flexor, extensor, peroneal and Achilles tendons are grossly intact. Soft tissue No fluid collection or hematoma.  No soft tissue mass. IMPRESSION: 1. Tricompartmental osteoarthritis of the left knee, most severe in the medial femorotibial compartment. Electronically Signed   By: Kathreen Devoid M.D.   On: 01/25/2022 10:54   DG Chest Port 1 View  Result Date: 02/08/2022 CLINICAL DATA:  71 year old female status post fall with weakness and dizziness. EXAM: PORTABLE CHEST 1 VIEW COMPARISON:  CTA chest 08/23/2017 and earlier. FINDINGS: Portable AP upright view at 0610 hours. Lower lung volumes. Allowing for this cardiac and mediastinal contours remain within normal  limits. The patient is mildly rotated to the left. Visualized tracheal air column is within normal limits. Allowing for portable technique the lungs are clear. No pneumothorax or pleural effusion. Paucity of bowel gas in the upper abdomen. No acute osseous abnormality identified. IMPRESSION: Low lung volumes. No acute cardiopulmonary abnormality or acute traumatic injury identified. Electronically Signed   By: Genevie Ann M.D.   On: 02/08/2022 06:43   US BREAST LTD UNI LEFT INC AXILLA  Result Date: 02/11/2022 CLINICAL DATA:  Patient is status post lumpectomy left breast in 2022 with a known left breast seroma. Patient has been admitted for sepsis. The patient's physician requested ultrasound scanning of the patient's known left breast seroma. EXAM: ULTRASOUND OF THE LEFT BREAST COMPARISON:   December 05, 2021 FINDINGS: Targeted ultrasound is performed, showing stable seroma at the left breast upper-outer quadrant measuring maximum 10.9 cm unchanged compared to prior ultrasound. IMPRESSION: Benign findings. RECOMMENDATION: Bilateral diagnostic mammogram back on schedule. I have discussed the findings and recommendations with the patient. If applicable, a reminder letter will be sent to the patient regarding the next appointment. BI-RADS CATEGORY  2: Benign. Electronically Signed   By: Abelardo Diesel M.D.   On: 02/11/2022 09:52   ECHOCARDIOGRAM COMPLETE  Result Date: 02/11/2022    ECHOCARDIOGRAM REPORT   Patient Name:   CARLYON NOLASCO Date of Exam: 02/10/2022 Medical Rec #:  [id number]      Height:       64.0 in Accession #:    [id number]     Weight:       239.0 lb Date of Birth:  03-24-1951      BSA:          2.110 m Patient Age:    85 years       BP:           115/77 mmHg Patient Gender: F              HR:           146 bpm. Exam Location:  [facility] Procedure: 2D Echo, Cardiac Doppler and Color Doppler Indications:     Atrial Fibrillation I48.91  History:         Patient has no prior history of Echocardiogram examinations.                  Signs/Symptoms:Murmur; Risk Factors:Hypertension and Diabetes.  Sonographer:     Sherrie Sport Referring Phys:  [id number] DEYCXKGY AGBATA Diagnosing Phys: Yolonda Kida MD  Sonographer Comments: Suboptimal apical window, no subcostal window and Technically challenging study due to limited acoustic windows. IMPRESSIONS  1. Hyperdynamic LVF.  2. Severe AS.  3. Left ventricular ejection fraction, by estimation, is >75%. The left ventricle has hyperdynamic function. The left ventricle has no regional wall motion abnormalities. There is mild left ventricular hypertrophy. Left ventricular diastolic function could not be evaluated.  4. Right ventricular systolic function is normal. The right ventricular size is normal.  5. Left atrial size was mildly dilated.  6. Right atrial  size was mildly dilated.  7. The mitral valve is normal in structure. Trivial mitral valve regurgitation.  8. The aortic valve is calcified. Aortic valve regurgitation is trivial. Severe aortic valve stenosis. FINDINGS  Left Ventricle: Left ventricular ejection fraction, by estimation, is >75%. The left ventricle has hyperdynamic function. The left ventricle has no regional wall motion abnormalities. The left ventricular internal cavity size was small. There is mild left ventricular hypertrophy. Left ventricular diastolic  function could not be evaluated. Right Ventricle: The right ventricular size is normal. No increase in right ventricular wall thickness. Right ventricular systolic function is normal. Left Atrium: Left atrial size was mildly dilated. Right Atrium: Right atrial size was mildly dilated. Pericardium: There is no evidence of pericardial effusion. Mitral Valve: The mitral valve is normal in structure. Trivial mitral valve regurgitation. Tricuspid Valve: The tricuspid valve is normal in structure. Tricuspid valve regurgitation is trivial. Aortic Valve: The aortic valve is calcified. Aortic valve regurgitation is trivial. Severe aortic stenosis is present. Aortic valve mean gradient measures 17.3 mmHg. Aortic valve peak gradient measures 29.7 mmHg. Aortic valve area, by VTI measures 0.61 cm. Pulmonic Valve: The pulmonic valve was normal in structure. Pulmonic valve regurgitation is not visualized. Aorta: The ascending aorta was not well visualized. IAS/Shunts: No atrial level shunt detected by color flow Doppler. Additional Comments: Hyperdynamic LVF. Severe AS. There is no pleural effusion.  LEFT VENTRICLE PLAX 2D LVIDd:         3.90 cm LVIDs:         2.20 cm LV PW:         1.20 cm LV IVS:        1.20 cm LVOT diam:     2.00 cm LV SV:         26 LV SV Index:   12 LVOT Area:     3.14 cm  LEFT ATRIUM            Index        RIGHT ATRIUM           Index LA diam:      3.30 cm  1.56 cm/m   RA Area:      29.30 cm LA Vol (A4C): 121.0 ml 57.34 ml/m  RA Volume:   106.00 ml 50.23 ml/m  AORTIC VALVE                     PULMONIC VALVE AV Area (Vmax):    0.61 cm      PV Vmax:        0.78 m/s AV Area (Vmean):   0.61 cm      PV Vmean:       51.900 cm/s AV Area (VTI):     0.61 cm      PV VTI:         0.116 m AV Vmax:           272.33 cm/s   PV Peak grad:   2.4 mmHg AV Vmean:          192.000 cm/s  PV Mean grad:   1.0 mmHg AV VTI:            0.424 m       RVOT Peak grad: 6 mmHg AV Peak Grad:      29.7 mmHg AV Mean Grad:      17.3 mmHg LVOT Vmax:         52.90 cm/s LVOT Vmean:        37.000 cm/s LVOT VTI:          0.083 m LVOT/AV VTI ratio: 0.20  AORTA Ao Root diam: 2.80 cm MITRAL VALVE MV Area (PHT): 5.23 cm    SHUNTS MV Decel Time: 145 msec    Systemic VTI:  0.08 m MV E velocity: 86.50 cm/s  Systemic Diam: 2.00 cm  Pulmonic VTI:  0.151 m Yolonda Kida MD Electronically signed by Yolonda Kida MD Signature Date/Time: 02/11/2022/4:13:23 PM    Final    ECHO TEE  Result Date: 02/14/2022    TRANSESOPHOGEAL ECHO REPORT   Patient Name:   DOCIE ABRAMOVICH Date of Exam: 02/14/2022 Medical Rec #:  [id number]      Height:       64.0 in Accession #:    [id number]     Weight:       240.0 lb Date of Birth:  1951-11-23      BSA:          2.114 m Patient Age:    38 years       BP:           100/55 mmHg Patient Gender: F              HR:           51 bpm. Exam Location:  [facility] Procedure: Transesophageal Echo, Cardiac Doppler, Color Doppler and Saline            Contrast Bubble Study Indications:     Atrial Flutter I48.92  History:         Patient has prior history of Echocardiogram examinations, most                  recent 02/10/2022. Signs/Symptoms:Murmur; Risk                  Factors:Hypertension and Dyslipidemia.  Sonographer:     Sherrie Sport Referring Phys:  [place] Diagnosing Phys: Ida Rogue MD PROCEDURE: After discussion of the risks and benefits of a TEE, an informed consent was  obtained from the patient. TEE procedure time was 30 minutes. The transesophogeal probe was passed without difficulty through the esophogus of the patient. Imaged were obtained with the patient in a left lateral decubitus position. Local oropharyngeal anesthetic was provided with Benzocaine spray and Cetacaine. Sedation performed by different physician. Patients was under conscious sedation during this procedure. The patient's vital signs; including heart rate, blood pressure, and oxygen saturation; remained stable throughout the procedure. The patient developed no complications during the procedure. A successful direct current cardioversion was performed at 150 joules with 1 attempt. IMPRESSIONS  1. Left ventricular ejection fraction, by estimation, is 55 to 60%. The left ventricle has normal function. The left ventricle has no regional wall motion abnormalities. There is mild left ventricular hypertrophy.  2. Right ventricular systolic function is mildly reduced. The right ventricular size is normal.  3. Left atrial size was moderately dilated. No left atrial/left atrial appendage thrombus was detected.  4. The mitral valve is normal in structure. Trivial mitral valve regurgitation. No evidence of mitral stenosis.  5. The aortic valve is tricuspid. There is severe calcifcation of the aortic valve. Aortic valve regurgitation is not visualized. Moderate to severe aortic valve stenosis by planar measurement, 0.9 to 1 cm sq. Non-coronary cusp is relatively non-mobile.  3D images obtained.  6. The inferior vena cava is normal in size with greater than 50% respiratory variability, suggesting right atrial pressure of 3 mmHg.  7. Right atrial size was mildly dilated.  8. Agitated saline contrast bubble study was negative, with no evidence of any interatrial shunt. Conclusion(s)/Recommendation(s): Normal biventricular function without evidence of hemodynamically significant valvular heart disease. FINDINGS  Left Ventricle:  Left ventricular ejection fraction, by estimation, is 55 to 60%. The left ventricle has normal function.  The left ventricle has no regional wall motion abnormalities. The left ventricular internal cavity size was normal in size. There is  mild left ventricular hypertrophy. Right Ventricle: The right ventricular size is normal. No increase in right ventricular wall thickness. Right ventricular systolic function is mildly reduced. Left Atrium: Left atrial size was moderately dilated. No left atrial/left atrial appendage thrombus was detected. Right Atrium: Right atrial size was mildly dilated. Pericardium: There is no evidence of pericardial effusion. Mitral Valve: The mitral valve is normal in structure. Trivial mitral valve regurgitation. No evidence of mitral valve stenosis. Tricuspid Valve: The tricuspid valve is normal in structure. Tricuspid valve regurgitation is mild . No evidence of tricuspid stenosis. Aortic Valve: The aortic valve is tricuspid. There is severe calcifcation of the aortic valve. Aortic valve regurgitation is not visualized. Moderate to severe aortic stenosis is present. Pulmonic Valve: The pulmonic valve was normal in structure. Pulmonic valve regurgitation is not visualized. No evidence of pulmonic stenosis. Aorta: The aortic root is normal in size and structure. Venous: The inferior vena cava is normal in size with greater than 50% respiratory variability, suggesting right atrial pressure of 3 mmHg. IAS/Shunts: No atrial level shunt detected by color flow Doppler. Agitated saline contrast was given intravenously to evaluate for intracardiac shunting. Agitated saline contrast bubble study was negative, with no evidence of any interatrial shunt. Ida Rogue MD Electronically signed by Ida Rogue MD Signature Date/Time: 02/14/2022/9:46:28 AM    Final    US BREAST ASPIRATION LEFT  Result Date: 02/11/2022 INDICATION: Left breast fluid collection following lumpectomy, concern for  secondary infection EXAM: Ultrasound-guided drainage of left breast fluid collection MEDICATIONS: None. ANESTHESIA/SEDATION: Local analgesia FLUOROSCOPY TIME:  N/a COMPLICATIONS: None immediate. PROCEDURE: Informed written consent was obtained from the patient after a thorough discussion of the procedural risks, benefits and alternatives. All questions were addressed. Maximal Sterile Barrier Technique was utilized including caps, mask, sterile gowns, sterile gloves, sterile drape, hand hygiene and skin antiseptic. A timeout was performed prior to the initiation of the procedure. The patient was placed supine on the exam table. Limited ultrasound of the left breast was performed in the area of interest. This again demonstrated a mixed echogenicity predominantly hypoechoic fluid collection with internal septations. Comparison with previous imaging suggest an evolving and liquified hematoma. Skin entry site was marked, and the overlying skin was prepped and draped in a standard sterile fashion. Local analgesia was obtained with 1% lidocaine. Using ultrasound guidance, a 19 gauge Yueh catheter was advanced into the largest anechoic pocket within the identified collection. There was immediate return of thin dark red/brown fluid. A total of approximately 125 mL of this fluid was aspirated, and sent to the lab for analysis. Postprocedure imaging demonstrates appropriate decompression of the anechoic fluid components, with residual more inflammatory debris remaining. The patient tolerated all aspects of the procedure well without immediate complication. A clean dressing was placed. The patient was transferred in stable condition. IMPRESSION: Successful ultrasound-guided drainage of the left breast fluid collection with aspiration of 125 mL of thin dark red/brown fluid. A sample was sent to the lab for analysis. Imaging and aspirate findings are most suggestive of a liquified hematoma. Attention on microbiology studies for  any findings of infection. Electronically Signed   By: Albin Felling M.D.   On: 02/11/2022 15:30    Microbiology: Results for orders placed or performed during the hospital encounter of 02/08/22  Resp Panel by RT-PCR (Flu A&B, Covid) Nasopharyngeal Swab     Status:  None   Collection Time: 02/08/22  5:42 AM   Specimen: Nasopharyngeal Swab; Nasopharyngeal(NP) swabs in vial transport medium  Result Value Ref Range Status   SARS Coronavirus 2 by RT PCR NEGATIVE NEGATIVE Final    Comment: (NOTE) SARS-CoV-2 target nucleic acids are NOT DETECTED.  The SARS-CoV-2 RNA is generally detectable in upper respiratory specimens during the acute phase of infection. The lowest concentration of SARS-CoV-2 viral copies this assay can detect is 138 copies/mL. A negative result does not preclude SARS-Cov-2 infection and should not be used as the sole basis for treatment or other patient management decisions. A negative result may occur with  improper specimen collection/handling, submission of specimen other than nasopharyngeal swab, presence of viral mutation(s) within the areas targeted by this assay, and inadequate number of viral copies(<138 copies/mL). A negative result must be combined with clinical observations, patient history, and epidemiological information. The expected result is Negative.  Fact Sheet for Patients:  EntrepreneurPulse.com.au  Fact Sheet for Healthcare Providers:  IncredibleEmployment.be  This test is no t yet approved or cleared by the [place] FDA and  has been authorized for detection and/or diagnosis of SARS-CoV-2 by FDA under an Emergency Use Authorization (EUA). This EUA will remain  in effect (meaning this test can be used) for the duration of the COVID-19 declaration under Section 564(b)(1) of the Act, 21 U.S.C.section 360bbb-3(b)(1), unless the authorization is terminated  or revoked sooner.       Influenza A by PCR  NEGATIVE NEGATIVE Final   Influenza B by PCR NEGATIVE NEGATIVE Final    Comment: (NOTE) The Xpert Xpress SARS-CoV-2/FLU/RSV plus assay is intended as an aid in the diagnosis of influenza from Nasopharyngeal swab specimens and should not be used as a sole basis for treatment. Nasal washings and aspirates are unacceptable for Xpert Xpress SARS-CoV-2/FLU/RSV testing.  Fact Sheet for Patients: EntrepreneurPulse.com.au  Fact Sheet for Healthcare Providers: IncredibleEmployment.be  This test is not yet approved or cleared by the [place] FDA and has been authorized for detection and/or diagnosis of SARS-CoV-2 by FDA under an Emergency Use Authorization (EUA). This EUA will remain in effect (meaning this test can be used) for the duration of the COVID-19 declaration under Section 564(b)(1) of the Act, 21 U.S.C. section 360bbb-3(b)(1), unless the authorization is terminated or revoked.  Performed at [facility], [address] [place], [place] [address]   Blood Culture (routine x 2)     Status: None   Collection Time: 02/08/22  5:42 AM   Specimen: BLOOD RIGHT FOREARM  Result Value Ref Range Status   Specimen Description BLOOD RIGHT FOREARM  Final   Special Requests   Final    BOTTLES DRAWN AEROBIC AND ANAEROBIC Blood Culture adequate volume   Culture   Final    NO GROWTH 5 DAYS Performed at [facility], [address] [place], [place] [address]    Report Status 02/13/2022 FINAL  Final  Blood Culture (routine x 2)     Status: Abnormal   Collection Time: 02/08/22  5:42 AM   Specimen: BLOOD  Result Value Ref Range Status   Specimen Description   Final    BLOOD RIGHT ANTECUBITAL Performed at [facility], [place] [address], [place], [place] [address]    Special Requests   Final    BOTTLES DRAWN AEROBIC AND ANAEROBIC Blood Culture adequate volume Performed at [facility], [address] [place],  [place] [address]    Culture  Setup Time  Final    GRAM POSITIVE COCCI AEROBIC BOTTLE ONLY CRITICAL RESULT CALLED TO, READ BACK BY AND VERIFIED WITH: JASON BELUE PHARMD AT 0330 02/09/2022 [place]    Culture (A)  Final    STAPHYLOCOCCUS HOMINIS THE SIGNIFICANCE OF ISOLATING THIS ORGANISM FROM A SINGLE SET OF BLOOD CULTURES WHEN MULTIPLE SETS ARE DRAWN IS UNCERTAIN. PLEASE NOTIFY THE MICROBIOLOGY DEPARTMENT WITHIN ONE WEEK IF SPECIATION AND SENSITIVITIES ARE REQUIRED. Performed at [facility], [place] [address] [place], [place] [address]    Report Status 02/11/2022 FINAL  Final  Urine Culture     Status: None   Collection Time: 02/08/22  5:42 AM   Specimen: In/Out Cath Urine  Result Value Ref Range Status   Specimen Description   Final    IN/OUT CATH URINE Performed at [facility], [address], [place], [place] [address]    Special Requests   Final    NONE Performed at [facility], [address], [place], [place] [address]    Culture   Final    NO GROWTH Performed at [facility], [place] [address] [place], [place] [address]    Report Status 02/09/2022 FINAL  Final  Blood Culture ID Panel (Reflexed)     Status: Abnormal   Collection Time: 02/08/22  5:42 AM  Result Value Ref Range Status   Enterococcus faecalis NOT DETECTED NOT DETECTED Final   Enterococcus Faecium NOT DETECTED NOT DETECTED Final   Listeria monocytogenes NOT DETECTED NOT DETECTED Final   Staphylococcus species DETECTED (A) NOT DETECTED Final    Comment: CRITICAL RESULT CALLED TO, READ BACK BY AND VERIFIED WITH: JASON BELUE PHARMD AT 0330 02/09/2022 [place]    Staphylococcus aureus (BCID) NOT DETECTED NOT DETECTED Final   Staphylococcus epidermidis NOT DETECTED NOT DETECTED Final   Staphylococcus lugdunensis NOT DETECTED NOT DETECTED Final   Streptococcus species NOT DETECTED NOT DETECTED Final   Streptococcus agalactiae NOT DETECTED NOT DETECTED Final   Streptococcus pneumoniae NOT DETECTED  NOT DETECTED Final   Streptococcus pyogenes NOT DETECTED NOT DETECTED Final   A.calcoaceticus-baumannii NOT DETECTED NOT DETECTED Final   Bacteroides fragilis NOT DETECTED NOT DETECTED Final   Enterobacterales NOT DETECTED NOT DETECTED Final   Enterobacter cloacae complex NOT DETECTED NOT DETECTED Final   Escherichia coli NOT DETECTED NOT DETECTED Final   Klebsiella aerogenes NOT DETECTED NOT DETECTED Final   Klebsiella oxytoca NOT DETECTED NOT DETECTED Final   Klebsiella pneumoniae NOT DETECTED NOT DETECTED Final   Proteus species NOT DETECTED NOT DETECTED Final   Salmonella species NOT DETECTED NOT DETECTED Final   Serratia marcescens NOT DETECTED NOT DETECTED Final   Haemophilus influenzae NOT DETECTED NOT DETECTED Final   Neisseria meningitidis NOT DETECTED NOT DETECTED Final   Pseudomonas aeruginosa NOT DETECTED NOT DETECTED Final   Stenotrophomonas maltophilia NOT DETECTED NOT DETECTED Final   Candida albicans NOT DETECTED NOT DETECTED Final   Candida auris NOT DETECTED NOT DETECTED Final   Candida glabrata NOT DETECTED NOT DETECTED Final   Candida krusei NOT DETECTED NOT DETECTED Final   Candida parapsilosis NOT DETECTED NOT DETECTED Final   Candida tropicalis NOT DETECTED NOT DETECTED Final   Cryptococcus neoformans/gattii NOT DETECTED NOT DETECTED Final    Comment: Performed at [facility], [address] [place], [place] [address]  MRSA Next Gen by PCR, Nasal     Status: None   Collection Time: 02/08/22  2:35 PM   Specimen: Nasal Mucosa; Nasal Swab  Result Value  Ref Range Status   MRSA by PCR Next Gen NOT DETECTED NOT DETECTED Final    Comment: (NOTE) The GeneXpert MRSA Assay (FDA approved for NASAL specimens only), is one component of a comprehensive MRSA colonization surveillance program. It is not intended to diagnose MRSA infection nor to guide or monitor treatment for MRSA infections. Test performance is not FDA approved in patients less than 68  years old. Performed at [facility], [address] [place], [place] [address]   Aerobic/Anaerobic Culture w Gram Stain (surgical/deep wound)     Status: None   Collection Time: 02/11/22  3:34 PM   Specimen: Abscess; Body Fluid  Result Value Ref Range Status   Specimen Description   Final    ABSCESS Performed at [facility], [address] [place], [place] [address]    Special Requests LEFT BREAST ASPIRATION  Final   Gram Stain   Final    FEW WBC PRESENT,BOTH PMN AND MONONUCLEAR NO ORGANISMS SEEN    Culture   Final    RARE STAPHYLOCOCCUS EPIDERMIDIS NO ANAEROBES ISOLATED Performed at [facility], [address] [place], [place] [address]    Report Status 02/16/2022 FINAL  Final   Organism ID, Bacteria STAPHYLOCOCCUS EPIDERMIDIS  Final      Susceptibility   Staphylococcus epidermidis - MIC*    CIPROFLOXACIN <=0.5 SENSITIVE Sensitive     ERYTHROMYCIN >=8 RESISTANT Resistant     GENTAMICIN <=0.5 SENSITIVE Sensitive     OXACILLIN <=0.25 SENSITIVE Sensitive     TETRACYCLINE <=1 SENSITIVE Sensitive     VANCOMYCIN 2 SENSITIVE Sensitive     TRIMETH/SULFA 160 RESISTANT Resistant     CLINDAMYCIN <=0.25 SENSITIVE Sensitive     RIFAMPIN <=0.5 SENSITIVE Sensitive     Inducible Clindamycin NEGATIVE Sensitive     * RARE STAPHYLOCOCCUS EPIDERMIDIS    Labs: CBC: Recent Labs  Lab 02/12/22 0612 02/13/22 0625 02/14/22 0557 02/18/22 0628  WBC 8.2 7.9 7.2 9.3  HGB 9.9* 10.0* 9.4* 8.9*  HCT 31.2* 31.8* 29.9* 29.2*  MCV 86.9 86.9 87.4 88.8  PLT 247 257 263 250   Basic Metabolic Panel: Recent Labs  Lab 02/12/22 0612 02/13/22 0625 02/14/22 0557  NA 137 136 136  K 4.0 4.2 3.8  CL 98 97* 99  CO2 28 32 29  GLUCOSE 205* 214* 207*  BUN 13 16 17   CREATININE 0.54 0.55 0.53  CALCIUM 8.7* 8.7* 8.6*  MG 2.0 2.2 1.9  PHOS  --   --  4.2   Liver Function Tests: No results for input(s): AST, ALT, ALKPHOS, BILITOT, PROT, ALBUMIN in the last 168  hours. CBG: Recent Labs  Lab 02/17/22 0818 02/17/22 1243 02/17/22 1725 02/17/22 2031 02/18/22 0748  GLUCAP 170* 210* 165* 164* 199*    Discharge time spent: 35 minutes.  Signed: Edwin Dada, MD [facility] 02/18/2022

## 2022-02-19 DIAGNOSIS — J9611 Chronic respiratory failure with hypoxia: Secondary | ICD-10-CM | POA: Diagnosis present

## 2022-02-20 NOTE — Progress Notes (Signed)
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  Oncotype DX score is 0.  INTERVAL HISTORY: Patient returns to clinic today as a hospital follow-up after being admitted with atrial fibrillation as well as left breast cellulitis.  She currently feels well and is essentially back to her baseline. She is tolerating letrozole without significant side effects.  She has no neurologic complaints.  She denies any fevers.  She has a good appetite and denies weight loss.  She has no chest pain, shortness of breath, cough, or hemoptysis.  She denies any nausea, vomiting, constipation, or diarrhea.  She has no urinary complaints.  Patient offers no further specific complaints today.  REVIEW OF SYSTEMS:   Review of Systems  Constitutional: Negative.  Negative for fever, malaise/fatigue and weight loss.  Respiratory: Negative.  Negative for hemoptysis and shortness of breath.   Cardiovascular: Negative.  Negative for chest pain and leg swelling.  Gastrointestinal: Negative.  Negative for abdominal pain.  Genitourinary: Negative.  Negative for dysuria.  Musculoskeletal: Negative.  Negative for back pain.  Skin: Negative.  Negative for rash.  Neurological: Negative.  Negative for dizziness, focal weakness, weakness and headaches.  Psychiatric/Behavioral: Negative.  The patient is not nervous/anxious.    As per HPI. Otherwise, a complete review of systems is negative.  PAST  MEDICAL HISTORY: Past Medical History:  Diagnosis Date   Anemia    Anginal pain (HCC)    Arthritis    Breast cancer (HCC)    2022 [facility]   Chronic pain syndrome    Degenerative disc disease, lumbar    Edema of left lower extremity    Gastroparesis due to secondary diabetes (HCC)    GERD (gastroesophageal reflux disease)    Heart murmur    History of seizure disorder    Hyperlipidemia    Hypertension    Invasive carcinoma of breast (HCC) 04/25/2021   LEFT; stage 1A; grade I invasive mammary carcinoma; ER/PR (+); HER2/neu (-)   Mild aortic stenosis    Mild mitral regurgitation    Mild tricuspid regurgitation    Morbid obesity (HCC)    Opioid use    therapeutic use for Dx of chronic pain syndrome   OSA (obstructive sleep apnea)    Personal history of radiation therapy    Recurrent falls while walking    uses rolling walker    Requires supplemental oxygen    2L/[place] at bedtime   T2DM (type 2 diabetes mellitus) (HCC)    Tubular adenoma of colon     PAST SURGICAL HISTORY: Past Surgical History:  Procedure Laterality Date   ABDOMINAL HYSTERECTOMY  1991   partial   BREAST BIOPSY Left 0505/2022   u/s bx-"X" clip-INVASIVE MAMMARY CARCINOMA WITH TUBULAR FEATURES   BREAST CYST ASPIRATION Right 07/16/2016   neg   BREAST LUMPECTOMY,RADIO FREQ LOCALIZER,AXILLARY SENTINEL LYMPH NODE BIOPSY Left 05/22/2021   Procedure: BREAST LUMPECTOMY,RADIO FREQ LOCALIZER,AXILLARY SENTINEL LYMPH NODE BIOPSY;  Surgeon: Campbell Lerner, MD;  Location: [facility] ORS;  Service: General;  Laterality: Left;   CARDIOVERSION N/A 02/14/2022  Procedure: CARDIOVERSION;  Surgeon: Minna Merritts, MD;  Location: [facility] ORS;  Service: Cardiovascular;  Laterality: N/A;  TEE and cardioversion   CHOLECYSTECTOMY     COLONOSCOPY WITH PROPOFOL N/A 07/30/2016   Procedure: COLONOSCOPY WITH PROPOFOL;  Surgeon: Lollie Sails, MD;  Location: [facility] ENDOSCOPY;  Service: Endoscopy;  Laterality: N/A;   ESOPHAGOGASTRODUODENOSCOPY (EGD)  WITH PROPOFOL N/A 09/09/2017   Procedure: ESOPHAGOGASTRODUODENOSCOPY (EGD) WITH PROPOFOL;  Surgeon: Toledo, Benay Pike, MD;  Location: [facility] ENDOSCOPY;  Service: Endoscopy;  Laterality: N/A;   EYE SURGERY Right    for pterygium   FUNCTIONAL ENDOSCOPIC SINUS SURGERY     TEE WITHOUT CARDIOVERSION N/A 02/14/2022   Procedure: TRANSESOPHAGEAL ECHOCARDIOGRAM (TEE);  Surgeon: Minna Merritts, MD;  Location: [facility] ORS;  Service: Cardiovascular;  Laterality: N/A;   TONSILLECTOMY      FAMILY HISTORY: Family History  Problem Relation Age of Onset   Stomach cancer Mother    Colon cancer Brother    Breast cancer Neg Hx     ADVANCED DIRECTIVES (Y/N):  N  HEALTH MAINTENANCE: Social History   Tobacco Use   Smoking status: Never   Smokeless tobacco: Never  Vaping Use   Vaping Use: Never used  Substance Use Topics   Alcohol use: No   Drug use: Never     Colonoscopy:  PAP:  Bone density:  Lipid panel:  No Known Allergies  Current Outpatient Medications  Medication Sig Dispense Refill   alum & mag hydroxide-simeth (MAALOX/MYLANTA) 200-200-20 MG/5ML suspension Take 30 mLs by mouth every 6 (six) hours as needed for indigestion or heartburn.     amiodarone (PACERONE) 200 MG tablet Take 1 tablet (200 mg total) by mouth daily.     apixaban (ELIQUIS) 5 MG TABS tablet Take 1 tablet (5 mg total) by mouth 2 (two) times daily. 60 tablet 3   atorvastatin (LIPITOR) 80 MG tablet Take 80 mg by mouth daily.     calcium carbonate (TUMS - DOSED IN MG ELEMENTAL CALCIUM) 500 MG chewable tablet Chew 1,000 mg by mouth 3 (three) times daily as needed for indigestion or heartburn.     Carboxymethylcellulose Sodium (ARTIFICIAL TEARS OP) Place 1 drop into both eyes daily.     cetirizine (ZYRTEC) 10 MG tablet Take 10 mg by mouth daily.     diltiazem (CARDIZEM CD) 240 MG 24 hr capsule Take 1 capsule (240 mg total) by mouth daily.     dorzolamide-timolol (COSOPT) 22.3-6.8 MG/ML ophthalmic solution Place 1 drop into  both eyes 2 (two) times daily.     fluticasone (FLONASE) 50 MCG/ACT nasal spray Place 2 sprays into both nostrils daily.     glipiZIDE (GLUCOTROL XL) 5 MG 24 hr tablet Take 5 mg by mouth daily.     HYDROcodone-acetaminophen (NORCO) 7.5-325 MG tablet Take 1 tablet by mouth 2 (two) times daily. 15 tablet 0   latanoprost (XALATAN) 0.005 % ophthalmic solution Place 1 drop into both eyes at bedtime.     letrozole (FEMARA) 2.5 MG tablet Take 1 tablet (2.5 mg total) by mouth daily. 90 tablet 3   metFORMIN (GLUCOPHAGE) 500 MG tablet Take 1,000 mg by mouth 2 (two) times daily.     metoprolol tartrate (LOPRESSOR) 50 MG tablet Take 1 tablet (50 mg total) by mouth 2 (two) times daily.     Multiple Vitamin (DAILY-VITE MULTIVITAMIN) TABS Take 1 tablet by mouth daily.     Multiple Vitamin (MULTIVITAMIN) capsule Take 1 capsule by mouth daily.     naproxen (NAPROSYN)  500 MG tablet Take 500 mg by mouth 2 (two) times daily.     OXYGEN Inhale 2 L into the lungs at bedtime.     polyethylene glycol (MIRALAX / GLYCOLAX) 17 g packet Take 17 g by mouth daily. 14 each 0   pregabalin (LYRICA) 100 MG capsule Take 1 capsule (100 mg total) by mouth 3 (three) times daily. 5 capsule 0   senna-docusate (SENOKOT-S) 8.6-50 MG tablet Take 2 tablets by mouth 2 (two) times daily.     sertraline (ZOLOFT) 100 MG tablet Take 100 mg by mouth daily.     sodium chloride (OCEAN) 0.65 % SOLN nasal spray Place 1 spray into both nostrils as needed for congestion.     tolterodine (DETROL LA) 4 MG 24 hr capsule Take 4 mg by mouth daily.     pantoprazole (PROTONIX) 40 MG tablet Take 1 tablet (40 mg total) by mouth daily. 30 tablet 1   silver sulfADIAZINE (SILVADENE) 1 % cream Apply 1 application topically daily. (Patient not taking: Reported on 02/08/2022) 50 g 0   No current facility-administered medications for this visit.    OBJECTIVE: Vitals:   02/26/22 1006  BP: (!) 81/48  Pulse: 62  Resp: 18  Temp: (!) 97.5 F (36.4 C)  SpO2: 97%      Body mass index is 42.9 kg/m.    ECOG FS:0 - Asymptomatic  General: Well-developed, well-nourished, no acute distress.  Sitting in a wheelchair. Eyes: Pink conjunctiva, anicteric sclera. HEENT: Normocephalic, moist mucous membranes. Breast: Left breast with erythema, nontender.  Easily palpable seroma in upper outer quadrant.  Patient reports erythema is improving. Lungs: No audible wheezing or coughing. Heart: Regular rate and rhythm. Abdomen: Soft, nontender, no obvious distention. Musculoskeletal: No edema, cyanosis, or clubbing. Neuro: Alert, answering all questions appropriately. Cranial nerves grossly intact. Skin: No rashes or petechiae noted. Psych: Normal affect.    LAB RESULTS:  Lab Results  Component Value Date   NA 136 02/14/2022   K 3.8 02/14/2022   CL 99 02/14/2022   CO2 29 02/14/2022   GLUCOSE 207 (H) 02/14/2022   BUN 17 02/14/2022   CREATININE 0.53 02/14/2022   CALCIUM 8.6 (L) 02/14/2022   PROT 7.7 02/08/2022   ALBUMIN 3.8 02/08/2022   AST 18 02/08/2022   ALT 14 02/08/2022   ALKPHOS 63 02/08/2022   BILITOT 1.1 02/08/2022   GFRNONAA >60 02/14/2022   GFRAA >60 08/23/2017    Lab Results  Component Value Date   WBC 9.3 02/18/2022   NEUTROABS 12.0 (H) 02/08/2022   HGB 8.9 (L) 02/18/2022   HCT 29.2 (L) 02/18/2022   MCV 88.8 02/18/2022   PLT 279 02/18/2022     STUDIES: DG Knee 2 Views Right  Result Date: 02/08/2022 CLINICAL DATA:  71 year old female status post fall with weakness and dizziness. EXAM: RIGHT KNEE - 1-2 VIEW COMPARISON:  None. FINDINGS: Bone mineralization is within normal limits. Tricompartmental joint space loss and degenerative spurring, moderate to severe in both the medial and patellofemoral compartments. On cross-table lateral view no joint effusion identified. Patella intact. No acute osseous abnormality identified. No discrete soft tissue injury. IMPRESSION: No acute fracture or dislocation identified about the right knee.  Advanced tricompartmental degeneration. Electronically Signed   By: Genevie Ann M.D.   On: 02/08/2022 06:44   CT HEAD WO CONTRAST (5MM)  Result Date: 02/08/2022 CLINICAL DATA:  71 year old female status post fall with weakness and dizziness. EXAM: CT HEAD WITHOUT CONTRAST TECHNIQUE: Contiguous axial images were obtained  from the base of the skull through the vertex without intravenous contrast. RADIATION DOSE REDUCTION: This exam was performed according to the departmental dose-optimization program which includes automated exposure control, adjustment of the mA and/or kV according to patient size and/or use of iterative reconstruction technique. COMPARISON:  Report of head CT 11/24/1997 (no images available). FINDINGS: Brain: Cerebral volume is within normal limits for age. Bulky dural calcifications. No midline shift, ventriculomegaly, mass effect, evidence of mass lesion, intracranial hemorrhage or evidence of cortically based acute infarction. Gray-white matter differentiation is within normal limits throughout the brain. Vascular: Calcified atherosclerosis at the skull base. No suspicious intracranial vascular hyperdensity. Skull: No acute osseous abnormality identified. Hyperostosis frontalis. Sinuses/Orbits: Evidence of prior ethmoidectomy with asymmetric bilateral ethmoid and frontoethmoidal mucoperiosteal thickening, probable anterior left ethmoid mucocele on series 3, image 19, 17 mm long axis. Mild nasal septal nodularity. Frontal sinuses, sphenoid and maxillary sinuses remain well aerated. Tympanic cavities are clear. Left mastoids are clear. There is trace right mastoid fluid. Other: Negative visible nasopharynx. No orbit or scalp soft tissue injury identified. IMPRESSION: 1. No acute traumatic injury identified. 2. Normal for age non contrast CT appearance of the brain. 3. Chronic appearing ethmoid sinus disease with possible prior ethmoid sinus surgery. 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 4.  Trace right mastoid fluid, likely postinflammatory and significance doubtful. Electronically Signed   By: Genevie Ann M.D.   On: 02/08/2022 07:02   CT Chest W Contrast  Result Date: 02/08/2022 CLINICAL DATA:  71 year old female status post fall with weakness and dizziness. Sepsis. ?Breast cancer status post radiation?Marland Kitchen EXAM: CT CHEST WITH CONTRAST TECHNIQUE: Multidetector CT imaging of the chest was performed during intravenous contrast administration. RADIATION DOSE REDUCTION: This exam was performed according to the departmental dose-optimization program which includes automated exposure control, adjustment of the mA and/or kV according to patient size and/or use of iterative reconstruction technique. CONTRAST:  34mL OMNIPAQUE IOHEXOL 300 MG/ML  SOLN COMPARISON:  Chest CT 08/23/2017. Cervical spine CT today. FINDINGS: Cardiovascular: Calcified aortic atherosclerosis. Mild central pulmonary artery enlargement is chronic. Cardiac size at the upper limits of normal. No pericardial effusion. Mediastinum/Nodes: Small mediastinal lymph nodes have not significantly changed since 2018 and are likely postinflammatory. Lungs/Pleura: Major airways are patent. New subpleural and patchy peribronchial opacity in the anterior left upper lobe and lingula is likely post radiation related in this setting (series 3, image 61, see left chest wall findings below). Elsewhere subpleural scarring is chronic but progressed since 2018, especially in the lateral segment of the right middle lobe with associated bronchiectasis there. No pleural effusion. No other confluent pulmonary opacity. No suspicious pulmonary nodule. Small 4 mm left costophrenic angle nodule is stable since 2018 and benign. Upper Abdomen: Chronically absent gallbladder. Negative visible liver, spleen, adrenal glands, kidneys, and bowel in the upper abdomen. Fatty atrophied pancreas. Musculoskeletal: Osteopenia. No acute rib fracture identified. Sternum intact. Advanced  disc degeneration in the lower thoracic and visible upper lumbar spine. Chronic flowing thoracic endplate osteophytes and interbody ankylosis. Other findings: Partially visible swollen left breast with extensive inflammatory stranding and large roughly 11 cm diameter encapsulated fluid collection (series 2, image 43) with simple fluid density. Left axillary surgical clips. No axillary lymphadenopathy. IMPRESSION: 1. Partially visible swollen left breast with large, roughly 11 cm diameter encapsulated fluid collection. This may be a postoperative seroma. And extensive surrounding chest wall inflammation, with similar inflammatory changes in the underlying anterior left lung, is likely sequelae of XRT. Left axillary surgical clips. 2. No  superimposed acute traumatic injury identified in the chest. 3. No definite thoracic metastatic disease; small mediastinal lymph nodes have not significantly changed since 2018 and are likely postinflammatory. 4. Aortic Atherosclerosis (ICD10-I70.0). Electronically Signed   By: Genevie Ann M.D.   On: 02/08/2022 07:14   CT Cervical Spine Wo Contrast  Result Date: 02/08/2022 CLINICAL DATA:  71 year old female status post fall with weakness and dizziness. EXAM: CT CERVICAL SPINE WITHOUT CONTRAST TECHNIQUE: Multidetector CT imaging of the cervical spine was performed without intravenous contrast. Multiplanar CT image reconstructions were also generated. RADIATION DOSE REDUCTION: This exam was performed according to the departmental dose-optimization program which includes automated exposure control, adjustment of the mA and/or kV according to patient size and/or use of iterative reconstruction technique. COMPARISON:  Head and chest CT today reported separately. FINDINGS: Alignment: Mild straightening of cervical lordosis. Cervicothoracic junction alignment is within normal limits. Bilateral posterior element alignment is within normal limits. Skull base and vertebrae: Visualized skull  base is intact. No atlanto-occipital dissociation. C1 and C2 appear intact and aligned. Mild motion artifact C3 and C4. No acute osseous abnormality identified; corticated ossific fragment at chronically hypertrophied anterior left C2-C3 facets (sagittal image 77). And questionable nondisplaced linear lucency through the right C7 facet on series 6, image 77 is not correlated on the other imaging planes and appears to be artifact. Soft tissues and spinal canal: No prevertebral fluid or swelling. No visible canal hematoma. Negative visible noncontrast neck soft tissues aside from left carotid bifurcation atherosclerosis. Disc levels: Upper cervical facet arthropathy maximal on the left C2-C3. Lower cervical disc and endplate degeneration. Mild if any associated spinal stenosis at C5-C6. Upper chest: Chest CT reported separately. Other: Right mandible motion artifact. Carious dentition with bulky periapical lucency left maxillary molar, right maxillary bicuspid. IMPRESSION: 1. No acute traumatic injury identified in the cervical spine. 2. Cervical spine degeneration with possible mild spinal stenosis at C5-C6. 3. Poor dentition. Electronically Signed   By: Genevie Ann M.D.   On: 02/08/2022 07:07   DG Chest Port 1 View  Result Date: 02/08/2022 CLINICAL DATA:  71 year old female status post fall with weakness and dizziness. EXAM: PORTABLE CHEST 1 VIEW COMPARISON:  CTA chest 08/23/2017 and earlier. FINDINGS: Portable AP upright view at 0610 hours. Lower lung volumes. Allowing for this cardiac and mediastinal contours remain within normal limits. The patient is mildly rotated to the left. Visualized tracheal air column is within normal limits. Allowing for portable technique the lungs are clear. No pneumothorax or pleural effusion. Paucity of bowel gas in the upper abdomen. No acute osseous abnormality identified. IMPRESSION: Low lung volumes. No acute cardiopulmonary abnormality or acute traumatic injury identified.  Electronically Signed   By: Genevie Ann M.D.   On: 02/08/2022 06:43   US BREAST LTD UNI LEFT INC AXILLA  Result Date: 02/11/2022 CLINICAL DATA:  Patient is status post lumpectomy left breast in 2022 with a known left breast seroma. Patient has been admitted for sepsis. The patient's physician requested ultrasound scanning of the patient's known left breast seroma. EXAM: ULTRASOUND OF THE LEFT BREAST COMPARISON:  December 05, 2021 FINDINGS: Targeted ultrasound is performed, showing stable seroma at the left breast upper-outer quadrant measuring maximum 10.9 cm unchanged compared to prior ultrasound. IMPRESSION: Benign findings. RECOMMENDATION: Bilateral diagnostic mammogram back on schedule. I have discussed the findings and recommendations with the patient. If applicable, a reminder letter will be sent to the patient regarding the next appointment. BI-RADS CATEGORY  2: Benign. Electronically Signed  By: Abelardo Diesel M.D.   On: 02/11/2022 09:52   ECHOCARDIOGRAM COMPLETE  Result Date: 02/11/2022    ECHOCARDIOGRAM REPORT   Patient Name:   AZARYAH HEATHCOCK Date of Exam: 02/10/2022 Medical Rec #:  [id number]      Height:       64.0 in Accession #:    [id number]     Weight:       239.0 lb Date of Birth:  Feb 26, 1951      BSA:          2.110 m Patient Age:    81 years       BP:           115/77 mmHg Patient Gender: F              HR:           146 bpm. Exam Location:  [facility] Procedure: 2D Echo, Cardiac Doppler and Color Doppler Indications:     Atrial Fibrillation I48.91  History:         Patient has no prior history of Echocardiogram examinations.                  Signs/Symptoms:Murmur; Risk Factors:Hypertension and Diabetes.  Sonographer:     Sherrie Sport Referring Phys:  [id number] AGBATA Diagnosing Phys: Yolonda Kida MD  Sonographer Comments: Suboptimal apical window, no subcostal window and Technically challenging study due to limited acoustic windows. IMPRESSIONS  1. Hyperdynamic LVF.  2. Severe AS.  3. Left  ventricular ejection fraction, by estimation, is >75%. The left ventricle has hyperdynamic function. The left ventricle has no regional wall motion abnormalities. There is mild left ventricular hypertrophy. Left ventricular diastolic function could not be evaluated.  4. Right ventricular systolic function is normal. The right ventricular size is normal.  5. Left atrial size was mildly dilated.  6. Right atrial size was mildly dilated.  7. The mitral valve is normal in structure. Trivial mitral valve regurgitation.  8. The aortic valve is calcified. Aortic valve regurgitation is trivial. Severe aortic valve stenosis. FINDINGS  Left Ventricle: Left ventricular ejection fraction, by estimation, is >75%. The left ventricle has hyperdynamic function. The left ventricle has no regional wall motion abnormalities. The left ventricular internal cavity size was small. There is mild left ventricular hypertrophy. Left ventricular diastolic function could not be evaluated. Right Ventricle: The right ventricular size is normal. No increase in right ventricular wall thickness. Right ventricular systolic function is normal. Left Atrium: Left atrial size was mildly dilated. Right Atrium: Right atrial size was mildly dilated. Pericardium: There is no evidence of pericardial effusion. Mitral Valve: The mitral valve is normal in structure. Trivial mitral valve regurgitation. Tricuspid Valve: The tricuspid valve is normal in structure. Tricuspid valve regurgitation is trivial. Aortic Valve: The aortic valve is calcified. Aortic valve regurgitation is trivial. Severe aortic stenosis is present. Aortic valve mean gradient measures 17.3 mmHg. Aortic valve peak gradient measures 29.7 mmHg. Aortic valve area, by VTI measures 0.61 cm. Pulmonic Valve: The pulmonic valve was normal in structure. Pulmonic valve regurgitation is not visualized. Aorta: The ascending aorta was not well visualized. IAS/Shunts: No atrial level shunt detected by  color flow Doppler. Additional Comments: Hyperdynamic LVF. Severe AS. There is no pleural effusion.  LEFT VENTRICLE PLAX 2D LVIDd:         3.90 cm LVIDs:         2.20 cm LV PW:         1.20  cm LV IVS:        1.20 cm LVOT diam:     2.00 cm LV SV:         26 LV SV Index:   12 LVOT Area:     3.14 cm  LEFT ATRIUM            Index        RIGHT ATRIUM           Index LA diam:      3.30 cm  1.56 cm/m   RA Area:     29.30 cm LA Vol (A4C): 121.0 ml 57.34 ml/m  RA Volume:   106.00 ml 50.23 ml/m  AORTIC VALVE                     PULMONIC VALVE AV Area (Vmax):    0.61 cm      PV Vmax:        0.78 m/s AV Area (Vmean):   0.61 cm      PV Vmean:       51.900 cm/s AV Area (VTI):     0.61 cm      PV VTI:         0.116 m AV Vmax:           272.33 cm/s   PV Peak grad:   2.4 mmHg AV Vmean:          192.000 cm/s  PV Mean grad:   1.0 mmHg AV VTI:            0.424 m       RVOT Peak grad: 6 mmHg AV Peak Grad:      29.7 mmHg AV Mean Grad:      17.3 mmHg LVOT Vmax:         52.90 cm/s LVOT Vmean:        37.000 cm/s LVOT VTI:          0.083 m LVOT/AV VTI ratio: 0.20  AORTA Ao Root diam: 2.80 cm MITRAL VALVE MV Area (PHT): 5.23 cm    SHUNTS MV Decel Time: 145 msec    Systemic VTI:  0.08 m MV E velocity: 86.50 cm/s  Systemic Diam: 2.00 cm                            Pulmonic VTI:  0.151 m Yolonda Kida MD Electronically signed by Yolonda Kida MD Signature Date/Time: 02/11/2022/4:13:23 PM    Final    ECHO TEE  Result Date: 02/14/2022    TRANSESOPHOGEAL ECHO REPORT   Patient Name:   SIOMARA BURKEL Date of Exam: 02/14/2022 Medical Rec #:  [id number]      Height:       64.0 in Accession #:    [id number]     Weight:       240.0 lb Date of Birth:  1951/07/16      BSA:          2.114 m Patient Age:    47 years       BP:           100/55 mmHg Patient Gender: F              HR:           51 bpm. Exam Location:  [facility] Procedure: Transesophageal Echo, Cardiac Doppler, Color Doppler and Saline  Contrast Bubble Study Indications:      Atrial Flutter I48.92  History:         Patient has prior history of Echocardiogram examinations, most                  recent 02/10/2022. Signs/Symptoms:Murmur; Risk                  Factors:Hypertension and Dyslipidemia.  Sonographer:     Sherrie Sport Referring Phys:  [place] Diagnosing Phys: Ida Rogue MD PROCEDURE: After discussion of the risks and benefits of a TEE, an informed consent was obtained from the patient. TEE procedure time was 30 minutes. The transesophogeal probe was passed without difficulty through the esophogus of the patient. Imaged were obtained with the patient in a left lateral decubitus position. Local oropharyngeal anesthetic was provided with Benzocaine spray and Cetacaine. Sedation performed by different physician. Patients was under conscious sedation during this procedure. The patient's vital signs; including heart rate, blood pressure, and oxygen saturation; remained stable throughout the procedure. The patient developed no complications during the procedure. A successful direct current cardioversion was performed at 150 joules with 1 attempt. IMPRESSIONS  1. Left ventricular ejection fraction, by estimation, is 55 to 60%. The left ventricle has normal function. The left ventricle has no regional wall motion abnormalities. There is mild left ventricular hypertrophy.  2. Right ventricular systolic function is mildly reduced. The right ventricular size is normal.  3. Left atrial size was moderately dilated. No left atrial/left atrial appendage thrombus was detected.  4. The mitral valve is normal in structure. Trivial mitral valve regurgitation. No evidence of mitral stenosis.  5. The aortic valve is tricuspid. There is severe calcifcation of the aortic valve. Aortic valve regurgitation is not visualized. Moderate to severe aortic valve stenosis by planar measurement, 0.9 to 1 cm sq. Non-coronary cusp is relatively non-mobile.  3D images obtained.  6. The inferior vena  cava is normal in size with greater than 50% respiratory variability, suggesting right atrial pressure of 3 mmHg.  7. Right atrial size was mildly dilated.  8. Agitated saline contrast bubble study was negative, with no evidence of any interatrial shunt. Conclusion(s)/Recommendation(s): Normal biventricular function without evidence of hemodynamically significant valvular heart disease. FINDINGS  Left Ventricle: Left ventricular ejection fraction, by estimation, is 55 to 60%. The left ventricle has normal function. The left ventricle has no regional wall motion abnormalities. The left ventricular internal cavity size was normal in size. There is  mild left ventricular hypertrophy. Right Ventricle: The right ventricular size is normal. No increase in right ventricular wall thickness. Right ventricular systolic function is mildly reduced. Left Atrium: Left atrial size was moderately dilated. No left atrial/left atrial appendage thrombus was detected. Right Atrium: Right atrial size was mildly dilated. Pericardium: There is no evidence of pericardial effusion. Mitral Valve: The mitral valve is normal in structure. Trivial mitral valve regurgitation. No evidence of mitral valve stenosis. Tricuspid Valve: The tricuspid valve is normal in structure. Tricuspid valve regurgitation is mild . No evidence of tricuspid stenosis. Aortic Valve: The aortic valve is tricuspid. There is severe calcifcation of the aortic valve. Aortic valve regurgitation is not visualized. Moderate to severe aortic stenosis is present. Pulmonic Valve: The pulmonic valve was normal in structure. Pulmonic valve regurgitation is not visualized. No evidence of pulmonic stenosis. Aorta: The aortic root is normal in size and structure. Venous: The inferior vena cava is normal in size with greater than 50% respiratory  variability, suggesting right atrial pressure of 3 mmHg. IAS/Shunts: No atrial level shunt detected by color flow Doppler. Agitated saline  contrast was given intravenously to evaluate for intracardiac shunting. Agitated saline contrast bubble study was negative, with no evidence of any interatrial shunt. Ida Rogue MD Electronically signed by Ida Rogue MD Signature Date/Time: 02/14/2022/9:46:28 AM    Final    US BREAST ASPIRATION LEFT  Result Date: 02/11/2022 INDICATION: Left breast fluid collection following lumpectomy, concern for secondary infection EXAM: Ultrasound-guided drainage of left breast fluid collection MEDICATIONS: None. ANESTHESIA/SEDATION: Local analgesia FLUOROSCOPY TIME:  N/a COMPLICATIONS: None immediate. PROCEDURE: Informed written consent was obtained from the patient after a thorough discussion of the procedural risks, benefits and alternatives. All questions were addressed. Maximal Sterile Barrier Technique was utilized including caps, mask, sterile gowns, sterile gloves, sterile drape, hand hygiene and skin antiseptic. A timeout was performed prior to the initiation of the procedure. The patient was placed supine on the exam table. Limited ultrasound of the left breast was performed in the area of interest. This again demonstrated a mixed echogenicity predominantly hypoechoic fluid collection with internal septations. Comparison with previous imaging suggest an evolving and liquified hematoma. Skin entry site was marked, and the overlying skin was prepped and draped in a standard sterile fashion. Local analgesia was obtained with 1% lidocaine. Using ultrasound guidance, a 19 gauge Yueh catheter was advanced into the largest anechoic pocket within the identified collection. There was immediate return of thin dark red/brown fluid. A total of approximately 125 mL of this fluid was aspirated, and sent to the lab for analysis. Postprocedure imaging demonstrates appropriate decompression of the anechoic fluid components, with residual more inflammatory debris remaining. The patient tolerated all aspects of the procedure  well without immediate complication. A clean dressing was placed. The patient was transferred in stable condition. IMPRESSION: Successful ultrasound-guided drainage of the left breast fluid collection with aspiration of 125 mL of thin dark red/brown fluid. A sample was sent to the lab for analysis. Imaging and aspirate findings are most suggestive of a liquified hematoma. Attention on microbiology studies for any findings of infection. Electronically Signed   By: Albin Felling M.D.   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  Oncotype DX score 0.  PLAN:    1. 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  Given her Oncotype DX score of 0, adjuvant chemotherapy was not necessary.  Patient completed adjuvant XRT on July 23, 2021.  Continue letrozole for total of 5 years completing treatment in August 2027.  No further intervention is needed.  She will require a repeat mammogram in August 2023.  Patient has been instructed to keep her previously scheduled follow-up in May 2023 for routine evaluation.   2.  Osteopenia: Baseline bone mineral density on July 30, 2021 revealed a T score of -1.2.  Continue calcium and vitamin D supplementation.  Repeat in August 2023 along with mammogram as above. 3.  Atrial fibrillation: Continue follow-up with cardiology as scheduled. 4.  Left breast cellulitis: Patient continues with antibiotics.  Symptoms are improved, but she may need an extended course of treatment.  She has been instructed to keep her follow-up appointment with surgery tomorrow.  Unclear if seroma needs additional aspiration.   Patient expressed understanding and was in agreement  with this plan. She also understands that She can call clinic at any time with any questions,  concerns, or complaints.    Cancer Staging  Carcinoma of upper-outer quadrant of female breast, left [facility]) Staging form: Breast, AJCC 8th Edition - Clinical stage from 05/02/2021: Stage IA (cT1b, cN0, cM0, G1, ER+, PR+, HER2-) - Signed by Lloyd Huger, MD on 05/07/2021 Histologic grading system: 3 grade system   Lloyd Huger, MD   02/26/2022 11:36 AM

## 2022-02-26 ENCOUNTER — Other Ambulatory Visit: Payer: Self-pay

## 2022-02-26 ENCOUNTER — Inpatient Hospital Stay: Payer: Medicare HMO | Attending: Oncology | Admitting: Oncology

## 2022-02-26 VITALS — BP 81/48 | HR 62 | Temp 97.5°F | Resp 18 | Ht 64.0 in | Wt 249.9 lb

## 2022-02-26 DIAGNOSIS — N61 Mastitis without abscess: Secondary | ICD-10-CM | POA: Diagnosis not present

## 2022-02-26 DIAGNOSIS — I4891 Unspecified atrial fibrillation: Secondary | ICD-10-CM | POA: Diagnosis not present

## 2022-02-26 DIAGNOSIS — Z170 Estrogen receptor positive status [ER+]: Secondary | ICD-10-CM | POA: Insufficient documentation

## 2022-02-26 DIAGNOSIS — C50412 Malignant neoplasm of upper-outer quadrant of left female breast: Secondary | ICD-10-CM | POA: Insufficient documentation

## 2022-02-26 DIAGNOSIS — M8580 Other specified disorders of bone density and structure, unspecified site: Secondary | ICD-10-CM | POA: Insufficient documentation

## 2022-02-26 NOTE — Progress Notes (Signed)
Pt c/o constipation, and shortness of breath on exertion.  ?

## 2022-02-27 ENCOUNTER — Ambulatory Visit: Payer: Medicare HMO | Admitting: Surgery

## 2022-03-04 ENCOUNTER — Inpatient Hospital Stay: Admit: 2022-03-04 | Payer: Medicare HMO

## 2022-03-04 ENCOUNTER — Encounter: Payer: Self-pay | Admitting: Surgery

## 2022-03-04 ENCOUNTER — Other Ambulatory Visit: Payer: Self-pay

## 2022-03-04 ENCOUNTER — Ambulatory Visit (INDEPENDENT_AMBULATORY_CARE_PROVIDER_SITE_OTHER): Payer: Medicare HMO | Admitting: Surgery

## 2022-03-04 VITALS — BP 104/66 | HR 62 | Temp 99.1°F | Ht 64.0 in | Wt 249.0 lb

## 2022-03-04 DIAGNOSIS — N6489 Other specified disorders of breast: Secondary | ICD-10-CM

## 2022-03-04 NOTE — Progress Notes (Signed)
Patient returns today with a known postop/postradiation left breast seroma.  She has no evidence of infection today, though the fluid has recurred, is still ballotable and still has the associated edema in the axillary area.  Her heart rates in the 60s today.  There is no erythema on exam.  I have uncovered the prior bleb site which also has no induration or drainage.  There is some superficial discoloration likely from some retained hematogenous serum dried to the skin.  I believe we may stop covering the bleb site as long as it remains clean and dry.  I will be glad to see her back as needed.  I do not anticipate any role for future aspiration or drainage considering the morbidity of causing infection, or creating a wound that will be difficult to heal. ?

## 2022-03-04 NOTE — Patient Instructions (Signed)
PLease call the office with any questions or concerns.  ?

## 2022-03-10 ENCOUNTER — Other Ambulatory Visit: Payer: Self-pay

## 2022-03-10 ENCOUNTER — Emergency Department: Payer: Medicare HMO

## 2022-03-10 ENCOUNTER — Encounter: Payer: Self-pay | Admitting: *Deleted

## 2022-03-10 ENCOUNTER — Emergency Department
Admission: EM | Admit: 2022-03-10 | Discharge: 2022-03-12 | Disposition: A | Discharge status: Home / Self Care | Payer: Medicare HMO | Attending: Emergency Medicine | Admitting: Emergency Medicine

## 2022-03-10 DIAGNOSIS — M7989 Other specified soft tissue disorders: Secondary | ICD-10-CM | POA: Diagnosis not present

## 2022-03-10 DIAGNOSIS — R0789 Other chest pain: Secondary | ICD-10-CM | POA: Insufficient documentation

## 2022-03-10 DIAGNOSIS — R600 Localized edema: Secondary | ICD-10-CM | POA: Diagnosis present

## 2022-03-10 DIAGNOSIS — Z20822 Contact with and (suspected) exposure to covid-19: Secondary | ICD-10-CM | POA: Diagnosis not present

## 2022-03-10 DIAGNOSIS — R609 Edema, unspecified: Secondary | ICD-10-CM

## 2022-03-10 LAB — BASIC METABOLIC PANEL
Anion gap: 6 (ref 5–15)
BUN: 41 mg/dL — ABNORMAL HIGH (ref 8–23)
CO2: 27 mmol/L (ref 22–32)
Calcium: 8.8 mg/dL — ABNORMAL LOW (ref 8.9–10.3)
Chloride: 105 mmol/L (ref 98–111)
Creatinine, Ser: 0.83 mg/dL (ref 0.44–1.00)
GFR, Estimated: >60 mL/min (ref >60)
Glucose, Bld: 70 mg/dL (ref 70–99)
Potassium: 4.8 mmol/L (ref 3.5–5.1)
Sodium: 138 mmol/L (ref 135–145)

## 2022-03-10 LAB — CBC
HCT: 29.2 % — ABNORMAL LOW (ref 36.0–46.0)
Hemoglobin: 8.6 g/dL — ABNORMAL LOW (ref 12.0–15.0)
MCH: 26.1 pg (ref 26.0–34.0)
MCHC: 29.5 g/dL — ABNORMAL LOW (ref 30.0–36.0)
MCV: 88.5 fL (ref 80.0–100.0)
Platelets: 257 10*3/uL (ref 150–400)
RBC: 3.3 MIL/uL — ABNORMAL LOW (ref 3.87–5.11)
RDW: 16.3 % — ABNORMAL HIGH (ref 11.5–15.5)
WBC: 6.7 10*3/uL (ref 4.0–10.5)
nRBC: 0 % (ref 0.0–0.2)

## 2022-03-10 LAB — RESP PANEL BY RT-PCR (FLU A&B, COVID) ARPGX2
Influenza A by PCR: NEGATIVE
Influenza B by PCR: NEGATIVE
SARS Coronavirus 2 by RT PCR: NEGATIVE

## 2022-03-10 LAB — TROPONIN I (HIGH SENSITIVITY)
Troponin I (High Sensitivity): 4 ng/L (ref <18)
Troponin I (High Sensitivity): 5 ng/L (ref <18)

## 2022-03-10 MED ORDER — FUROSEMIDE 10 MG/ML IJ SOLN
40.0000 mg | Freq: Once | INTRAMUSCULAR | Status: AC
  Administered 2022-03-10: 40 mg via INTRAVENOUS
  Filled 2022-03-10: qty 4

## 2022-03-10 NOTE — ED Provider Notes (Signed)
? ?[facility] ?Provider Note ? ? ? Event Date/Time  ? First MD Initiated Contact with Patient 03/10/22 1803   ?  (approximate) ? ? ?History  ? ?Leg Swelling ? ? ?HPI ? ?Rhonda Thornton is a 71 y.o. female  who, per discharge summary dated 02/18/22 has admission for sepsis, cellulitis, who presents to the emergency department today because of concern for lower extremity edema. The patient states that the edema started 6 days ago, the day the patient left rehab. She says that the swelling has caused her to have difficulty with ambulation. They are slightly tender. She has continued to have some discomfort to her left chest where her cellulitis is but denies it being any worse then when she left the hospital. Denies any recent fevers.  ? ?  ? ? ?Physical Exam  ? ?Triage Vital Signs: ?ED Triage Vitals  ?Enc Vitals Group  ?   BP 03/10/22 1738 (!) 80/45  ?   Pulse Rate 03/10/22 1737 66  ?   Resp 03/10/22 1738 18  ?   Temp 03/10/22 1740 98.2 ?F (36.8 ?C)  ?   Temp Source 03/10/22 1740 Oral  ?   SpO2 03/10/22 1737 90 %  ?   Weight 03/10/22 1736 249 lb (112.9 kg)  ?   Height 03/10/22 1736 '5\' 4"'$  (1.626 m)  ?   Head Circumference --   ?   Peak Flow --   ?   Pain Score 03/10/22 1736 8  ?   Pain Loc --   ?   Pain Edu? --   ?   Excl. in [place]? --   ? ? ?Most recent vital signs: ?Vitals:  ? 03/10/22 1750 03/10/22 1752  ?BP: 93/66   ?Pulse: 67 65  ?Resp: 14 13  ?Temp:    ?SpO2:  96%  ? ? ?General: Awake, no distress.  ?CV:  Good peripheral perfusion.  ?Resp:  Normal effort.  ?Abd:  No distention.  ?MSK:  Bilateral lower extremity pitting edema. ?Skin:  Erythema to left chest.  ? ? ?ED Results / Procedures / Treatments  ? ?Labs ?(all labs ordered are listed, but only abnormal results are displayed) ?Labs Reviewed  ?BASIC METABOLIC PANEL - Abnormal; Notable for the following components:  ?    Result Value  ? BUN 41 (*)   ? Calcium 8.8 (*)   ? All other components within normal limits  ?CBC - Abnormal; Notable for  the following components:  ? RBC 3.30 (*)   ? Hemoglobin 8.6 (*)   ? HCT 29.2 (*)   ? MCHC 29.5 (*)   ? RDW 16.3 (*)   ? All other components within normal limits  ?RESP PANEL BY RT-PCR (FLU A&B, COVID) [id number]  ?TROPONIN I (HIGH SENSITIVITY)  ?TROPONIN I (HIGH SENSITIVITY)  ? ? ? ?EKG ? ?INance Pear, attending physician, personally viewed and interpreted this EKG ? ?EKG Time: 1741 ?Rate: 63 ?Rhythm: sinus rhythm ?Axis: normal ?Intervals: qtc 448 ?QRS: narrow, low voltage precordial leads ?ST changes: no st elevation ?Impression: abnormal ekg ? ? ? ?RADIOLOGY ?I independently interpreted and visualized the cxr. My interpretation: cardiomegaly. No pneumonia, no pneumothorax. ?Radiology interpretation:  ?IMPRESSION:  ?Cardiomegaly. There is interval decrease in pulmonary vascular  ?congestion. There are no signs of alveolar pulmonary edema or new  ?focal infiltrates.  ?   ? ?PROCEDURES: ? ?Critical Care performed: No ? ?Procedures ? ? ?MEDICATIONS ORDERED IN ED: ?Medications - No data to display ? ? ?  IMPRESSION / MDM / ASSESSMENT AND PLAN / ED COURSE  ?I reviewed the triage vital signs and the nursing notes. ?             ?               ? ?Differential diagnosis includes, but is not limited to, DVT, fluid overload, lymphedema. ? ?Patient presented to the emergency department today because of concerns for bilateral leg swelling.  Patient does state that it is interfering with her ability to ambulate.  On exam she does have bilateral lower extremity edema.  No overlying erythema.  Ultrasound without any DVTs.  Per chart review there is concern about her safety at home based on doctors note from earlier today.  Given difficulty with ambulation will have transition of care team evaluate for possible placement. ? ? ?FINAL CLINICAL IMPRESSION(S) / ED DIAGNOSES  ? ?Final diagnoses:  ?Peripheral edema  ? ? ? ? ?Note:  This document was prepared using Dragon voice recognition software and may include unintentional  dictation errors. ? ?  ?Nance Pear, MD ?03/10/22 2043 ? ?

## 2022-03-10 NOTE — ED Notes (Signed)
US at the bedside

## 2022-03-10 NOTE — ED Notes (Signed)
Pt alert iv started without diff.  Nsr on monitor.  Swelling to both lower legs.  ?

## 2022-03-10 NOTE — ED Triage Notes (Signed)
Pt brought in via ems from home with swelling to lower legs.  Pt has redness to left breast.  Pt denies chest pain or sob.  Pt alert  speech clear.  ? ?

## 2022-03-10 NOTE — ED Notes (Signed)
Patient repositioned in the bed. VSS, no acute distress noted. ?

## 2022-03-10 NOTE — ED Notes (Signed)
Report received from Amy, RN.

## 2022-03-11 ENCOUNTER — Other Ambulatory Visit: Payer: Medicare HMO

## 2022-03-11 MED ORDER — ALUM & MAG HYDROXIDE-SIMETH 200-200-20 MG/5ML PO SUSP
30.0000 mL | Freq: Two times a day (BID) | ORAL | Status: DC | PRN
  Administered 2022-03-11 – 2022-03-12 (×2): 30 mL via ORAL
  Filled 2022-03-11 (×2): qty 30

## 2022-03-11 MED ORDER — ALUM & MAG HYDROXIDE-SIMETH 200-200-20 MG/5ML PO SUSP
30.0000 mL | Freq: Once | ORAL | Status: AC
  Administered 2022-03-11: 30 mL via ORAL
  Filled 2022-03-11: qty 30

## 2022-03-11 MED ORDER — PANTOPRAZOLE SODIUM 40 MG PO TBEC
40.0000 mg | DELAYED_RELEASE_TABLET | Freq: Once | ORAL | Status: AC
  Administered 2022-03-11: 40 mg via ORAL
  Filled 2022-03-11: qty 1

## 2022-03-11 NOTE — TOC Initial Note (Addendum)
Transition of Care (TOC) - Initial/Assessment Note  ? ? ?Patient Details  ?Name: Rhonda Thornton ?MRN: [id number] ?Date of Birth: [birth year] ? ?Transition of Care (TOC) CM/SW Contact:    ?Anselm Pancoast, RN ?Phone Number: ?03/11/2022, 10:22 AM ? ?Clinical Narrative:                 ?Spoke to patient and discussed discharge options. Patient requesting to return to [facility] due to concerns about living home alone and poor mobility. Discharged from [facility] a few days ago and has been home with [facility]. Reports she is having so much fluid in her legs that she can get to the bathroom and back only before needing a break. Patient understands need for insurance approval and verbalizes she does not think insurance will cover additional stay. Patient reports no financial means for ALF placement and no family support to assist.  ? ?Expected Discharge Plan: [place] ?Barriers to Discharge: Financial Resources ? ? ?Patient Goals and CMS Choice ?Patient states their goals for this hospitalization and ongoing recovery are:: Wants to discharge to rehab but understands insurance not likely to cover ?  ?  ? ?Expected Discharge Plan and Services ?Expected Discharge Plan: [place] ?  ?  ?Post Acute Care Choice: Home Health (Active with [facility]) ?Living arrangements for the past 2 months: [place] ?                ?  ?  ?  ?  ?  ?  ?  ?  ?  ?  ? ?Prior Living Arrangements/Services ?Living arrangements for the past 2 months: [place] ?Lives with:: Self ?Patient language and need for interpreter reviewed:: Yes ?Do you feel safe going back to the place where you live?: Yes      ?Need for Family Participation in Patient Care: Yes (Comment) ?Care giver support system in place?: Yes (comment) ?Current home services: DME, Home PT, Home RN, Home OT ?Criminal Activity/Legal Involvement Pertinent to Current Situation/Hospitalization: No - Comment as needed ? ?Activities of Daily  Living ?  ?  ? ?Permission Sought/Granted ?  ?  ?   ?   ?   ?   ? ?Emotional Assessment ?Appearance:: Appears stated age ?Attitude/Demeanor/Rapport: Engaged ?Affect (typically observed): Accepting ?Orientation: : Oriented to Self, Oriented to Place, Oriented to  Time, Oriented to Situation ?Alcohol / Substance Use: Not Applicable ?Psych Involvement: No (comment) ? ?Admission diagnosis:  Leg swelling-EMS ?Patient Active Problem List  ? Diagnosis Date Noted  ? Sepsis ([place]) 02/08/2022  ? Cellulitis of left breast 02/08/2022  ? Atrial fibrillation with RVR ([place]) 02/08/2022  ? Secondary seroma of breast 12/12/2021  ? Morbid obesity ([place]) 12/12/2021  ? Pain in joint of right shoulder 11/19/2021  ? Pain of left hip joint 11/19/2021  ? Pain in joint of left knee 07/03/2021  ? Arthritis of right knee 07/03/2021  ? Carcinoma of upper-outer quadrant of female breast, left ([place]) 05/02/2021  ? Gastroparesis due to DM ([place]) 10/22/2017  ? Cardiac murmur 06/09/2016  ? 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 Controlled type 2 diabetes mellitus with hyperglycemia ([place]) 03/04/2008  ? Essential (primary) hypertension 03/04/2008  ? Hyperlipidemia 12/08/2007  ? Sleep apnea 12/08/2007  ? ?PCP:  Denton Lank, MD ?Pharmacy:   ?[facility], [place] ?[place] ?[place] [address] ?Phone: [phone] Fax: [phone] ? ?  [facility] [place], [place] AT [facility] OF [address] ?[place] ?[place] [address] ?Phone: [phone] Fax: [phone] ? ? ? ? ?Social Determinants of Health (SDOH) Interventions ?  ? ?Readmission Risk Interventions ?Readmission Risk Prevention Plan 02/15/2022  ?Transportation Screening Complete  ?PCP or Specialist Appt within 5-7 Days Complete  ?Home Care Screening Complete  ?Medication Review (RN CM) Complete  ?Some recent data might be hidden  ? ? ? ?

## 2022-03-11 NOTE — ED Notes (Signed)
Report received from Matt,RN

## 2022-03-11 NOTE — Evaluation (Signed)
Physical Therapy Evaluation ?Patient Details ?Name: Rhonda Thornton ?MRN: [id number] ?DOB: 12-29-1950 ?Today's Date: 03/11/2022 ? ?History of Present Illness ? Pt is a 71 y.o. female presenting to hospital 3/20 with concern for LE edema starting 6 days ago (the day pt left rehab) causing difficulty with ambulation.  Pt with recent hospitalization for sepsis and cellulitis.  ?Clinical Impression ? Prior to ED visit, pt was ambulatory short distances within home with walker (pt reports requiring multiple assist to ascend stairs into home when she recently discharged home from rehab); lives alone in 1 level home with 5 STE B railing.  Currently pt is min assist with bed mobility; min assist with transfers using RW; and min assist to ambulate 15 feet with RW use.  During ambulation pt demonstrating decreased B LE step length/foot clearance/heelstrike (pt reports LE's feeling heavy) and increasing trunk flexion noted with increased distance ambulating (pt reports unable to stand fully upright at baseline).  Limited distance ambulating d/t SOB and generalized weakness/fatigue; O2 sats 92% or greater on room air during sessions activities.  Pt would benefit from skilled PT to address noted impairments and functional limitations (see below for any additional details).  Upon hospital discharge, pt would benefit from SNF.   ? ?Recommendations for follow up therapy are one component of a multi-disciplinary discharge planning process, led by the attending physician.  Recommendations may be updated based on patient status, additional functional criteria and insurance authorization. ? ?Follow Up Recommendations Skilled nursing-short term rehab (<3 hours/day) ? ?  ?Assistance Recommended at Discharge Intermittent Supervision/Assistance  ?Patient can return home with the following ? A little help with walking and/or transfers;A little help with bathing/dressing/bathroom;Assistance with cooking/housework;Assist for transportation;Help  with stairs or ramp for entrance ? ?  ?Equipment Recommendations Wheelchair (measurements PT);Wheelchair cushion (measurements PT);BSC/3in1  ?Recommendations for Other Services ?    ?  ?Functional Status Assessment Patient has had a recent decline in their functional status and demonstrates the ability to make significant improvements in function in a reasonable and predictable amount of time.  ? ?  ?Precautions / Restrictions Precautions ?Precautions: Fall ?Restrictions ?Weight Bearing Restrictions: No  ? ?  ? ?Mobility ? Bed Mobility ?Overal bed mobility: Needs Assistance ?Bed Mobility: Supine to Sit, Sit to Supine ?  ?  ?Supine to sit: Min assist, HOB elevated (assist for trunk) ?Sit to supine: Min assist, HOB elevated (assist for LE's) ?  ?General bed mobility comments: vc's for technique; increased effort to perform ?  ? ?Transfers ?Overall transfer level: Needs assistance ?Equipment used: Rolling walker (2 wheels) ?Transfers: Sit to/from Stand ?Sit to Stand: Min assist ?  ?  ?  ?  ?  ?General transfer comment: assist to stand from bed and control descent sitting (x2 trials); vc's for UE placement ?  ? ?Ambulation/Gait ?Ambulation/Gait assistance: Min assist ?Gait Distance (Feet): 15 Feet ?Assistive device: Rolling walker (2 wheels) ?  ?Gait velocity: decreased ?  ?  ?General Gait Details: decreased B LE step length/foot clearance/heelstrike; pt initially with mild forward flexed trunk posture (pt reports not being able to stand fully upright at baseline) but pt with more and more forward flexed posture with increased distance ambulating; limited distance d/t pt fatigue ? ?Stairs ?  ?  ?  ?  ?  ? ?Wheelchair Mobility ?  ? ?Modified Rankin (Stroke Patients Only) ?  ? ?  ? ?Balance Overall balance assessment: Needs assistance ?Sitting-balance support: No upper extremity supported, Feet unsupported ?Sitting balance-Leahy Scale: Good ?Sitting  balance - Comments: steady sitting reaching within BOS ?  ?Standing  balance support: Bilateral upper extremity supported, During functional activity ?Standing balance-Leahy Scale: Fair ?Standing balance comment: steady static standing with B UE support on RW ?  ?  ?  ?  ?  ?  ?  ?  ?  ?  ?  ?   ? ? ? ?Pertinent Vitals/Pain Pain Assessment ?Pain Assessment: No/denies pain ?Vitals (HR and O2 on room air) stable and WFL throughout treatment session.  ? ? ?Home Living Family/patient expects to be discharged to:: Private residence ?Living Arrangements: Alone ?Available Help at Discharge: Family;Available PRN/intermittently (brother/SIL) ?Type of Home: Mobile home ?Home Access: Stairs to enter ?Entrance Stairs-Rails: Left;Right ?Entrance Stairs-Number of Steps: 5 ?  ?Home Layout: One level ?Home Equipment: Cane - single point;Hand held Engineering geologist (2 wheels);Rollator (4 wheels) ?Additional Comments: 2L O2 at night  ?  ?Prior Function Prior Level of Function : Independent/Modified Independent;History of Falls (last six months) ?  ?  ?  ?  ?  ?  ?Mobility Comments: Modified independent ambulating with RW vs 4ww depending on how pt is feeling; recent fall this past Saturday night (pt fell forwards when bending over to pick up something)--pt reports no injuries from fall ?  ?  ? ? ?Hand Dominance  ?   ? ?  ?Extremity/Trunk Assessment  ? Upper Extremity Assessment ?Upper Extremity Assessment: Generalized weakness ?  ? ?Lower Extremity Assessment ?Lower Extremity Assessment:  (B LE hip flexion 3+/5, knee flexion/extension at least 3+/5, and DF/PF at least 3/5 AROM) ?  ? ?Cervical / Trunk Assessment ?Cervical / Trunk Assessment: Other exceptions ?Cervical / Trunk Exceptions: forward head/shoulders  ?Communication  ? Communication: No difficulties  ?Cognition Arousal/Alertness: Awake/alert ?Behavior During Therapy: [facility] for tasks assessed/performed ?Overall Cognitive Status: Within Functional Limits for tasks assessed ?  ?  ?  ?  ?  ?  ?  ?  ?  ?  ?  ?  ?  ?  ?  ?  ?General  Comments: Pt pleasant and agreeable to PT session. ?  ?  ? ?  ?General Comments General comments (skin integrity, edema, etc.): scratches noted on B LE's (pt reports from fall about 1 month ago).  Nursing cleared pt for participation in physical therapy.  Pt agreeable to PT session. ? ?  ?Exercises    ? ?Assessment/Plan  ?  ?PT Assessment Patient needs continued PT services  ?PT Problem List Decreased strength;Decreased activity tolerance;Decreased balance;Decreased mobility;Decreased knowledge of use of DME;Decreased knowledge of precautions ? ?   ?  ?PT Treatment Interventions DME instruction;Gait training;Stair training;Functional mobility training;Therapeutic activities;Therapeutic exercise;Balance training;Patient/family education   ? ?PT Goals (Current goals can be found in the Care Plan section)  ?Acute Rehab PT Goals ?Patient Stated Goal: to improve strength and mobility ?PT Goal Formulation: With patient ?Time For Goal Achievement: 03/25/22 ?Potential to Achieve Goals: Good ? ?  ?Frequency Min 2X/week ?  ? ? ?Co-evaluation   ?  ?  ?  ?  ? ? ?  ?AM-PAC PT "6 Clicks" Mobility  ?Outcome Measure Help needed turning from your back to your side while in a flat bed without using bedrails?: A Little ?Help needed moving from lying on your back to sitting on the side of a flat bed without using bedrails?: A Little ?Help needed moving to and from a bed to a chair (including a wheelchair)?: A Little ?Help needed standing up from a chair using your  arms (e.g., wheelchair or bedside chair)?: A Little ?Help needed to walk in hospital room?: A Little ?Help needed climbing 3-5 steps with a railing? : Total ?6 Click Score: 16 ? ?  ?End of Session Equipment Utilized During Treatment: Gait belt ?Activity Tolerance: Patient limited by fatigue ?Patient left: in bed;with call bell/phone within reach;with bed alarm set;with family/visitor present ?Nurse Communication: Mobility status;Precautions ?PT Visit Diagnosis: Unsteadiness  on feet (R26.81);Other abnormalities of gait and mobility (R26.89);Muscle weakness (generalized) (M62.81);History of falling (Z91.81) ?  ? ?Time: 1355-1420 ?PT Time Calculation (min) (ACUTE ONLY): 25 min ? ? ?Charge

## 2022-03-11 NOTE — ED Notes (Signed)
Assisted pt to the recliner using a walker. Instructed pt that she could sit there for a little while, but then she would need to get back in the bed. Reiterated to not try to get up from the recliner, pt verbalized understanding. Alarm placed in chair for safety. ?

## 2022-03-11 NOTE — ED Provider Notes (Signed)
----------------------------------------- ?  5:39 AM on 03/11/2022 ?----------------------------------------- ? ? ?Blood pressure 104/60, pulse 67, temperature 98.2 ?F (36.8 ?C), temperature source Oral, resp. rate 14, height '5\' 4"'$  (1.626 m), weight 112.9 kg, SpO2 94 %. ? ?The patient is calm and cooperative at this time.  There have been no acute events since the last update.  Awaiting disposition plan from Social Work team. ?  ?Paulette Blanch, MD ?03/11/22 [phone] ? ?

## 2022-03-11 NOTE — ED Notes (Signed)
Pt NAD, a/ox4. Pt states she has had difficulty ambulating at home and cannot take care of herself. Pt has +3 pitting edema to BLE. Denies CP, SOB, ABD pain, n/v/d or fever. ?

## 2022-03-11 NOTE — ED Notes (Signed)
Pt brought dinner tray ?

## 2022-03-11 NOTE — ED Notes (Addendum)
Pt assisted to side of bed, given meal tray ? ?

## 2022-03-11 NOTE — ED Notes (Signed)
Pt wet with urine. Bed sheets, brief changed. New chux applied. Purewick readjusted.  ?

## 2022-03-11 NOTE — ED Notes (Signed)
Pt soils herself while sitting in recliner chair. Pt assisted back to bed, new gown, brief, purewick applied. Pt states she wants to go back to sitting in chair. ?

## 2022-03-11 NOTE — ED Notes (Signed)
Pt assisted back in bed

## 2022-03-11 NOTE — ED Notes (Signed)
Patient placed in a hospital bed. New purewick placed and pt repositioned. Stage II pressure wound observed to the buttock area. See wound documentation. VSS. Pt is requesting something for indigestion/heartburn. MD notified and aware. Awaiting orders to be placed ?

## 2022-03-11 NOTE — ED Notes (Signed)
Pt assisted into recliner chair ?

## 2022-03-11 NOTE — ED Notes (Signed)
Raquel Sarna PT at bedside for eval ?

## 2022-03-12 MED ORDER — FLUTICASONE PROPIONATE 50 MCG/ACT NA SUSP: 2.0000 | Freq: Every day | NASAL | Status: DC

## 2022-03-12 MED ORDER — SENNOSIDES-DOCUSATE SODIUM 8.6-50 MG PO TABS
2.0000 | ORAL_TABLET | Freq: Two times a day (BID) | ORAL | Status: DC
  Administered 2022-03-12: 2 via ORAL
  Filled 2022-03-12: qty 2

## 2022-03-12 MED ORDER — LETROZOLE 2.5 MG PO TABS
2.5000 mg | ORAL_TABLET | Freq: Every day | ORAL | Status: DC
Start: 2022-03-12 — End: 2022-03-12
  Administered 2022-03-12: 2.5 mg via ORAL
  Filled 2022-03-12: qty 1

## 2022-03-12 MED ORDER — ATORVASTATIN CALCIUM 20 MG PO TABS
80.0000 mg | ORAL_TABLET | Freq: Every day | ORAL | Status: DC
  Administered 2022-03-12: 80 mg via ORAL
  Filled 2022-03-12: qty 4

## 2022-03-12 MED ORDER — NAPROXEN 500 MG PO TABS
500.0000 mg | ORAL_TABLET | Freq: Two times a day (BID) | ORAL | Status: DC
Start: 2022-03-12 — End: 2022-03-12
  Administered 2022-03-12 (×2): 500 mg via ORAL
  Filled 2022-03-12 (×2): qty 1

## 2022-03-12 MED ORDER — APIXABAN 5 MG PO TABS
5.0000 mg | ORAL_TABLET | Freq: Two times a day (BID) | ORAL | Status: DC
Start: 2022-03-12 — End: 2022-03-12

## 2022-03-12 MED ORDER — FLUTICASONE PROPIONATE 50 MCG/ACT NA SUSP
2.0000 | Freq: Every day | NASAL | Status: DC
  Administered 2022-03-12: 2 via NASAL
  Filled 2022-03-12: qty 16

## 2022-03-12 MED ORDER — AMIODARONE HCL 200 MG PO TABS
200.0000 mg | ORAL_TABLET | Freq: Every day | ORAL | Status: DC
  Administered 2022-03-12: 200 mg via ORAL
  Filled 2022-03-12: qty 1

## 2022-03-12 MED ORDER — APIXABAN 5 MG PO TABS
5.0000 mg | ORAL_TABLET | Freq: Two times a day (BID) | ORAL | Status: DC
  Administered 2022-03-12: 5 mg via ORAL
  Filled 2022-03-12: qty 1

## 2022-03-12 MED ORDER — METOPROLOL TARTRATE 25 MG PO TABS
25.0000 mg | ORAL_TABLET | Freq: Every day | ORAL | Status: DC
  Administered 2022-03-12: 25 mg via ORAL
  Filled 2022-03-12: qty 1

## 2022-03-12 MED ORDER — GLIPIZIDE ER 5 MG PO TB24: 5.0000 mg | ORAL_TABLET | Freq: Every day | ORAL | Status: DC

## 2022-03-12 MED ORDER — METFORMIN HCL 500 MG PO TABS
1000.0000 mg | ORAL_TABLET | Freq: Two times a day (BID) | ORAL | Status: DC
  Administered 2022-03-12: 1000 mg via ORAL
  Filled 2022-03-12: qty 2

## 2022-03-12 MED ORDER — MELATONIN 5 MG PO TABS
2.5000 mg | ORAL_TABLET | Freq: Every day | ORAL | Status: DC
  Administered 2022-03-12: 2.5 mg via ORAL
  Filled 2022-03-12: qty 1

## 2022-03-12 MED ORDER — SERTRALINE HCL 50 MG PO TABS
100.0000 mg | ORAL_TABLET | Freq: Every day | ORAL | Status: DC
  Administered 2022-03-12: 100 mg via ORAL
  Filled 2022-03-12: qty 2

## 2022-03-12 MED ORDER — HYDROCODONE-ACETAMINOPHEN 7.5-325 MG PO TABS: 1.0000 | ORAL_TABLET | Freq: Four times a day (QID) | ORAL | Status: DC | PRN

## 2022-03-12 MED ORDER — DORZOLAMIDE HCL-TIMOLOL MAL 2-0.5 % OP SOLN: 1.0000 [drp] | Freq: Two times a day (BID) | OPHTHALMIC | Status: DC

## 2022-03-12 MED ORDER — SALINE SPRAY 0.65 % NA SOLN
1.0000 | NASAL | Status: DC | PRN
  Filled 2022-03-12: qty 44

## 2022-03-12 MED ORDER — ALUM & MAG HYDROXIDE-SIMETH 200-200-20 MG/5ML PO SUSP: 30.0000 mL | Freq: Four times a day (QID) | ORAL | Status: DC | PRN

## 2022-03-12 MED ORDER — POLYVINYL ALCOHOL 1.4 % OP SOLN
1.0000 [drp] | Freq: Every day | OPHTHALMIC | Status: DC
Start: 2022-03-12 — End: 2022-03-12
  Administered 2022-03-12: 1 [drp] via OPHTHALMIC
  Filled 2022-03-12: qty 15

## 2022-03-12 MED ORDER — DORZOLAMIDE HCL-TIMOLOL MAL 2-0.5 % OP SOLN
1.0000 [drp] | Freq: Two times a day (BID) | OPHTHALMIC | Status: DC
  Administered 2022-03-12: 1 [drp] via OPHTHALMIC
  Filled 2022-03-12: qty 10

## 2022-03-12 MED ORDER — GLIPIZIDE ER 5 MG PO TB24
5.0000 mg | ORAL_TABLET | Freq: Every day | ORAL | Status: DC
  Administered 2022-03-12: 5 mg via ORAL
  Filled 2022-03-12: qty 1

## 2022-03-12 MED ORDER — LORATADINE 10 MG PO TABS
10.0000 mg | ORAL_TABLET | Freq: Every day | ORAL | Status: DC
  Administered 2022-03-12: 10 mg via ORAL
  Filled 2022-03-12: qty 1

## 2022-03-12 MED ORDER — LETROZOLE 2.5 MG PO TABS: 2.5000 mg | ORAL_TABLET | Freq: Every day | ORAL | Status: DC

## 2022-03-12 MED ORDER — POLYVINYL ALCOHOL 1.4 % OP SOLN: 1.0000 [drp] | Freq: Every day | OPHTHALMIC | Status: DC

## 2022-03-12 MED ORDER — DILTIAZEM HCL ER COATED BEADS 240 MG PO CP24
240.0000 mg | ORAL_CAPSULE | Freq: Every day | ORAL | Status: DC
Start: 2022-03-12 — End: 2022-03-12
  Administered 2022-03-12: 240 mg via ORAL
  Filled 2022-03-12: qty 1

## 2022-03-12 MED ORDER — METFORMIN HCL 500 MG PO TABS: 1000.0000 mg | ORAL_TABLET | Freq: Two times a day (BID) | ORAL | Status: DC

## 2022-03-12 MED ORDER — ZINC OXIDE 40 % EX OINT
TOPICAL_OINTMENT | Freq: Three times a day (TID) | CUTANEOUS | Status: DC
  Filled 2022-03-12: qty 113

## 2022-03-12 MED ORDER — FESOTERODINE FUMARATE ER 4 MG PO TB24
4.0000 mg | ORAL_TABLET | Freq: Every day | ORAL | Status: DC
  Administered 2022-03-12: 4 mg via ORAL
  Filled 2022-03-12: qty 1

## 2022-03-12 NOTE — Progress Notes (Signed)
Physical Therapy Treatment ?Patient Details ?Name: Rhonda Thornton ?MRN: [id number] ?DOB: 1951/10/29 ?Today's Date: 03/12/2022 ? ? ?History of Present Illness Pt is a 71 y.o. female presenting to hospital 3/20 with concern for LE edema starting 6 days ago (the day pt left rehab) causing difficulty with ambulation.  Pt with recent hospitalization for sepsis and cellulitis. ? ?  ?PT Comments  ? ? Physical Therapy treatment completed this date. Patient tolerated session well and reported no pain throughout session. Bed mobility was not assessed as patient started and ended session in recliner. Patient was able to complete sit to stands from recliner at SBA/Sup, with 8 additional reps for BLE strengthening. Patient additionally perform LAQ, and marches for strengthening. She continues to demonstrate increased fatigue with short ambulation bouts, at [facility] and significant forward flexion. No LOB noted. Patient would continue to benefit from skilled physical therapy in order to optimize patient's safety and independence with activities of daily living. Continue to recommend STR upon discharge from acute hospitalization.  ?   ?Recommendations for follow up therapy are one component of a multi-disciplinary discharge planning process, led by the attending physician.  Recommendations may be updated based on patient status, additional functional criteria and insurance authorization. ? ?Follow Up Recommendations ? Skilled nursing-short term rehab (<3 hours/day) ?  ?  ?Assistance Recommended at Discharge Intermittent Supervision/Assistance  ?Patient can return home with the following A little help with walking and/or transfers;A little help with bathing/dressing/bathroom;Assistance with cooking/housework;Assist for transportation;Help with stairs or ramp for entrance ?  ?Equipment Recommendations ? Wheelchair (measurements PT);Wheelchair cushion (measurements PT);BSC/3in1  ?  ?Recommendations for Other Services   ? ? ?  ?Precautions /  Restrictions Precautions ?Precautions: Fall ?Restrictions ?Weight Bearing Restrictions: No  ?  ? ?Mobility ? Bed Mobility ?Overal bed mobility:  (no assessed as patient started and ended session in recliner) ?  ?  ?  ?  ?  ?  ?  ?Patient Response: Cooperative ? ?Transfers ?Overall transfer level: Needs assistance ?Equipment used: Rolling walker (2 wheels) ?Transfers: Sit to/from Stand ?Sit to Stand: Supervision ?  ?  ?  ?  ?  ?General transfer comment: Verbal cueing for proper hand placement ?  ? ?Ambulation/Gait ?Ambulation/Gait assistance: Min guard ?Gait Distance (Feet): 20 Feet ?Assistive device: Rolling walker (2 wheels) ?Gait Pattern/deviations: Step-to pattern, Trunk flexed ?Gait velocity: decreased ?  ?  ?General Gait Details: decreased B LE step length/foot clearance/heelstrike; significant forward flexion- at baseline patient reports she is unable to fully stand upright; limited distance d/t pt fatigue ? ? ?Stairs ?  ?  ?  ?  ?  ? ? ?Wheelchair Mobility ?  ? ?Modified Rankin (Stroke Patients Only) ?  ? ? ?  ?Balance Overall balance assessment: Needs assistance ?Sitting-balance support: No upper extremity supported, Feet unsupported ?Sitting balance-Leahy Scale: Good ?Sitting balance - Comments: steady sitting reaching within BOS ?  ?Standing balance support: Bilateral upper extremity supported, During functional activity ?Standing balance-Leahy Scale: Fair ?Standing balance comment: steady static standing with B UE support on RW ?  ?  ?  ?  ?  ?  ?  ?  ?  ?  ?  ?  ? ?  ?Cognition Arousal/Alertness: Awake/alert ?Behavior During Therapy: [facility] for tasks assessed/performed ?Overall Cognitive Status: Within Functional Limits for tasks assessed ?  ?  ?  ?  ?  ?  ?  ?  ?  ?  ?  ?  ?  ?  ?  ?  ?  General Comments: Pt pleasant and agreeable to PT session. ?  ?  ? ?  ?Exercises General Exercises - Lower Extremity ?Long Arc Quad: Seated, AROM, 10 reps, Both ?Hip Flexion/Marching: Seated, AROM, 10 reps, Both ?Other  Exercises ?Other Exercises: x8 additional sit to stands with RW compelted at supervision ? ?  ?General Comments General comments (skin integrity, edema, etc.): SpO2 ranged from 92-95% on room air, HR ranged from 73-90bpm ?  ?  ? ?Pertinent Vitals/Pain Pain Assessment ?Pain Assessment: No/denies pain  ? ? ?Home Living   ?  ?  ?  ?  ?  ?  ?  ?  ?  ?   ?  ?Prior Function    ?  ?  ?   ? ?PT Goals (current goals can now be found in the care plan section) Acute Rehab PT Goals ?Patient Stated Goal: to improve strength and mobility ?PT Goal Formulation: With patient ?Time For Goal Achievement: 03/25/22 ?Potential to Achieve Goals: Good ?Progress towards PT goals: Progressing toward goals ? ?  ?Frequency ? ? ? Min 2X/week ? ? ? ?  ?PT Plan Current plan remains appropriate  ? ? ?Co-evaluation   ?  ?  ?  ?  ? ?  ?AM-PAC PT "6 Clicks" Mobility   ?Outcome Measure ? Help needed turning from your back to your side while in a flat bed without using bedrails?: A Little ?Help needed moving from lying on your back to sitting on the side of a flat bed without using bedrails?: A Little ?Help needed moving to and from a bed to a chair (including a wheelchair)?: A Little ?Help needed standing up from a chair using your arms (e.g., wheelchair or bedside chair)?: A Little ?Help needed to walk in hospital room?: A Little ?Help needed climbing 3-5 steps with a railing? : Total ?6 Click Score: 16 ? ?  ?End of Session Equipment Utilized During Treatment: Gait belt ?Activity Tolerance: Patient limited by fatigue ?Patient left: in chair;with call bell/phone within reach ?Nurse Communication: Mobility status ?PT Visit Diagnosis: Unsteadiness on feet (R26.81);Other abnormalities of gait and mobility (R26.89);Muscle weakness (generalized) (M62.81);History of falling (Z91.81) ?  ? ? ?Time: 8366-2947 ?PT Time Calculation (min) (ACUTE ONLY): 15 min ? ?Charges:  $Gait Training: 8-22 mins          ?          ? ?Iva Boop, PT  ?03/12/22. 2:08  PM ? ? ?

## 2022-03-12 NOTE — ED Notes (Signed)
Pt repositioned on left side with pillow ?

## 2022-03-12 NOTE — ED Notes (Signed)
Pt resting comfortably at this time. NAD noted. Call bell in reach.  

## 2022-03-12 NOTE — ED Notes (Signed)
Pt uncomfortable laying in bed, asked to sit in recliner, pt placed in recliner with bed alarm underneath her. Call light within reach, non-slip socks in place.  ?

## 2022-03-12 NOTE — ED Notes (Signed)
Pt assisted back to bed.  Pt alert  purewick in place. Iv in place.   ?

## 2022-03-12 NOTE — ED Notes (Signed)
Pt placed back in bed after sitting on the side of the bed, pt still has purewick in placed, pt repositioned, bed alarm on at this time. VSS, will continue to monitor.  Room darkened for comfort. ?

## 2022-03-12 NOTE — ED Notes (Addendum)
This RN assisted to to beside toilet to have a  BM, pt jad a medium sized formed BM. Pt's purewick canister emptied with 700 dark yellow urine output.  ? ?This RN also noted a stage 2 pressure ulcer to buttock area, pt educated it is important to get in the bed to turn off of her back and to not stay in the recliner to help take pressure off her buttocks, pt refused at this time. This RN educated pt we would need to put some barrier cream to buttock area, pt states "yeah later we can do that, I want to rest right now".  ?

## 2022-03-12 NOTE — ED Notes (Signed)
Pt resting quietly in recliner with eyes closed, respirations equal and unlabored.   ?

## 2022-03-12 NOTE — ED Notes (Signed)
Pt resting comfortably at this time. Norma rise ans fall of chest. NAD noted. Call bell in reach. ? ?Pt in recliner at bedside.  ?

## 2022-03-12 NOTE — TOC Progression Note (Signed)
Transition of Care (TOC) - Progression Note  ? ? ?Patient Details  ?Name: Rhonda Thornton ?MRN: [id number] ?Date of Birth: 1951/06/12 ? ?Transition of Care (TOC) CM/SW Contact  ?Shelbie Hutching, RN ?Phone Number: ?03/12/2022, 9:14 AM ? ?Clinical Narrative:    ?PT is recommending SNF, patient want to return to [facility].  Bed request sent out to [facility] and other local SNF's.   ? ? ?Expected Discharge Plan: [place] ?Barriers to Discharge: Financial Resources ? ?Expected Discharge Plan and Services ?Expected Discharge Plan: [place] ?  ?  ?Post Acute Care Choice: Home Health (Active with [facility]) ?Living arrangements for the past 2 months: [place] ?                ?  ?  ?  ?  ?  ?  ?  ?  ?  ?  ? ? ?Social Determinants of Health (SDOH) Interventions ?  ? ?Readmission Risk Interventions ? ?  02/15/2022  ?  4:13 PM  ?Readmission Risk Prevention Plan  ?Transportation Screening Complete  ?PCP or Specialist Appt within 5-7 Days Complete  ?Home Care Screening Complete  ?Medication Review (RN CM) Complete  ? ? ?

## 2022-03-12 NOTE — Consult Note (Addendum)
WOC Nurse Consult Note: ?Reason for Consult:Consult requested for buttocks.  Pt states "they were sore before I came into the hospital." Pt is frequently incontinent and a Purewick medical device is in place to attempt to contain the urine. It is difficult to keep the affected area from becoming soiled and a dressing would trap the urine against the skin underneath.  ?Wound type: Bilat buttocks and inner gluteal fold with red moist macerated partial thickness skin loss.  Appearance and location are consistent with moisture associated skin damage, not a pressure injury.  95% red, 5% is a dark brown scabbed area to the right buttock, approx .3X.3cm.   ?Measurement: Affected area of MASD is approx 6X6X.1cm ? ?ICD-10 CM Codes for Irritant Dermatitis ?G25W3 - Due to fecal, urinary or dual incontinence ? ?Dressing procedure/placement/frequency: Topical treatment orders provided for bedside nurses to perform to protect and promote healing and repel moisture as follows: Apply Desitin to bilat buttocks TID and PRN when turning or cleaning. ?Please re-consult if further assistance is needed.  Thank-you,  ?Julien Girt MSN, RN, [place], [place], CNS ?[phone]  ? ?  ?

## 2022-03-12 NOTE — ED Notes (Signed)
Pt given lunch tray and assisted with condiments. Pt denies any further needs at this time. Call bell in reach.  ?

## 2022-03-12 NOTE — ED Notes (Signed)
Pt given breakfast tray and assisted with containers and condiments. Pt denies any further needs at this time.  ?

## 2022-03-12 NOTE — [place] FL2 (Signed)
?[place] MEDICAID FL2 LEVEL OF CARE SCREENING TOOL  ?  ? ?IDENTIFICATION  ?Patient Name: ?Rhonda Thornton Birthdate: [birth year] Sex: female Admission Date (Current Location): ?03/10/2022  ?[place] and [place] Number: ? [place] ?  Facility and Address:  ?[facility], [address] [place], [place] [address] ?     Provider Number: ?[id number]  ?Attending Physician Name and Address:  ?No att. providers found ? Relative Name and Phone Number:  ?[name]- Brother- [phone] ?   ?Current Level of Care: ?Hospital Recommended Level of Care: ?[place] Prior Approval Number: ?  ? ?Date Approved/Denied: ?  PASRR Number: ?[id number] A ? ?Discharge Plan: ?SNF ?  ? ?Current Diagnoses: ?Patient Active Problem List  ? Diagnosis Date Noted  ? Sepsis ([place]) 02/08/2022  ? Cellulitis of left breast 02/08/2022  ? Atrial fibrillation with RVR ([place]) 02/08/2022  ? Secondary seroma of breast 12/12/2021  ? Morbid obesity ([place]) 12/12/2021  ? Pain in joint of right shoulder 11/19/2021  ? Pain of left hip joint 11/19/2021  ? Pain in joint of left knee 07/03/2021  ? Arthritis of right knee 07/03/2021  ? Carcinoma of upper-outer quadrant of female breast, left ([place]) 05/02/2021  ? Gastroparesis due to DM ([place]) 10/22/2017  ? Cardiac murmur 06/09/2016  ? 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 Controlled type 2 diabetes mellitus with hyperglycemia ([place]) 03/04/2008  ? Essential (primary) hypertension 03/04/2008  ? Hyperlipidemia 12/08/2007  ? Sleep apnea 12/08/2007  ? ? ?Orientation RESPIRATION BLADDER Height & Weight   ?  ?Self, Time, Situation, Place ? Normal External catheter, Incontinent Weight: 112.9 kg ?Height:  '5\' 4"'$  (162.6 cm)  ?BEHAVIORAL SYMPTOMS/MOOD NEUROLOGICAL BOWEL NUTRITION STATUS  ?    Continent Diet (Regular)  ?AMBULATORY STATUS COMMUNICATION OF NEEDS Skin   ?Limited Assist Verbally Other (Comment) (MASD Buttocks- apply Desiden as needed) ?   ?  ?  ?    ?     ?     ? ? ?Personal Care Assistance Level of Assistance  ?Bathing, Feeding, Dressing Bathing Assistance: Limited assistance ?Feeding assistance: Independent ?Dressing Assistance: Limited assistance ?   ? ?Functional Limitations Info  ?Sight, Hearing, Speech Sight Info: Adequate ?Hearing Info: Adequate ?Speech Info: Adequate  ? ? ?SPECIAL CARE FACTORS FREQUENCY  ?PT (By licensed PT), OT (By licensed OT)   ?  ?PT Frequency: 5 times per week ?OT Frequency: 5 times per week ?  ?  ?  ?   ? ? ?Contractures Contractures Info: Not present  ? ? ?Additional Factors Info  ?Code Status, Allergies Code Status Info: Full ?Allergies Info: NKA ?  ?  ?  ?   ? ?Current Medications (03/12/2022):  This is the current hospital active medication list ?Current Facility-Administered Medications  ?Medication Dose Route Frequency Provider Last Rate Last Admin  ? alum & mag hydroxide-simeth (MAALOX/MYLANTA) 200-200-20 MG/5ML suspension 30 mL  30 mL Oral BID PRN Rada Hay, MD   30 mL at 03/12/22 8366  ? amiodarone (PACERONE) tablet 200 mg  200 mg Oral Daily Lucrezia Starch, MD      ? apixaban Arne Cleveland) tablet 5 mg  5 mg Oral BID Renda Rolls, RPH      ? atorvastatin (LIPITOR) tablet 80 mg  80 mg Oral Daily Lucrezia Starch, MD      ? diltiazem (CARDIZEM CD) 24 hr capsule 240 mg  240 mg Oral Daily Lucrezia Starch, MD      ?  dorzolamide-timolol (COSOPT) 22.3-6.8 MG/ML ophthalmic solution 1 drop  1 drop Both Eyes BID Renda Rolls, RPH      ? fesoterodine (TOVIAZ) tablet 4 mg  4 mg Oral Daily Lucrezia Starch, MD      ? fluticasone (FLONASE) 50 MCG/ACT nasal spray 2 spray  2 spray Each Nare Daily Renda Rolls, RPH      ? glipiZIDE (GLUCOTROL XL) 24 hr tablet 5 mg  5 mg Oral Daily Renda Rolls, RPH      ? HYDROcodone-acetaminophen (NORCO) 7.5-325 MG per tablet 1 tablet  1 tablet Oral Q6H PRN Lucrezia Starch, MD      ? letrozole [facility]) tablet 2.5 mg  2.5 mg Oral Daily Renda Rolls, RPH      ? liver  oil-zinc oxide (DESITIN) 40 % ointment   Topical TID Nance Pear, MD      ? loratadine (CLARITIN) tablet 10 mg  10 mg Oral Daily Lucrezia Starch, MD      ? melatonin tablet 2.5 mg  2.5 mg Oral QHS Lucrezia Starch, MD   2.5 mg at 03/12/22 0202  ? metFORMIN (GLUCOPHAGE) tablet 1,000 mg  1,000 mg Oral BID WC Renda Rolls, RPH      ? metoprolol tartrate (LOPRESSOR) tablet 25 mg  25 mg Oral Daily Lucrezia Starch, MD      ? naproxen (NAPROSYN) tablet 500 mg  500 mg Oral BID Lucrezia Starch, MD   500 mg at 03/12/22 0202  ? polyvinyl alcohol (LIQUIFILM TEARS) 1.4 % ophthalmic solution 1 drop  1 drop Both Eyes Daily Renda Rolls, RPH      ? senna-docusate (Senokot-S) tablet 2 tablet  2 tablet Oral BID Lucrezia Starch, MD   2 tablet at 03/12/22 0202  ? sertraline (ZOLOFT) tablet 100 mg  100 mg Oral Daily Lucrezia Starch, MD      ? sodium chloride (OCEAN) 0.65 % nasal spray 1 spray  1 spray Each Nare PRN Lucrezia Starch, MD      ? ?Current Outpatient Medications  ?Medication Sig Dispense Refill  ? amiodarone (PACERONE) 200 MG tablet Take 1 tablet (200 mg total) by mouth daily.    ? apixaban (ELIQUIS) 5 MG TABS tablet Take 1 tablet (5 mg total) by mouth 2 (two) times daily. 60 tablet 3  ? atorvastatin (LIPITOR) 80 MG tablet Take 80 mg by mouth daily.    ? calcium carbonate (TUMS - DOSED IN MG ELEMENTAL CALCIUM) 500 MG chewable tablet Chew 1,000 mg by mouth 3 (three) times daily as needed for indigestion or heartburn.    ? Carboxymethylcellulose Sodium (ARTIFICIAL TEARS OP) Place 1 drop into both eyes daily.    ? cetirizine (ZYRTEC) 10 MG tablet Take 10 mg by mouth daily.    ? diltiazem (CARDIZEM CD) 240 MG 24 hr capsule Take 1 capsule (240 mg total) by mouth daily.    ? dorzolamide-timolol (COSOPT) 22.3-6.8 MG/ML ophthalmic solution Place 1 drop into both eyes 2 (two) times daily.    ? fluticasone (FLONASE) 50 MCG/ACT nasal spray Place 2 sprays into both nostrils daily.    ? HYDROcodone-acetaminophen (NORCO)  7.5-325 MG tablet Take 1 tablet by mouth 2 (two) times daily. 15 tablet 0  ? latanoprost (XALATAN) 0.005 % ophthalmic solution Place 1 drop into both eyes at bedtime.    ? letrozole (FEMARA) 2.5 MG tablet Take 1 tablet (2.5 mg total) by mouth daily. 90 tablet 3  ?  metFORMIN (GLUCOPHAGE) 500 MG tablet Take 1,000 mg by mouth 2 (two) times daily.    ? metoprolol tartrate (LOPRESSOR) 25 MG tablet Take 25 mg by mouth daily.    ? Multiple Vitamin (DAILY-VITE MULTIVITAMIN) TABS Take 1 tablet by mouth daily.    ? naproxen (NAPROSYN) 500 MG tablet Take 500 mg by mouth 2 (two) times daily.    ? pantoprazole (PROTONIX) 40 MG tablet Take 1 tablet (40 mg total) by mouth daily. 30 tablet 1  ? polyethylene glycol (MIRALAX / GLYCOLAX) 17 g packet Take 17 g by mouth daily. 14 each 0  ? pregabalin (LYRICA) 100 MG capsule Take 1 capsule (100 mg total) by mouth 3 (three) times daily. 5 capsule 0  ? senna-docusate (SENOKOT-S) 8.6-50 MG tablet Take 2 tablets by mouth 2 (two) times daily.    ? sertraline (ZOLOFT) 100 MG tablet Take 100 mg by mouth daily.    ? sodium chloride (OCEAN) 0.65 % SOLN nasal spray Place 1 spray into both nostrils as needed for congestion.    ? tolterodine (DETROL LA) 4 MG 24 hr capsule Take 4 mg by mouth daily.    ? alum & mag hydroxide-simeth (MAALOX/MYLANTA) 200-200-20 MG/5ML suspension Take 30 mLs by mouth every 6 (six) hours as needed for indigestion or heartburn.    ? glipiZIDE (GLUCOTROL XL) 5 MG 24 hr tablet Take 5 mg by mouth daily.    ? lisinopril (ZESTRIL) 10 MG tablet Take 10 mg by mouth daily. (Patient not taking: Reported on 03/11/2022)    ? metoprolol tartrate (LOPRESSOR) 50 MG tablet Take 1 tablet (50 mg total) by mouth 2 (two) times daily. (Patient not taking: Reported on 03/11/2022)    ? OXYGEN Inhale 2 L into the lungs at bedtime.    ? silver sulfADIAZINE (SILVADENE) 1 % cream Apply 1 application topically daily. (Patient not taking: Reported on 03/10/2022) 50 g 0  ? ? ? ?Discharge  Medications: ?Please see discharge summary for a list of discharge medications. ? ?Relevant Imaging Results: ? ?Relevant Lab Results: ? ? ?Additional Information ?SS #: [phone] ? ?Shelbie Hutching, RN ? ? ? ? ?

## 2022-03-12 NOTE — ED Notes (Signed)
PT at bedside.

## 2022-03-12 NOTE — TOC Transition Note (Signed)
Transition of Thornton (TOC) - CM/SW Discharge Note ? ? ?Patient Details  ?Name: Rhonda Thornton Thornton ?MRN: [id number] ?Date of Birth: 1951-07-05 ? ?Transition of Thornton (TOC) CM/SW Contact:  ?Rhonda Thornton Hutching, RN ?Phone Number: ?03/12/2022, 1:13 PM ? ? ?Clinical Narrative:    ?Rhonda Thornton Thornton could accept patient back but she would have to pay one week of copays up front.  Patient reports that she does not have the money for the copay.  Rhonda Thornton Thornton is not in network with patient's insurance and there are no other bed offers.  Patient asks if she can go home.  Patient reports that she feels safe going home.  MD will discharge her from emergency room. Home health orders in for RN, PT, OT, and SW.  Open with [facility].  Rhonda Thornton Thornton is aware of discharge today.   ? ?Patient's brother and sister in law will pick her up today.  She does say she could use a 3 in 1 at home and she is okay with having it delivered to her home.  Rhonda Thornton with [employer] given referral for 3 in 1.  She has a walker at home.   ? ?Patient seems excited about going home.   ? ? ?Final next level of Thornton: [place] ?Barriers to Discharge: Barriers Resolved ? ? ?Patient Goals and CMS Choice ?Patient states their goals for this hospitalization and ongoing recovery are:: patient agrees to go home ?CMS Medicare.gov Compare Post Acute Thornton list provided to:: Patient ?Choice offered to / list presented to : Patient ? ?Discharge Placement ?  ?           ?  ?  ?  ?  ? ?Discharge Plan and Services ?  ?  ?Post Acute Thornton Choice: Home Health (Active with [facility])          ?DME Arranged: 3-N-1 ?DME Agency: [employer] ?Date DME Agency Contacted: 03/12/22 ?Time DME Agency Contacted: [phone] ?Representative spoke with at DME Agency: Suanne Marker ?HH Arranged: RN, PT, OT, Social Work ?[place] Agency: [facility] ?Date HH Agency Contacted: 03/12/22 ?Time [place]: [phone] ?Representative spoke with at [place]: Rhonda Thornton Thornton ? ?Social Determinants  of Health (SDOH) Interventions ?  ? ? ?Readmission Risk Interventions ? ?  02/15/2022  ?  4:13 PM  ?Readmission Risk Prevention Plan  ?Transportation Screening Complete  ?PCP or Specialist Appt within 5-7 Days Complete  ?Home Thornton Screening Complete  ?Medication Review (RN CM) Complete  ? ? ? ? ? ?

## 2022-03-12 NOTE — ED Notes (Signed)
Pt sitting on the side of the bed, respirations equal and unlabored at this time.  ?

## 2022-03-12 NOTE — ED Notes (Signed)
Pt OOB to bedside toilet with walker, pt maintained steady gait.  ?

## 2022-03-12 NOTE — ED Notes (Signed)
D/C and reasons to return to ED discussed with pt, pt verbalized understanding. NAD noted on D/C.  ?

## 2022-03-13 ENCOUNTER — Ambulatory Visit: Admit: 2022-03-13 | Payer: Medicare HMO | Admitting: Orthopedic Surgery

## 2022-03-13 DIAGNOSIS — D649 Anemia, unspecified: Secondary | ICD-10-CM | POA: Insufficient documentation

## 2022-03-13 SURGERY — ARTHROPLASTY, KNEE, TOTAL
Anesthesia: Choice | Site: Knee | Laterality: Left

## 2022-03-30 IMAGING — CR DG CHEST 2V
1 series · 2 of 2 positions shown · non-contrast
Comparison: Previous studies including the examination of
02/08/2022

CLINICAL DATA: Weakness and swelling in the lower extremities

EXAM:
CHEST - 2 VIEW

[Series 1: dg chest 2 view · 0.14mm/px · 2 of 2 slices shown]
[im 1/2]
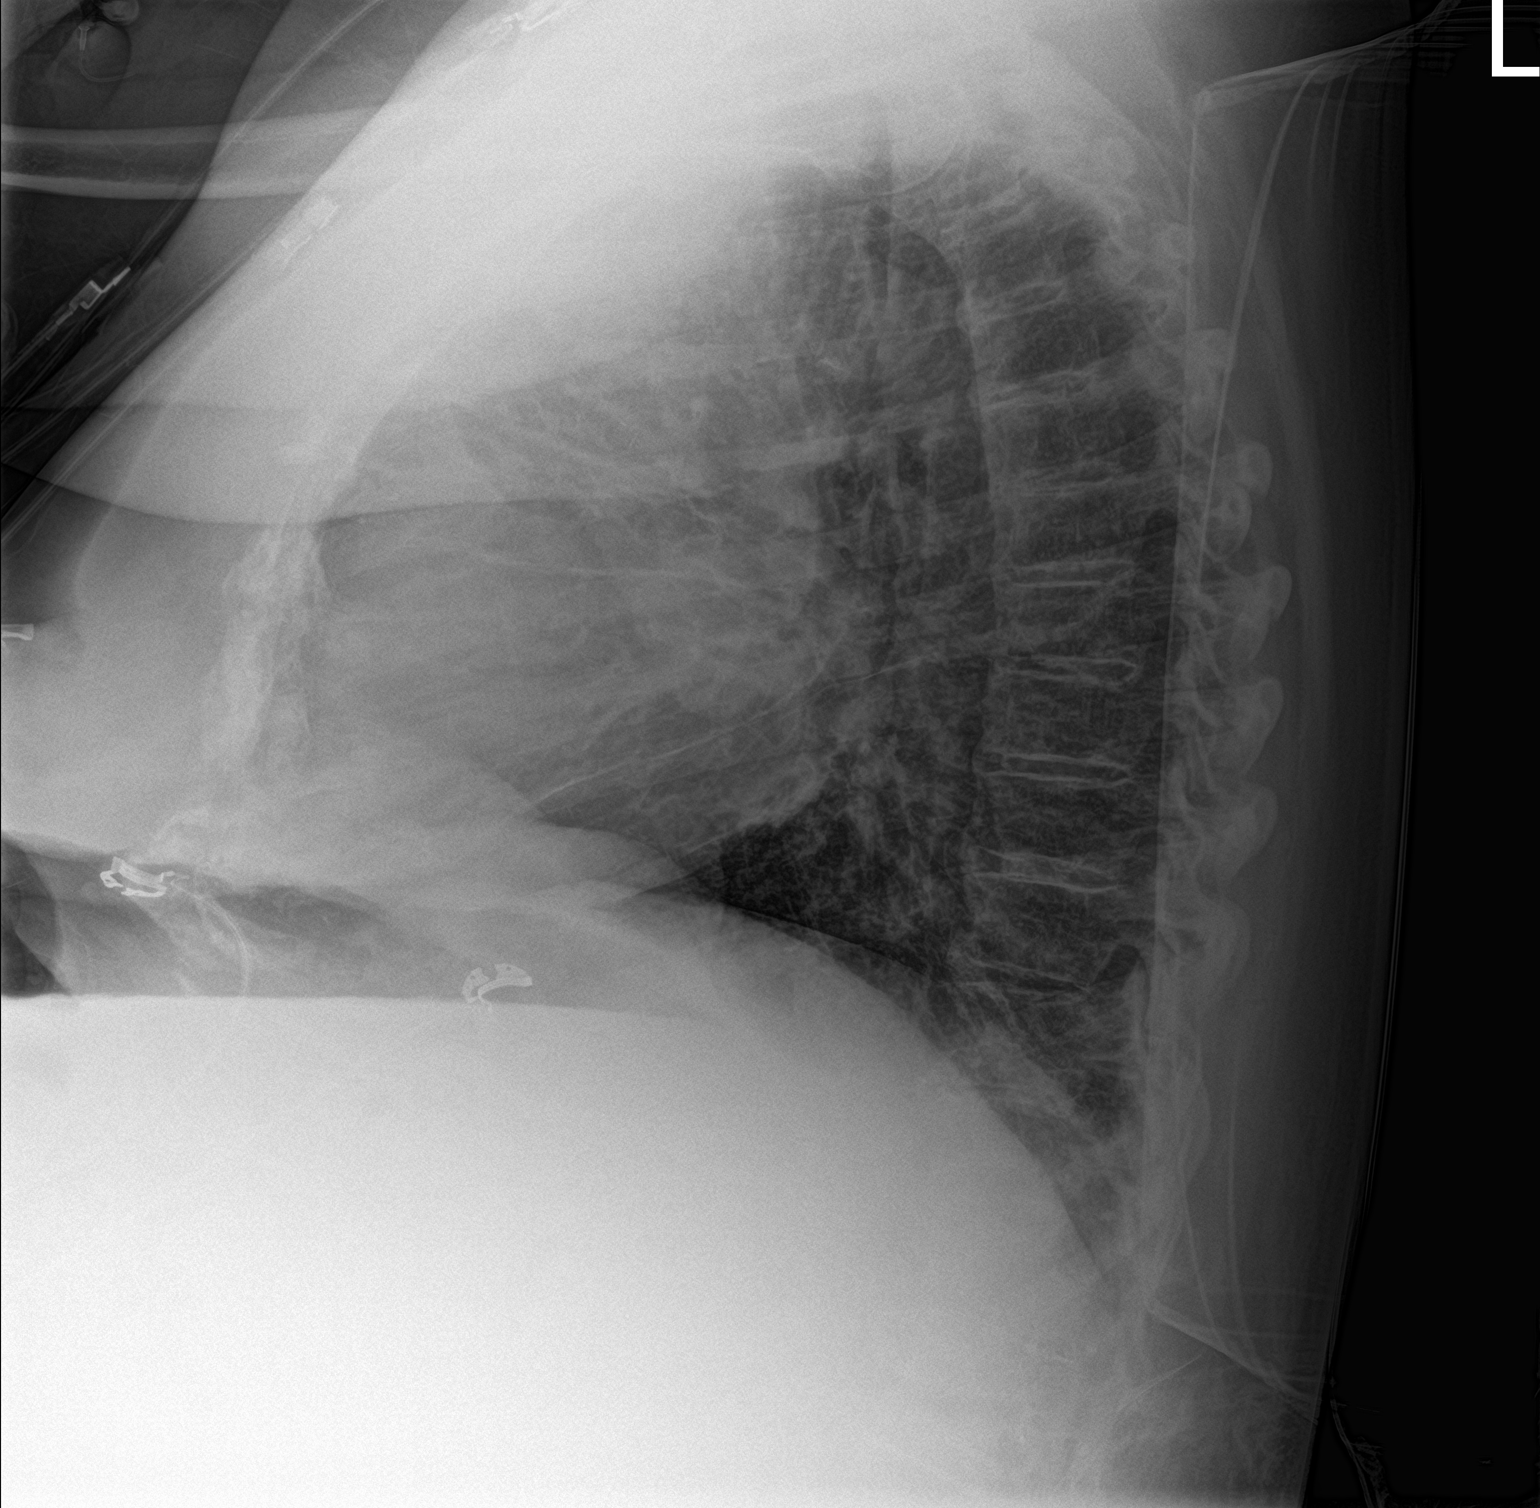
[im 2/2]
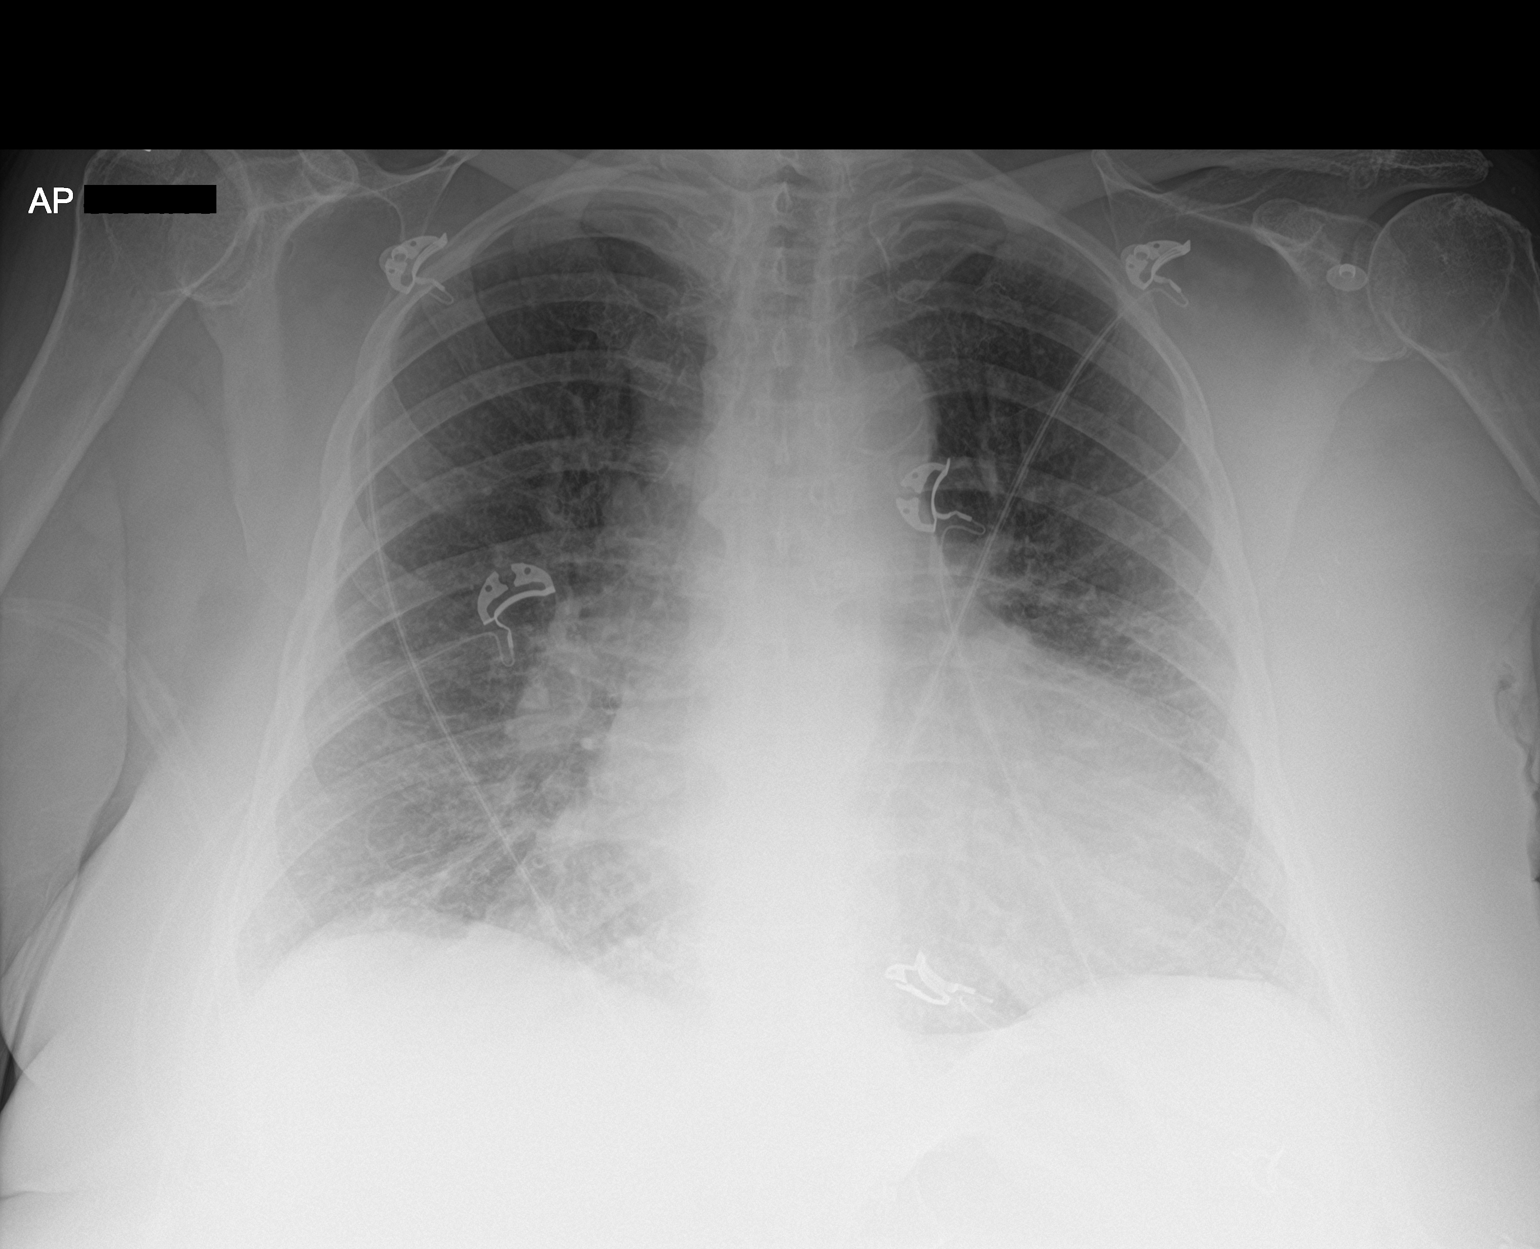

[2 of 2 positions shown; findings below may reference images not displayed]

FINDINGS: Transverse diameter of heart is increased. There is interval
decrease in pulmonary vascular congestion. There are no new focal
infiltrates. There is no pleural effusion or pneumothorax.
IMPRESSION: Cardiomegaly. There is interval decrease in pulmonary vascular
congestion. There are no signs of alveolar pulmonary edema or new
focal infiltrates.

## 2022-04-15 ENCOUNTER — Other Ambulatory Visit: Payer: Self-pay | Admitting: Orthopedic Surgery

## 2022-04-29 ENCOUNTER — Inpatient Hospital Stay: Payer: Medicare HMO | Admitting: Oncology

## 2022-05-06 ENCOUNTER — Encounter: Payer: Medicare HMO | Attending: Physician Assistant | Admitting: Physician Assistant

## 2022-05-06 DIAGNOSIS — L24A2 Irritant contact dermatitis due to fecal, urinary or dual incontinence: Secondary | ICD-10-CM | POA: Diagnosis not present

## 2022-05-06 DIAGNOSIS — I5042 Chronic combined systolic (congestive) and diastolic (congestive) heart failure: Secondary | ICD-10-CM | POA: Insufficient documentation

## 2022-05-06 DIAGNOSIS — I110 Hypertensive heart disease with heart failure: Secondary | ICD-10-CM | POA: Diagnosis not present

## 2022-05-06 DIAGNOSIS — Z7901 Long term (current) use of anticoagulants: Secondary | ICD-10-CM | POA: Insufficient documentation

## 2022-05-06 DIAGNOSIS — I480 Paroxysmal atrial fibrillation: Secondary | ICD-10-CM | POA: Insufficient documentation

## 2022-05-06 DIAGNOSIS — Z9981 Dependence on supplemental oxygen: Secondary | ICD-10-CM | POA: Diagnosis not present

## 2022-05-06 DIAGNOSIS — M6284 Sarcopenia: Secondary | ICD-10-CM | POA: Diagnosis not present

## 2022-05-06 DIAGNOSIS — Y838 Other surgical procedures as the cause of abnormal reaction of the patient, or of later complication, without mention of misadventure at the time of the procedure: Secondary | ICD-10-CM | POA: Diagnosis not present

## 2022-05-06 DIAGNOSIS — T8131XA Disruption of external operation (surgical) wound, not elsewhere classified, initial encounter: Secondary | ICD-10-CM | POA: Diagnosis present

## 2022-05-06 DIAGNOSIS — E11622 Type 2 diabetes mellitus with other skin ulcer: Secondary | ICD-10-CM | POA: Insufficient documentation

## 2022-05-06 DIAGNOSIS — L98492 Non-pressure chronic ulcer of skin of other sites with fat layer exposed: Secondary | ICD-10-CM | POA: Diagnosis not present

## 2022-05-06 NOTE — Progress Notes (Signed)
Rhonda Thornton, Rhonda C. ([id number]) ?Visit Report for 05/06/2022 ?Allergy List Details ?Patient Name: Rhonda Thornton, Rhonda Thornton ?Date of Service: 05/06/2022 10:00 AM ?Medical Record Number: [id number] ?Patient Account Number: [id number] ?Date of Birth/Sex: [birth year] (71 y.o. F) ?Treating RN: Donnamarie Poag ?Primary Care Jonavin Seder: Denton Lank Other Clinician: ?Referring Nayzeth Altman: Denton Lank ?Treating Nkosi Cortright/Extender: Jeri Cos ?Weeks in Treatment: 0 ?Allergies ?Active Allergies ?No Known Allergies ?Allergy Notes ?Electronic Signature(s) ?Signed: 05/06/2022 4:34:09 PM By: Donnamarie Poag ?Entered ByDonnamarie Poag on 05/06/2022 10:17:10 ?[place]away, Vitoria C. ([id number]) ?-------------------------------------------------------------------------------- ?Arrival Information Details ?Patient Name: Rhonda Thornton, Rhonda Thornton ?Date of Service: 05/06/2022 10:00 AM ?Medical Record Number: [id number] ?Patient Account Number: [id number] ?Date of Birth/Sex: 1951-04-22 (71 y.o. F) ?Treating RN: Donnamarie Poag ?Primary Care Alanzo Lamb: Denton Lank Other Clinician: ?Referring Roseland Braun: Denton Lank ?Treating Remee Charley/Extender: Jeri Cos ?Weeks in Treatment: 0 ?Visit Information ?Patient Arrived: Wheel Chair ?Arrival Time: 10:11 ?Accompanied By: self ?Transfer Assistance: EasyPivot Patient Lift ?Patient Identification Verified: Yes ?Secondary Verification Process Completed: Yes ?Patient Requires Transmission-Based No ?Precautions: ?Patient Has Alerts: Yes ?Patient Alerts: Patient on Blood ?Thinner ?DIABETIC ?ELIQUIS ?[facility] ?Electronic Signature(s) ?Signed: 05/06/2022 4:34:09 PM By: Donnamarie Poag ?Entered ByDonnamarie Poag on 05/06/2022 10:48:54 ?Rhonda Thornton, Rhonda C. ([id number]) ?-------------------------------------------------------------------------------- ?Clinic Level of Care Assessment Details ?Patient Name: Rhonda Thornton, Rhonda Thornton ?Date of Service: 05/06/2022 10:00 AM ?Medical Record Number: [id number] ?Patient Account Number: [id number] ?Date of Birth/Sex: Feb 04, 1951 (71 y.o.  F) ?Treating RN: Donnamarie Poag ?Primary Care Marque Bango: Denton Lank Other Clinician: ?Referring Gypsy Kellogg: Denton Lank ?Treating Letrell Attwood/Extender: Jeri Cos ?Weeks in Treatment: 0 ?Clinic Level of Care Assessment Items ?TOOL 2 Quantity Score ?'[]'$  - Use when only an EandM is performed on the INITIAL visit 0 ?ASSESSMENTS - Nursing Assessment / Reassessment ?'[]'$  - General Physical Exam (combine w/ comprehensive assessment (listed just below) when performed on new ?0 ?pt. evals) ?'[]'$  - 0 ?Comprehensive Assessment (HX, ROS, Risk Assessments, Wounds Hx, etc.) ?ASSESSMENTS - Wound and Skin Assessment / Reassessment ?'[]'$  - Simple Wound Assessment / Reassessment - one wound 0 ?X- 3 5 ?Complex Wound Assessment / Reassessment - multiple wounds ?'[]'$  - 0 ?Dermatologic / Skin Assessment (not related to wound area) ?ASSESSMENTS - Ostomy and/or Continence Assessment and Care ?'[]'$  - Incontinence Assessment and Management 0 ?'[]'$  - 0 ?Ostomy Care Assessment and Management (repouching, etc.) ?PROCESS - Coordination of Care ?X - Simple Patient / Family Education for ongoing care 1 15 ?'[]'$  - 0 ?Complex (extensive) Patient / Family Education for ongoing care ?X- 1 10 ?Staff obtains Consents, Records, Test Results / Process Orders ?X- 1 10 ?Staff telephones HHA, Nursing Homes / Clarify orders / etc ?'[]'$  - 0 ?Routine Transfer to another Facility (non-emergent condition) ?'[]'$  - 0 ?Routine Hospital Admission (non-emergent condition) ?X- 1 15 ?New Admissions / Biomedical engineer / Ordering NPWT, Apligraf, etc. ?'[]'$  - 0 ?Emergency Hospital Admission (emergent condition) ?X- 1 10 ?Simple Discharge Coordination ?'[]'$  - 0 ?Complex (extensive) Discharge Coordination ?PROCESS - Special Needs ?'[]'$  - Pediatric / Minor Patient Management 0 ?'[]'$  - 0 ?Isolation Patient Management ?'[]'$  - 0 ?Hearing / Language / Visual special needs ?'[]'$  - 0 ?Assessment of Community assistance (transportation, D/C planning, etc.) ?'[]'$  - 0 ?Additional assistance / Altered  mentation ?'[]'$  - 0 ?Support Surface(s) Assessment (bed, cushion, seat, etc.) ?INTERVENTIONS - Wound Cleansing / Measurement ?X - Wound Imaging (photographs - any number of wounds) 1 5 ?'[]'$  - 0 ?Wound Tracing (instead of photographs) ?'[]'$  - 0 ?Simple Wound Measurement - one wound ?X- 3 5 ?Complex Wound Measurement - multiple wounds ?  Rhonda Thornton, Rhonda C. ([id number]) ?'[]'$  - 0 ?Simple Wound Cleansing - one wound ?X- 3 5 ?Complex Wound Cleansing - multiple wounds ?INTERVENTIONS - Wound Dressings ?X - Small Wound Dressing one or multiple wounds 3 10 ?'[]'$  - 0 ?Medium Wound Dressing one or multiple wounds ?'[]'$  - 0 ?Large Wound Dressing one or multiple wounds ?'[]'$  - 0 ?Application of Medications - injection ?INTERVENTIONS - Miscellaneous ?'[]'$  - External ear exam 0 ?'[]'$  - 0 ?Specimen Collection (cultures, biopsies, blood, body fluids, etc.) ?'[]'$  - 0 ?Specimen(s) / Culture(s) sent or taken to Lab for analysis ?'[]'$  - 0 ?Patient Transfer (multiple staff / Civil Service fast streamer / Similar devices) ?'[]'$  - 0 ?Simple Staple / Suture removal (25 or less) ?'[]'$  - 0 ?Complex Staple / Suture removal (26 or more) ?'[]'$  - 0 ?Hypo / Hyperglycemic Management (close monitor of Blood Glucose) ?'[]'$  - 0 ?Ankle / Brachial Index (ABI) - do not check if billed separately ?Has the patient been seen at the hospital within the last three years: Yes ?Total Score: 140 ?Level Of Care: New/Established - Level ?4 ?Electronic Signature(s) ?Signed: 05/06/2022 4:34:09 PM By: Donnamarie Poag ?Entered ByDonnamarie Poag on 05/06/2022 11:05:40 ?Rhonda Thornton, Rhonda C. ([id number]) ?-------------------------------------------------------------------------------- ?Encounter Discharge Information Details ?Patient Name: Rhonda Thornton, Rhonda Thornton ?Date of Service: 05/06/2022 10:00 AM ?Medical Record Number: [id number] ?Patient Account Number: [id number] ?Date of Birth/Sex: June 27, 1951 (71 y.o. F) ?Treating RN: Donnamarie Poag ?Primary Care Kymari Lollis: Denton Lank Other Clinician: ?Referring Dilyn Osoria: Denton Lank ?Treating  Shaiden Aldous/Extender: Jeri Cos ?Weeks in Treatment: 0 ?Encounter Discharge Information Items ?Discharge Condition: Stable ?Ambulatory Status: Wheelchair ?Discharge Destination: Home ?Transportation: Other ?Accompanied By: self ?Schedule Follow-up Appointment: Yes ?Clinical Summary of Care: ?Electronic Signature(s) ?Signed: 05/06/2022 4:34:09 PM By: Donnamarie Poag ?Entered ByDonnamarie Poag on 05/06/2022 11:28:13 ?Rhonda Thornton, Rhonda C. ([id number]) ?-------------------------------------------------------------------------------- ?Lower Extremity Assessment Details ?Patient Name: Rhonda Thornton, Rhonda Thornton ?Date of Service: 05/06/2022 10:00 AM ?Medical Record Number: [id number] ?Patient Account Number: [id number] ?Date of Birth/Sex: [birth year] (71 y.o. F) ?Treating RN: Donnamarie Poag ?Primary Care Harlee Pursifull: Denton Lank Other Clinician: ?Referring Aahil Fredin: Denton Lank ?Treating Riham Polyakov/Extender: Jeri Cos ?Weeks in Treatment: 0 ?Electronic Signature(s) ?Signed: 05/06/2022 4:34:09 PM By: Donnamarie Poag ?Entered ByDonnamarie Poag on 05/06/2022 10:24:39 ?Rhonda Thornton, Rhonda C. ([id number]) ?-------------------------------------------------------------------------------- ?Multi Wound Chart Details ?Patient Name: Rhonda Thornton, Rhonda Thornton ?Date of Service: 05/06/2022 10:00 AM ?Medical Record Number: [id number] ?Patient Account Number: [id number] ?Date of Birth/Sex: 1951/07/27 (71 y.o. 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

## 2022-05-06 NOTE — Progress Notes (Signed)
Cafiero, Caelin C. ([id number]) ?Visit Report for 05/06/2022 ?Chief Complaint Document Details ?Patient Name: Rhonda Thornton, Rhonda Thornton ?Date of Service: 05/06/2022 10:00 AM ?Medical Record Number: [id number] ?Patient Account Number: [id number] ?Date of Birth/Sex: [birth year] (71 y.o. F) ?Treating RN: Donnamarie Poag ?Primary Care Provider: Denton Lank Other Clinician: ?Referring Provider: Denton Lank ?Treating Provider/Extender: Jeri Cos ?Weeks in Treatment: 0 ?Information Obtained from: Patient ?Chief Complaint ?Left breast and bilateral gluteal ulcers ?Electronic Signature(s) ?Signed: 05/06/2022 11:01:29 AM By: Worthy Keeler PA-C ?Entered By: Worthy Keeler on 05/06/2022 11:01:29 ?Nicoll, Tezra C. ([id number]) ?-------------------------------------------------------------------------------- ?HPI Details ?Patient Name: Rhonda Thornton ?Date of Service: 05/06/2022 10:00 AM ?Medical Record Number: [id number] ?Patient Account Number: [id number] ?Date of Birth/Sex: [birth year] (70 y.o. F) ?Treating RN: Donnamarie Poag ?Primary Care Provider: Denton Lank Other Clinician: ?Referring Provider: Denton Lank ?Treating Provider/Extender: Jeri Cos ?Weeks in Treatment: 0 ?History of Present Illness ?HPI Description: 05-06-2022 upon evaluation today patient appears to be doing somewhat poorly in regard to wound she has not 2 locations 1 in ?the left breast which is a previous site from August 2022 of radiation therapy following a lumpectomy. Subsequently she seemed to have done ?well according to what the patient tells me today. In the end though she unfortunately ended up with an abscess more recent. August 2002 was ?when she ended with the radiation and she had surgery at [facility] due to an abscess in the left breast on 03-30-2022 Easter Sunday of this year. ?Subsequently the patient does have atrial fibrillation for which she is on Eliquis. She also tells me that she is having some discomfort in regard to ?the breast but its not too terrible. Other  than this she also has some irritant contact dermatitis due to urinary incontinence noted over the gluteal ?area. There may be some component of pressure but I think primarily this is yeast in urine related. Unfortunately the incontinence is something ?she has been dealing with for quite some time. She had a DuoDERM on this area that is definitely not can to be the best way to go at this point. 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 ?Electronic Signature(s) ?Signed: 05/06/2022 12:53:10 PM By: Worthy Keeler PA-C ?Entered By: Worthy Keeler on 05/06/2022 12:53:10 ?Portal, Jewel C. ([id number]) ?-------------------------------------------------------------------------------- ?Physical Exam Details ?Patient Name: Rhonda Thornton ?Date of Service: 05/06/2022 10:00 AM ?Medical Record Number: [id number] ?Patient Account Number: [id number] ?Date of Birth/Sex: 23-Apr-1951 (71 y.o. F) ?Treating RN: Donnamarie Poag ?Primary Care Provider: Denton Lank Other Clinician: ?Referring Provider: Denton Lank ?Treating Provider/Extender: Jeri Cos ?Weeks in Treatment: 0 ?Constitutional ?sitting or standing blood pressure is within target range for patient.. pulse regular and within target range for patient.Marland Kitchen respirations regular, non- ?labored and within target range for patient.Marland Kitchen temperature within target range for patient.. Well-nourished and well-hydrated in no acute distress. ?Eyes ?conjunctiva clear no eyelid edema noted. pupils equal round and reactive to light and accommodation. ?Ears, Nose, Mouth, and Throat ?no gross abnormality of ear auricles or external auditory canals. normal hearing noted during conversation. mucus membranes moist. ?Respiratory ?normal breathing without difficulty. ?Musculoskeletal ?normal gait and posture. no significant deformity or  arthritic changes, no loss or range of motion, no clubbing. ?Psychiatric ?this patient is able to make decisions and demonstrates good insight into disease process. Alert and Oriented x 3. pleasant and cooperative. ?Notes ?Upon inspection patient's wound bed actually showed  signs of good granulation in that physician at this point. Fortunately I do not see any signs ?of active infection locally or systemically which is great news and overall I am extremely pleased with where we stand as far as that is concerned. 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 The best bet is probably for [place] to continue with the packing using iodoform packing gauze. I really do not ?think this can be a better option I considered Hydrofera Blue but again I think that skin to be much more difficult way to pack at this point I do ?not think that skin to be terribly effective. Nonetheless in the end I opted to go back with the packing strip which I do believe is going to be best. ?Unfortunately the patient does state that she is claustrophobic we discussed HBO therapy she will want to look at everything if she was interested ?in trying to do what ever she could get this healed but after looking at the chamber she states that she is severely claustrophobic and there is no ?way she is going to be able to do that. She therefore opted to not proceed with authorization and approval for HBO therapy this is the patient's ?choice obviously. ?Electronic Signature(s) ?Signed: 05/06/2022 12:54:48 PM By: Worthy Keeler PA-C ?Entered By: Worthy Keeler on 05/06/2022 12:54:48 ?Herskowitz, Zaryiah C. ([id number]) ?-------------------------------------------------------------------------------- ?Physician Orders Details ?Patient Name: Rhonda Thornton ?Date of Service: 05/06/2022 10:00 AM ?Medical Record Number:  [id number] ?Patient Account Number: [id number] ?Date of Birth/Sex: 1951-11-13 (71 y.o. F) ?Treating RN: Donnamarie Poag ?Primary Care Provider: Denton Lank Other Clinician: ?Referring Provider: Denton Lank ?Treating Provider/Extender: Jeri Cos ?Weeks in Treatment: 0 ?Verbal / Phone Orders: No ?Diagnosis Coding ?Follow-up Appointments ?o Return Appointment in 2 weeks. ?o Nurse Visit as needed ?Home Health ?[facility] already handling pt-please try to see pt 3 x week ?o [place] for wound care. May utilize formulary equivalent dressing for wound treatment orders unless ?otherwise specified. Home Health Nurse may visit PRN to address patientos wound care needs. - See new orders for left breast ?and bilateral gluts ?o Scheduled days for dressing changes to be completed; exception, patient has scheduled wound care visit that day. ?o **Please direct any NON-WOUND related issues/requests for orders to patient's Primary Care Physician. **If current ?dressing causes regression in wound condition, may D/C ordered dressing product/s and apply Normal Saline Moist ?Dressing daily until next [place] or Other MD appointment. **Notify [facility] of regression in ?wound condition at [phone]. ?Bathing/ Shower/ Hygiene ?o Wash wounds with antibacterial soap and water. ?Anesthetic (Use 'Patient Medications' Section for Anesthetic Order Entry) ?o Lidocaine applied to wound bed ?Edema Control - Lymphedema / Segmental Compressive Device / Other ?o DO YOUR BEST to sleep in the bed at night. DO NOT sleep in your recliner. Long hours of sitting in a recliner leads to ?swelling of the legs and/or potential wounds on your backside. ?Off-Loading ?o Gel wheelchair cushion - Recommend ?o Gel mattress overlay (Group 1) - Recommend ?o Turn and reposition every 2 hours ?Additional Orders / Instructions ?o Follow Nutritious Diet and Increase Protein  Intake ?Hyperbaric Oxygen Therapy ?o Evaluate for HBO Therapy - left breast ?Wound Treatment ?Wound #1 - Breast Wound Laterality: Left ?Cleanser: Normal Saline 3 x Per Week/30  Days ?Discharge Instructions: Wash your hands with soap a

## 2022-05-06 NOTE — Progress Notes (Signed)
Woulfe, Helen C. ([id number]) ?Visit Report for 05/06/2022 ?Abuse Risk Screen Details ?Patient Name: Rhonda Thornton, Rhonda Thornton ?Date of Service: 05/06/2022 10:00 AM ?Medical Record Number: [id number] ?Patient Account Number: [id number] ?Date of Birth/Sex: 1951-06-11 (71 y.o. F) ?Treating RN: Donnamarie Poag ?Primary Care Awad Gladd: Denton Lank Other Clinician: ?Referring Ayrabella Labombard: Denton Lank ?Treating Shawnee Gambone/Extender: Jeri Cos ?Weeks in Treatment: 0 ?Abuse Risk Screen Items ?Answer ?ABUSE RISK SCREEN: ?Has anyone close to you tried to hurt or harm you recentlyo No ?Do you feel uncomfortable with anyone in your familyo No ?Has anyone forced you do things that you didnot want to doo No ?Electronic Signature(s) ?Signed: 05/06/2022 4:34:09 PM By: Donnamarie Poag ?Entered ByDonnamarie Poag on 05/06/2022 10:19:29 ?Henard, Kyree C. ([id number]) ?-------------------------------------------------------------------------------- ?Activities of Daily Living Details ?Patient Name: Rhonda Thornton, Rhonda Thornton ?Date of Service: 05/06/2022 10:00 AM ?Medical Record Number: [id number] ?Patient Account Number: [id number] ?Date of Birth/Sex: 10-04-1951 (71 y.o. F) ?Treating RN: Donnamarie Poag ?Primary Care Aliou Mealey: Denton Lank Other Clinician: ?Referring Cody Oliger: Denton Lank ?Treating Galileo Colello/Extender: Jeri Cos ?Weeks in Treatment: 0 ?Activities of Daily Living Items ?Answer ?Activities of Daily Living (Please select one for each item) ?Drive Automobile Not Able ?Take Medications Completely Able ?Use Telephone Completely Able ?Care for Appearance Completely Able ?Use Toilet Completely Able ?Bath / Shower Need Assistance ?Dress Self Completely Able ?Feed Self Completely Able ?Walk Need Assistance ?Get In / Out Bed Completely Able ?Housework Need Assistance ?Prepare Meals Need Assistance ?Handle Money Completely Able ?Shop for Self Need Assistance ?Electronic Signature(s) ?Signed: 05/06/2022 4:34:09 PM By: Donnamarie Poag ?Entered ByDonnamarie Poag on 05/06/2022  10:17:53 ?Rigor, Amori C. ([id number]) ?-------------------------------------------------------------------------------- ?Education Screening Details ?Patient Name: Rhonda Thornton, Rhonda Thornton ?Date of Service: 05/06/2022 10:00 AM ?Medical Record Number: [id number] ?Patient Account Number: [id number] ?Date of Birth/Sex: [birth year] (71 y.o. F) ?Treating RN: Donnamarie Poag ?Primary Care Gita Dilger: Denton Lank Other Clinician: ?Referring Angelmarie Ponzo: Denton Lank ?Treating Woodie Degraffenreid/Extender: Jeri Cos ?Weeks in Treatment: 0 ?Primary Learner Assessed: Patient ?Learning Preferences/Education Level/Primary Language ?Learning Preference: Explanation, Demonstration ?Highest Education Level: High School ?Preferred Language: English ?Cognitive Barrier ?Language Barrier: No ?Translator Needed: No ?Memory Deficit: No ?Emotional Barrier: No ?Cultural/Religious Beliefs Affecting Medical Care: No ?Physical Barrier ?Impaired Vision: No ?Impaired Hearing: No ?Decreased Hand dexterity: No ?Knowledge/Comprehension ?Knowledge Level: Medium ?Comprehension Level: Medium ?Ability to understand written instructions: Medium ?Ability to understand verbal instructions: Medium ?Motivation ?Anxiety Level: Calm ?Cooperation: Cooperative ?Education Importance: Acknowledges Need ?Interest in Health Problems: Asks Questions ?Perception: Coherent ?Willingness to Engage in Self-Management ?High ?Activities: ?Readiness to Engage in Self-Management ?High ?Activities: ?Electronic Signature(s) ?Signed: 05/06/2022 4:34:09 PM By: Donnamarie Poag ?Entered ByDonnamarie Poag on 05/06/2022 10:18:18 ?Yonts, Julanne C. ([id number]) ?-------------------------------------------------------------------------------- ?Fall Risk Assessment Details ?Patient Name: Rhonda Thornton, Rhonda Thornton ?Date of Service: 05/06/2022 10:00 AM ?Medical Record Number: [id number] ?Patient Account Number: [id number] ?Date of Birth/Sex: 06/22/1951 (71 y.o. F) ?Treating RN: Donnamarie Poag ?Primary Care Carnell Beavers: Denton Lank Other  Clinician: ?Referring Darlyn Repsher: Denton Lank ?Treating Jahn Franchini/Extender: Jeri Cos ?Weeks in Treatment: 0 ?Fall Risk Assessment Items ?Have you had 2 or more falls in the last 12 monthso 0 Yes ?Have you had any fall that resulted in injury in the last 12 monthso 0 No ?FALLS RISK SCREEN ?History of falling - immediate or within 3 months 25 Yes ?Secondary diagnosis (Do you have 2 or more medical diagnoseso) 15 Yes ?Ambulatory aid ?None/bed rest/wheelchair/nurse 0 No ?Crutches/cane/walker 15 Yes ?Furniture 0 No ?Intravenous therapy Access/Saline/Heparin Lock 0 No ?Gait/Transferring ?Normal/ bed rest/ wheelchair 0 Yes ?Weak (short steps with or without shuffle,  stooped but able to lift head while walking, may ?10 Yes ?seek support from furniture) ?Impaired (short steps with shuffle, may have difficulty arising from chair, head down, impaired ?0 No ?balance) ?Mental Status ?Oriented to own ability 0 Yes ?Electronic Signature(s) ?Signed: 05/06/2022 4:34:09 PM By: Donnamarie Poag ?Entered ByDonnamarie Poag on 05/06/2022 10:18:45 ?Lingard, Kirah C. ([id number]) ?-------------------------------------------------------------------------------- ?Foot Assessment Details ?Patient Name: Rhonda Thornton, Rhonda Thornton ?Date of Service: 05/06/2022 10:00 AM ?Medical Record Number: [id number] ?Patient Account Number: [id number] ?Date of Birth/Sex: Dec 17, 1951 (71 y.o. F) ?Treating RN: Donnamarie Poag ?Primary Care Chaylee Ehrsam: Denton Lank Other Clinician: ?Referring Micki Cassel: Denton Lank ?Treating Jenna Ardoin/Extender: Jeri Cos ?Weeks in Treatment: 0 ?Foot Assessment Items ?Site Locations ?+ = Sensation present, - = Sensation absent, C = Callus, U = Ulcer ?R = Redness, W = Warmth, M = Maceration, PU = Pre-ulcerative lesion ?F = Fissure, S = Swelling, D = Dryness ?Assessment ?Right: Left: ?Other Deformity: No No ?Prior Foot Ulcer: No No ?Prior Amputation: No No ?Charcot Joint: No No ?Ambulatory Status: Ambulatory With Help ?Assistance Device:  Walker ?Gait: ?Electronic Signature(s) ?Signed: 05/06/2022 4:34:09 PM By: Donnamarie Poag ?Entered ByDonnamarie Poag on 05/06/2022 10:19:19 ?Gilkeson, Lucila C. ([id number]) ?-------------------------------------------------------------------------------- ?Nutrition Risk Screening Details ?Patient Name: Rhonda Thornton, Rhonda Thornton ?Date of Service: 05/06/2022 10:00 AM ?Medical Record Number: [id number] ?Patient Account Number: [id number] ?Date of Birth/Sex: 1951/05/08 (71 y.o. F) ?Treating RN: Donnamarie Poag ?Primary Care Yoltzin Barg: Denton Lank Other Clinician: ?Referring Tawney Vanorman: Denton Lank ?Treating Cully Luckow/Extender: Jeri Cos ?Weeks in Treatment: 0 ?Height (in): 64 ?Weight (lbs): 225 ?Body Mass Index (BMI): 38.6 ?Nutrition Risk Screening Items ?Score Screening ?NUTRITION RISK SCREEN: ?I have an illness or condition that made me change the kind and/or amount of food I eat 0 No ?I eat fewer than two meals per day 3 Yes ?I eat few fruits and vegetables, or milk products 2 Yes ?I have three or more drinks of beer, liquor or wine almost every day 0 No ?I have tooth or mouth problems that make it hard for me to eat 0 No ?I don't always have enough money to buy the food I need 4 Yes ?I eat alone most of the time 1 Yes ?I take three or more different prescribed or over-the-counter drugs a day 1 Yes ?Without wanting to, I have lost or gained 10 pounds in the last six months 0 No ?I am not always physically able to shop, cook and/or feed myself 2 Yes ?Nutrition Protocols ?Good Risk Protocol ?Moderate Risk Protocol 0 Provide education on nutrition ?High Risk Proctocol ?Risk Level: High Risk ?Score: 13 ?Electronic Signature(s) ?Signed: 05/06/2022 4:34:09 PM By: Donnamarie Poag ?Entered ByDonnamarie Poag on 05/06/2022 10:19:07 ?

## 2022-05-12 ENCOUNTER — Inpatient Hospital Stay: Admission: RE | Admit: 2022-05-12 | Payer: Medicare HMO | Source: Ambulatory Visit

## 2022-05-15 DIAGNOSIS — I350 Nonrheumatic aortic (valve) stenosis: Secondary | ICD-10-CM

## 2022-05-21 ENCOUNTER — Inpatient Hospital Stay: Payer: Medicare HMO | Admitting: Oncology

## 2022-05-23 ENCOUNTER — Ambulatory Visit: Payer: Medicare HMO | Admitting: Physician Assistant

## 2022-05-27 ENCOUNTER — Ambulatory Visit: Admit: 2022-05-27 | Payer: Medicare HMO | Admitting: Orthopedic Surgery

## 2022-05-27 SURGERY — ARTHROPLASTY, KNEE, TOTAL
Anesthesia: Choice | Site: Knee | Laterality: Left

## 2022-06-12 ENCOUNTER — Encounter: Payer: Medicare HMO | Attending: Physician Assistant | Admitting: Physician Assistant

## 2022-06-12 DIAGNOSIS — I480 Paroxysmal atrial fibrillation: Secondary | ICD-10-CM | POA: Insufficient documentation

## 2022-06-12 DIAGNOSIS — T8131XA Disruption of external operation (surgical) wound, not elsewhere classified, initial encounter: Secondary | ICD-10-CM | POA: Diagnosis present

## 2022-06-12 DIAGNOSIS — I110 Hypertensive heart disease with heart failure: Secondary | ICD-10-CM | POA: Diagnosis not present

## 2022-06-12 DIAGNOSIS — Y838 Other surgical procedures as the cause of abnormal reaction of the patient, or of later complication, without mention of misadventure at the time of the procedure: Secondary | ICD-10-CM | POA: Diagnosis not present

## 2022-06-12 DIAGNOSIS — L988 Other specified disorders of the skin and subcutaneous tissue: Secondary | ICD-10-CM | POA: Diagnosis not present

## 2022-06-12 DIAGNOSIS — L98492 Non-pressure chronic ulcer of skin of other sites with fat layer exposed: Secondary | ICD-10-CM | POA: Diagnosis not present

## 2022-06-12 DIAGNOSIS — Z923 Personal history of irradiation: Secondary | ICD-10-CM | POA: Insufficient documentation

## 2022-06-12 DIAGNOSIS — L24A2 Irritant contact dermatitis due to fecal, urinary or dual incontinence: Secondary | ICD-10-CM | POA: Diagnosis not present

## 2022-06-12 DIAGNOSIS — I5042 Chronic combined systolic (congestive) and diastolic (congestive) heart failure: Secondary | ICD-10-CM | POA: Diagnosis not present

## 2022-06-12 DIAGNOSIS — Z7901 Long term (current) use of anticoagulants: Secondary | ICD-10-CM | POA: Diagnosis not present

## 2022-06-12 DIAGNOSIS — E11622 Type 2 diabetes mellitus with other skin ulcer: Secondary | ICD-10-CM | POA: Diagnosis not present

## 2022-06-12 DIAGNOSIS — M6281 Muscle weakness (generalized): Secondary | ICD-10-CM | POA: Diagnosis not present

## 2022-06-12 DIAGNOSIS — Z9981 Dependence on supplemental oxygen: Secondary | ICD-10-CM | POA: Insufficient documentation
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
## 2022-06-12 NOTE — Progress Notes (Signed)
SHANIQWA, HORSMAN ([id number]) Visit Report for 06/12/2022 Chief Complaint Document Details Patient Name: Rhonda Thornton, Rhonda C. Date of Service: 06/12/2022 11:15 AM Medical Record Number: [id number] Patient Account Number: [id number] Date of Birth/Sex: 27-Oct-1951 (71 y.o. F) Treating RN: Cornell Barman Primary Care Provider: Denton Lank Other Clinician: Massie Kluver Referring Provider: Denton Lank Treating Provider/Extender: Skipper Cliche in Treatment: 5 Information Obtained from: Patient Chief Complaint Left breast and bilateral gluteal ulcers Electronic Signature(s) Signed: 06/12/2022 11:13:47 AM By: Worthy Keeler PA-C Entered By: Worthy Keeler on 06/12/2022 11:13:47 Rhonda Thornton, Rhonda C. ([id number]) -------------------------------------------------------------------------------- Problem List Details Patient Name: [name], [name] Date of Service: 06/12/2022 11:15 AM Medical Record Number: [id number] Patient Account Number: [id number] Date of Birth/Sex: [birth year] (71 y.o. F) Treating RN: Cornell Barman Primary Care Provider: Denton Lank Other Clinician: Massie Kluver Referring Provider: Denton Lank Treating Provider/Extender: Skipper Cliche in Treatment: 5 Active Problems ICD-10 Encounter Code Description Active Date MDM Diagnosis T81.31XA Disruption of external operation (surgical) wound, not elsewhere 05/06/2022 No Yes classified, initial encounter L98.492 Non-pressure chronic ulcer of skin of other sites with fat layer exposed 05/06/2022 No Yes L24.A2 Irritant contact dermatitis due to fecal, urinary or dual incontinence 05/06/2022 No Yes L98.8 Other specified disorders of the skin and subcutaneous tissue 05/06/2022 No Yes I48.0 Paroxysmal atrial fibrillation 05/06/2022 No Yes M62.81 Muscle weakness (generalized) 05/06/2022 No Yes E11.622 Type 2 diabetes mellitus with other skin ulcer 05/06/2022 No Yes Z79.01 Long term (current) use of anticoagulants 05/06/2022 No Yes 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 ([id number])

## 2022-07-01 ENCOUNTER — Ambulatory Visit: Payer: Medicare HMO | Admitting: Oncology

## 2022-07-03 ENCOUNTER — Encounter: Payer: Medicare HMO | Attending: Physician Assistant | Admitting: Physician Assistant

## 2022-07-03 DIAGNOSIS — L24A2 Irritant contact dermatitis due to fecal, urinary or dual incontinence: Secondary | ICD-10-CM | POA: Diagnosis not present

## 2022-07-03 DIAGNOSIS — L98492 Non-pressure chronic ulcer of skin of other sites with fat layer exposed: Secondary | ICD-10-CM | POA: Diagnosis not present

## 2022-07-03 DIAGNOSIS — Z9981 Dependence on supplemental oxygen: Secondary | ICD-10-CM | POA: Diagnosis not present

## 2022-07-03 DIAGNOSIS — M6281 Muscle weakness (generalized): Secondary | ICD-10-CM | POA: Insufficient documentation

## 2022-07-03 DIAGNOSIS — T8131XA Disruption of external operation (surgical) wound, not elsewhere classified, initial encounter: Secondary | ICD-10-CM | POA: Insufficient documentation

## 2022-07-03 DIAGNOSIS — Z7901 Long term (current) use of anticoagulants: Secondary | ICD-10-CM | POA: Diagnosis not present

## 2022-07-03 DIAGNOSIS — L988 Other specified disorders of the skin and subcutaneous tissue: Secondary | ICD-10-CM | POA: Insufficient documentation

## 2022-07-03 DIAGNOSIS — I5042 Chronic combined systolic (congestive) and diastolic (congestive) heart failure: Secondary | ICD-10-CM | POA: Insufficient documentation

## 2022-07-03 DIAGNOSIS — Z923 Personal history of irradiation: Secondary | ICD-10-CM | POA: Diagnosis not present

## 2022-07-03 DIAGNOSIS — Y838 Other surgical procedures as the cause of abnormal reaction of the patient, or of later complication, without mention of misadventure at the time of the procedure: Secondary | ICD-10-CM | POA: Insufficient documentation

## 2022-07-03 DIAGNOSIS — R32 Unspecified urinary incontinence: Secondary | ICD-10-CM | POA: Diagnosis not present

## 2022-07-03 DIAGNOSIS — E11622 Type 2 diabetes mellitus with other skin ulcer: Secondary | ICD-10-CM | POA: Insufficient documentation

## 2022-07-03 DIAGNOSIS — I480 Paroxysmal atrial fibrillation: Secondary | ICD-10-CM | POA: Diagnosis not present

## 2022-07-03 NOTE — Progress Notes (Addendum)
EMBER, HENRIKSON ([id number]) Visit Report for 07/03/2022 Chief Complaint Document Details Patient Name: Rhonda Thornton, Rhonda C. Date of Service: 07/03/2022 11:00 AM Medical Record Number: [id number] Patient Account Number: [id number] Date of Birth/Sex: Mar 25, 1951 (71 y.o. F) Treating RN: Rhonda Thornton Primary Care Provider: Denton Thornton Other Clinician: Referring Provider: Denton Thornton Treating Provider/Extender: Rhonda Thornton in Treatment: 8 Information Obtained from: Patient Chief Complaint Left breast and bilateral gluteal ulcers Electronic Signature(s) Signed: 07/03/2022 10:54:47 AM By: Rhonda Keeler PA-C Entered By: Rhonda Thornton on 07/03/2022 10:54:46 Gorney, Kaylise C. ([id number]) -------------------------------------------------------------------------------- HPI Details Patient Name: Rhonda Thornton, Rhonda C. Date of Service: 07/03/2022 11:00 AM Medical Record Number: [id number] Patient Account Number: [id number] Date of Birth/Sex: November 21, 1951 (71 y.o. F) Treating RN: Rhonda Thornton Primary Care Provider: Denton Thornton Other Clinician: Referring Provider: Denton Thornton Treating Provider/Extender: Rhonda Thornton in Treatment: 8 History of Present Illness HPI Description: 05-06-2022 upon evaluation today patient appears to be doing somewhat poorly in regard to wound she has not 2 locations 1 in the left breast which is a previous site from August 2022 of radiation therapy following a lumpectomy. Subsequently she seemed to have done well according to what the patient tells me today. In the end though she unfortunately ended up with an abscess more recent. August 2002 was when she ended with the radiation and she had surgery at [facility] due to an abscess in the left breast on 03-30-2022 Easter Sunday of this year. Subsequently the patient does have atrial fibrillation for which she is on Eliquis. She also tells me that she is having some discomfort in regard to the breast but its not too terrible. Other  than this she also has some irritant contact dermatitis due to urinary incontinence noted over the gluteal area. There may be some component of pressure but I think primarily this is yeast in urine related. Unfortunately the incontinence is something she has been dealing with for quite some time. She had a DuoDERM on this area that is definitely not can to be the best way to go at this point. 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 06-12-2022 upon evaluation today patient's wounds actually appear to be doing well her gluteal regions actually are completely closed. With that being said the on her left breast is extensively packed much more than is necessary or even warranted at this point. Again the home health nurse seems to be doing a good job keeping up with the dressings but at the same time I think this was definitely an over pack situation I think we need to address that. I think in fact would probably just drop down to 1/4 inch gauze dressing. 07-03-2022 upon evaluation today patient appears to be doing well currently in regard to her wound all things considered. We have been packing with the packing strip. Again this does seem to be having a lot of drainage and I think the strip is probably best. Still there is a lot of fluid buildup in the breast area which is going to be an ongoing issue at this point. We need to make sure that this is packed well but not overly packed. Electronic Signature(s) Signed: 07/03/2022 2:19:30 PM By: Rhonda Keeler PA-C Entered By: Rhonda Thornton on 07/03/2022 14:19:30 Rhonda Thornton, Rhonda Thornton ([id number]) -------------------------------------------------------------------------------- Physical Exam Details Patient Name: Demattia, [name] Date of Service: 07/03/2022 11:00  AM Medical Record Number:  [id number] Patient Account Number: [id number] Date of Birth/Sex: [birth year] (71 y.o. F) Treating RN: Rhonda Thornton Primary Care Provider: Denton Thornton Other Clinician: Referring Provider: Denton Thornton Treating Provider/Extender: Rhonda Thornton Weeks in Treatment: 8 Constitutional Well-nourished and well-hydrated in no acute distress. Respiratory normal breathing without difficulty. Psychiatric this patient is able to make decisions and demonstrates good insight into disease process. Alert and Oriented x 3. pleasant and cooperative. Notes Patient's wound did have a lot of drainage still I do believe that the packing strip is probably can to be the best way to go at this time. The patient voiced agreement and understanding. Fortunately I do not see any evidence of active infection locally or systemically at this time which is great news. Electronic Signature(s) Signed: 07/03/2022 2:19:54 PM By: Rhonda Keeler PA-C Entered By: Rhonda Thornton on 07/03/2022 14:19:54 Rhonda Thornton, Rhonda Thornton ([id number]) -------------------------------------------------------------------------------- Physician Orders Details Patient Name: Rhonda Thornton, Rhonda C. Date of Service: 07/03/2022 11:00 AM Medical Record Number: [id number] Patient Account Number: [id number] Date of Birth/Sex: 06-Aug-1951 (71 y.o. F) Treating RN: Rhonda Thornton Primary Care Provider: Denton Thornton Other Clinician: Referring Provider: Denton Thornton Treating Provider/Extender: Rhonda Thornton in Treatment: 8 Verbal / Phone Orders: No Diagnosis Coding ICD-10 Coding Code Description T81.31XA Disruption of external operation (surgical) wound, not elsewhere classified, initial encounter L98.492 Non-pressure chronic ulcer of skin of other sites with fat layer exposed L24.A2 Irritant contact dermatitis due to fecal, urinary or dual incontinence L98.8 Other specified disorders of the skin and subcutaneous tissue I48.0 Paroxysmal atrial fibrillation M62.81  Muscle weakness (generalized) E11.622 Type 2 diabetes mellitus with other skin ulcer Z79.01 Long term (current) use of anticoagulants I50.42 Chronic combined systolic (congestive) and diastolic (congestive) heart failure Z99.81 Dependence on supplemental oxygen Follow-up Appointments o Return Appointment in 2 weeks. o Nurse Visit as needed [facility] already handling pt-please try to see pt 3 x week o [place] for wound care. May utilize formulary equivalent dressing for wound treatment orders unless otherwise specified. Home Health Nurse may visit PRN to address patientos wound care needs. - See new orders for left breast o Scheduled days for dressing changes to be completed; exception, patient has scheduled wound care visit that day. o **Please direct any NON-WOUND related issues/requests for orders to patient's Primary Care Physician. **If current dressing causes regression in wound condition, may D/C ordered dressing product/s and apply Normal Saline Moist Dressing daily until next [place] or Other MD appointment. **Notify [facility] of regression in wound condition at [phone]. Bathing/ Shower/ Hygiene o Wash wounds with antibacterial soap and water. Anesthetic (Use 'Patient Medications' Section for Anesthetic Order Entry) o Lidocaine applied to wound bed Edema Control - Lymphedema / Segmental Compressive Device / Other o DO YOUR BEST to sleep in the bed at night. DO NOT sleep in your recliner. Long hours of sitting in a recliner leads to swelling of the legs and/or potential wounds on your backside. Non-Wound Condition o Cleanse affected area with antibacterial soap and water, o Additional non-wound orders/instructions: - Desitin to bottom Off-Loading o Gel wheelchair cushion - Recommend o Gel mattress overlay (Group 1) - Recommend o Turn and reposition every 2 hours Additional  Orders / Instructions o Follow Nutritious Diet and Increase Protein Intake Hyperbaric Oxygen Therapy o Evaluate for HBO Therapy - left breast Wound Treatment Kirt, Everette C. ([id number]) Wound #1 - Breast Wound Laterality: Left Cleanser: Normal Saline  3 x Per Week/30 Days Discharge Instructions: Wash your hands with soap and water. Remove old dressing, discard into plastic bag and place into trash. Cleanse the wound with Normal Saline prior to applying a clean dressing using gauze sponges, not tissues or cotton balls. Do not scrub or use excessive force. Pat dry using gauze sponges, not tissue or cotton balls. Cleanser: Wound Cleanser 3 x Per Week/30 Days Discharge Instructions: Wash your hands with soap and water. Remove old dressing, discard into plastic bag and place into trash. Cleanse the wound with Wound Cleanser prior to applying a clean dressing using gauze sponges, not tissues or cotton balls. Do not scrub or use excessive force. Pat dry using gauze sponges, not tissue or cotton balls. Primary Dressing: Iodoform 1/4x 5 (in/yd) 3 x Per Week/30 Days Discharge Instructions: Apply 1/4 inch packing lightly around 12 cm long to wound. Do not overpack b Secondary Dressing: ABD Pad 5x9 (in/in) 3 x Per Week/30 Days Discharge Instructions: Cover with ABD pad Secured With: Medipore Tape - 74M Medipore H Soft Cloth Surgical Tape, 2x2 (in/yd) 3 x Per Week/30 Days Electronic Signature(s) Signed: 07/03/2022 4:35:29 PM By: Rhonda Keeler PA-C Signed: 07/04/2022 10:45:13 AM By: Rhonda Coria RN Entered By: Rhonda Thornton on 07/03/2022 11:27:13 Rhonda Thornton, Rhonda C. ([id number]) -------------------------------------------------------------------------------- Problem List Details Patient Name: Rhonda Thornton, Rhonda C. Date of Service: 07/03/2022 11:00 AM Medical Record Number: [id number] Patient Account Number: [id number] Date of Birth/Sex: 07-27-1951 (71 y.o. F) Treating RN: Rhonda Thornton Primary Care Provider:  Denton Thornton Other Clinician: Referring Provider: Denton Thornton Treating Provider/Extender: Rhonda Thornton in Treatment: 8 Active Problems ICD-10 Encounter Code Description Active Date MDM Diagnosis T81.31XA Disruption of external operation (surgical) wound, not elsewhere 05/06/2022 No Yes classified, initial encounter L98.492 Non-pressure chronic ulcer of skin of other sites with fat layer exposed 05/06/2022 No Yes L24.A2 Irritant contact dermatitis due to fecal, urinary or dual incontinence 05/06/2022 No Yes L98.8 Other specified disorders of the skin and subcutaneous tissue 05/06/2022 No Yes I48.0 Paroxysmal atrial fibrillation 05/06/2022 No Yes M62.81 Muscle weakness (generalized) 05/06/2022 No Yes E11.622 Type 2 diabetes mellitus with other skin ulcer 05/06/2022 No Yes Z79.01 Long term (current) use of anticoagulants 05/06/2022 No Yes I50.42 Chronic combined systolic (congestive) and diastolic (congestive) heart 05/06/2022 No Yes failure Z99.81 Dependence on supplemental oxygen 05/06/2022 No Yes Inactive Problems Resolved Problems Electronic Signature(s) Signed: 07/03/2022 10:54:43 AM By: Rhonda Keeler PA-C Entered By: Rhonda Thornton on 07/03/2022 10:54:43 Rhonda Thornton, Rhonda C. ([id number]) Rhonda Thornton, Rhonda C. ([id number]) -------------------------------------------------------------------------------- Progress Note Details Patient Name: Rhonda Thornton, Rhonda C. Date of Service: 07/03/2022 11:00 AM Medical Record Number: [id number] Patient Account Number: [id number] Date of Birth/Sex: 1951/10/10 (71 y.o. F) Treating RN: Rhonda Thornton Primary Care Provider: Denton Thornton Other Clinician: Referring Provider: Denton Thornton Treating Provider/Extender: Rhonda Thornton in Treatment: 8 Subjective Chief Complaint Information obtained from Patient Left breast and bilateral gluteal ulcers History of Present Illness (HPI) 05-06-2022 upon evaluation today patient appears to be doing somewhat poorly in regard  to wound she has not 2 locations 1 in the left breast which is a previous site from August 2022 of radiation therapy following a lumpectomy. Subsequently she seemed to have done well according to what the patient tells me today. In the end though she unfortunately ended up with an abscess more recent. August 2002 was when she ended with the radiation and she had surgery at [facility] due to an abscess in the left breast on 03-30-2022 Easter Sunday of this year.  Subsequently the patient does have atrial fibrillation for which she is on Eliquis. She also tells me that she is having some discomfort in regard to the breast but its not too terrible. Other than this she also has some irritant contact dermatitis due to urinary incontinence noted over the gluteal area. There may be some component of pressure but I think primarily this is yeast in urine related. Unfortunately the incontinence is something she has been dealing with for quite some time. She had a DuoDERM on this area that is definitely not can to be the best way to go at this point. 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 06-12-2022 upon evaluation today patient's wounds actually appear to be doing well her gluteal regions actually are completely closed. With that being said the on her left breast is extensively packed much more than is necessary or even warranted at this point. Again the home health nurse seems to be doing a good job keeping up with the dressings but at the same time I think this was definitely an over pack situation I think we need to address that. I think in fact would probably just drop down to 1/4 inch gauze dressing. 07-03-2022 upon evaluation today patient appears to be doing well currently in regard to her wound all things considered. We have been packing with the  packing strip. Again this does seem to be having a lot of drainage and I think the strip is probably best. Still there is a lot of fluid buildup in the breast area which is going to be an ongoing issue at this point. We need to make sure that this is packed well but not overly packed. Objective Constitutional Well-nourished and well-hydrated in no acute distress. Vitals Time Taken: 11:20 AM, Height: 64 in, Weight: 225 lbs, BMI: 38.6, Temperature: 98.2 F, Pulse: 67 bpm, Respiratory Rate: 18 breaths/min, Blood Pressure: 118/60 mmHg. Respiratory normal breathing without difficulty. Psychiatric this patient is able to make decisions and demonstrates good insight into disease process. Alert and Oriented x 3. pleasant and cooperative. General Notes: Patient's wound did have a lot of drainage still I do believe that the packing strip is probably can to be the best way to go at this time. The patient voiced agreement and understanding. Fortunately I do not see any evidence of active infection locally or systemically at this time which is great news. Integumentary (Hair, Skin) Wound #1 status is Open. Original cause of wound was Surgical Injury. The date acquired was: 03/30/2022. The wound has been in treatment 8 weeks. The wound is located on the Left Breast. The wound measures 0.4cm length x 0.3cm width x 2.5cm depth; 0.094cm^2 area and 0.236cm^3 volume. There is Fat Layer (Subcutaneous Tissue) exposed. There is no tunneling or undermining noted. There is a large amount of serous drainage noted. There is large (67-100%) pink granulation within the wound bed. There is a small (1-33%) amount of necrotic tissue within the wound bed including Adherent Slough. Rhonda Thornton, Rhonda Thornton ([id number]) Assessment Active Problems ICD-10 Disruption of external operation (surgical) wound, not elsewhere classified, initial encounter Non-pressure chronic ulcer of skin of other sites with fat layer exposed Irritant  contact dermatitis due to fecal, urinary or dual incontinence Other specified disorders of the skin and subcutaneous tissue Paroxysmal atrial fibrillation Muscle weakness (generalized) Type 2 diabetes mellitus with other skin ulcer Long  term (current) use of anticoagulants Chronic combined systolic (congestive) and diastolic (congestive) heart failure Dependence on supplemental oxygen Plan Follow-up Appointments: Return Appointment in 2 weeks. Nurse Visit as needed Home Health: [facility] already handling pt-please try to see pt 3 x week [place] for wound care. May utilize formulary equivalent dressing for wound treatment orders unless otherwise specified. Home Health Nurse may visit PRN to address patient s wound care needs. - See new orders for left breast Scheduled days for dressing changes to be completed; exception, patient has scheduled wound care visit that day. **Please direct any NON-WOUND related issues/requests for orders to patient's Primary Care Physician. **If current dressing causes regression in wound condition, may D/C ordered dressing product/s and apply Normal Saline Moist Dressing daily until next [place] or Other MD appointment. **Notify [facility] of regression in wound condition at [phone]. Bathing/ Shower/ Hygiene: Wash wounds with antibacterial soap and water. Anesthetic (Use 'Patient Medications' Section for Anesthetic Order Entry): Lidocaine applied to wound bed Edema Control - Lymphedema / Segmental Compressive Device / Other: DO YOUR BEST to sleep in the bed at night. DO NOT sleep in your recliner. Long hours of sitting in a recliner leads to swelling of the legs and/or potential wounds on your backside. Non-Wound Condition: Cleanse affected area with antibacterial soap and water, Additional non-wound orders/instructions: - Desitin to bottom Off-Loading: Gel wheelchair cushion - Recommend Gel  mattress overlay (Group 1) - Recommend Turn and reposition every 2 hours Additional Orders / Instructions: Follow Nutritious Diet and Increase Protein Intake Hyperbaric Oxygen Therapy: Evaluate for HBO Therapy - left breast WOUND #1: - Breast Wound Laterality: Left Cleanser: Normal Saline 3 x Per Week/30 Days Discharge Instructions: Wash your hands with soap and water. Remove old dressing, discard into plastic bag and place into trash. Cleanse the wound with Normal Saline prior to applying a clean dressing using gauze sponges, not tissues or cotton balls. Do not scrub or use excessive force. Pat dry using gauze sponges, not tissue or cotton balls. Cleanser: Wound Cleanser 3 x Per Week/30 Days Discharge Instructions: Wash your hands with soap and water. Remove old dressing, discard into plastic bag and place into trash. Cleanse the wound with Wound Cleanser prior to applying a clean dressing using gauze sponges, not tissues or cotton balls. Do not scrub or use excessive force. Pat dry using gauze sponges, not tissue or cotton balls. Primary Dressing: Iodoform 1/4x 5 (in/yd) 3 x Per Week/30 Days Discharge Instructions: Apply 1/4 inch packing lightly around 12 cm long to wound. Do not overpack b Secondary Dressing: ABD Pad 5x9 (in/in) 3 x Per Week/30 Days Discharge Instructions: Cover with ABD pad Secured With: Medipore Tape - 53M Medipore H Soft Cloth Surgical Tape, 2x2 (in/yd) 3 x Per Week/30 Days 1. I would recommend that we go ahead and continue with the wound care measures as before and the patient is in agreement with plan this includes the quarter inch packing strip into the left breast area which I think is good to be the most appropriate way to manage this going forward. 2. Would recommend as well that we use iodoform gauze. 3. I am also going to suggest ABD pad to cover and secured with tape. Pawling, [place] ([id number]) We will see patient back for reevaluation in 2 weeks here in  the clinic. If anything worsens or changes patient will contact our office for additional recommendations. Electronic Signature(s) Signed: 07/03/2022 2:20:20 PM  By: Rhonda Keeler PA-C Entered By: Rhonda Thornton on 07/03/2022 14:20:19 Tamura, Raelin Thornton Thornton ([id number]) -------------------------------------------------------------------------------- SuperBill Details Patient Name: Fagerstrom, Ethelle C. Date of Service: 07/03/2022 Medical Record Number: [id number] Patient Account Number: [id number] Date of Birth/Sex: Jan 31, 1951 (71 y.o. F) Treating RN: Rhonda Thornton Primary Care Provider: Denton Thornton Other Clinician: Referring Provider: Denton Thornton Treating Provider/Extender: Rhonda Thornton Weeks in Treatment: 8 Diagnosis Coding ICD-10 Codes Code Description T81.31XA Disruption of external operation (surgical) wound, not elsewhere classified, initial encounter L98.492 Non-pressure chronic ulcer of skin of other sites with fat layer exposed L24.A2 Irritant contact dermatitis due to fecal, urinary or dual incontinence L98.8 Other specified disorders of the skin and subcutaneous tissue I48.0 Paroxysmal atrial fibrillation M62.81 Muscle weakness (generalized) E11.622 Type 2 diabetes mellitus with other skin ulcer Z79.01 Long term (current) use of anticoagulants I50.42 Chronic combined systolic (congestive) and diastolic (congestive) heart failure Z99.81 Dependence on supplemental oxygen Facility Procedures CPT4 Code: [id number] Description: 86767 - WOUND CARE VISIT-LEV 2 EST PT Modifier: Quantity: 1 Physician Procedures CPT4 Code: 2094709 Description: 99213 - WC PHYS LEVEL 3 - EST PT Modifier: Quantity: 1 CPT4 Code: Description: ICD-10 Diagnosis Description T81.31XA Disruption of external operation (surgical) wound, not elsewhere classifi L98.492 Non-pressure chronic ulcer of skin of other sites with fat layer exposed L24.A2 Irritant contact dermatitis due to fecal,  urinary or dual incontinence  L98.8 Other specified disorders of the skin and subcutaneous tissue Modifier: ed, initial encounter Quantity: Electronic Signature(s) Signed: 07/03/2022 2:20:35 PM By: Rhonda Keeler PA-C Entered By: Rhonda Thornton on 07/03/2022 14:20:35

## 2022-07-03 NOTE — Progress Notes (Addendum)
TIMMIA, COGBURN ([id number]) Visit Report for 07/03/2022 Arrival Information Details Patient Name: Rhonda Thornton, Rhonda C. Date of Service: 07/03/2022 11:00 AM Medical Record Number: [id number] Patient Account Number: [id number] Date of Birth/Sex: 27-Nov-1951 (71 y.o. F) Treating RN: Carlene Coria Primary Care Dian Laprade: Denton Lank Other Clinician: Referring Shelsea Hangartner: Denton Lank Treating Neil Errickson/Extender: Skipper Cliche in Treatment: 8 Visit Information History Since Last Visit All ordered tests and consults were completed: No Patient Arrived: Wheel Chair Added or deleted any medications: No Arrival Time: 11:14 Any new allergies or adverse reactions: No Accompanied By: self Had a fall or experienced change in No Transfer Assistance: None activities of daily living that may affect Patient Identification Verified: Yes risk of falls: Secondary Verification Process Completed: Yes Signs or symptoms of abuse/neglect since last visito No Patient Requires Transmission-Based No Hospitalized since last visit: No Precautions: Implantable device outside of the clinic excluding No Patient Has Alerts: Yes cellular tissue based products placed in the center Patient Alerts: Patient on Blood since last visit: Thinner Has Dressing in Place as Prescribed: Yes DIABETIC Pain Present Now: No [place] Electronic Signature(s) Signed: 07/04/2022 10:45:13 AM By: Carlene Coria RN Entered By: Carlene Coria on 07/03/2022 11:20:09 Furness, Lucylle C. ([id number]) -------------------------------------------------------------------------------- Clinic Level of Care Assessment Details Patient Name: Rhonda Thornton, Rhonda C. Date of Service: 07/03/2022 11:00 AM Medical Record Number: [id number] Patient Account Number: [id number] Date of Birth/Sex: [birth year] (71 y.o. F) Treating RN: Carlene Coria Primary Care Marino Rogerson: Denton Lank Other Clinician: Referring Leib Elahi: Denton Lank Treating Kasyn Stouffer/Extender:  Skipper Cliche in Treatment: 8 Clinic Level of Care Assessment Items TOOL 4 Quantity Score X - Use when only an EandM is performed on FOLLOW-UP visit 1 0 ASSESSMENTS - Nursing Assessment / Reassessment X - Reassessment of Co-morbidities (includes updates in patient status) 1 10 X- 1 5 Reassessment of Adherence to Treatment Plan ASSESSMENTS - Wound and Skin Assessment / Reassessment X - Simple Wound Assessment / Reassessment - one wound 1 5 '[]'$  - 0 Complex Wound Assessment / Reassessment - multiple wounds '[]'$  - 0 Dermatologic / Skin Assessment (not related to wound area) ASSESSMENTS - Focused Assessment '[]'$  - Circumferential Edema Measurements - multi extremities 0 '[]'$  - 0 Nutritional Assessment / Counseling / Intervention '[]'$  - 0 Lower Extremity Assessment (monofilament, tuning fork, pulses) '[]'$  - 0 Peripheral Arterial Disease Assessment (using hand held doppler) ASSESSMENTS - Ostomy and/or Continence Assessment and Care '[]'$  - Incontinence Assessment and Management 0 '[]'$  - 0 Ostomy Care Assessment and Management (repouching, etc.) PROCESS - Coordination of Care X - Simple Patient / Family Education for ongoing care 1 15 '[]'$  - 0 Complex (extensive) Patient / Family Education for ongoing care '[]'$  - 0 Staff obtains Programmer, systems, Records, Test Results / Process Orders '[]'$  - 0 Staff telephones HHA, Nursing Homes / Clarify orders / etc '[]'$  - 0 Routine Transfer to another Facility (non-emergent condition) '[]'$  - 0 Routine Hospital Admission (non-emergent condition) '[]'$  - 0 New Admissions / Biomedical engineer / Ordering NPWT, Apligraf, etc. '[]'$  - 0 Emergency Hospital Admission (emergent condition) X- 1 10 Simple Discharge Coordination '[]'$  - 0 Complex (extensive) Discharge Coordination PROCESS - Special Needs '[]'$  - Pediatric / Minor Patient Management 0 '[]'$  - 0 Isolation Patient Management '[]'$  - 0 Hearing / Language / Visual special needs '[]'$  - 0 Assessment of Community assistance  (transportation, D/C planning, etc.) '[]'$  - 0 Additional assistance / Altered mentation '[]'$  - 0 Support Surface(s) Assessment (bed, cushion, seat, etc.) INTERVENTIONS - Wound Cleansing /  Measurement Pfister, Malin C. ([id number]) X- 1 5 Simple Wound Cleansing - one wound '[]'$  - 0 Complex Wound Cleansing - multiple wounds X- 1 5 Wound Imaging (photographs - any number of wounds) '[]'$  - 0 Wound Tracing (instead of photographs) X- 1 5 Simple Wound Measurement - one wound '[]'$  - 0 Complex Wound Measurement - multiple wounds INTERVENTIONS - Wound Dressings X - Small Wound Dressing one or multiple wounds 1 10 '[]'$  - 0 Medium Wound Dressing one or multiple wounds '[]'$  - 0 Large Wound Dressing one or multiple wounds '[]'$  - 0 Application of Medications - topical '[]'$  - 0 Application of Medications - injection INTERVENTIONS - Miscellaneous '[]'$  - External ear exam 0 '[]'$  - 0 Specimen Collection (cultures, biopsies, blood, body fluids, etc.) '[]'$  - 0 Specimen(s) / Culture(s) sent or taken to Lab for analysis '[]'$  - 0 Patient Transfer (multiple staff / Civil Service fast streamer / Similar devices) '[]'$  - 0 Simple Staple / Suture removal (25 or less) '[]'$  - 0 Complex Staple / Suture removal (26 or more) '[]'$  - 0 Hypo / Hyperglycemic Management (close monitor of Blood Glucose) '[]'$  - 0 Ankle / Brachial Index (ABI) - do not check if billed separately X- 1 5 Vital Signs Has the patient been seen at the hospital within the last three years: Yes Total Score: 75 Level Of Care: New/Established - Level 2 Electronic Signature(s) Signed: 07/04/2022 10:45:13 AM By: Carlene Coria RN Entered By: Carlene Coria on 07/03/2022 11:33:23 Lariviere, Jesyka C. ([id number]) -------------------------------------------------------------------------------- Encounter Discharge Information Details Patient Name: Rhonda Thornton, Rhonda C. Date of Service: 07/03/2022 11:00 AM Medical Record Number: [id number] Patient Account Number: [id number] Date of Birth/Sex:  10/20/1951 (71 y.o. F) Treating RN: Carlene Coria Primary Care Arvid Marengo: Denton Lank Other Clinician: Referring Chandrika Sandles: Denton Lank Treating Ross Bender/Extender: Skipper Cliche in Treatment: 8 Encounter Discharge Information Items Discharge Condition: Stable Ambulatory Status: Ambulatory Discharge Destination: Home Transportation: Private Auto Accompanied By: self Schedule Follow-up Appointment: Yes Clinical Summary of Care: Electronic Signature(s) Signed: 07/03/2022 11:38:56 AM By: Carlene Coria RN Entered By: Carlene Coria on 07/03/2022 11:38:55 Weckwerth, Marisal C. ([id number]) -------------------------------------------------------------------------------- Lower Extremity Assessment Details Patient Name: Rhonda Thornton, Rhonda C. Date of Service: 07/03/2022 11:00 AM Medical Record Number: [id number] Patient Account Number: [id number] Date of Birth/Sex: 02-04-1951 (71 y.o. F) Treating RN: Carlene Coria Primary Care Tayva Easterday: Denton Lank Other Clinician: Referring Iris Hairston: Denton Lank Treating Cruzita Lipa/Extender: Jeri Cos Weeks in Treatment: 8 Electronic Signature(s) Signed: 07/04/2022 10:45:13 AM By: Carlene Coria RN Entered By: Carlene Coria on 07/03/2022 11:24:02 [place], Zanovia C. ([id number]) -------------------------------------------------------------------------------- Multi Wound Chart Details Patient Name: Engen, Tressie C. Date of Service: 07/03/2022 11:00 AM Medical Record Number: [id number] Patient Account Number: [id number] Date of Birth/Sex: [birth year] (70 y.o. F) Treating RN: Carlene Coria Primary Care Jazz Biddy: Denton Lank Other Clinician: Referring Tela Kotecki: Denton Lank Treating Mischa Pollard/Extender: Jeri Cos Weeks in Treatment: 8 Vital Signs Height(in): 25 Pulse(bpm): 82 Weight(lbs): 225 Blood Pressure(mmHg): 118/60 Body Mass Index(BMI): 38.6 Temperature(F): 98.2 Respiratory Rate(breaths/min): 18 Photos: [N/A:N/A] Wound Location: Left Breast N/A N/A Wounding  Event: Surgical Injury N/A N/A Primary Etiology: Open Surgical Wound N/A N/A Secondary Etiology: Infection - not elsewhere classified N/A N/A Comorbid History: Cataracts, Glaucoma, Anemia, N/A N/A Lymphedema, Sleep Apnea, Arrhythmia, Congestive Heart Failure, Coronary Artery Disease, Hypertension, Type II Diabetes, Osteoarthritis, Neuropathy, Received Radiation Date Acquired: 03/30/2022 N/A N/A Weeks of Treatment: 8 N/A N/A Wound Status: Open N/A N/A Wound Recurrence: No N/A N/A Measurements L x W x D (cm) 0.4x0.3x2.5 N/A N/A Area (cm) : 0.094 N/A  N/A Volume (cm) : 0.236 N/A N/A % Reduction in Area: 20.30% N/A N/A % Reduction in Volume: 20.00% N/A N/A Classification: Full Thickness Without Exposed N/A N/A Support Structures Exudate Amount: Large N/A N/A Exudate Type: Serous N/A N/A Exudate Color: amber N/A N/A Granulation Amount: Large (67-100%) N/A N/A Granulation Quality: Pink N/A N/A Necrotic Amount: Small (1-33%) N/A N/A Exposed Structures: Fat Layer (Subcutaneous Tissue): N/A N/A Yes Fascia: No Tendon: No Muscle: No Joint: No Bone: No Epithelialization: None N/A N/A Treatment Notes Electronic Signature(s) CALLIOPE, DELANGEL ([id number]) Signed: 07/04/2022 10:45:13 AM By: Carlene Coria RN Entered By: Carlene Coria on 07/03/2022 11:24:14 Runnels, Eveny CMarland Kitchen ([id number]) -------------------------------------------------------------------------------- [place] Details Patient Name: Rhonda Thornton, Rhonda C. Date of Service: 07/03/2022 11:00 AM Medical Record Number: [id number] Patient Account Number: [id number] Date of Birth/Sex: 1951-06-06 (71 y.o. F) Treating RN: Carlene Coria Primary Care Dinesh Ulysse: Denton Lank Other Clinician: Referring Leanord Thibeau: Denton Lank Treating Avaley Coop/Extender: Skipper Cliche in Treatment: 8 Active Inactive Electronic Signature(s) Signed: 08/21/2022 11:28:17 AM By: Gretta Cool BSN, RN, CWS, Kim RN, BSN Signed: 09/08/2022 2:33:12 PM By:  Carlene Coria RN Previous Signature: 07/04/2022 10:45:13 AM Version By: Carlene Coria RN Entered By: Gretta Cool, BSN, RN, CWS, Kim on 08/21/2022 11:28:17 [place], Ziasia CMarland Kitchen ([id number]) -------------------------------------------------------------------------------- Pain Assessment Details Patient Name: Rhonda Thornton, Rhonda C. Date of Service: 07/03/2022 11:00 AM Medical Record Number: [id number] Patient Account Number: [id number] Date of Birth/Sex: [birth year] (71 y.o. F) Treating RN: Carlene Coria Primary Care Rosalynn Sergent: Denton Lank Other Clinician: Referring Pressley Barsky: Denton Lank Treating Asahel Risden/Extender: Jeri Cos Weeks in Treatment: 8 Active Problems Location of Pain Severity and Description of Pain Patient Has Paino No Site Locations Pain Management and Medication Current Pain Management: Electronic Signature(s) Signed: 07/04/2022 10:45:13 AM By: Carlene Coria RN Entered By: Carlene Coria on 07/03/2022 11:20:34 Sagraves, Pollyann CMarland Kitchen ([id number]) -------------------------------------------------------------------------------- Patient/Caregiver Education Details Patient Name: Luera, Shaden C. Date of Service: 07/03/2022 11:00 AM Medical Record Number: [id number] Patient Account Number: [id number] Date of Birth/Gender: Nov 30, 1951 (71 y.o. F) Treating RN: Carlene Coria Primary Care Physician: Denton Lank Other Clinician: Referring Physician: Denton Lank Treating Physician/Extender: Skipper Cliche in Treatment: 8 Education Assessment Education Provided To: Patient Education Topics Provided Basic Hygiene: Methods: Explain/Verbal Responses: State content correctly Safety: Methods: Explain/Verbal Responses: State content correctly Electronic Signature(s) Signed: 07/04/2022 10:45:13 AM By: Carlene Coria RN Entered By: Carlene Coria on 07/03/2022 11:33:49 [place], [place]. ([id number]) -------------------------------------------------------------------------------- Wound Assessment Details Patient  Name: Rhonda Thornton, Rhonda C. Date of Service: 07/03/2022 11:00 AM Medical Record Number: [id number] Patient Account Number: [id number] Date of Birth/Sex: 05-18-1951 (71 y.o. F) Treating RN: Carlene Coria Primary Care Hezekiah Veltre: Denton Lank Other Clinician: Referring Aramis Weil: Denton Lank Treating Ivylynn Hoppes/Extender: Jeri Cos Weeks in Treatment: 8 Wound Status Wound Number: 1 Primary Open Surgical Wound Etiology: Wound Location: Left Breast Secondary Infection - not elsewhere classified Wounding Event: Surgical Injury Etiology: Date Acquired: 03/30/2022 Wound Open Weeks Of Treatment: 8 Status: Clustered Wound: No Comorbid Cataracts, Glaucoma, Anemia, Lymphedema, Sleep Apnea, History: Arrhythmia, Congestive Heart Failure, Coronary Artery Disease, Hypertension, Type II Diabetes, Osteoarthritis, Neuropathy, Received Radiation Photos Wound Measurements Length: (cm) 0.4 Width: (cm) 0.3 Depth: (cm) 2.5 Area: (cm) 0.094 Volume: (cm) 0.236 % Reduction in Area: 20.3% % Reduction in Volume: 20% Epithelialization: None Tunneling: No Undermining: No Wound Description Classification: Full Thickness Without Exposed Support Structu Exudate Amount: Large Exudate Type: Serous Exudate Color: amber res Foul Odor After Cleansing: No Slough/Fibrino Yes Wound Bed Granulation Amount: Large (67-100%) Exposed Structure Granulation Quality: Pink Fascia Exposed: No Necrotic  Amount: Small (1-33%) Fat Layer (Subcutaneous Tissue) Exposed: Yes Necrotic Quality: Adherent Slough Tendon Exposed: No Muscle Exposed: No Joint Exposed: No Bone Exposed: No Electronic Signature(s) Signed: 07/04/2022 10:45:13 AM By: Carlene Coria RN Entered By: Carlene Coria on 07/03/2022 11:23:44 Herda, Maurene C. ([id number]) -------------------------------------------------------------------------------- Vitals Details Patient Name: Rhonda Thornton, Rhonda C. Date of Service: 07/03/2022 11:00 AM Medical Record Number:  [id number] Patient Account Number: [id number] Date of Birth/Sex: 07/19/1951 (71 y.o. F) Treating RN: Carlene Coria Primary Care Sanayah Munro: Denton Lank Other Clinician: Referring Sophonie Goforth: Denton Lank Treating Jayde Daffin/Extender: Jeri Cos Weeks in Treatment: 8 Vital Signs Time Taken: 11:20 Temperature (F): 98.2 Height (in): 64 Pulse (bpm): 67 Weight (lbs): 225 Respiratory Rate (breaths/min): 18 Body Mass Index (BMI): 38.6 Blood Pressure (mmHg): 118/60 Reference Range: 80 - 120 mg / dl Electronic Signature(s) Signed: 07/04/2022 10:45:13 AM By: Carlene Coria RN Entered By: Carlene Coria on 07/03/2022 11:20:26

## 2022-07-13 DIAGNOSIS — F32A Depression, unspecified: Secondary | ICD-10-CM | POA: Diagnosis present

## 2022-07-13 DIAGNOSIS — I5032 Chronic diastolic (congestive) heart failure: Secondary | ICD-10-CM | POA: Diagnosis present

## 2022-07-14 NOTE — Progress Notes (Deleted)
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  Oncotype DX score is 0.  INTERVAL HISTORY: Patient returns to clinic today as a hospital follow-up after being admitted with atrial fibrillation as well as left breast cellulitis.  She currently feels well and is essentially back to her baseline. She is tolerating letrozole without significant side effects.  She has no neurologic complaints.  She denies any fevers.  She has a good appetite and denies weight loss.  She has no chest pain, shortness of breath, cough, or hemoptysis.  She denies any nausea, vomiting, constipation, or diarrhea.  She has no urinary complaints.  Patient offers no further specific complaints today.  REVIEW OF SYSTEMS:   Review of Systems  Constitutional: Negative.  Negative for fever, malaise/fatigue and weight loss.  Respiratory: Negative.  Negative for hemoptysis and shortness of breath.   Cardiovascular: Negative.  Negative for chest pain and leg swelling.  Gastrointestinal: Negative.  Negative for abdominal pain.  Genitourinary: Negative.  Negative for dysuria.  Musculoskeletal: Negative.  Negative for back pain.  Skin: Negative.  Negative for rash.  Neurological: Negative.  Negative for dizziness, focal weakness, weakness and headaches.  Psychiatric/Behavioral: Negative.  The patient is not nervous/anxious.     As per HPI. Otherwise, a complete review of systems is  negative.  PAST MEDICAL HISTORY: Past Medical History:  Diagnosis Date   Anemia    Anginal pain ([place])    Arthritis    Breast cancer ([place])    2022 [facility]   Chronic pain syndrome    Degenerative disc disease, lumbar    Edema of left lower extremity    Gastroparesis due to secondary diabetes (HCC)    GERD (gastroesophageal reflux disease)    Heart murmur    History of seizure disorder    Hyperlipidemia    Hypertension    Invasive carcinoma of breast ([place]) 04/25/2021   LEFT; stage 1A; grade I invasive mammary carcinoma; ER/PR (+); HER2/neu (-)   Mild aortic stenosis    Mild mitral regurgitation    Mild tricuspid regurgitation    Morbid obesity (HCC)    Opioid use    therapeutic use for Dx of chronic pain syndrome   OSA (obstructive sleep apnea)    Personal history of radiation therapy    Recurrent falls while walking    uses rolling walker    Requires supplemental oxygen    2L/[place] at bedtime   T2DM (type 2 diabetes mellitus) ([place])    Tubular adenoma of colon     PAST SURGICAL HISTORY: Past Surgical History:  Procedure Laterality Date   ABDOMINAL HYSTERECTOMY  1991   partial   BREAST BIOPSY Left 0505/2022   u/s bx-"X" clip-INVASIVE MAMMARY CARCINOMA WITH TUBULAR FEATURES   BREAST CYST ASPIRATION Right 07/16/2016   neg   BREAST LUMPECTOMY,RADIO FREQ LOCALIZER,AXILLARY SENTINEL LYMPH NODE BIOPSY Left 05/22/2021   Procedure: BREAST LUMPECTOMY,RADIO FREQ LOCALIZER,AXILLARY SENTINEL LYMPH NODE BIOPSY;  Surgeon: Ronny Bacon, MD;  Location: [facility] ORS;  Service: General;  Laterality: Left;   CARDIOVERSION N/A  02/14/2022   Procedure: CARDIOVERSION;  Surgeon: Minna Merritts, MD;  Location: [facility] ORS;  Service: Cardiovascular;  Laterality: N/A;  TEE and cardioversion   CHOLECYSTECTOMY     COLONOSCOPY WITH PROPOFOL N/A 07/30/2016   Procedure: COLONOSCOPY WITH PROPOFOL;  Surgeon: Lollie Sails, MD;  Location: [facility] ENDOSCOPY;  Service: Endoscopy;  Laterality: N/A;    ESOPHAGOGASTRODUODENOSCOPY (EGD) WITH PROPOFOL N/A 09/09/2017   Procedure: ESOPHAGOGASTRODUODENOSCOPY (EGD) WITH PROPOFOL;  Surgeon: Toledo, Benay Pike, MD;  Location: [facility] ENDOSCOPY;  Service: Endoscopy;  Laterality: N/A;   EYE SURGERY Right    for pterygium   FUNCTIONAL ENDOSCOPIC SINUS SURGERY     TEE WITHOUT CARDIOVERSION N/A 02/14/2022   Procedure: TRANSESOPHAGEAL ECHOCARDIOGRAM (TEE);  Surgeon: Minna Merritts, MD;  Location: [facility] ORS;  Service: Cardiovascular;  Laterality: N/A;   TONSILLECTOMY      FAMILY HISTORY: Family History  Problem Relation Age of Onset   Stomach cancer Mother    Colon cancer Brother    Breast cancer Neg Hx     ADVANCED DIRECTIVES (Y/N):  N  HEALTH MAINTENANCE: Social History   Tobacco Use   Smoking status: Never   Smokeless tobacco: Never  Vaping Use   Vaping Use: Never used  Substance Use Topics   Alcohol use: No   Drug use: Never     Colonoscopy:  PAP:  Bone density:  Lipid panel:  No Known Allergies  Current Outpatient Medications  Medication Sig Dispense Refill   alum & mag hydroxide-simeth (MAALOX/MYLANTA) 200-200-20 MG/5ML suspension Take 30 mLs by mouth every 6 (six) hours as needed for indigestion or heartburn.     amiodarone (PACERONE) 200 MG tablet Take 1 tablet (200 mg total) by mouth daily.     apixaban (ELIQUIS) 5 MG TABS tablet Take 1 tablet (5 mg total) by mouth 2 (two) times daily. 60 tablet 3   atorvastatin (LIPITOR) 80 MG tablet Take 80 mg by mouth daily.     calcium carbonate (TUMS - DOSED IN MG ELEMENTAL CALCIUM) 500 MG chewable tablet Chew 1,000 mg by mouth 3 (three) times daily as needed for indigestion or heartburn.     Carboxymethylcellulose Sodium (ARTIFICIAL TEARS OP) Place 1 drop into both eyes daily.     cetirizine (ZYRTEC) 10 MG tablet Take 10 mg by mouth daily.     diltiazem (CARDIZEM CD) 240 MG 24 hr capsule Take 1 capsule (240 mg total) by mouth daily.     dorzolamide-timolol (COSOPT) 22.3-6.8 MG/ML  ophthalmic solution Place 1 drop into both eyes 2 (two) times daily.     fluticasone (FLONASE) 50 MCG/ACT nasal spray Place 2 sprays into both nostrils daily.     glipiZIDE (GLUCOTROL XL) 5 MG 24 hr tablet Take 5 mg by mouth daily.     HYDROcodone-acetaminophen (NORCO) 7.5-325 MG tablet Take 1 tablet by mouth 2 (two) times daily. 15 tablet 0   latanoprost (XALATAN) 0.005 % ophthalmic solution Place 1 drop into both eyes at bedtime.     letrozole (FEMARA) 2.5 MG tablet Take 1 tablet (2.5 mg total) by mouth daily. 90 tablet 3   lisinopril (ZESTRIL) 10 MG tablet Take 10 mg by mouth daily. (Patient not taking: Reported on 03/11/2022)     metFORMIN (GLUCOPHAGE) 500 MG tablet Take 1,000 mg by mouth 2 (two) times daily.     metoprolol tartrate (LOPRESSOR) 25 MG tablet Take 25 mg by mouth daily.     metoprolol tartrate (LOPRESSOR) 50 MG tablet Take 1 tablet (50 mg total) by  mouth 2 (two) times daily. (Patient not taking: Reported on 03/11/2022)     Multiple Vitamin (DAILY-VITE MULTIVITAMIN) TABS Take 1 tablet by mouth daily.     naproxen (NAPROSYN) 500 MG tablet Take 500 mg by mouth 2 (two) times daily.     OXYGEN Inhale 2 L into the lungs at bedtime.     pantoprazole (PROTONIX) 40 MG tablet Take 1 tablet (40 mg total) by mouth daily. 30 tablet 1   polyethylene glycol (MIRALAX / GLYCOLAX) 17 g packet Take 17 g by mouth daily. 14 each 0   pregabalin (LYRICA) 100 MG capsule Take 1 capsule (100 mg total) by mouth 3 (three) times daily. 5 capsule 0   senna-docusate (SENOKOT-S) 8.6-50 MG tablet Take 2 tablets by mouth 2 (two) times daily.     sertraline (ZOLOFT) 100 MG tablet Take 100 mg by mouth daily.     silver sulfADIAZINE (SILVADENE) 1 % cream Apply 1 application topically daily. (Patient not taking: Reported on 03/10/2022) 50 g 0   sodium chloride (OCEAN) 0.65 % SOLN nasal spray Place 1 spray into both nostrils as needed for congestion.     tolterodine (DETROL LA) 4 MG 24 hr capsule Take 4 mg by mouth  daily.     No current facility-administered medications for this visit.    OBJECTIVE: There were no vitals filed for this visit.    There is no height or weight on file to calculate BMI.    ECOG FS:0 - Asymptomatic  General: Well-developed, well-nourished, no acute distress.  Sitting in a wheelchair. Eyes: Pink conjunctiva, anicteric sclera. HEENT: Normocephalic, moist mucous membranes. Breast: Left breast with erythema, nontender.  Easily palpable seroma in upper outer quadrant.  Patient reports erythema is improving. Lungs: No audible wheezing or coughing. Heart: Regular rate and rhythm. Abdomen: Soft, nontender, no obvious distention. Musculoskeletal: No edema, cyanosis, or clubbing. Neuro: Alert, answering all questions appropriately. Cranial nerves grossly intact. Skin: No rashes or petechiae noted. Psych: Normal affect.    LAB RESULTS:  Lab Results  Component Value Date   NA 138 03/10/2022   K 4.8 03/10/2022   CL 105 03/10/2022   CO2 27 03/10/2022   GLUCOSE 70 03/10/2022   BUN 41 (H) 03/10/2022   CREATININE 0.83 03/10/2022   CALCIUM 8.8 (L) 03/10/2022   PROT 7.7 02/08/2022   ALBUMIN 3.8 02/08/2022   AST 18 02/08/2022   ALT 14 02/08/2022   ALKPHOS 63 02/08/2022   BILITOT 1.1 02/08/2022   GFRNONAA >60 03/10/2022   GFRAA >60 08/23/2017    Lab Results  Component Value Date   WBC 6.7 03/10/2022   NEUTROABS 12.0 (H) 02/08/2022   HGB 8.6 (L) 03/10/2022   HCT 29.2 (L) 03/10/2022   MCV 88.5 03/10/2022   PLT 257 03/10/2022     STUDIES: No results found.   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  Oncotype DX score 0.  PLAN:    1. 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  Given her Oncotype DX score of 0, adjuvant chemotherapy was not necessary.  Patient  completed adjuvant XRT on July 23, 2021.  Continue letrozole for total of 5 years completing treatment in August 2027.  No further intervention is needed.  She will require a repeat mammogram in August 2023.  Patient has been instructed to keep her  previously scheduled follow-up in May 2023 for routine evaluation.   2.  Osteopenia: Baseline bone mineral density on July 30, 2021 revealed a T score of -1.2.  Continue calcium and vitamin D supplementation.  Repeat in August 2023 along with mammogram as above. 3.  Atrial fibrillation: Continue follow-up with cardiology as scheduled. 4.  Left breast cellulitis: Patient continues with antibiotics.  Symptoms are improved, but she may need an extended course of treatment.  She has been instructed to keep her follow-up appointment with surgery tomorrow.  Unclear if seroma needs additional aspiration.   Patient expressed understanding and was in agreement with this plan. She also understands that She can call clinic at any time with any questions, concerns, or complaints.    Cancer Staging  Carcinoma of upper-outer quadrant of female breast, left [facility]) Staging form: Breast, AJCC 8th Edition - Clinical stage from 05/02/2021: Stage IA (cT1b, cN0, cM0, G1, ER+, PR+, HER2-) - Signed by Lloyd Huger, MD on 05/07/2021 Histologic grading system: 3 grade system   Lloyd Huger, MD   07/14/2022 2:51 PM

## 2022-07-15 ENCOUNTER — Inpatient Hospital Stay: Payer: Medicare HMO | Attending: Oncology | Admitting: Oncology

## 2022-07-15 DIAGNOSIS — C50412 Malignant neoplasm of upper-outer quadrant of left female breast: Secondary | ICD-10-CM

## 2022-07-17 ENCOUNTER — Ambulatory Visit: Payer: Medicare HMO | Admitting: Internal Medicine

## 2022-07-24 ENCOUNTER — Ambulatory Visit: Payer: Medicare HMO | Admitting: Physician Assistant

## 2022-07-24 ENCOUNTER — Ambulatory Visit: Payer: Medicare HMO | Admitting: Radiation Oncology

## 2022-08-12 ENCOUNTER — Other Ambulatory Visit: Payer: Self-pay | Admitting: Oncology

## 2022-09-28 NOTE — Progress Notes (Signed)
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  Oncotype DX score is 0.  INTERVAL HISTORY: Patient returns to clinic today for routine 71-month evaluation.  She reports multiple hospital admissions in the interim, most recently an infected seroma requiring surgery.  She has increased weakness and fatigue.  She is tolerating letrozole without significant side effects.  She has no neurologic complaints.  She denies any fevers.  She has a good appetite and denies weight loss.  She has no chest pain, shortness of breath, cough, or hemoptysis.  She denies any nausea, vomiting, constipation, or diarrhea.  She has no urinary complaints.  Patient offers no further specific complaints today.  REVIEW OF SYSTEMS:   Review of Systems  Constitutional:  Positive for malaise/fatigue. Negative for fever and weight loss.  Respiratory: Negative.  Negative for hemoptysis and shortness of breath.   Cardiovascular: Negative.  Negative for chest pain and leg swelling.  Gastrointestinal: Negative.  Negative for abdominal pain.  Genitourinary: Negative.  Negative for dysuria.  Musculoskeletal: Negative.  Negative for back pain.  Skin: Negative.  Negative for rash.  Neurological:  Positive for weakness. Negative for dizziness, focal weakness and headaches.  Psychiatric/Behavioral: Negative.  The patient is not nervous/anxious.     As per HPI. Otherwise, a complete review of  systems is negative.  PAST MEDICAL HISTORY: Past Medical History:  Diagnosis Date   Anemia    Anginal pain ([place])    Arthritis    Breast cancer ([place])    2022 [facility]   Chronic pain syndrome    Degenerative disc disease, lumbar    Edema of left lower extremity    Gastroparesis due to secondary diabetes (HCC)    GERD (gastroesophageal reflux disease)    Heart murmur    History of seizure disorder    Hyperlipidemia    Hypertension    Invasive carcinoma of breast ([place]) 04/25/2021   LEFT; stage 1A; grade I invasive mammary carcinoma; ER/PR (+); HER2/neu (-)   Mild aortic stenosis    Mild mitral regurgitation    Mild tricuspid regurgitation    Morbid obesity (HCC)    Opioid use    therapeutic use for Dx of chronic pain syndrome   OSA (obstructive sleep apnea)    Personal history of radiation therapy    Recurrent falls while walking    uses rolling walker    Requires supplemental oxygen    2L/[place] at bedtime   T2DM (type 2 diabetes mellitus) ([place])    Tubular adenoma of colon     PAST SURGICAL HISTORY: Past Surgical History:  Procedure Laterality Date   ABDOMINAL HYSTERECTOMY  1991   partial   BREAST BIOPSY Left 0505/2022   u/s bx-"X" clip-INVASIVE MAMMARY CARCINOMA WITH TUBULAR FEATURES   BREAST CYST ASPIRATION Right 07/16/2016   neg   BREAST LUMPECTOMY,RADIO FREQ LOCALIZER,AXILLARY SENTINEL LYMPH NODE BIOPSY Left 05/22/2021   Procedure: BREAST LUMPECTOMY,RADIO FREQ LOCALIZER,AXILLARY SENTINEL LYMPH NODE BIOPSY;  Surgeon: Ronny Bacon, MD;  Location: [facility] ORS;  Service: General;  Laterality: Left;  CARDIOVERSION N/A 02/14/2022   Procedure: CARDIOVERSION;  Surgeon: Minna Merritts, MD;  Location: [facility] ORS;  Service: Cardiovascular;  Laterality: N/A;  TEE and cardioversion   CHOLECYSTECTOMY     COLONOSCOPY WITH PROPOFOL N/A 07/30/2016   Procedure: COLONOSCOPY WITH PROPOFOL;  Surgeon: Lollie Sails, MD;  Location: [facility] ENDOSCOPY;  Service: Endoscopy;  Laterality: N/A;    ESOPHAGOGASTRODUODENOSCOPY (EGD) WITH PROPOFOL N/A 09/09/2017   Procedure: ESOPHAGOGASTRODUODENOSCOPY (EGD) WITH PROPOFOL;  Surgeon: Toledo, Benay Pike, MD;  Location: [facility] ENDOSCOPY;  Service: Endoscopy;  Laterality: N/A;   EYE SURGERY Right    for pterygium   FUNCTIONAL ENDOSCOPIC SINUS SURGERY     TEE WITHOUT CARDIOVERSION N/A 02/14/2022   Procedure: TRANSESOPHAGEAL ECHOCARDIOGRAM (TEE);  Surgeon: Minna Merritts, MD;  Location: [facility] ORS;  Service: Cardiovascular;  Laterality: N/A;   TONSILLECTOMY      FAMILY HISTORY: Family History  Problem Relation Age of Onset   Stomach cancer Mother    Colon cancer Brother    Breast cancer Neg Hx     ADVANCED DIRECTIVES (Y/N):  N  HEALTH MAINTENANCE: Social History   Tobacco Use   Smoking status: Never   Smokeless tobacco: Never  Vaping Use   Vaping Use: Never used  Substance Use Topics   Alcohol use: No   Drug use: Never     Colonoscopy:  PAP:  Bone density:  Lipid panel:  No Known Allergies  Current Outpatient Medications  Medication Sig Dispense Refill   acetaminophen (TYLENOL) 325 MG tablet Take by mouth.     alum & mag hydroxide-simeth (MAALOX/MYLANTA) 200-200-20 MG/5ML suspension Take 30 mLs by mouth every 6 (six) hours as needed for indigestion or heartburn.     amiodarone (PACERONE) 200 MG tablet Take 1 tablet (200 mg total) by mouth daily.     apixaban (ELIQUIS) 5 MG TABS tablet Take 1 tablet (5 mg total) by mouth 2 (two) times daily. 60 tablet 3   atorvastatin (LIPITOR) 80 MG tablet Take 80 mg by mouth daily.     calcium carbonate (TUMS - DOSED IN MG ELEMENTAL CALCIUM) 500 MG chewable tablet Chew 1,000 mg by mouth 3 (three) times daily as needed for indigestion or heartburn.     cetirizine (ZYRTEC) 10 MG tablet Take 10 mg by mouth daily.     cyanocobalamin (VITAMIN B12) 1000 MCG tablet Take 1 tablet by mouth daily.     diltiazem (CARDIZEM CD) 240 MG 24 hr capsule Take 1 capsule (240 mg total) by mouth daily.      dorzolamide-timolol (COSOPT) 22.3-6.8 MG/ML ophthalmic solution Place 1 drop into both eyes 2 (two) times daily.     DULoxetine (CYMBALTA) 60 MG capsule Take 60 mg by mouth daily.     empagliflozin (JARDIANCE) 10 MG TABS tablet Take 1 tablet by mouth daily.     fluticasone (FLONASE) 50 MCG/ACT nasal spray Place 2 sprays into both nostrils daily.     furosemide (LASIX) 20 MG tablet Take 20 mg by mouth daily.     latanoprost (XALATAN) 0.005 % ophthalmic solution Place 1 drop into both eyes at bedtime.     letrozole (FEMARA) 2.5 MG tablet TAKE 1 TABLET BY MOUTH ONCE DAILY. 90 tablet 0   melatonin 3 MG TABS tablet Take by mouth.     metFORMIN (GLUCOPHAGE) 500 MG tablet Take 1,000 mg by mouth 2 (two) times daily.     Multiple Vitamin (DAILY-VITE MULTIVITAMIN) TABS Take 1 tablet by mouth daily.     MYRBETRIQ 25  MG TB24 tablet Take 25 mg by mouth daily.     OXYGEN Inhale 2 L into the lungs at bedtime.     pantoprazole (PROTONIX) 40 MG tablet Take 1 tablet (40 mg total) by mouth daily. 30 tablet 1   polyethylene glycol (MIRALAX / GLYCOLAX) 17 g packet Take 17 g by mouth daily. 14 each 0   pregabalin (LYRICA) 100 MG capsule Take 1 capsule (100 mg total) by mouth 3 (three) times daily. 5 capsule 0   traMADol (ULTRAM) 50 MG tablet Take 50 mg by mouth 2 (two) times daily as needed.     traZODone (DESYREL) 50 MG tablet Take by mouth.     No current facility-administered medications for this visit.    OBJECTIVE: Vitals:   10/01/22 1023  BP: (!) 98/46  Pulse: 68  Resp: 18  Temp: 98.3 F (36.8 C)  SpO2: 98%     Body mass index is 35.53 kg/m.    ECOG FS:2 - Symptomatic, <50% confined to bed  General: Well-developed, well-nourished, no acute distress.  Sitting in a wheelchair. Eyes: Pink conjunctiva, anicteric sclera. HEENT: Normocephalic, moist mucous membranes. Lungs: No audible wheezing or coughing. Heart: Regular rate and rhythm. Abdomen: Soft, nontender, no obvious  distention. Musculoskeletal: No edema, cyanosis, or clubbing. Neuro: Alert, answering all questions appropriately. Cranial nerves grossly intact. Skin: No rashes or petechiae noted. Psych: Normal affect.   LAB RESULTS:  Lab Results  Component Value Date   NA 138 03/10/2022   K 4.8 03/10/2022   CL 105 03/10/2022   CO2 27 03/10/2022   GLUCOSE 70 03/10/2022   BUN 41 (H) 03/10/2022   CREATININE 0.83 03/10/2022   CALCIUM 8.8 (L) 03/10/2022   PROT 7.7 02/08/2022   ALBUMIN 3.8 02/08/2022   AST 18 02/08/2022   ALT 14 02/08/2022   ALKPHOS 63 02/08/2022   BILITOT 1.1 02/08/2022   GFRNONAA >60 03/10/2022   GFRAA >60 08/23/2017    Lab Results  Component Value Date   WBC 6.7 03/10/2022   NEUTROABS 12.0 (H) 02/08/2022   HGB 8.6 (L) 03/10/2022   HCT 29.2 (L) 03/10/2022   MCV 88.5 03/10/2022   PLT 257 03/10/2022     STUDIES: No results found.   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  Oncotype DX score 0.  PLAN:    1. 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  Given her Oncotype DX score of 0, adjuvant chemotherapy was not necessary.  Patient completed adjuvant XRT on July 23, 2021.  Continue letrozole for total of 5 years completing treatment in August 2027.  No further intervention is needed.  Unilateral left breast mammogram on December 05, 2021 was reported BI-RADS 2.  Patient will require bilateral mammogram in December 2023.   2.  Osteopenia: Baseline bone mineral density on July 30, 2021 revealed a T score of -1.2.  Continue calcium and vitamin D supplementation.  Repeat in the near future. 3.  Atrial fibrillation: Continue follow-up with cardiology as scheduled. 4.  Left breast cellulitis: Resolved.  Patient was [facility] admitted to [facility] for surgical  intervention.  Patient expressed understanding and was in agreement with this plan. She also understands that She can call clinic at any time with any questions, concerns, or complaints.    Cancer Staging  Carcinoma of upper-outer quadrant of female  breast, left [facility]) Staging form: Breast, AJCC 8th Edition - Clinical stage from 05/02/2021: Stage IA (cT1b, cN0, cM0, G1, ER+, PR+, HER2-) - Signed by Lloyd Huger, MD on 05/07/2021 Histologic grading system: 3 grade system   Lloyd Huger, MD   10/02/2022 6:38 AM

## 2022-10-01 ENCOUNTER — Inpatient Hospital Stay: Payer: Medicare HMO | Attending: Oncology | Admitting: Oncology

## 2022-10-01 ENCOUNTER — Encounter: Payer: Self-pay | Admitting: Oncology

## 2022-10-01 ENCOUNTER — Ambulatory Visit
Admission: RE | Admit: 2022-10-01 | Discharge: 2022-10-01 | Disposition: A | Discharge status: Home / Self Care | Payer: Medicare HMO | Source: Ambulatory Visit | Attending: Radiation Oncology | Admitting: Radiation Oncology

## 2022-10-01 VITALS — BP 98/46 | HR 68 | Temp 98.3°F | Resp 18 | Ht 64.0 in | Wt 207.0 lb

## 2022-10-01 DIAGNOSIS — Z79811 Long term (current) use of aromatase inhibitors: Secondary | ICD-10-CM | POA: Diagnosis not present

## 2022-10-01 DIAGNOSIS — Z1231 Encounter for screening mammogram for malignant neoplasm of breast: Secondary | ICD-10-CM | POA: Diagnosis not present

## 2022-10-01 DIAGNOSIS — Z170 Estrogen receptor positive status [ER+]: Secondary | ICD-10-CM | POA: Diagnosis not present

## 2022-10-01 DIAGNOSIS — C50412 Malignant neoplasm of upper-outer quadrant of left female breast: Secondary | ICD-10-CM

## 2022-10-01 DIAGNOSIS — I4891 Unspecified atrial fibrillation: Secondary | ICD-10-CM | POA: Insufficient documentation

## 2022-10-01 DIAGNOSIS — Z79899 Other long term (current) drug therapy: Secondary | ICD-10-CM | POA: Insufficient documentation

## 2022-10-01 DIAGNOSIS — M8580 Other specified disorders of bone density and structure, unspecified site: Secondary | ICD-10-CM | POA: Insufficient documentation

## 2022-10-01 NOTE — Progress Notes (Signed)
Radiation Oncology Follow up Note  Name: Rhonda Thornton   Date:   10/01/2022 MRN:  [id number] DOB: 1951-02-25    This 71 y.o. female presents to the clinic today for 1 year follow-up status post whole breast radiation to left breast for stage Ia ER/PR positive invasive mammary carcinoma.  REFERRING PROVIDER: Denton Lank, MD  HPI: Patient is a 71 year old female now at 1 year having completed whole breast radiation to her left breast for stage Ia ER positive invasive mammary carcinoma.  Seen today in follow-up she had multiple medical problems including MRSA drainage of her lumpectomy site.  With prolonged healing.  Of her wound.  She is currently on letrozole tolerating that well.  COMPLICATIONS OF TREATMENT: none  FOLLOW UP COMPLIANCE: keeps appointments   PHYSICAL EXAM:  There were no vitals taken for this visit.  Wheelchair-bound female in NAD quite frail Left breast still slightly erythematous.  She had recent reexcision or drainage of her seroma.  That area seems well-healed no dominant masses noted in either breast no axillary or supraclavicular adenopathy is identified.  Well-developed well-nourished patient in NAD. HEENT reveals PERLA, EOMI, discs not visualized.  Oral cavity is clear. No oral mucosal lesions are identified. Neck is clear without evidence of cervical or supraclavicular adenopathy. Lungs are clear to A&P. Cardiac examination is essentially unremarkable with regular rate and rhythm without murmur rub or thrill. Abdomen is benign with no organomegaly or masses noted. Motor sensory and DTR levels are equal and symmetric in the upper and lower extremities. Cranial nerves II through XII are grossly intact. Proprioception is intact. No peripheral adenopathy or edema is identified. No motor or sensory levels are noted. Crude visual fields are within normal range.  RADIOLOGY RESULTS: No current films for review  PLAN: Present time patient is doing well 1 year out with no  evidence of disease.  She has had a long prolonged healing process from evacuation of seroma.  I am going to turn follow-up care over to medical oncology based on the patient's overall condition and significant transportation needs.  Patient knows to call with any concerns at any time.  I would like to take this opportunity to thank you for allowing me to participate in the care of your patient.[name], MD

## 2022-11-11 ENCOUNTER — Other Ambulatory Visit: Payer: Self-pay | Admitting: Oncology

## 2022-12-08 ENCOUNTER — Other Ambulatory Visit: Payer: Medicare HMO

## 2023-01-26 ENCOUNTER — Ambulatory Visit: Admit: 2023-01-26 | Payer: Medicare HMO | Admitting: Ophthalmology

## 2023-01-26 SURGERY — PHACOEMULSIFICATION, CATARACT, WITH IOL INSERTION
Anesthesia: Topical | Laterality: Right

## 2023-02-04 ENCOUNTER — Ambulatory Visit: Payer: Medicare HMO | Admitting: Urology

## 2023-02-04 VITALS — BP 125/66 | HR 69 | Ht 64.0 in | Wt 211.0 lb

## 2023-02-04 DIAGNOSIS — K5909 Other constipation: Secondary | ICD-10-CM | POA: Diagnosis not present

## 2023-02-04 DIAGNOSIS — N3941 Urge incontinence: Secondary | ICD-10-CM

## 2023-02-04 DIAGNOSIS — N3281 Overactive bladder: Secondary | ICD-10-CM | POA: Diagnosis not present

## 2023-02-04 DIAGNOSIS — N3946 Mixed incontinence: Secondary | ICD-10-CM

## 2023-02-04 LAB — BLADDER SCAN AMB NON-IMAGING

## 2023-02-04 MED ORDER — MIRABEGRON ER 50 MG PO TB24: 50.0000 mg | ORAL_TABLET | Freq: Every day | ORAL | 3 refills | Status: AC

## 2023-02-04 NOTE — Progress Notes (Signed)
Rhonda Thornton,acting as a scribe for Rhonda Espy, MD.,have documented all relevant documentation on the behalf of Rhonda Espy, MD,as directed by  Rhonda Espy, MD while in the presence of Rhonda Espy, MD.  02/04/2023 11:05 AM   Rhonda Thornton 1951/07/25 [id number]  Referring provider: Denton Lank, MD [place]. [address] [place],  [place] [address]  Chief Complaint  Patient presents with   Urinary Incontinence    HPI: 72 year-old female who returns today for evaluation of urgency, frequency and occasional burning with urination along with constipation. She has been seen previously by Dr. Vikki Ports.   She is on Myrbetriq  25 mg that she was started on by Dr. Vikki Ports. At their last visit, they considered PTNS, but it appears that it was never scheduled. She has had a host of hospital admissions and medical issues since that time.   Today, she has provided an update stating that her symptoms have remained consistent. She mentions completing 11 out of the 12 PTNS treatments without observing any improvement in her symptoms. Furthermore, she expresses ongoing struggles with constipation, for which she has tried remedies such as Miralax and prune juice without achieving significant relief.   Her PVR today is 43.  PMH: Past Medical History:  Diagnosis Date   Anemia    Anginal pain ([place])    Arthritis    Breast cancer ([place])    2022 [facility]   Chronic pain syndrome    Degenerative disc disease, lumbar    Edema of left lower extremity    Gastroparesis due to secondary diabetes (HCC)    GERD (gastroesophageal reflux disease)    Heart murmur    History of seizure disorder    Hyperlipidemia    Hypertension    Invasive carcinoma of breast ([place]) 04/25/2021   LEFT; stage 1A; grade I invasive mammary carcinoma; ER/PR (+); HER2/neu (-)   Mild aortic stenosis    Mild mitral regurgitation    Mild tricuspid regurgitation    Morbid obesity (HCC)    Opioid use    therapeutic  use for Dx of chronic pain syndrome   OSA (obstructive sleep apnea)    Personal history of radiation therapy    Recurrent falls while walking    uses rolling walker    Requires supplemental oxygen    2L/[place] at bedtime   T2DM (type 2 diabetes mellitus) ([place])    Tubular adenoma of colon     Surgical History: Past Surgical History:  Procedure Laterality Date   ABDOMINAL HYSTERECTOMY  1991   partial   BREAST BIOPSY Left 0505/2022   u/s bx-"X" clip-INVASIVE MAMMARY CARCINOMA WITH TUBULAR FEATURES   BREAST CYST ASPIRATION Right 07/16/2016   neg   BREAST LUMPECTOMY,RADIO FREQ LOCALIZER,AXILLARY SENTINEL LYMPH NODE BIOPSY Left 05/22/2021   Procedure: BREAST LUMPECTOMY,RADIO FREQ LOCALIZER,AXILLARY SENTINEL LYMPH NODE BIOPSY;  Surgeon: Ronny Bacon, MD;  Location: [facility] ORS;  Service: General;  Laterality: Left;   CARDIOVERSION N/A 02/14/2022   Procedure: CARDIOVERSION;  Surgeon: Minna Merritts, MD;  Location: [facility] ORS;  Service: Cardiovascular;  Laterality: N/A;  TEE and cardioversion   CHOLECYSTECTOMY     COLONOSCOPY WITH PROPOFOL N/A 07/30/2016   Procedure: COLONOSCOPY WITH PROPOFOL;  Surgeon: Lollie Sails, MD;  Location: [facility] ENDOSCOPY;  Service: Endoscopy;  Laterality: N/A;   ESOPHAGOGASTRODUODENOSCOPY (EGD) WITH PROPOFOL N/A 09/09/2017   Procedure: ESOPHAGOGASTRODUODENOSCOPY (EGD) WITH PROPOFOL;  Surgeon: Tol[place], Benay Pike, MD;  Location: [facility] ENDOSCOPY;  Service: Endoscopy;  Laterality: N/A;  EYE SURGERY Right    for pterygium   FUNCTIONAL ENDOSCOPIC SINUS SURGERY     TEE WITHOUT CARDIOVERSION N/A 02/14/2022   Procedure: TRANSESOPHAGEAL ECHOCARDIOGRAM (TEE);  Surgeon: Minna Merritts, MD;  Location: [facility] ORS;  Service: Cardiovascular;  Laterality: N/A;   TONSILLECTOMY      Home Medications:  Allergies as of 02/04/2023   No Known Allergies      Medication List        Accurate as of February 04, 2023 11:05 AM. If you have any questions, ask your nurse or doctor.           acetaminophen 325 MG tablet Commonly known as: TYLENOL Take by mouth.   alum & mag hydroxide-simeth 200-200-20 MG/5ML suspension Commonly known as: MAALOX/MYLANTA Take 30 mLs by mouth every 6 (six) hours as needed for indigestion or heartburn.   amiodarone 200 MG tablet Commonly known as: PACERONE Take 1 tablet (200 mg total) by mouth daily.   apixaban 5 MG Tabs tablet Commonly known as: ELIQUIS Take 1 tablet (5 mg total) by mouth 2 (two) times daily.   atorvastatin 80 MG tablet Commonly known as: LIPITOR Take 80 mg by mouth daily.   calcium carbonate 500 MG chewable tablet Commonly known as: TUMS - dosed in mg elemental calcium Chew 1,000 mg by mouth 3 (three) times daily as needed for indigestion or heartburn.   cetirizine 10 MG tablet Commonly known as: ZYRTEC Take 10 mg by mouth daily.   cyanocobalamin 1000 MCG tablet Commonly known as: VITAMIN B12 Take 1 tablet by mouth daily.   Daily-Vite Multivitamin Tabs Take 1 tablet by mouth daily.   diltiazem 240 MG 24 hr capsule Commonly known as: CARDIZEM CD Take 1 capsule (240 mg total) by mouth daily.   dorzolamide-timolol 2-0.5 % ophthalmic solution Commonly known as: COSOPT Place 1 drop into both eyes 2 (two) times daily.   DULoxetine 60 MG capsule Commonly known as: CYMBALTA Take 60 mg by mouth daily.   empagliflozin 10 MG Tabs tablet Commonly known as: JARDIANCE Take 1 tablet by mouth daily.   fluticasone 50 MCG/ACT nasal spray Commonly known as: FLONASE Place 2 sprays into both nostrils daily.   furosemide 20 MG tablet Commonly known as: LASIX Take 20 mg by mouth daily.   latanoprost 0.005 % ophthalmic solution Commonly known as: XALATAN Place 1 drop into both eyes at bedtime.   letrozole 2.5 MG tablet Commonly known as: FEMARA TAKE 1 TABLET BY MOUTH ONCE DAILY.   melatonin 3 MG Tabs tablet Take by mouth.   metFORMIN 500 MG tablet Commonly known as: GLUCOPHAGE Take 1,000 mg by mouth  2 (two) times daily.   mirabegron ER 50 MG Tb24 tablet Commonly known as: MYRBETRIQ Take 1 tablet (50 mg total) by mouth daily. What changed:  medication strength how much to take   OXYGEN Inhale 2 L into the lungs at bedtime.   pantoprazole 40 MG tablet Commonly known as: Protonix Take 1 tablet (40 mg total) by mouth daily.   polyethylene glycol 17 g packet Commonly known as: MIRALAX / GLYCOLAX Take 17 g by mouth daily.   pregabalin 100 MG capsule Commonly known as: LYRICA Take 1 capsule (100 mg total) by mouth 3 (three) times daily.   traMADol 50 MG tablet Commonly known as: ULTRAM Take 50 mg by mouth 2 (two) times daily as needed.   traZODone 50 MG tablet Commonly known as: DESYREL Take by mouth.        Family  History: Family History  Problem Relation Age of Onset   Stomach cancer Mother    Colon cancer Brother    Breast cancer Neg Hx     Social History:  reports that she has never smoked. She has never used smokeless tobacco. She reports that she does not drink alcohol and does not use drugs.   Physical Exam: BP 125/66   Pulse 69   Ht 5' 4"$  (1.626 m)   Wt 211 lb (95.7 kg)   BMI 36.22 kg/m   Constitutional:  Alert and oriented, No acute distress. Neurologic: Grossly intact, no focal deficits, moving all 4 extremities. Psychiatric: Normal mood and affect.  Assessment & Plan:    1. OAB urge incontinence - We will increase the dose of Myrbetriq to 50 mg from 25 mg.  - Follow up with Dr. Vikki Ports in about 6 weeks.   2. Constipation - Recommended increasing the dose of Miralax with the goal of one soft well-formed bowel movement daily.  - This is a contributing factor to #1.  - We try to avoid anticholinergenics secondary to this issue.   Return in about 6 weeks (around 03/18/2023) for reassessment by Dr. Vikki Ports.   [facility] [address], [place], [place] [address] [phone]

## 2023-02-09 ENCOUNTER — Ambulatory Visit: Admit: 2023-02-09 | Payer: Medicare HMO | Admitting: Ophthalmology

## 2023-02-09 SURGERY — PHACOEMULSIFICATION, CATARACT, WITH IOL INSERTION
Anesthesia: Topical | Laterality: Left

## 2023-03-16 ENCOUNTER — Ambulatory Visit: Payer: Medicare HMO | Admitting: Urology

## 2023-03-18 ENCOUNTER — Encounter: Payer: Self-pay | Admitting: Anesthesiology

## 2023-03-18 ENCOUNTER — Encounter: Payer: Self-pay | Admitting: Ophthalmology

## 2023-03-26 NOTE — Discharge Instructions (Signed)

## 2023-03-30 ENCOUNTER — Ambulatory Visit: Admission: RE | Admit: 2023-03-30 | Payer: Medicare HMO | Source: Home / Self Care | Admitting: Ophthalmology

## 2023-03-30 SURGERY — PHACOEMULSIFICATION, CATARACT, WITH IOL INSERTION
Anesthesia: Topical | Laterality: Right

## 2023-04-02 ENCOUNTER — Inpatient Hospital Stay: Payer: Medicare HMO | Admitting: Oncology

## 2023-04-03 ENCOUNTER — Encounter: Payer: Self-pay | Admitting: Anesthesiology

## 2023-04-07 ENCOUNTER — Encounter: Payer: Self-pay | Admitting: Ophthalmology

## 2023-04-13 ENCOUNTER — Ambulatory Visit: Admit: 2023-04-13 | Payer: Medicare HMO | Admitting: Ophthalmology

## 2023-04-13 SURGERY — PHACOEMULSIFICATION, CATARACT, WITH IOL INSERTION
Anesthesia: Topical | Laterality: Left

## 2023-04-16 ENCOUNTER — Ambulatory Visit
Admission: RE | Admit: 2023-04-16 | Discharge: 2023-04-16 | Disposition: A | Discharge status: Home / Self Care | Payer: Medicare HMO | Source: Ambulatory Visit | Attending: Oncology | Admitting: Oncology

## 2023-04-16 ENCOUNTER — Other Ambulatory Visit: Payer: Self-pay | Admitting: Oncology

## 2023-04-16 DIAGNOSIS — C50412 Malignant neoplasm of upper-outer quadrant of left female breast: Secondary | ICD-10-CM | POA: Insufficient documentation

## 2023-04-16 DIAGNOSIS — Z780 Asymptomatic menopausal state: Secondary | ICD-10-CM | POA: Diagnosis not present

## 2023-04-16 DIAGNOSIS — Z1231 Encounter for screening mammogram for malignant neoplasm of breast: Secondary | ICD-10-CM

## 2023-04-16 DIAGNOSIS — M85852 Other specified disorders of bone density and structure, left thigh: Secondary | ICD-10-CM | POA: Insufficient documentation

## 2023-04-17 ENCOUNTER — Other Ambulatory Visit: Payer: Self-pay | Admitting: Oncology

## 2023-04-17 DIAGNOSIS — C50412 Malignant neoplasm of upper-outer quadrant of left female breast: Secondary | ICD-10-CM

## 2023-04-21 ENCOUNTER — Inpatient Hospital Stay: Payer: Medicare HMO | Attending: Oncology | Admitting: Oncology

## 2023-04-27 ENCOUNTER — Ambulatory Visit
Admission: RE | Admit: 2023-04-27 | Discharge: 2023-04-27 | Disposition: A | Discharge status: Home / Self Care | Payer: Medicare HMO | Source: Ambulatory Visit | Attending: Oncology | Admitting: Oncology

## 2023-04-27 ENCOUNTER — Other Ambulatory Visit: Payer: Self-pay | Admitting: Oncology

## 2023-04-27 DIAGNOSIS — C50412 Malignant neoplasm of upper-outer quadrant of left female breast: Secondary | ICD-10-CM

## 2023-04-27 DIAGNOSIS — N63 Unspecified lump in unspecified breast: Secondary | ICD-10-CM

## 2023-04-27 DIAGNOSIS — R928 Other abnormal and inconclusive findings on diagnostic imaging of breast: Secondary | ICD-10-CM

## 2023-05-05 ENCOUNTER — Ambulatory Visit
Admission: RE | Admit: 2023-05-05 | Discharge: 2023-05-05 | Disposition: A | Discharge status: Home / Self Care | Payer: Medicare HMO | Source: Ambulatory Visit | Attending: Oncology | Admitting: Oncology

## 2023-05-05 DIAGNOSIS — N63 Unspecified lump in unspecified breast: Secondary | ICD-10-CM | POA: Diagnosis present

## 2023-05-05 DIAGNOSIS — R928 Other abnormal and inconclusive findings on diagnostic imaging of breast: Secondary | ICD-10-CM

## 2023-05-05 HISTORY — PX: BREAST BIOPSY: SHX20

## 2023-05-05 MED ORDER — LIDOCAINE HCL (PF) 1 % IJ SOLN
10.0000 mL | Freq: Once | INTRAMUSCULAR | Status: AC
  Administered 2023-05-05: 10 mL
  Filled 2023-05-05: qty 10

## 2023-05-06 LAB — SURGICAL PATHOLOGY

## 2023-05-12 ENCOUNTER — Other Ambulatory Visit: Payer: Self-pay | Admitting: Oncology

## 2023-05-21 ENCOUNTER — Encounter: Payer: Self-pay | Admitting: Oncology

## 2023-05-21 ENCOUNTER — Inpatient Hospital Stay: Payer: Medicare HMO | Attending: Oncology | Admitting: Oncology

## 2023-05-21 VITALS — BP 89/55 | HR 65 | Temp 98.5°F | Resp 18 | Wt 213.7 lb

## 2023-05-21 DIAGNOSIS — Z79811 Long term (current) use of aromatase inhibitors: Secondary | ICD-10-CM | POA: Insufficient documentation

## 2023-05-21 DIAGNOSIS — I4891 Unspecified atrial fibrillation: Secondary | ICD-10-CM | POA: Diagnosis not present

## 2023-05-21 DIAGNOSIS — M8580 Other specified disorders of bone density and structure, unspecified site: Secondary | ICD-10-CM | POA: Insufficient documentation

## 2023-05-21 DIAGNOSIS — C50412 Malignant neoplasm of upper-outer quadrant of left female breast: Secondary | ICD-10-CM | POA: Diagnosis not present

## 2023-05-21 DIAGNOSIS — Z79899 Other long term (current) drug therapy: Secondary | ICD-10-CM | POA: Diagnosis not present

## 2023-05-21 DIAGNOSIS — Z923 Personal history of irradiation: Secondary | ICD-10-CM | POA: Insufficient documentation

## 2023-05-21 DIAGNOSIS — Z170 Estrogen receptor positive status [ER+]: Secondary | ICD-10-CM | POA: Diagnosis not present

## 2023-05-21 DIAGNOSIS — Z800 Family history of malignant neoplasm of digestive organs: Secondary | ICD-10-CM | POA: Diagnosis not present

## 2023-05-21 NOTE — Progress Notes (Signed)
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  Oncotype DX score is 0.  INTERVAL HISTORY: Patient returns to clinic today for routine 82-month evaluation.  She continues to have chronic weakness and fatigue, but otherwise feels well.  She is tolerating letrozole without significant side effects. She has no neurologic complaints.  She denies any recent fevers or illnesses.  She has a good appetite and denies weight loss.  She has no chest pain, shortness of breath, cough, or hemoptysis.  She denies any nausea, vomiting, constipation, or diarrhea.  She has no urinary complaints.  Patient offers no further specific complaints today.  REVIEW OF SYSTEMS:   Review of Systems  Constitutional:  Positive for malaise/fatigue. Negative for fever and weight loss.  Respiratory: Negative.  Negative for hemoptysis and shortness of breath.   Cardiovascular: Negative.  Negative for chest pain and leg swelling.  Gastrointestinal: Negative.  Negative for abdominal pain.  Genitourinary: Negative.  Negative for dysuria.  Musculoskeletal: Negative.  Negative for back pain.  Skin: Negative.  Negative for rash.  Neurological:  Positive for weakness. Negative for dizziness, focal weakness and headaches.  Psychiatric/Behavioral: Negative.  The patient is not nervous/anxious.     As per HPI. Otherwise, a complete review of systems is negative.  PAST MEDICAL HISTORY: Past  Medical History:  Diagnosis Date   Anemia    Anginal pain (HCC)    Arthritis    Breast cancer (HCC)    2022 [facility]   Chronic pain syndrome    Degenerative disc disease, lumbar    Edema of left lower extremity    Gastroparesis due to secondary diabetes (HCC)    GERD (gastroesophageal reflux disease)    Heart murmur    History of seizure disorder    Hyperlipidemia    Hypertension    Invasive carcinoma of breast (HCC) 04/25/2021   LEFT; stage 1A; grade I invasive mammary carcinoma; ER/PR (+); HER2/neu (-)   Mild aortic stenosis    Mild mitral regurgitation    Mild tricuspid regurgitation    Morbid obesity (HCC)    Opioid use    therapeutic use for Dx of chronic pain syndrome   OSA (obstructive sleep apnea)    O2 at night   Personal history of radiation therapy    Recurrent falls while walking    uses rolling walker    Requires supplemental oxygen    2L/[place] at bedtime   T2DM (type 2 diabetes mellitus) (HCC)    Tubular adenoma of colon     PAST SURGICAL HISTORY: Past Surgical History:  Procedure Laterality Date   ABDOMINAL HYSTERECTOMY  1991   partial   BREAST BIOPSY Left 0505/2022   u/s bx-"X" clip-INVASIVE MAMMARY CARCINOMA WITH TUBULAR FEATURES   BREAST BIOPSY Right 05/05/2023   [place] Core Bx,-ribbon clip- path pending   BREAST BIOPSY Right 05/05/2023   [place] RT BREAST BX W LOC DEV 1ST LESION IMG BX SPEC US GUIDE 05/05/2023 [facility]   BREAST CYST ASPIRATION Right 07/16/2016   neg   BREAST LUMPECTOMY,RADIO FREQ  LOCALIZER,AXILLARY SENTINEL LYMPH NODE BIOPSY Left 05/22/2021   Procedure: BREAST LUMPECTOMY,RADIO FREQ LOCALIZER,AXILLARY SENTINEL LYMPH NODE BIOPSY;  Surgeon: Campbell Lerner, MD;  Location: [facility] ORS;  Service: General;  Laterality: Left;   CARDIOVERSION N/A 02/14/2022   Procedure: CARDIOVERSION;  Surgeon: Antonieta Iba, MD;  Location: [facility] ORS;  Service: Cardiovascular;  Laterality: N/A;  TEE and cardioversion   CHOLECYSTECTOMY     COLONOSCOPY WITH  PROPOFOL N/A 07/30/2016   Procedure: COLONOSCOPY WITH PROPOFOL;  Surgeon: Christena Deem, MD;  Location: [facility] ENDOSCOPY;  Service: Endoscopy;  Laterality: N/A;   ESOPHAGOGASTRODUODENOSCOPY (EGD) WITH PROPOFOL N/A 09/09/2017   Procedure: ESOPHAGOGASTRODUODENOSCOPY (EGD) WITH PROPOFOL;  Surgeon: Toledo, Boykin Nearing, MD;  Location: [facility] ENDOSCOPY;  Service: Endoscopy;  Laterality: N/A;   EYE SURGERY Right    for pterygium   FUNCTIONAL ENDOSCOPIC SINUS SURGERY     TEE WITHOUT CARDIOVERSION N/A 02/14/2022   Procedure: TRANSESOPHAGEAL ECHOCARDIOGRAM (TEE);  Surgeon: Antonieta Iba, MD;  Location: [facility] ORS;  Service: Cardiovascular;  Laterality: N/A;   TONSILLECTOMY      FAMILY HISTORY: Family History  Problem Relation Age of Onset   Stomach cancer Mother    Colon cancer Brother    Breast cancer Neg Hx     ADVANCED DIRECTIVES (Y/N):  N  HEALTH MAINTENANCE: Social History   Tobacco Use   Smoking status: Never   Smokeless tobacco: Never  Vaping Use   Vaping Use: Never used  Substance Use Topics   Alcohol use: No   Drug use: Never     Colonoscopy:  PAP:  Bone density:  Lipid panel:  No Known Allergies  Current Outpatient Medications  Medication Sig Dispense Refill   acetaminophen (TYLENOL) 325 MG tablet Take 650 mg by mouth 2 (two) times daily.     alum & mag hydroxide-simeth (MAALOX/MYLANTA) 200-200-20 MG/5ML suspension Take 30 mLs by mouth every 6 (six) hours as needed for indigestion or heartburn.     amiodarone (PACERONE) 200 MG tablet Take 1 tablet (200 mg total) by mouth daily.     apixaban (ELIQUIS) 5 MG TABS tablet Take 1 tablet (5 mg total) by mouth 2 (two) times daily. 60 tablet 3   atorvastatin (LIPITOR) 80 MG tablet Take 80 mg by mouth daily.     calcium carbonate (TUMS - DOSED IN MG ELEMENTAL CALCIUM) 500 MG chewable tablet Chew 1,000 mg by mouth 3 (three) times daily as needed for indigestion or heartburn.     cetirizine (ZYRTEC) 10 MG tablet Take 10 mg by  mouth daily.     cyanocobalamin (VITAMIN B12) 1000 MCG tablet Take 1 tablet by mouth daily.     diltiazem (CARDIZEM CD) 240 MG 24 hr capsule Take 1 capsule (240 mg total) by mouth daily.     dorzolamide-timolol (COSOPT) 22.3-6.8 MG/ML ophthalmic solution Place 1 drop into both eyes 2 (two) times daily.     DULoxetine (CYMBALTA) 60 MG capsule Take 60 mg by mouth daily.     empagliflozin (JARDIANCE) 10 MG TABS tablet Take 1 tablet by mouth daily.     FERROUS SULFATE PO Take by mouth daily.     fluticasone (FLONASE) 50 MCG/ACT nasal spray Place 2 sprays into both nostrils daily.     furosemide (LASIX) 20 MG tablet Take 20 mg by mouth daily.     latanoprost (XALATAN) 0.005 % ophthalmic solution Place 1 drop into both eyes at bedtime.     letrozole (FEMARA) 2.5 MG tablet TAKE 1 TABLET BY MOUTH ONCE DAILY.  90 tablet 0   melatonin 3 MG TABS tablet Take by mouth.     metFORMIN (GLUCOPHAGE) 500 MG tablet Take 1,000 mg by mouth 2 (two) times daily.     mirabegron ER (MYRBETRIQ) 50 MG TB24 tablet Take 1 tablet (50 mg total) by mouth daily. 30 tablet 3   Multiple Vitamin (DAILY-VITE MULTIVITAMIN) TABS Take 1 tablet by mouth daily.     OXYGEN Inhale 2 L into the lungs at bedtime.     polyethylene glycol (MIRALAX / GLYCOLAX) 17 g packet Take 17 g by mouth daily. 14 each 0   pregabalin (LYRICA) 100 MG capsule Take 1 capsule (100 mg total) by mouth 3 (three) times daily. 5 capsule 0   Pseudoephedrine HCl (SUDAFED PO) Take by mouth daily.     traMADol (ULTRAM) 50 MG tablet Take 50 mg by mouth 2 (two) times daily as needed.     pantoprazole (PROTONIX) 40 MG tablet Take 1 tablet (40 mg total) by mouth daily. (Patient taking differently: Take 40 mg by mouth 2 (two) times daily.) 30 tablet 1   No current facility-administered medications for this visit.    OBJECTIVE: Vitals:   05/21/23 1428  BP: (!) 89/55  Pulse: 65  Resp: 18  Temp: 98.5 F (36.9 C)  SpO2: 98%     Body mass index is 36.68 kg/m.    ECOG  FS:2 - Symptomatic, <50% confined to bed  General: Well-developed, well-nourished, no acute distress. Eyes: Pink conjunctiva, anicteric sclera. HEENT: Normocephalic, moist mucous membranes. Lungs: No audible wheezing or coughing. Heart: Regular rate and rhythm. Abdomen: Soft, nontender, no obvious distention. Musculoskeletal: No edema, cyanosis, or clubbing. Neuro: Alert, answering all questions appropriately. Cranial nerves grossly intact. Skin: No rashes or petechiae noted. Psych: Normal affect.  LAB RESULTS:  Lab Results  Component Value Date   NA 138 03/10/2022   K 4.8 03/10/2022   CL 105 03/10/2022   CO2 27 03/10/2022   GLUCOSE 70 03/10/2022   BUN 41 (H) 03/10/2022   CREATININE 0.83 03/10/2022   CALCIUM 8.8 (L) 03/10/2022   PROT 7.7 02/08/2022   ALBUMIN 3.8 02/08/2022   AST 18 02/08/2022   ALT 14 02/08/2022   ALKPHOS 63 02/08/2022   BILITOT 1.1 02/08/2022   GFRNONAA >60 03/10/2022   GFRAA >60 08/23/2017    Lab Results  Component Value Date   WBC 6.7 03/10/2022   NEUTROABS 12.0 (H) 02/08/2022   HGB 8.6 (L) 03/10/2022   HCT 29.2 (L) 03/10/2022   MCV 88.5 03/10/2022   PLT 257 03/10/2022     STUDIES: [place] RT BREAST BX W LOC DEV 1ST LESION IMG BX SPEC US GUIDE  Addendum Date: 05/09/2023   ADDENDUM REPORT: 05/09/2023 10:09 ADDENDUM: PATHOLOGY revealed: A. BREAST, RIGHT AT 6:00, 1 CM FROM NIPPLE; ULTRASOUND-GUIDED CORE NEEDLE BIOPSY: - BENIGN MAMMARY PARENCHYMA WITH FIBROCYSTIC CHANGES. - ADJACENT FIBROSIS, PIGMENT LADEN MACROPHAGES, FOREIGN BODY TYPE GIANT CELLS, AND CHOLESTEROL CLEFTS, WITH ASSOCIATED NECROTIC DEBRIS, MOST SUGGESTIVE OF A RUPTURED CYST. - NEGATIVE FOR ATYPICAL PROLIFERATIVE BREAST DISEASE. Pathology results are CONCORDANT with imaging findings, per Dr. Annia Belt. Pathology results and recommendations were discussed with patient via telephone on 05/06/2023. Patient reported biopsy site doing well with no adverse symptoms, and only slight tenderness at  the site. Post biopsy care instructions were reviewed, questions were answered and my direct phone number was provided. Patient was instructed to call [facility] for any additional questions or concerns related to biopsy site. RECOMMENDATION: Patient instructed to  continue monthly self breast examinations and return for annual bilateral diagnostic mammogram due May 2025. Pathology results reported by Donell Sievert, RN on 05/06/2023. Electronically Signed   By: Annia Belt M.D.   On: 05/09/2023 10:09   Result Date: 05/09/2023 CLINICAL DATA:  Indeterminate right breast mass 6 o'clock position EXAM: ULTRASOUND GUIDED RIGHT BREAST CORE NEEDLE BIOPSY COMPARISON:  Previous exam(s). PROCEDURE: I met with the patient and we discussed the procedure of ultrasound-guided biopsy, including benefits and alternatives. We discussed the high likelihood of a successful procedure. We discussed the risks of the procedure, including infection, bleeding, tissue injury, clip migration, and inadequate sampling. Informed written consent was given. The usual time-out protocol was performed immediately prior to the procedure. Lesion quadrant: Lower outer quadrant Using sterile technique and 1% Lidocaine as local anesthetic, under direct ultrasound visualization, a 14 gauge spring-loaded device was used to perform biopsy of right breast mass 6 o'clock position using a lateral approach. At the conclusion of the procedure ribbon shaped tissue marker clip was deployed into the biopsy cavity. Follow up 2 view mammogram was performed and dictated separately. IMPRESSION: Ultrasound guided biopsy of right breast mass 6 o'clock position. No apparent complications. Electronically Signed: By: Annia Belt M.D. On: 05/05/2023 13:23   MM CLIP PLACEMENT RIGHT  Result Date: 05/05/2023 CLINICAL DATA:  Status post ultrasound biopsy right breast mass. EXAM: 3D DIAGNOSTIC RIGHT MAMMOGRAM POST ULTRASOUND BIOPSY COMPARISON:  Previous exam(s).  FINDINGS: 3D Mammographic images were obtained following ultrasound guided biopsy of right breast mass. The biopsy marking clip is in expected position at the site of biopsy. IMPRESSION: Appropriate positioning of the ribbon shaped biopsy marking clip at the site of biopsy in the right breast mass 6 o'clock position. Final Assessment: Post Procedure Mammograms for Marker Placement Electronically Signed   By: Annia Belt M.D.   On: 05/05/2023 13:37  MM 3D DIAGNOSTIC MAMMOGRAM BILATERAL BREAST  Result Date: 04/27/2023 CLINICAL DATA:  Patient with history of invasive mammary carcinoma in the LEFT breast diagnosed in May 2022. Patient underwent lumpectomy in June 2022. Status post radiation. Patient had a significant seroma at the lumpectomy bed which underwent aspiration. Remote history of RIGHT breast cyst which underwent aspiration in 2017. EXAM: DIGITAL DIAGNOSTIC BILATERAL MAMMOGRAM WITH TOMOSYNTHESIS; ULTRASOUND RIGHT BREAST LIMITED TECHNIQUE: Bilateral digital diagnostic mammography and breast tomosynthesis was performed.; Targeted ultrasound examination of the right breast was performed COMPARISON:  Previous exam(s). ACR Breast Density Category b: There are scattered areas of fibroglandular density. FINDINGS: There is density and architectural distortion within the LEFT breast, consistent with postsurgical changes. These are decreased in size in comparison to prior consistent with resolution of seroma. Expected post treatment related changes. In the RIGHT lower retroareolar breast, there is an oval obscured mass. No additional suspicious findings are noted. On physical exam, there is a mobile mass appreciated in the RIGHT lower breast at anterior depth. Targeted ultrasound performed of the RIGHT lower breast. There is a there is an oval mass with circumscribed margins. However RIGHT intake Anes internal hypoechogenicity. Some of this hypoechogenicity is mobile but some cannot definitively be mobilized. No  definitive internal blood flow. There is favored artifact from motion versus twinkle artifact noted on color Doppler imaging. This measures 15 x 16 x 14 mm and corresponds to the site of mammographic concern. IMPRESSION: 1. There is a 16 mm complicated versus complex cyst in the RIGHT breast at 6 o'clock. Recommend ultrasound-guided aspiration with potential conversion to biopsy for definitive characterization. 2. No  mammographic evidence of malignancy in the LEFT breast. RECOMMENDATION: 1. Recommend RIGHT breast ultrasound-guided aspiration with potential conversion to biopsy x1 2. With benign results, recommend bilateral diagnostic mammogram (with RIGHT and LEFT breast ultrasound as deemed necessary) in 1 year given history of lumpectomy in June 2022. This will establish over 2 years of stability status post lumpectomy. I have discussed the findings and recommendations with the patient. The aspiration with potential conversion to biopsy procedure was discussed with the patient and questions were answered. Patient expressed their understanding of the recommendation. Patient will be scheduled for procedure at her earliest convenience by the schedulers. Ordering provider will be notified. If applicable, a reminder letter will be sent to the patient regarding the next appointment. BI-RADS CATEGORY  4: Suspicious. Electronically Signed   By: Meda Klinefelter M.D.   On: 04/27/2023 13:22  [place] LIMITED ULTRASOUND INCLUDING AXILLA RIGHT BREAST  Result Date: 04/27/2023 CLINICAL DATA:  Patient with history of invasive mammary carcinoma in the LEFT breast diagnosed in May 2022. Patient underwent lumpectomy in June 2022. Status post radiation. Patient had a significant seroma at the lumpectomy bed which underwent aspiration. Remote history of RIGHT breast cyst which underwent aspiration in 2017. EXAM: DIGITAL DIAGNOSTIC BILATERAL MAMMOGRAM WITH TOMOSYNTHESIS; ULTRASOUND RIGHT BREAST LIMITED TECHNIQUE: Bilateral digital  diagnostic mammography and breast tomosynthesis was performed.; Targeted ultrasound examination of the right breast was performed COMPARISON:  Previous exam(s). ACR Breast Density Category b: There are scattered areas of fibroglandular density. FINDINGS: There is density and architectural distortion within the LEFT breast, consistent with postsurgical changes. These are decreased in size in comparison to prior consistent with resolution of seroma. Expected post treatment related changes. In the RIGHT lower retroareolar breast, there is an oval obscured mass. No additional suspicious findings are noted. On physical exam, there is a mobile mass appreciated in the RIGHT lower breast at anterior depth. Targeted ultrasound performed of the RIGHT lower breast. There is a there is an oval mass with circumscribed margins. However RIGHT intake Anes internal hypoechogenicity. Some of this hypoechogenicity is mobile but some cannot definitively be mobilized. No definitive internal blood flow. There is favored artifact from motion versus twinkle artifact noted on color Doppler imaging. This measures 15 x 16 x 14 mm and corresponds to the site of mammographic concern. IMPRESSION: 1. There is a 16 mm complicated versus complex cyst in the RIGHT breast at 6 o'clock. Recommend ultrasound-guided aspiration with potential conversion to biopsy for definitive characterization. 2. No mammographic evidence of malignancy in the LEFT breast. RECOMMENDATION: 1. Recommend RIGHT breast ultrasound-guided aspiration with potential conversion to biopsy x1 2. With benign results, recommend bilateral diagnostic mammogram (with RIGHT and LEFT breast ultrasound as deemed necessary) in 1 year given history of lumpectomy in June 2022. This will establish over 2 years of stability status post lumpectomy. I have discussed the findings and recommendations with the patient. The aspiration with potential conversion to biopsy procedure was discussed with  the patient and questions were answered. Patient expressed their understanding of the recommendation. Patient will be scheduled for procedure at her earliest convenience by the schedulers. Ordering provider will be notified. If applicable, a reminder letter will be sent to the patient regarding the next appointment. BI-RADS CATEGORY  4: Suspicious. Electronically Signed   By: Meda Klinefelter M.D.   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  Oncotype DX score 0.  PLAN:  1. 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  Given her Oncotype DX score of 0, adjuvant chemotherapy was not necessary.  Patient completed adjuvant XRT on July 23, 2021.  Continue letrozole for total of 5 years completing treatment in August 2027.  Patient's most recent mammogram on Apr 27, 2023 was reported as BI-RADS 4, but subsequent right breast biopsy completed on May 05, 2023 was negative for malignancy.  No further intervention is needed.     2.  Osteopenia: Patient's most recent bone mineral density on May 16, 2023 reported T-score of -1.7.  This is decreased from 1 year prior where her T-score was reported -1.2.  Continue calcium and vitamin D supplementation.  Repeat in May 2025.   3.  Atrial fibrillation: Continue follow-up with cardiology as scheduled. 4.  Hypotension: Patient has been instructed to discuss her med list with cardiology.  Patient expressed understanding and was in agreement with this plan. She also understands that She can call clinic at any time with any questions, concerns, or complaints.    Cancer Staging  Carcinoma of upper-outer quadrant of female breast, left [facility]) Staging form: Breast, AJCC 8th Edition - Clinical stage from 05/02/2021: Stage IA (cT1b, cN0, cM0, G1, ER+,  PR+, HER2-) - Signed by Jeralyn Ruths, MD on 05/07/2021 Histologic grading system: 3 grade system   Jeralyn Ruths, MD   05/22/2023 1:18 PM

## 2023-05-21 NOTE — Progress Notes (Signed)
Patient denies any concerns today.  

## 2023-06-08 ENCOUNTER — Ambulatory Visit: Admit: 2023-06-08 | Payer: Medicare HMO | Admitting: Ophthalmology

## 2023-06-08 SURGERY — PHACOEMULSIFICATION, CATARACT, WITH IOL INSERTION
Anesthesia: Topical | Laterality: Right

## 2023-06-22 ENCOUNTER — Encounter: Payer: Self-pay | Admitting: Anesthesiology

## 2023-06-22 ENCOUNTER — Ambulatory Visit: Admit: 2023-06-22 | Payer: Medicare HMO | Admitting: Ophthalmology

## 2023-06-22 ENCOUNTER — Encounter: Payer: Self-pay | Admitting: Ophthalmology

## 2023-06-22 ENCOUNTER — Other Ambulatory Visit: Payer: Self-pay | Admitting: Urology

## 2023-06-22 SURGERY — PHACOEMULSIFICATION, CATARACT, WITH IOL INSERTION
Anesthesia: Topical | Laterality: Left

## 2023-06-22 NOTE — Anesthesia Preprocedure Evaluation (Signed)
Anesthesia Evaluation    Airway        Dental   Pulmonary           Cardiovascular hypertension,   02-14-22 1. Left ventricular ejection fraction, by estimation, is 55 to 60%. The  left ventricle has normal function. The left ventricle has no regional  wall motion abnormalities. There is mild left ventricular hypertrophy.   2. Right ventricular systolic function is mildly reduced. The right  ventricular size is normal.   3. Left atrial size was moderately dilated. No left atrial/left atrial  appendage thrombus was detected.   4. The mitral valve is normal in structure. Trivial mitral valve  regurgitation. No evidence of mitral stenosis.   5. The aortic valve is tricuspid. There is severe calcifcation of the  aortic valve. Aortic valve regurgitation is not visualized. Moderate to  severe aortic valve stenosis by planar measurement, 0.9 to 1 cm sq.  Non-coronary cusp is relatively non-mobile.   3D images obtained.   6. The inferior vena cava is normal in size with greater than 50%  respiratory variability, suggesting right atrial pressure of 3 mmHg.   7. Right atrial size was mildly dilated.   8. Agitated saline contrast bubble study was negative, with no evidence  of any interatrial shunt.   Conclusion(s)/Recommendation(s): Normal biventricular function without  evidence of hemodynamically significant valvular heart disease.     Neuro/Psych    GI/Hepatic   Endo/Other  diabetes    Renal/GU      Musculoskeletal   Abdominal   Peds  Hematology   Anesthesia Other Findings Anginal pain  Hypertension Hyperlipidemia GERD (gastroesophageal reflux disease) Anemia Edema of left lower extremity Arthritis Tubular adenoma of colon Heart murmur Morbid obesity  Degenerative disc disease, lumbar History of seizure disorder T2DM (type 2 diabetes mellitus)  OSA (obstructive sleep apnea) Invasive carcinoma of  breast Gastroparesis due to secondary diabetes  Recurrent falls while walking Mild mitral regurgitation Mild tricuspid regurgitation Mild aortic stenosis Requires supplemental oxygen Chronic pain syndrome Opioid use Breast cancer  Personal history of radiation therapy   Reproductive/Obstetrics                              Anesthesia Physical Anesthesia Plan Anesthesia Quick Evaluation

## 2023-06-22 NOTE — Telephone Encounter (Signed)
Patient cancelled her follow up appointment in April. Patient advised we need to reschedule follow up. Patient states she will see if her PCP can take over refilling this medication for her. Advised patient to just let us know how she would like to proceed but if are to follow this medication we would need to see her. Denying this request at this time.

## 2023-06-23 ENCOUNTER — Other Ambulatory Visit: Payer: Self-pay | Admitting: Urology

## 2023-06-24 ENCOUNTER — Other Ambulatory Visit: Payer: Self-pay

## 2023-06-24 ENCOUNTER — Emergency Department: Payer: Medicare HMO

## 2023-06-24 ENCOUNTER — Observation Stay
Admission: EM | Admit: 2023-06-24 | Discharge: 2023-06-25 | Disposition: A | Discharge status: Home Health | Payer: Medicare HMO | Attending: Student | Admitting: Student

## 2023-06-24 DIAGNOSIS — S066X0A Traumatic subarachnoid hemorrhage without loss of consciousness, initial encounter: Principal | ICD-10-CM | POA: Insufficient documentation

## 2023-06-24 DIAGNOSIS — S0083XA Contusion of other part of head, initial encounter: Secondary | ICD-10-CM

## 2023-06-24 DIAGNOSIS — Z79899 Other long term (current) drug therapy: Secondary | ICD-10-CM | POA: Diagnosis not present

## 2023-06-24 DIAGNOSIS — Z23 Encounter for immunization: Secondary | ICD-10-CM | POA: Insufficient documentation

## 2023-06-24 DIAGNOSIS — S066XAA Traumatic subarachnoid hemorrhage with loss of consciousness status unknown, initial encounter: Secondary | ICD-10-CM | POA: Diagnosis present

## 2023-06-24 DIAGNOSIS — M6281 Muscle weakness (generalized): Secondary | ICD-10-CM | POA: Insufficient documentation

## 2023-06-24 DIAGNOSIS — I480 Paroxysmal atrial fibrillation: Secondary | ICD-10-CM | POA: Diagnosis not present

## 2023-06-24 DIAGNOSIS — I350 Nonrheumatic aortic (valve) stenosis: Secondary | ICD-10-CM

## 2023-06-24 DIAGNOSIS — Y9301 Activity, walking, marching and hiking: Secondary | ICD-10-CM | POA: Insufficient documentation

## 2023-06-24 DIAGNOSIS — G4730 Sleep apnea, unspecified: Secondary | ICD-10-CM | POA: Diagnosis present

## 2023-06-24 DIAGNOSIS — Z853 Personal history of malignant neoplasm of breast: Secondary | ICD-10-CM | POA: Insufficient documentation

## 2023-06-24 DIAGNOSIS — W101XXA Fall (on)(from) sidewalk curb, initial encounter: Secondary | ICD-10-CM | POA: Diagnosis not present

## 2023-06-24 DIAGNOSIS — R2681 Unsteadiness on feet: Secondary | ICD-10-CM | POA: Insufficient documentation

## 2023-06-24 DIAGNOSIS — W19XXXA Unspecified fall, initial encounter: Secondary | ICD-10-CM

## 2023-06-24 DIAGNOSIS — Z7982 Long term (current) use of aspirin: Secondary | ICD-10-CM | POA: Insufficient documentation

## 2023-06-24 DIAGNOSIS — I5032 Chronic diastolic (congestive) heart failure: Secondary | ICD-10-CM | POA: Diagnosis present

## 2023-06-24 DIAGNOSIS — I110 Hypertensive heart disease with heart failure: Secondary | ICD-10-CM | POA: Diagnosis not present

## 2023-06-24 DIAGNOSIS — R296 Repeated falls: Secondary | ICD-10-CM | POA: Insufficient documentation

## 2023-06-24 DIAGNOSIS — C50412 Malignant neoplasm of upper-outer quadrant of left female breast: Secondary | ICD-10-CM | POA: Diagnosis present

## 2023-06-24 DIAGNOSIS — F32A Depression, unspecified: Secondary | ICD-10-CM | POA: Diagnosis present

## 2023-06-24 DIAGNOSIS — E119 Type 2 diabetes mellitus without complications: Secondary | ICD-10-CM

## 2023-06-24 DIAGNOSIS — J9611 Chronic respiratory failure with hypoxia: Secondary | ICD-10-CM | POA: Diagnosis not present

## 2023-06-24 DIAGNOSIS — Z7901 Long term (current) use of anticoagulants: Secondary | ICD-10-CM | POA: Diagnosis not present

## 2023-06-24 DIAGNOSIS — I10 Essential (primary) hypertension: Secondary | ICD-10-CM | POA: Diagnosis present

## 2023-06-24 LAB — CBC WITH DIFFERENTIAL/PLATELET
Abs Immature Granulocytes: 0.04 10*3/uL (ref 0.00–0.07)
Basophils Absolute: 0 10*3/uL (ref 0.0–0.1)
Basophils Relative: 1 %
Eosinophils Absolute: 0.1 10*3/uL (ref 0.0–0.5)
Eosinophils Relative: 2 %
HCT: 40 % (ref 36.0–46.0)
Hemoglobin: 12.9 g/dL (ref 12.0–15.0)
Immature Granulocytes: 1 %
Lymphocytes Relative: 18 %
Lymphs Abs: 1.3 10*3/uL (ref 0.7–4.0)
MCH: 31.2 pg (ref 26.0–34.0)
MCHC: 32.3 g/dL (ref 30.0–36.0)
MCV: 96.6 fL (ref 80.0–100.0)
Monocytes Absolute: 0.6 10*3/uL (ref 0.1–1.0)
Monocytes Relative: 9 %
Neutro Abs: 4.9 10*3/uL (ref 1.7–7.7)
Neutrophils Relative %: 69 %
Platelets: 159 10*3/uL (ref 150–400)
RBC: 4.14 MIL/uL (ref 3.87–5.11)
RDW: 13.9 % (ref 11.5–15.5)
WBC: 7 10*3/uL (ref 4.0–10.5)
nRBC: 0 % (ref 0.0–0.2)

## 2023-06-24 LAB — BASIC METABOLIC PANEL
Anion gap: 10 (ref 5–15)
BUN: 24 mg/dL — ABNORMAL HIGH (ref 8–23)
CO2: 23 mmol/L (ref 22–32)
Calcium: 8.8 mg/dL — ABNORMAL LOW (ref 8.9–10.3)
Chloride: 104 mmol/L (ref 98–111)
Creatinine, Ser: 0.55 mg/dL (ref 0.44–1.00)
GFR, Estimated: >60 mL/min (ref >60)
Glucose, Bld: 122 mg/dL — ABNORMAL HIGH (ref 70–99)
Potassium: 3.7 mmol/L (ref 3.5–5.1)
Sodium: 137 mmol/L (ref 135–145)

## 2023-06-24 LAB — PROTIME-INR
INR: 1.2 (ref 0.8–1.2)
Prothrombin Time: 15.6 seconds — ABNORMAL HIGH (ref 11.4–15.2)

## 2023-06-24 LAB — APTT: aPTT: 30 seconds (ref 24–36)

## 2023-06-24 MED ORDER — FERROUS SULFATE 325 (65 FE) MG PO TABS
325.0000 mg | ORAL_TABLET | Freq: Every day | ORAL | Status: DC
  Administered 2023-06-25: 325 mg via ORAL
  Filled 2023-06-24 (×2): qty 1

## 2023-06-24 MED ORDER — ONDANSETRON HCL 4 MG PO TABS: 4.0000 mg | ORAL_TABLET | Freq: Four times a day (QID) | ORAL | Status: DC | PRN

## 2023-06-24 MED ORDER — ACETAMINOPHEN 650 MG RE SUPP: 650.0000 mg | Freq: Four times a day (QID) | RECTAL | Status: DC | PRN

## 2023-06-24 MED ORDER — ATORVASTATIN CALCIUM 80 MG PO TABS
80.0000 mg | ORAL_TABLET | Freq: Every day | ORAL | Status: DC
  Administered 2023-06-24: 80 mg via ORAL
  Filled 2023-06-24: qty 1

## 2023-06-24 MED ORDER — TRAMADOL HCL 50 MG PO TABS: 50.0000 mg | ORAL_TABLET | Freq: Two times a day (BID) | ORAL | Status: DC | PRN

## 2023-06-24 MED ORDER — ALUM & MAG HYDROXIDE-SIMETH 200-200-20 MG/5ML PO SUSP: 30.0000 mL | Freq: Four times a day (QID) | ORAL | Status: DC | PRN

## 2023-06-24 MED ORDER — AMIODARONE HCL 200 MG PO TABS
200.0000 mg | ORAL_TABLET | Freq: Every day | ORAL | Status: DC
  Administered 2023-06-25: 200 mg via ORAL
  Filled 2023-06-24: qty 1

## 2023-06-24 MED ORDER — EMPTY CONTAINERS FLEXIBLE MISC
900.0000 mg | Freq: Once | Status: AC
  Administered 2023-06-24: 900 mg via INTRAVENOUS
  Filled 2023-06-24: qty 90

## 2023-06-24 MED ORDER — EMPAGLIFLOZIN 10 MG PO TABS
10.0000 mg | ORAL_TABLET | Freq: Every day | ORAL | Status: DC
  Administered 2023-06-25: 10 mg via ORAL
  Filled 2023-06-24: qty 1

## 2023-06-24 MED ORDER — ACETAMINOPHEN 325 MG PO TABS
650.0000 mg | ORAL_TABLET | Freq: Four times a day (QID) | ORAL | Status: DC | PRN
  Administered 2023-06-24: 650 mg via ORAL
  Filled 2023-06-24: qty 2

## 2023-06-24 MED ORDER — FLUORESCEIN SODIUM 1 MG OP STRP
1.0000 | ORAL_STRIP | Freq: Once | OPHTHALMIC | Status: DC
  Filled 2023-06-24 (×2): qty 1

## 2023-06-24 MED ORDER — MIRABEGRON ER 50 MG PO TB24
50.0000 mg | ORAL_TABLET | Freq: Every day | ORAL | Status: DC
  Administered 2023-06-25: 50 mg via ORAL
  Filled 2023-06-24: qty 1

## 2023-06-24 MED ORDER — LATANOPROST 0.005 % OP SOLN
1.0000 [drp] | Freq: Every day | OPHTHALMIC | Status: DC
  Administered 2023-06-24: 1 [drp] via OPHTHALMIC
  Filled 2023-06-24 (×2): qty 2.5

## 2023-06-24 MED ORDER — LETROZOLE 2.5 MG PO TABS
2.5000 mg | ORAL_TABLET | Freq: Every day | ORAL | Status: DC
  Administered 2023-06-25: 2.5 mg via ORAL
  Filled 2023-06-24: qty 1

## 2023-06-24 MED ORDER — DILTIAZEM HCL ER COATED BEADS 120 MG PO CP24
240.0000 mg | ORAL_CAPSULE | Freq: Every day | ORAL | Status: DC
  Administered 2023-06-25: 240 mg via ORAL
  Filled 2023-06-24: qty 2

## 2023-06-24 MED ORDER — LEVETIRACETAM 500 MG PO TABS
500.0000 mg | ORAL_TABLET | Freq: Two times a day (BID) | ORAL | Status: DC
  Administered 2023-06-25: 500 mg via ORAL
  Filled 2023-06-24: qty 1

## 2023-06-24 MED ORDER — DOCUSATE SODIUM 100 MG PO CAPS
100.0000 mg | ORAL_CAPSULE | Freq: Every day | ORAL | Status: DC
  Administered 2023-06-24: 100 mg via ORAL
  Filled 2023-06-24: qty 1

## 2023-06-24 MED ORDER — SODIUM CHLORIDE 0.9% FLUSH
3.0000 mL | Freq: Two times a day (BID) | INTRAVENOUS | Status: DC
  Administered 2023-06-25: 3 mL via INTRAVENOUS

## 2023-06-24 MED ORDER — DORZOLAMIDE HCL-TIMOLOL MAL 2-0.5 % OP SOLN
1.0000 [drp] | Freq: Two times a day (BID) | OPHTHALMIC | Status: DC
  Administered 2023-06-24 – 2023-06-25 (×2): 1 [drp] via OPHTHALMIC
  Filled 2023-06-24 (×2): qty 10

## 2023-06-24 MED ORDER — ONDANSETRON HCL 4 MG/2ML IJ SOLN: 4.0000 mg | Freq: Four times a day (QID) | INTRAMUSCULAR | Status: DC | PRN

## 2023-06-24 MED ORDER — TETANUS-DIPHTH-ACELL PERTUSSIS 5-2.5-18.5 LF-MCG/0.5 IM SUSY
0.5000 mL | PREFILLED_SYRINGE | Freq: Once | INTRAMUSCULAR | Status: AC
  Administered 2023-06-24: 0.5 mL via INTRAMUSCULAR
  Filled 2023-06-24: qty 0.5

## 2023-06-24 MED ORDER — LEVETIRACETAM IN NACL 1000 MG/100ML IV SOLN
1000.0000 mg | INTRAVENOUS | Status: AC
  Administered 2023-06-24: 1000 mg via INTRAVENOUS
  Filled 2023-06-24: qty 100

## 2023-06-24 MED ORDER — CALCIUM CARBONATE ANTACID 500 MG PO CHEW: 1000.0000 mg | CHEWABLE_TABLET | Freq: Three times a day (TID) | ORAL | Status: DC | PRN

## 2023-06-24 MED ORDER — ACETAMINOPHEN 500 MG PO TABS
1000.0000 mg | ORAL_TABLET | Freq: Once | ORAL | Status: AC
  Administered 2023-06-24: 1000 mg via ORAL
  Filled 2023-06-24: qty 2

## 2023-06-24 MED ORDER — PREGABALIN 50 MG PO CAPS
100.0000 mg | ORAL_CAPSULE | Freq: Three times a day (TID) | ORAL | Status: DC
  Administered 2023-06-24 – 2023-06-25 (×3): 100 mg via ORAL
  Filled 2023-06-24 (×3): qty 2

## 2023-06-24 MED ORDER — DULOXETINE HCL 30 MG PO CPEP
60.0000 mg | ORAL_CAPSULE | Freq: Every day | ORAL | Status: DC
  Administered 2023-06-25: 60 mg via ORAL
  Filled 2023-06-24: qty 2

## 2023-06-24 NOTE — Assessment & Plan Note (Signed)
OSA on CPAP Continue CPAP plan continue oxygen

## 2023-06-24 NOTE — ED Provider Notes (Signed)
[facility] Provider Note    Event Date/Time   First MD Initiated Contact with Patient 06/24/23 1729     (approximate)   History   Chief Complaint: Fall   HPI  Rhonda Thornton is a 72 y.o. female with a history of diabetes, paroxysmal atrial fibrillation on Eliquis who reports being in her usual state of health today when she was pushing a grocery cart, accidentally ran off the side of the curb and as it fell it caused her to topple sideways over.  She hit her left face on the ground.  No loss of consciousness.  Complains of pain at the left face periorbital area.  No other headache.  No malocclusion or dental pain.  No neck pain.  No other complaints.  Last Eliquis dose was this morning.     Physical Exam   Triage Vital Signs: ED Triage Vitals  Enc Vitals Group     BP 06/24/23 1732 111/63     Pulse Rate 06/24/23 1732 67     Resp 06/24/23 1732 18     Temp 06/24/23 1732 98.2 F (36.8 C)     Temp Source 06/24/23 1732 Oral     SpO2 06/24/23 1732 100 %     Weight 06/24/23 1736 212 lb (96.2 kg)     Height 06/24/23 1736 5\' 4"  (1.626 m)     Head Circumference --      Peak Flow --      Pain Score 06/24/23 1736 4     Pain Loc --      Pain Edu? --      Excl. in GC? --     Most recent vital signs: Vitals:   06/24/23 1732 06/24/23 1900  BP: 111/63 (!) 112/57  Pulse: 67 (!) 59  Resp: 18 19  Temp: 98.2 F (36.8 C)   SpO2: 100% 100%    General: Awake, no distress.  CV:  Good peripheral perfusion.  Regular rhythm, heart rate 60 Resp:  Normal effort.  Clear to auscultation bilaterally Abd:  No distention.  Soft nontender Other:  Full range of motion all extremities.  No deformities or bony point tenderness. There is left periorbital abrasion and bruising.  There is some conjunctival hemorrhage of the left cornea.  Left pupil and anterior chamber appears normal.  PERRL, EOMI and painless.  Normal concentric pupillary reflex.  No abnormal position of the  globe.  No lacerations.   ED Results / Procedures / Treatments   Labs (all labs ordered are listed, but only abnormal results are displayed) Labs Reviewed  PROTIME-INR - Abnormal; Notable for the following components:      Result Value   Prothrombin Time 15.6 (*)    All other components within normal limits  BASIC METABOLIC PANEL - Abnormal; Notable for the following components:   Glucose, Bld 122 (*)    BUN 24 (*)    Calcium 8.8 (*)    All other components within normal limits  APTT  CBC WITH DIFFERENTIAL/PLATELET  CBC     EKG    RADIOLOGY CT head, maxillofacial, C-spine interpreted by me, no midline shift or large hemorrhage.  Orbit appears intact.  Radiology report reviewed.   PROCEDURES:  .Critical Care  Performed by: Sharman Cheek, MD Authorized by: Sharman Cheek, MD   Critical care provider statement:    Critical care time (minutes):  35   Critical care time was exclusive of:  Separately billable procedures and treating other patients  Critical care was necessary to treat or prevent imminent or life-threatening deterioration of the following conditions:  CNS failure or compromise and trauma   Critical care was time spent personally by me on the following activities:  Development of treatment plan with patient or surrogate, discussions with consultants, evaluation of patient's response to treatment, examination of patient, obtaining history from patient or surrogate, ordering and performing treatments and interventions, ordering and review of laboratory studies, ordering and review of radiographic studies, pulse oximetry, re-evaluation of patient's condition and review of old charts   Care discussed with: admitting provider      MEDICATIONS ORDERED IN ED: Medications  fluorescein ophthalmic strip 1 strip (has no administration in time range)  coag fact Xa recombinant (ANDEXXA) low dose infusion 900 mg (has no administration in time range)  sodium  chloride flush (NS) 0.9 % injection 3 mL (has no administration in time range)  acetaminophen (TYLENOL) tablet 650 mg (has no administration in time range)    Or  acetaminophen (TYLENOL) suppository 650 mg (has no administration in time range)  ondansetron (ZOFRAN) tablet 4 mg (has no administration in time range)    Or  ondansetron (ZOFRAN) injection 4 mg (has no administration in time range)  alum & mag hydroxide-simeth (MAALOX/MYLANTA) 200-200-20 MG/5ML suspension 30 mL (has no administration in time range)  Tdap (BOOSTRIX) injection 0.5 mL (0.5 mLs Intramuscular Given 06/24/23 1846)  acetaminophen (TYLENOL) tablet 1,000 mg (1,000 mg Oral Given 06/24/23 1847)  levETIRAcetam (KEPPRA) IVPB 1000 mg/100 mL premix (1,000 mg Intravenous New Bag/Given 06/24/23 1859)     IMPRESSION / MDM / ASSESSMENT AND PLAN / ED COURSE  I reviewed the triage vital signs and the nursing notes.  DDx: Intracranial hemorrhage, skull fracture, facial fracture, orbital blowout fracture, C-spine fracture  Patient's presentation is most consistent with acute presentation with potential threat to life or bodily function.  Patient presents with mechanical fall causing left facial trauma.  She is on Eliquis.  Will obtain CTs.   Clinical Course as of 06/24/23 1944  Wed Jun 24, 2023  1844 [facility] d/w radiology, noting L temporal SAH. Will give IV keppra and d/w nsgy. [PS]  1919 Injury d/w NSGY. Rec 6h CTH. Agrees with Keppra, Eliquis reversal. Rec overnight observation. [PS]    Clinical Course User Index [PS] Sharman Cheek, MD     ----------------------------------------- 7:43 PM on 06/24/2023 ----------------------------------------- Case discussed with hospitalist for further management.  FINAL CLINICAL IMPRESSION(S) / ED DIAGNOSES   Final diagnoses:  Subarachnoid hemorrhage following injury, no loss of consciousness, initial encounter (HCC)  PAF (paroxysmal atrial fibrillation) (HCC)  Type 2 diabetes  mellitus without complication, without long-term current use of insulin (HCC)  Morbid obesity (HCC)     Rx / DC Orders   ED Discharge Orders     None        Note:  This document was prepared using Dragon voice recognition software and may include unintentional dictation errors.   Sharman Cheek, MD 06/24/23 1944

## 2023-06-24 NOTE — Assessment & Plan Note (Signed)
Continue home meds diltiazem

## 2023-06-24 NOTE — Assessment & Plan Note (Signed)
Continue amiodarone Eliquis on hold due to subarachnoid hemorrhage SCDs for DVT prophylaxis

## 2023-06-24 NOTE — Assessment & Plan Note (Signed)
Continue Femara 

## 2023-06-24 NOTE — Assessment & Plan Note (Addendum)
Facial contusion, with no facial bone injuries Repeat CT head at 1 AM on 06/25/2023 Keppra 500 twice daily pending further neurosurgical recommendation Eliquis was reversed in the ED Multimodal pain control Ice packs Neurologic checks PT eval in the a.m. Neurosurgery consult requested for the a.m.

## 2023-06-24 NOTE — Assessment & Plan Note (Signed)
Clinically euvolemic Continue furosemide, diltiazem

## 2023-06-24 NOTE — Assessment & Plan Note (Signed)
Continue duloxetine

## 2023-06-24 NOTE — ED Notes (Signed)
Pt up to restroom in room, pt was not able to move about easily. This RN moved the bed within 2 ft of the toilet and pt "furniture surfed' and needed assistance to travel the distance to the toilet. Pt c/o weakness from when she was sick last year and in and out of rehab. Pt states "I don't think I'll ever get my strength back." Pt was helped back onto the stretcher, reconnected to vital machine, and bed was placed in the lowest locked position, with call bell in reach. Pt is clean and dry at this time.

## 2023-06-24 NOTE — H&P (Signed)
History and Physical    Patient: Rhonda Thornton:[id number] DOB: Dec 10, 1951 DOA: 06/24/2023 DOS: the patient was seen and examined on 06/24/2023 PCP: Hillery Aldo, MD  Patient coming from: Home  Chief Complaint:  Chief Complaint  Patient presents with   Fall   HPI: Rhonda Thornton is a 72 y.o. female with medical history significant of DM, HTN, HFpEF, nonrheumatic aortic valve stenosis, atrial fibrillation on Eliquis and amiodarone, and sleep apnea who presented to the ED following a accidental fall sustained when she accidentally pushed her shopping cart off the curb causing her to fall down sustaining impact to the left face.  She did not lose consciousness. Denies pain elsewhere ED course and data review: Vitals within normal limits and blood work unremarkable. EKG, personally viewed and interpreted showing sinus at 66 with borderline prolonged QT interval and no ischemic ST-T wave changes. CT head, C-spine and maxillofacial personally reviewed with findings outlined on official report of "small left temporal subarachnoid hemorrhage without mass effect or midline.  No acute facial bone fractures" The ED provider spoke with on-call neurosurgeon, who recommended follow-up CT in 6 hours, IV Keppra prophylaxis and Eliquis reversal and admission for overnight observation.  Will see in the a.m.   Patient received IV Keppra, Eliquis reversal with Kcentra.  Hospitalist consulted for admission.  Review of Systems: As mentioned in the history of present illness. All other systems reviewed and are negative. Past Medical History:  Diagnosis Date   Anemia    Anginal pain (HCC)    Arthritis    Breast cancer (HCC)    2022 [facility]   Chronic pain syndrome    Degenerative disc disease, lumbar    Edema of left lower extremity    Gastroparesis due to secondary diabetes (HCC)    GERD (gastroesophageal reflux disease)    Heart murmur    History of seizure disorder    Hyperlipidemia    Hypertension     Invasive carcinoma of breast (HCC) 04/25/2021   LEFT; stage 1A; grade I invasive mammary carcinoma; ER/PR (+); HER2/neu (-)   Mild aortic stenosis    Mild mitral regurgitation    Mild tricuspid regurgitation    Morbid obesity (HCC)    Opioid use    therapeutic use for Dx of chronic pain syndrome   OSA (obstructive sleep apnea)    O2 at night   Personal history of radiation therapy    Recurrent falls while walking    uses rolling walker    Requires supplemental oxygen    2L/[place] at bedtime   T2DM (type 2 diabetes mellitus) (HCC)    Tubular adenoma of colon    Past Surgical History:  Procedure Laterality Date   ABDOMINAL HYSTERECTOMY  1991   partial   BREAST BIOPSY Left 0505/2022   u/s bx-"X" clip-INVASIVE MAMMARY CARCINOMA WITH TUBULAR FEATURES   BREAST BIOPSY Right 05/05/2023   [place] Core Bx,-ribbon clip- path pending   BREAST BIOPSY Right 05/05/2023   [place] RT BREAST BX W LOC DEV 1ST LESION IMG BX SPEC US GUIDE 05/05/2023 [facility]   BREAST CYST ASPIRATION Right 07/16/2016   neg   BREAST LUMPECTOMY,RADIO FREQ LOCALIZER,AXILLARY SENTINEL LYMPH NODE BIOPSY Left 05/22/2021   Procedure: BREAST LUMPECTOMY,RADIO FREQ LOCALIZER,AXILLARY SENTINEL LYMPH NODE BIOPSY;  Surgeon: Campbell Lerner, MD;  Location: [facility] ORS;  Service: General;  Laterality: Left;   CARDIOVERSION N/A 02/14/2022   Procedure: CARDIOVERSION;  Surgeon: Antonieta Iba, MD;  Location: [facility] ORS;  Service: Cardiovascular;  Laterality: N/A;  TEE and cardioversion   CHOLECYSTECTOMY     COLONOSCOPY WITH PROPOFOL N/A 07/30/2016   Procedure: COLONOSCOPY WITH PROPOFOL;  Surgeon: Christena Deem, MD;  Location: [facility] ENDOSCOPY;  Service: Endoscopy;  Laterality: N/A;   ESOPHAGOGASTRODUODENOSCOPY (EGD) WITH PROPOFOL N/A 09/09/2017   Procedure: ESOPHAGOGASTRODUODENOSCOPY (EGD) WITH PROPOFOL;  Surgeon: Toledo, Boykin Nearing, MD;  Location: [facility] ENDOSCOPY;  Service: Endoscopy;  Laterality: N/A;   EYE SURGERY Right    for pterygium    FUNCTIONAL ENDOSCOPIC SINUS SURGERY     TEE WITHOUT CARDIOVERSION N/A 02/14/2022   Procedure: TRANSESOPHAGEAL ECHOCARDIOGRAM (TEE);  Surgeon: Antonieta Iba, MD;  Location: [facility] ORS;  Service: Cardiovascular;  Laterality: N/A;   TONSILLECTOMY     Social History:  reports that she has never smoked. She has never used smokeless tobacco. She reports that she does not drink alcohol and does not use drugs.  No Known Allergies  Family History  Problem Relation Age of Onset   Stomach cancer Mother    Colon cancer Brother    Breast cancer Neg Hx     Prior to Admission medications   Medication Sig Start Date End Date Taking? Authorizing Provider  acetaminophen (TYLENOL) 325 MG tablet Take 650 mg by mouth 2 (two) times daily. 03/22/22  Yes [provider]  alum & mag hydroxide-simeth (MAALOX/MYLANTA) 200-200-20 MG/5ML suspension Take 30 mLs by mouth every 6 (six) hours as needed for indigestion or heartburn.   Yes [provider]  amiodarone (PACERONE) 200 MG tablet Take 1 tablet (200 mg total) by mouth daily. 02/18/22  Yes Danford, Earl Lites, MD  apixaban (ELIQUIS) 5 MG TABS tablet Take 1 tablet (5 mg total) by mouth 2 (two) times daily. 02/18/22  Yes Danford, Earl Lites, MD  aspirin EC 81 MG tablet Take 162 mg by mouth at bedtime. Swallow whole.   Yes [provider]  atorvastatin (LIPITOR) 80 MG tablet Take 80 mg by mouth daily.   Yes [provider]  calcium carbonate (TUMS - DOSED IN MG ELEMENTAL CALCIUM) 500 MG chewable tablet Chew 1,000 mg by mouth 3 (three) times daily as needed for indigestion or heartburn.   Yes [provider]  cetirizine (ZYRTEC) 10 MG tablet Take 10 mg by mouth daily.   Yes [provider]  cyanocobalamin (VITAMIN B12) 1000 MCG tablet Take 1,000 mcg by mouth daily. 07/25/22 07/25/23 Yes [provider]  diltiazem (CARDIZEM CD) 240 MG 24 hr capsule Take 1 capsule (240 mg total) by mouth daily. 02/18/22   Yes Danford, Earl Lites, MD  Docusate Calcium (STOOL SOFTENER PO) Take 100 mg by mouth at bedtime.   Yes [provider]  dorzolamide-timolol (COSOPT) 22.3-6.8 MG/ML ophthalmic solution Place 1 drop into both eyes 2 (two) times daily. 02/01/21  Yes [provider]  DULoxetine (CYMBALTA) 60 MG capsule Take 60 mg by mouth daily. 09/09/22  Yes [provider]  empagliflozin (JARDIANCE) 10 MG TABS tablet Take 10 mg by mouth daily. 05/15/22  Yes [provider]  ferrous sulfate 325 (65 FE) MG tablet Take 325 mg by mouth daily.   Yes [provider]  fluticasone (FLONASE) 50 MCG/ACT nasal spray Place 2 sprays into both nostrils daily. 09/05/20  Yes [provider]  furosemide (LASIX) 20 MG tablet Take 20 mg by mouth daily. 08/12/22  Yes [provider]  latanoprost (XALATAN) 0.005 % ophthalmic solution Place 1 drop into both eyes at bedtime. 11/05/20  Yes [provider]  letrozole (FEMARA) 2.5 MG  tablet TAKE 1 TABLET BY MOUTH ONCE DAILY. 05/12/23  Yes Jeralyn Ruths, MD  melatonin 3 MG TABS tablet Take 3 mg by mouth at bedtime as needed (Sleep). 07/24/22  Yes [provider]  metFORMIN (GLUCOPHAGE) 500 MG tablet Take 500 mg by mouth 2 (two) times daily.   Yes [provider]  mirabegron ER (MYRBETRIQ) 50 MG TB24 tablet Take 1 tablet (50 mg total) by mouth daily. 02/04/23  Yes Vanna Scotland, MD  Multiple Vitamin (DAILY-VITE MULTIVITAMIN) TABS Take 1 tablet by mouth daily. 01/30/21  Yes [provider]  pantoprazole (PROTONIX) 40 MG tablet Take 1 tablet (40 mg total) by mouth daily. Patient taking differently: Take 40 mg by mouth 2 (two) times daily. 08/23/17 06/24/23 Yes Paduchowski, Caryn Bee, MD  pregabalin (LYRICA) 100 MG capsule Take 1 capsule (100 mg total) by mouth 3 (three) times daily. 02/18/22  Yes Danford, Earl Lites, MD  traMADol (ULTRAM) 50 MG tablet Take 50 mg by mouth 2 (two) times daily as needed.  09/19/22  Yes [provider]  OXYGEN Inhale 2 L into the lungs at bedtime.    [provider]  polyethylene glycol (MIRALAX / GLYCOLAX) 17 g packet Take 17 g by mouth daily. Patient not taking: Reported on 06/24/2023 02/18/22   Alberteen Sam, MD  Polyvinyl Alcohol-Povidone (REFRESH OP) Apply to eye daily. Patient not taking: Reported on 06/24/2023    [provider]    Physical Exam: Vitals:   06/24/23 1732 06/24/23 1736 06/24/23 1900 06/24/23 2036  BP: 111/63  (!) 112/57 (!) 110/54  Pulse: 67  (!) 59 60  Resp: 18  19 20   Temp: 98.2 F (36.8 C)   98 F (36.7 C)  TempSrc: Oral     SpO2: 100%  100% 96%  Weight:  96.2 kg    Height:  5\' 4"  (1.626 m)     Physical Exam Vitals and nursing note reviewed.  Constitutional:      General: She is not in acute distress. HENT:     Head: Normocephalic. Contusion present.     Comments: Contusion right periorbital Cardiovascular:     Rate and Rhythm: Normal rate and regular rhythm.     Heart sounds: Normal heart sounds.  Pulmonary:     Effort: Pulmonary effort is normal.     Breath sounds: Normal breath sounds.  Abdominal:     Palpations: Abdomen is soft.     Tenderness: There is no abdominal tenderness.  Neurological:     Mental Status: Mental status is at baseline.     Data Reviewed: Relevant notes from primary care and specialist visits, past discharge summaries as available in EHR, including Care Everywhere. Prior diagnostic testing as pertinent to current admission diagnoses Updated medications and problem lists for reconciliation ED course, including vitals, labs, imaging, treatment and response to treatment Triage notes, nursing and pharmacy notes and ED provider's notes Notable results as noted in HPI   Assessment and Plan: * Traumatic subarachnoid hemorrhage secondary to accidental fall (HCC) Facial contusion, with no facial bone injuries Keppra 500 twice daily pending further neurosurgical  recommendation Eliquis was reversed in the ED Multimodal pain control Ice packs Neurologic checks PT eval in the a.m. Neurosurgery consult requested for the a.m. Repeat CT head at 1 AM on 06/25/2023--->"Unchanged small focus of subarachnoid hemorrhage over the left temporal lobe"  Paroxysmal atrial fibrillation (HCC) Continue amiodarone Eliquis on hold due to subarachnoid hemorrhage SCDs for DVT prophylaxis  Nonrheumatic aortic valve stenosis  Followed by cardiology  Depression Continue duloxetine  Chronic respiratory failure with hypoxia (HCC) OSA on CPAP Continue CPAP plan continue oxygen  Chronic heart failure with preserved ejection fraction (HCC) Clinically euvolemic Continue furosemide, diltiazem  Essential (primary) hypertension Continue home meds diltiazem  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 Observation status is judged to be reasonable and necessary in order to provide the required intensity of service to ensure the patient's safety. The patient's presenting symptoms, physical exam findings, and initial radiographic and laboratory data in the context of their medical condition is felt to place them at decreased risk for further clinical deterioration. Furthermore, it is anticipated that the patient will be medically stable for discharge from the hospital within 2 midnights of admission.   Author: Andris Baumann, MD 06/24/2023 8:48 PM  For on call review [email or web address].

## 2023-06-24 NOTE — Assessment & Plan Note (Deleted)
Continue CPAP.  

## 2023-06-24 NOTE — ED Notes (Signed)
ED TO INPATIENT HANDOFF REPORT  ED Nurse Name and Phone #: Farley Ly [phone]  S Name/Age/Gender Rhonda Thornton 72 y.o. female Room/Bed: ED12A/ED12A  Code Status   Code Status: Full Code  Home/SNF/Other Home Patient oriented to: self, place, time, and situation Is this baseline? Yes   Triage Complete: Triage complete  Chief Complaint Traumatic subarachnoid hemorrhage (HCC) [S06.6XAA] Traumatic subarachnoid hemorrhage with coma (HCC) [S06.6X9A, R40.20]  Triage Note Pt to er room number 12, states that she was walking and her grocery cart feel off the curb and pulled her over, states that she fell on her L face, pt has dried blood to left face, pt pupils are 4mm and reactive to light, pt oriented times three, states that she is on a blood thinner.    Allergies No Known Allergies  Level of Care/Admitting Diagnosis ED Disposition     ED Disposition  Admit   Condition  --   Comment  Hospital Area: [facility] [[id number]]  Level of Care: Progressive [102]  Admit to Progressive based on following criteria: NEUROLOGICAL AND NEUROSURGICAL complex patients with significant risk of instability, who do not meet ICU criteria, yet require close observation or frequent assessment (< / = every 2 - 4 hours) with medical / nursing intervention.  Covid Evaluation: Asymptomatic - no recent exposure (last 10 days) testing not required  Diagnosis: Traumatic subarachnoid hemorrhage with coma [facility]) [[id number]]  Admitting Physician: Andris Baumann [[id number]]  Attending Physician: Andris Baumann [[id number]]          B Medical/Surgery History Past Medical History:  Diagnosis Date   Anemia    Anginal pain (HCC)    Arthritis    Breast cancer (HCC)    2022 [facility]   Chronic pain syndrome    Degenerative disc disease, lumbar    Edema of left lower extremity    Gastroparesis due to secondary diabetes (HCC)    GERD (gastroesophageal reflux disease)    Heart murmur     History of seizure disorder    Hyperlipidemia    Hypertension    Invasive carcinoma of breast (HCC) 04/25/2021   LEFT; stage 1A; grade I invasive mammary carcinoma; ER/PR (+); HER2/neu (-)   Mild aortic stenosis    Mild mitral regurgitation    Mild tricuspid regurgitation    Morbid obesity (HCC)    Opioid use    therapeutic use for Dx of chronic pain syndrome   OSA (obstructive sleep apnea)    O2 at night   Personal history of radiation therapy    Recurrent falls while walking    uses rolling walker    Requires supplemental oxygen    2L/[place] at bedtime   T2DM (type 2 diabetes mellitus) (HCC)    Tubular adenoma of colon    Past Surgical History:  Procedure Laterality Date   ABDOMINAL HYSTERECTOMY  1991   partial   BREAST BIOPSY Left 0505/2022   u/s bx-"X" clip-INVASIVE MAMMARY CARCINOMA WITH TUBULAR FEATURES   BREAST BIOPSY Right 05/05/2023   [place] Core Bx,-ribbon clip- path pending   BREAST BIOPSY Right 05/05/2023   [place] RT BREAST BX W LOC DEV 1ST LESION IMG BX SPEC US GUIDE 05/05/2023 [facility]   BREAST CYST ASPIRATION Right 07/16/2016   neg   BREAST LUMPECTOMY,RADIO FREQ LOCALIZER,AXILLARY SENTINEL LYMPH NODE BIOPSY Left 05/22/2021   Procedure: BREAST LUMPECTOMY,RADIO FREQ LOCALIZER,AXILLARY SENTINEL LYMPH NODE BIOPSY;  Surgeon: Campbell Lerner, MD;  Location: [facility] ORS;  Service: General;  Laterality: Left;  CARDIOVERSION N/A 02/14/2022   Procedure: CARDIOVERSION;  Surgeon: Antonieta Iba, MD;  Location: [facility] ORS;  Service: Cardiovascular;  Laterality: N/A;  TEE and cardioversion   CHOLECYSTECTOMY     COLONOSCOPY WITH PROPOFOL N/A 07/30/2016   Procedure: COLONOSCOPY WITH PROPOFOL;  Surgeon: Christena Deem, MD;  Location: [facility] ENDOSCOPY;  Service: Endoscopy;  Laterality: N/A;   ESOPHAGOGASTRODUODENOSCOPY (EGD) WITH PROPOFOL N/A 09/09/2017   Procedure: ESOPHAGOGASTRODUODENOSCOPY (EGD) WITH PROPOFOL;  Surgeon: Toledo, Boykin Nearing, MD;  Location: [facility] ENDOSCOPY;  Service:  Endoscopy;  Laterality: N/A;   EYE SURGERY Right    for pterygium   FUNCTIONAL ENDOSCOPIC SINUS SURGERY     TEE WITHOUT CARDIOVERSION N/A 02/14/2022   Procedure: TRANSESOPHAGEAL ECHOCARDIOGRAM (TEE);  Surgeon: Antonieta Iba, MD;  Location: [facility] ORS;  Service: Cardiovascular;  Laterality: N/A;   TONSILLECTOMY       A IV Location/Drains/Wounds Patient Lines/Drains/Airways Status     Active Line/Drains/Airways     Name Placement date Placement time Site Days   Peripheral IV 06/24/23 20 G Left;Posterior Hand 06/24/23  1852  Hand  less than 1   Pressure Injury Buttocks partial thickness skin loss related to moisture associated skin damage, not pressure --  --  -- --            Intake/Output Last 24 hours  Intake/Output Summary (Last 24 hours) at 06/24/2023 2002 Last data filed at 06/24/2023 1947 Gross per 24 hour  Intake 100 ml  Output --  Net 100 ml    Labs/Imaging Results for orders placed or performed during the hospital encounter of 06/24/23 (from the past 48 hour(s))  Protime-INR     Status: Abnormal   Collection Time: 06/24/23  6:49 PM  Result Value Ref Range   Prothrombin Time 15.6 (H) 11.4 - 15.2 seconds   INR 1.2 0.8 - 1.2    Comment: (NOTE) INR goal varies based on device and disease states. Performed at [facility], [address] [place], [place] [address]   APTT     Status: None   Collection Time: 06/24/23  6:49 PM  Result Value Ref Range   aPTT 30 24 - 36 seconds    Comment: Performed at [facility], [address] [place], [place] [address]  Basic metabolic panel     Status: Abnormal   Collection Time: 06/24/23  6:49 PM  Result Value Ref Range   Sodium 137 135 - 145 mmol/L   Potassium 3.7 3.5 - 5.1 mmol/L   Chloride 104 98 - 111 mmol/L   CO2 23 22 - 32 mmol/L   Glucose, Bld 122 (H) 70 - 99 mg/dL    Comment: Glucose reference range applies only to samples taken after fasting for at least 8 hours.   BUN 24 (H) 8 - 23 mg/dL    Creatinine, Ser 2.84 0.44 - 1.00 mg/dL   Calcium 8.8 (L) 8.9 - 10.3 mg/dL   GFR, Estimated >13 >24 mL/min    Comment: (NOTE) Calculated using the CKD-EPI Creatinine Equation (2021)    Anion gap 10 5 - 15    Comment: Performed at [facility], [address] [place], [place] [address]  CBC with Differential     Status: None   Collection Time: 06/24/23  6:49 PM  Result Value Ref Range   WBC 7.0 4.0 - 10.5 K/uL   RBC 4.14 3.87 - 5.11 MIL/uL   Hemoglobin 12.9 12.0 - 15.0 g/dL   HCT 72.5 36.6 - 44.0 %  MCV 96.6 80.0 - 100.0 fL   MCH 31.2 26.0 - 34.0 pg   MCHC 32.3 30.0 - 36.0 g/dL   RDW 45.4 09.8 - 11.9 %   Platelets 159 150 - 400 K/uL   nRBC 0.0 0.0 - 0.2 %   Neutrophils Relative % 69 %   Neutro Abs 4.9 1.7 - 7.7 K/uL   Lymphocytes Relative 18 %   Lymphs Abs 1.3 0.7 - 4.0 K/uL   Monocytes Relative 9 %   Monocytes Absolute 0.6 0.1 - 1.0 K/uL   Eosinophils Relative 2 %   Eosinophils Absolute 0.1 0.0 - 0.5 K/uL   Basophils Relative 1 %   Basophils Absolute 0.0 0.0 - 0.1 K/uL   Immature Granulocytes 1 %   Abs Immature Granulocytes 0.04 0.00 - 0.07 K/uL    Comment: Performed at [facility], [address], [place], [place] [address]   CT HEAD WO CONTRAST ( )  Result Date: 06/24/2023 CLINICAL DATA:  Trauma. EXAM: CT HEAD WITHOUT CONTRAST CT MAXILLOFACIAL WITHOUT CONTRAST CT CERVICAL SPINE WITHOUT CONTRAST TECHNIQUE: Multidetector CT imaging of the head, cervical spine, and maxillofacial structures were performed using the standard protocol without intravenous contrast. Multiplanar CT image reconstructions of the cervical spine and maxillofacial structures were also generated. RADIATION DOSE REDUCTION: This exam was performed according to the departmental dose-optimization program which includes automated exposure control, adjustment of the mA and/or kV according to patient size and/or use of iterative reconstruction technique. COMPARISON:  Head CT dated  02/08/2022. FINDINGS: CT HEAD FINDINGS Brain: The ventricles and sulci are appropriate size for patient's age. The gray-white matter discrimination is preserved. Minimal subarachnoid hemorrhage in the left temporal lobe and possibly within the sylvian fissure. No mass effect midline shift. Vascular: No hyperdense vessel or unexpected calcification. Skull: Normal. Negative for fracture or focal lesion. Other: None CT MAXILLOFACIAL FINDINGS Osseous: No acute fracture.  No mandibular dislocation. Orbits: The globes and retro-orbital fat are preserved. Sinuses: There is diffuse mucoperiosteal thickening of paranasal sinuses with opacification of several ethmoid air cells. No air-fluid level. The mastoid air cells are clear. Soft tissues: Negative. CT CERVICAL SPINE FINDINGS Alignment: No acute subluxation. There is straightening of normal cervical lordosis which may be positional or due to muscle spasm. Skull base and vertebrae: No acute fracture.  Osteopenia. Soft tissues and spinal canal: No prevertebral fluid or swelling. No visible canal hematoma. Disc levels:  No acute findings.  Degenerative changes. Upper chest: Negative. Other: Left carotid bulb calcified plaques. IMPRESSION: 1. Small left temporal subarachnoid hemorrhage. No mass effect or midline shift. 2. No acute/traumatic cervical spine pathology. 3. No acute facial bone fractures. These results were called by telephone at the time of interpretation on 06/24/2023 at 6:40 pm to provider PHILLIP STAFFORD , who verbally acknowledged these results. Electronically Signed   By: Elgie Collard M.D.   On: 06/24/2023 18:42   CT Maxillofacial Wo Contrast  Result Date: 06/24/2023 CLINICAL DATA:  Trauma. EXAM: CT HEAD WITHOUT CONTRAST CT MAXILLOFACIAL WITHOUT CONTRAST CT CERVICAL SPINE WITHOUT CONTRAST TECHNIQUE: Multidetector CT imaging of the head, cervical spine, and maxillofacial structures were performed using the standard protocol without intravenous contrast.  Multiplanar CT image reconstructions of the cervical spine and maxillofacial structures were also generated. RADIATION DOSE REDUCTION: This exam was performed according to the departmental dose-optimization program which includes automated exposure control, adjustment of the mA and/or kV according to patient size and/or use of iterative reconstruction technique. COMPARISON:  Head CT dated 02/08/2022. FINDINGS: CT HEAD  FINDINGS Brain: The ventricles and sulci are appropriate size for patient's age. The gray-white matter discrimination is preserved. Minimal subarachnoid hemorrhage in the left temporal lobe and possibly within the sylvian fissure. No mass effect midline shift. Vascular: No hyperdense vessel or unexpected calcification. Skull: Normal. Negative for fracture or focal lesion. Other: None CT MAXILLOFACIAL FINDINGS Osseous: No acute fracture.  No mandibular dislocation. Orbits: The globes and retro-orbital fat are preserved. Sinuses: There is diffuse mucoperiosteal thickening of paranasal sinuses with opacification of several ethmoid air cells. No air-fluid level. The mastoid air cells are clear. Soft tissues: Negative. CT CERVICAL SPINE FINDINGS Alignment: No acute subluxation. There is straightening of normal cervical lordosis which may be positional or due to muscle spasm. Skull base and vertebrae: No acute fracture.  Osteopenia. Soft tissues and spinal canal: No prevertebral fluid or swelling. No visible canal hematoma. Disc levels:  No acute findings.  Degenerative changes. Upper chest: Negative. Other: Left carotid bulb calcified plaques. IMPRESSION: 1. Small left temporal subarachnoid hemorrhage. No mass effect or midline shift. 2. No acute/traumatic cervical spine pathology. 3. No acute facial bone fractures. These results were called by telephone at the time of interpretation on 06/24/2023 at 6:40 pm to provider PHILLIP STAFFORD , who verbally acknowledged these results. Electronically Signed   By:  Elgie Collard M.D.   On: 06/24/2023 18:42   CT Cervical Spine Wo Contrast  Result Date: 06/24/2023 CLINICAL DATA:  Trauma. EXAM: CT HEAD WITHOUT CONTRAST CT MAXILLOFACIAL WITHOUT CONTRAST CT CERVICAL SPINE WITHOUT CONTRAST TECHNIQUE: Multidetector CT imaging of the head, cervical spine, and maxillofacial structures were performed using the standard protocol without intravenous contrast. Multiplanar CT image reconstructions of the cervical spine and maxillofacial structures were also generated. RADIATION DOSE REDUCTION: This exam was performed according to the departmental dose-optimization program which includes automated exposure control, adjustment of the mA and/or kV according to patient size and/or use of iterative reconstruction technique. COMPARISON:  Head CT dated 02/08/2022. FINDINGS: CT HEAD FINDINGS Brain: The ventricles and sulci are appropriate size for patient's age. The gray-white matter discrimination is preserved. Minimal subarachnoid hemorrhage in the left temporal lobe and possibly within the sylvian fissure. No mass effect midline shift. Vascular: No hyperdense vessel or unexpected calcification. Skull: Normal. Negative for fracture or focal lesion. Other: None CT MAXILLOFACIAL FINDINGS Osseous: No acute fracture.  No mandibular dislocation. Orbits: The globes and retro-orbital fat are preserved. Sinuses: There is diffuse mucoperiosteal thickening of paranasal sinuses with opacification of several ethmoid air cells. No air-fluid level. The mastoid air cells are clear. Soft tissues: Negative. CT CERVICAL SPINE FINDINGS Alignment: No acute subluxation. There is straightening of normal cervical lordosis which may be positional or due to muscle spasm. Skull base and vertebrae: No acute fracture.  Osteopenia. Soft tissues and spinal canal: No prevertebral fluid or swelling. No visible canal hematoma. Disc levels:  No acute findings.  Degenerative changes. Upper chest: Negative. Other: Left  carotid bulb calcified plaques. IMPRESSION: 1. Small left temporal subarachnoid hemorrhage. No mass effect or midline shift. 2. No acute/traumatic cervical spine pathology. 3. No acute facial bone fractures. These results were called by telephone at the time of interpretation on 06/24/2023 at 6:40 pm to provider PHILLIP STAFFORD , who verbally acknowledged these results. Electronically Signed   By: Elgie Collard M.D.   On: 06/24/2023 18:42    Pending Labs Unresulted Labs (From admission, onward)     Start     Ordered   06/25/23 0500  CBC  Tomorrow morning,  R        06/24/23 1934            Vitals/Pain Today's Vitals   06/24/23 1732 06/24/23 1736 06/24/23 1900  BP: 111/63  (!) 112/57  Pulse: 67  (!) 59  Resp: 18  19  Temp: 98.2 F (36.8 C)    TempSrc: Oral    SpO2: 100%  100%  Weight:  96.2 kg   Height:  5\' 4"  (1.626 m)   PainSc:  4      Isolation Precautions No active isolations  Medications Medications  fluorescein ophthalmic strip 1 strip (has no administration in time range)  sodium chloride flush (NS) 0.9 % injection 3 mL (has no administration in time range)  acetaminophen (TYLENOL) tablet 650 mg (has no administration in time range)    Or  acetaminophen (TYLENOL) suppository 650 mg (has no administration in time range)  ondansetron (ZOFRAN) tablet 4 mg (has no administration in time range)    Or  ondansetron (ZOFRAN) injection 4 mg (has no administration in time range)  alum & mag hydroxide-simeth (MAALOX/MYLANTA) 200-200-20 MG/5ML suspension 30 mL (has no administration in time range)  Tdap (BOOSTRIX) injection 0.5 mL (0.5 mLs Intramuscular Given 06/24/23 1846)  acetaminophen (TYLENOL) tablet 1,000 mg (1,000 mg Oral Given 06/24/23 1847)  levETIRAcetam (KEPPRA) IVPB 1000 mg/100 mL premix (0 mg Intravenous Stopped 06/24/23 1947)  coag fact Xa recombinant (ANDEXXA) low dose infusion 900 mg (900 mg Intravenous New Bag/Given 06/24/23 1947)    Mobility walks         R Recommendations: See Admitting Provider Note  Report given to:   Additional Notes:

## 2023-06-24 NOTE — Progress Notes (Signed)
Preliminary Review:   Evaluated CT scan with a small traumatic subarachnoid hemorrhage. Agree with repeat head CT in 6 hours given history of anticoagulation. Agree with keppra for seizure prophylaxis. Full Formal consultation note to follow.

## 2023-06-24 NOTE — ED Triage Notes (Signed)
Pt to er room number 12, states that she was walking and her grocery cart feel off the curb and pulled her over, states that she fell on her L face, pt has dried blood to left face, pt pupils are 4mm and reactive to light, pt oriented times three, states that she is on a blood thinner.

## 2023-06-24 NOTE — Assessment & Plan Note (Deleted)
Holding Eliquis due to fall.  Was refused in the ED

## 2023-06-24 NOTE — ED Notes (Signed)
EDP at bedside  

## 2023-06-24 NOTE — Assessment & Plan Note (Signed)
Followed by cardiology 

## 2023-06-25 ENCOUNTER — Observation Stay: Payer: Medicare HMO

## 2023-06-25 DIAGNOSIS — S066X0A Traumatic subarachnoid hemorrhage without loss of consciousness, initial encounter: Secondary | ICD-10-CM | POA: Diagnosis not present

## 2023-06-25 LAB — CBC
HCT: 38.5 % (ref 36.0–46.0)
Hemoglobin: 12.2 g/dL (ref 12.0–15.0)
MCH: 30.6 pg (ref 26.0–34.0)
MCHC: 31.7 g/dL (ref 30.0–36.0)
MCV: 96.5 fL (ref 80.0–100.0)
Platelets: 141 10*3/uL — ABNORMAL LOW (ref 150–400)
RBC: 3.99 MIL/uL (ref 3.87–5.11)
RDW: 14.1 % (ref 11.5–15.5)
WBC: 6 10*3/uL (ref 4.0–10.5)
nRBC: 0 % (ref 0.0–0.2)

## 2023-06-25 LAB — GLUCOSE, CAPILLARY: Glucose-Capillary: 107 mg/dL — ABNORMAL HIGH (ref 70–99)

## 2023-06-25 MED ORDER — LEVETIRACETAM 500 MG PO TABS: 500.0000 mg | ORAL_TABLET | Freq: Two times a day (BID) | ORAL | 0 refills | Status: DC

## 2023-06-25 MED ORDER — APIXABAN 5 MG PO TABS: 5.0000 mg | ORAL_TABLET | Freq: Two times a day (BID) | ORAL | 3 refills | Status: AC

## 2023-06-25 NOTE — TOC Transition Note (Signed)
Transition of Care [facility]) - CM/SW Discharge Note   Patient Details  Name: Rhonda Thornton MRN: [id number] Date of Birth: 05/08/1951  [facility] CM/SW Contact:  Truddie Hidden, RN Phone Number: 06/25/2023, 2:59 PM   Clinical Narrative:    Spoke with patient regarding discharge plan for today. [name] stated she was previously active with HH via [facility] and would like services with them again. Patient advised  HH would contact her directly to schedule an appointment.  Her brother and sister in law will transport her home.   Spoke with from [place] from [place]. Patient referral accepted with SOC within 24 hours.   TOC signing off.       Patient Goals and CMS Choice      Discharge Placement                         Discharge Plan and Services Additional resources added to the After Visit Summary for                                       Social Determinants of Health (SDOH) Interventions SDOH Screenings   Food Insecurity: No Food Insecurity (06/24/2023)  Housing: Low Risk  (06/24/2023)  Transportation Needs: No Transportation Needs (06/24/2023)  Utilities: Not At Risk (06/24/2023)  Tobacco Use: Low Risk  (06/24/2023)     Readmission Risk Interventions    02/15/2022    4:13 PM  Readmission Risk Prevention Plan  Transportation Screening Complete  PCP or Specialist Appt within 5-7 Days Complete  Home Care Screening Complete  Medication Review (RN CM) Complete

## 2023-06-25 NOTE — Evaluation (Signed)
Physical Therapy Evaluation Patient Details Name: Rhonda Thornton MRN: [id number] DOB: [birth year] Today's Date: 06/25/2023  History of Present Illness  72 y.o. female with medical history significant of DM, HTN, HFpEF, nonrheumatic aortic valve stenosis, atrial fibrillation on Eliquis and amiodarone, and sleep apnea who presented to the ED following a accidental fall sustained when she accidentally pushed her shopping cart off the curb causing her to fall down sustaining impact to the left face.  Clinical Impression  Pt pleasant and eager to work with PT.  She was able to perform bed mobility, get up from standard height bed and from low commode w/o assist and despite forward flexed posture and L limp (both baseline) she was able to ambulate >100 ft.  Pt did have one small episode of buckling that she self arrested with the walker, PT encouraged her to use walker as opposed to 4WW due to her forward flexed/leaning posture.  Pt reports that family usually only stops by monthly with groceries, but could be around a little more when she gets home to help out PRN.   Pt will benefit from continued PT to address functional limitations and insure safe transition home.        Assistance Recommended at Discharge Intermittent Supervision/Assistance  If plan is discharge home, recommend the following:  Can travel by private vehicle  Help with stairs or ramp for entrance;Assist for transportation;Assistance with cooking/housework        Equipment Recommendations None recommended by PT  Recommendations for Other Services       Functional Status Assessment Patient has had a recent decline in their functional status and demonstrates the ability to make significant improvements in function in a reasonable and predictable amount of time.     Precautions / Restrictions Precautions Precautions: Fall Restrictions Weight Bearing Restrictions: No      Mobility  Bed Mobility Overal bed mobility: Modified  Independent                  Transfers Overall transfer level: Needs assistance Equipment used: Rolling walker (2 wheels) Transfers: Sit to/from Stand Sit to Stand: Min guard           General transfer comment: cuing for appropriate UE placement/use, pt was able to get to standing w/o direct assist, plenty of cuing to insure appropriate transition    Ambulation/Gait Ambulation/Gait assistance: Min guard Gait Distance (Feet): 125 Feet Assistive device: Rolling walker (2 wheels)         General Gait Details: Pt with forward flexed posture, heavy lean on the walker, consistent L LE limp that she reports as essentially her baseline.  She did have one small L knee buckle that she self corrected but otherwise was able to maintain a resonably safe and consistent (albeit limping) gait.  Pt's O2 remained in the 90s and HR stayed <100 t/o the effort.  Pt endorses some fatigue at ~100 ft.  Repeated cuing needed to try and stay inside the walker.  Stairs            Wheelchair Mobility     Tilt Bed    Modified Rankin (Stroke Patients Only)       Balance Overall balance assessment: Needs assistance Sitting-balance support: No upper extremity supported Sitting balance-Leahy Scale: Good     Standing balance support: Bilateral upper extremity supported Standing balance-Leahy Scale: Fair Standing balance comment: Pt with forward lean at baseline and was therefore reliant on the walker with standing tasks, able to straighten some  with cuing but ultimately needing walker to keep upright.  No LOBs but one L LE buckling episode that she self arrested with the walker.                             Pertinent Vitals/Pain Pain Assessment Pain Assessment: No/denies pain    Home Living Family/patient expects to be discharged to:: Private residence Living Arrangements: Alone     Home Access: Ramped entrance       Home Layout: One level Home Equipment: Clinical biochemist (2 wheels)      Prior Function Prior Level of Function : Independent/Modified Independent             Mobility Comments: Pt reports that she gets out of the house about 2x/month to go to [place] - does drive occasionally, uses RW in community, 4WW in the home ADLs Comments: family does grocery shopping, stops in monthly, mostly warms up frozen meals, able to manage only light house work     Higher education careers adviser        Extremity/Trunk Assessment   Upper Extremity Assessment Upper Extremity Assessment: Generalized weakness    Lower Extremity Assessment Lower Extremity Assessment: Generalized weakness       Communication   Communication: No difficulties  Cognition Arousal/Alertness:  (reports L facial injury is not painful) Behavior During Therapy: WFL for tasks assessed/performed Overall Cognitive Status: Within Functional Limits for tasks assessed                                          General Comments      Exercises     Assessment/Plan    PT Assessment Patient needs continued PT services  PT Problem List Decreased strength;Decreased range of motion;Decreased activity tolerance;Decreased balance;Decreased mobility;Decreased knowledge of use of DME;Decreased safety awareness       PT Treatment Interventions Gait training;DME instruction;Functional mobility training;Therapeutic activities;Therapeutic exercise;Balance training;Neuromuscular re-education;Patient/family education    PT Goals (Current goals can be found in the Care Plan section)  Acute Rehab PT Goals Patient Stated Goal: go home PT Goal Formulation: With patient Time For Goal Achievement: 07/08/23 Potential to Achieve Goals: Good    Frequency Min 3X/week     Co-evaluation               AM-PAC PT "6 Clicks" Mobility  Outcome Measure Help needed turning from your back to your side while in a flat bed without using bedrails?: None Help needed moving from lying on your  back to sitting on the side of a flat bed without using bedrails?: None Help needed moving to and from a bed to a chair (including a wheelchair)?: None Help needed standing up from a chair using your arms (e.g., wheelchair or bedside chair)?: None Help needed to walk in hospital room?: A Little Help needed climbing 3-5 steps with a railing? : A Lot 6 Click Score: 21    End of Session Equipment Utilized During Treatment: Gait belt Activity Tolerance: Patient tolerated treatment well;Patient limited by fatigue Patient left: with chair alarm set;with call bell/phone within reach Nurse Communication: Mobility status PT Visit Diagnosis: Muscle weakness (generalized) (M62.81);Difficulty in walking, not elsewhere classified (R26.2);History of falling (Z91.81)    Time: 4098-1191 PT Time Calculation (min) (ACUTE ONLY): 25 min   Charges:   PT Evaluation $PT Eval Low Complexity: 1  Low PT Treatments $Gait Training: 8-22 mins PT General Charges $$ ACUTE PT VISIT: 1 Visit         Malachi Pro, DPT 06/25/2023, 11:07 AM

## 2023-06-25 NOTE — Evaluation (Signed)
Occupational Therapy Evaluation Patient Details Name: Rhonda Thornton MRN: [id number] DOB: 10-11-1951 Today's Date: 06/25/2023   History of Present Illness 72 y.o. female with medical history significant of DM, HTN, HFpEF, nonrheumatic aortic valve stenosis, atrial fibrillation on Eliquis and amiodarone, and sleep apnea who presented to the ED following a accidental fall sustained when she accidentally pushed her shopping cart off the curb causing her to fall down sustaining impact to the left face.   Clinical Impression   Pt seen for OT evaluation this date.  Pt lives alone in a mobile home with a ramped entrance.  She has a walker, rollator and elevated toilet.  She takes sponge baths at baseline.  She normally has her niece and a nephew who check in on her a couple times a month and do her grocery shopping for her.  She reported her nephew was on vacation and she decided to try going to the grocery store (she hasn't been on her own to the store in over a year and a 1/2) and she fell while trying to push a cart of her groceries up the ramp to her home.  She reports she is at her baseline level of care for daily tasks.  She is able to demonstrate the ability to perform basic self care tasks but reports daily tasks are becoming more difficult over time.  She would benefit from skilled OT services to maximize her safety and independence in necessary daily tasks.       Recommendations for follow up therapy are one component of a multi-disciplinary discharge planning process, led by the attending physician.  Recommendations may be updated based on patient status, additional functional criteria and insurance authorization.   Assistance Recommended at Discharge Intermittent Supervision/Assistance  Patient can return home with the following Assistance with cooking/housework;Assist for transportation;A little help with bathing/dressing/bathroom    Functional Status Assessment  Patient has had a recent  decline in their functional status and demonstrates the ability to make significant improvements in function in a reasonable and predictable amount of time.  Equipment Recommendations       Recommendations for Other Services       Precautions / Restrictions Precautions Precautions: Fall Restrictions Weight Bearing Restrictions: No      Mobility Bed Mobility                    Transfers Overall transfer level: Needs assistance Equipment used: Rolling walker (2 wheels) Transfers: Sit to/from Stand Sit to Stand: Supervision           General transfer comment: increased time allowed for transfers and mobility.      Balance Overall balance assessment: Needs assistance Sitting-balance support: No upper extremity supported Sitting balance-Leahy Scale: Good     Standing balance support: Bilateral upper extremity supported Standing balance-Leahy Scale: Fair Standing balance comment: Pt with forward lean at baseline and was therefore reliant on the walker with standing tasks, able to straighten some with cuing but ultimately needing walker to keep upright.                           ADL either performed or assessed with clinical judgement   ADL Overall ADL's : At baseline                                       General ADL  Comments: Pt appears at her baseline level of care, able to demonstrate toileting, dressing. She does sponge baths at baseline.     Vision         Perception     Praxis      Pertinent Vitals/Pain Pain Assessment Pain Assessment: No/denies pain     Hand Dominance Right   Extremity/Trunk Assessment Upper Extremity Assessment Upper Extremity Assessment: Generalized weakness   Lower Extremity Assessment Lower Extremity Assessment: Defer to PT evaluation   Cervical / Trunk Assessment Cervical / Trunk Assessment: Kyphotic   Communication Communication Communication: No difficulties   Cognition  Arousal/Alertness: Awake/alert Behavior During Therapy: WFL for tasks assessed/performed Overall Cognitive Status: Within Functional Limits for tasks assessed                                       General Comments       Exercises     Shoulder Instructions      Home Living Family/patient expects to be discharged to:: Private residence Living Arrangements: Alone Available Help at Discharge: Family;Available PRN/intermittently Type of Home: Mobile home Home Access: Ramped entrance     Home Layout: One level     Bathroom Shower/Tub: Tub/shower unit;Walk-in shower;Sponge bathes at baseline   Bathroom Toilet: Handicapped height     Home Equipment: Agricultural consultant (2 wheels)   Additional Comments: Pt reports she had not tried to go to the grocery store in the last year and 1/2 but her nephew was out of town and she felt she needed a few things so she tried going on her own (which is out of her routine) when she fell.  She was trying to get her groceries up the ramp of her home when she fell.  She has been able to warm up food and doing frozen meals, light homemaking, laundry.  She does not go out much, only to [place] appts.  She reports she drives occasionally.  She is able to ambulate with a walker but has a forward flexed, stooped posture.  She would benefit from [facility] for a home safety evaluation and to ensure her set up for self care is optimal.  She would also likely benefit from a home health aide to help her with getting a shower a few times a week and for homemaking tasks.      Prior Functioning/Environment Prior Level of Function : Independent/Modified Independent             Mobility Comments: Pt reports that she gets out of the house about 2x/month to go to [place] - does drive occasionally, uses RW in community, 4WW in the home ADLs Comments: family does grocery shopping, stops in monthly, mostly warms up frozen meals, able to manage only light house work         OT Problem List: Decreased strength;Impaired balance (sitting and/or standing);Decreased activity tolerance      OT Treatment/Interventions: Self-care/ADL training;DME and/or AE instruction;Therapeutic activities;Balance training;Therapeutic exercise;Patient/family education    OT Goals(Current goals can be found in the care plan section) Acute Rehab OT Goals Patient Stated Goal: to go home and be able to care for myself OT Goal Formulation: With patient Time For Goal Achievement: 07/04/23 Potential to Achieve Goals: Good ADL Goals Pt Will Perform Lower Body Bathing: with modified independence Pt Will Transfer to Toilet: with modified independence  OT Frequency: Min 2X/week    Co-evaluation  AM-PAC OT "6 Clicks" Daily Activity     Outcome Measure Help from another person eating meals?: None Help from another person taking care of personal grooming?: None Help from another person toileting, which includes using toliet, bedpan, or urinal?: None Help from another person bathing (including washing, rinsing, drying)?: A Little Help from another person to put on and taking off regular upper body clothing?: None Help from another person to put on and taking off regular lower body clothing?: None 6 Click Score: 23   End of Session Equipment Utilized During Treatment: Gait belt;Rolling walker (2 wheels)  Activity Tolerance: Patient tolerated treatment well Patient left: in chair;with call bell/phone within reach;with chair alarm set  OT Visit Diagnosis: Unsteadiness on feet (R26.81);Repeated falls (R29.6);Muscle weakness (generalized) (M62.81);History of falling (Z91.81)                Time: 1333-1400 OT Time Calculation (min): 27 min Charges:  OT General Charges $OT Visit: 1 Visit OT Evaluation $OT Eval Low Complexity: 1 Low OT Treatments $Self Care/Home Management : 8-22 mins  Rekha Hobbins T Rotha Cassels, OTR/L, CLT  Garrie Elenes 06/25/2023, 2:40 PM

## 2023-06-25 NOTE — Consult Note (Signed)
Consulting Department:  Inpatient medicine  Primary Physician:  Hillery Aldo, MD  Chief Complaint: Fall  History of Present Illness: 06/25/2023 Rhonda Thornton is a 72 y.o. female who presents with the chief complaint of a recent fall.  She has a history of Eliquis for A-fib.  She was in her usual state of health and unfortunately fell off of a curb with a cart of groceries.  She did not lose consciousness.  She has not noticed any clear fluid drainage.  She does live alone.  She has not had any seizures.  No weakness numbness or tingling.  No trouble with her speech or understanding.  No headaches currently.  Had reversal yesterday in the ER, and had Keppra initiated.  Review of Systems:  A 10 point review of systems is negative, except for the pertinent positives and negatives detailed in the HPI.  Past Medical History: Past Medical History:  Diagnosis Date   Anemia    Anginal pain (HCC)    Arthritis    Breast cancer (HCC)    2022 [facility]   Chronic pain syndrome    Degenerative disc disease, lumbar    Edema of left lower extremity    Gastroparesis due to secondary diabetes (HCC)    GERD (gastroesophageal reflux disease)    Heart murmur    History of seizure disorder    Hyperlipidemia    Hypertension    Invasive carcinoma of breast (HCC) 04/25/2021   LEFT; stage 1A; grade I invasive mammary carcinoma; ER/PR (+); HER2/neu (-)   Mild aortic stenosis    Mild mitral regurgitation    Mild tricuspid regurgitation    Morbid obesity (HCC)    Opioid use    therapeutic use for Dx of chronic pain syndrome   OSA (obstructive sleep apnea)    O2 at night   Personal history of radiation therapy    Recurrent falls while walking    uses rolling walker    Requires supplemental oxygen    2L/[place] at bedtime   T2DM (type 2 diabetes mellitus) (HCC)    Tubular adenoma of colon     Past Surgical History: Past Surgical History:  Procedure Laterality Date   ABDOMINAL HYSTERECTOMY  1991   partial    BREAST BIOPSY Left 0505/2022   u/s bx-"X" clip-INVASIVE MAMMARY CARCINOMA WITH TUBULAR FEATURES   BREAST BIOPSY Right 05/05/2023   [place] Core Bx,-ribbon clip- path pending   BREAST BIOPSY Right 05/05/2023   [place] RT BREAST BX W LOC DEV 1ST LESION IMG BX SPEC US GUIDE 05/05/2023 [facility]   BREAST CYST ASPIRATION Right 07/16/2016   neg   BREAST LUMPECTOMY,RADIO FREQ LOCALIZER,AXILLARY SENTINEL LYMPH NODE BIOPSY Left 05/22/2021   Procedure: BREAST LUMPECTOMY,RADIO FREQ LOCALIZER,AXILLARY SENTINEL LYMPH NODE BIOPSY;  Surgeon: Campbell Lerner, MD;  Location: [facility];  Service: General;  Laterality: Left;   CARDIOVERSION N/A 02/14/2022   Procedure: CARDIOVERSION;  Surgeon: Antonieta Iba, MD;  Location: [facility];  Service: Cardiovascular;  Laterality: N/A;  TEE and cardioversion   CHOLECYSTECTOMY     COLONOSCOPY WITH PROPOFOL N/A 07/30/2016   Procedure: COLONOSCOPY WITH PROPOFOL;  Surgeon: Christena Deem, MD;  Location: [facility];  Service: Endoscopy;  Laterality: N/A;   ESOPHAGOGASTRODUODENOSCOPY (EGD) WITH PROPOFOL N/A 09/09/2017   Procedure: ESOPHAGOGASTRODUODENOSCOPY (EGD) WITH PROPOFOL;  Surgeon: Toledo, Boykin Nearing, MD;  Location: [facility] END[facility];  Service: Endoscopy;  Laterality: N/A;   EYE SURGERY Right    for pterygium   FUNCTIONAL ENDOSCOPIC SINUS SURGERY  TEE WITHOUT CARDIOVERSION N/A 02/14/2022   Procedure: TRANSESOPHAGEAL ECHOCARDIOGRAM (TEE);  Surgeon: Antonieta Iba, MD;  Location: [facility];  Service: Cardiovascular;  Laterality: N/A;   TONSILLECTOMY      Allergies: Allergies as of 06/24/2023   (No Known Allergies)    Medications:  Current Facility-Administered Medications:    acetaminophen (TYLENOL) tablet 650 mg, 650 mg, Oral, Q6H PRN, 650 mg at 06/24/23 2124 **OR** acetaminophen (TYLENOL) suppository 650 mg, 650 mg, Rectal, Q6H PRN, Andris Baumann, MD   alum & mag hydroxide-simeth (MAALOX/MYLANTA) 200-200-20 MG/5ML suspension 30 mL, 30 mL, Oral, Q6H  PRN, Andris Baumann, MD   amiodarone (PACERONE) tablet 200 mg, 200 mg, Oral, Daily, Lindajo Royal V, MD   atorvastatin (LIPITOR) tablet 80 mg, 80 mg, Oral, QHS, Lindajo Royal V, MD, 80 mg at 06/24/23 2204   calcium carbonate (TUMS - dosed in mg elemental calcium) chewable tablet 1,000 mg, 1,000 mg, Oral, TID PRN, Andris Baumann, MD   diltiazem (CARDIZEM CD) 24 hr capsule 240 mg, 240 mg, Oral, Daily, Lindajo Royal V, MD   docusate sodium (COLACE) capsule 100 mg, 100 mg, Oral, QHS, Lindajo Royal V, MD, 100 mg at 06/24/23 2204   dorzolamide-timolol (COSOPT) 2-0.5 % ophthalmic solution 1 drop, 1 drop, Both Eyes, BID, Lindajo Royal V, MD, 1 drop at 06/24/23 2203   DULoxetine (CYMBALTA) DR capsule 60 mg, 60 mg, Oral, Daily, Lindajo Royal V, MD   empagliflozin (JARDIANCE) tablet 10 mg, 10 mg, Oral, Daily, Lindajo Royal V, MD   ferrous sulfate tablet 325 mg, 325 mg, Oral, Daily, Lindajo Royal V, MD   fluorescein ophthalmic strip 1 strip, 1 strip, Left Eye, Once, Sharman Cheek, MD   latanoprost (XALATAN) 0.005 % ophthalmic solution 1 drop, 1 drop, Both Eyes, QHS, Para March, Hazel V, MD, 1 drop at 06/24/23 2204   letrozole [facility]) tablet 2.5 mg, 2.5 mg, Oral, Daily, Andris Baumann, MD   levETIRAcetam (KEPPRA) tablet 500 mg, 500 mg, Oral, BID, Andris Baumann, MD   mirabegron ER (MYRBETRIQ) tablet 50 mg, 50 mg, Oral, Daily, Para March, Odetta Pink, MD   ondansetron (ZOFRAN) tablet 4 mg, 4 mg, Oral, Q6H PRN **OR** ondansetron (ZOFRAN) injection 4 mg, 4 mg, Intravenous, Q6H PRN, Andris Baumann, MD   pregabalin (LYRICA) capsule 100 mg, 100 mg, Oral, TID, Lindajo Royal V, MD, 100 mg at 06/24/23 2204   sodium chloride flush (NS) 0.9 % injection 3 mL, 3 mL, Intravenous, Q12H, Andris Baumann, MD   traMADol Janean Sark) tablet 50 mg, 50 mg, Oral, BID PRN, Andris Baumann, MD   Social History: Social History   Tobacco Use   Smoking status: Never   Smokeless tobacco: Never  Vaping Use   Vaping Use: Never used   Substance Use Topics   Alcohol use: No   Drug use: Never    Family Medical History: Family History  Problem Relation Age of Onset   Stomach cancer Mother    Colon cancer Brother    Breast cancer Neg Hx     Physical Examination: Vitals:   06/25/23 0409 06/25/23 0752  BP: (!) 109/58 113/64  Pulse: (!) 56 (!) 58  Resp: 18 18  Temp: 97.7 F (36.5 C) 98.4 F (36.9 C)  SpO2: 97% 98%     General: Patient is well developed, well nourished, calm, collected, and in no apparent distress.  She does have a left-sided Perry orbital ecchymosis.  No evidence of clear fluid drainage from her ear or nares  NEUROLOGICAL:  General: In no acute distress.   Awake, alert, oriented to person, place, and time.  Pupils equal round and reactive to light.  Full Facial tone is symmetric.  Tongue protrusion is midline.  Bilateral upper extremities are full strength proximally and distally.  There is no pronator drift.  Language is conversant.  GCS:15   Bilateral upper and lower extremity sensation is intact to light touch.  Imaging: Narrative & Impression  CLINICAL DATA:  Trauma.   EXAM: CT HEAD WITHOUT CONTRAST   CT MAXILLOFACIAL WITHOUT CONTRAST   CT CERVICAL SPINE WITHOUT CONTRAST   TECHNIQUE: Multidetector CT imaging of the head, cervical spine, and maxillofacial structures were performed using the standard protocol without intravenous contrast. Multiplanar CT image reconstructions of the cervical spine and maxillofacial structures were also generated.   RADIATION DOSE REDUCTION: This exam was performed according to the departmental dose-optimization program which includes automated exposure control, adjustment of the mA and/or kV according to patient size and/or use of iterative reconstruction technique.   COMPARISON:  Head CT dated 02/08/2022.   FINDINGS: CT HEAD FINDINGS   Brain: The ventricles and sulci are appropriate size for patient's age. The gray-white matter  discrimination is preserved. Minimal subarachnoid hemorrhage in the left temporal lobe and possibly within the sylvian fissure. No mass effect midline shift.   Vascular: No hyperdense vessel or unexpected calcification.   Skull: Normal. Negative for fracture or focal lesion.   Other: None   CT MAXILLOFACIAL FINDINGS   Osseous: No acute fracture.  No mandibular dislocation.   Orbits: The globes and retro-orbital fat are preserved.   Sinuses: There is diffuse mucoperiosteal thickening of paranasal sinuses with opacification of several ethmoid air cells. No air-fluid level. The mastoid air cells are clear.   Soft tissues: Negative.   CT CERVICAL SPINE FINDINGS   Alignment: No acute subluxation. There is straightening of normal cervical lordosis which may be positional or due to muscle spasm.   Skull base and vertebrae: No acute fracture.  Osteopenia.   Soft tissues and spinal canal: No prevertebral fluid or swelling. No visible canal hematoma.   Disc levels:  No acute findings.  Degenerative changes.   Upper chest: Negative.   Other: Left carotid bulb calcified plaques.   IMPRESSION: 1. Small left temporal subarachnoid hemorrhage. No mass effect or midline shift. 2. No acute/traumatic cervical spine pathology. 3. No acute facial bone fractures.   These results were called by telephone at the time of interpretation on 06/24/2023 at 6:40 pm to provider PHILLIP STAFFORD , who verbally acknowledged these results.     Electronically Signed   By: Elgie Collard M.D.   On: 06/24/2023 18:42   Narrative & Impression  CLINICAL DATA:  Hemorrhage follow-up   EXAM: CT HEAD WITHOUT CONTRAST   TECHNIQUE: Contiguous axial images were obtained from the base of the skull through the vertex without intravenous contrast.   RADIATION DOSE REDUCTION: This exam was performed according to the departmental dose-optimization program which includes automated exposure control,  adjustment of the mA and/or kV according to patient size and/or use of iterative reconstruction technique.   COMPARISON:  06/24/2023   FINDINGS: Brain: Small focus of subarachnoid hemorrhage over the left temporal lobe is unchanged.   Vascular: No abnormal hyperdensity of the major intracranial arteries or dural venous sinuses. No intracranial atherosclerosis.   Skull: The visualized skull base, calvarium and extracranial soft tissues are normal.   Sinuses/Orbits: Chronic maxillary and ethmoid sinus disease. The orbits are normal.  IMPRESSION: Unchanged small focus of subarachnoid hemorrhage over the left temporal lobe.     Electronically Signed   By: Deatra Robinson M.D.   On: 06/25/2023 01:09    I have personally reviewed the images and agree with the above interpretation.  Labs:    Latest Ref Rng & Units 06/25/2023    3:22 AM 06/24/2023    6:49 PM 03/10/2022    5:39 PM  CBC  WBC 4.0 - 10.5 K/uL 6.0  7.0  6.7   Hemoglobin 12.0 - 15.0 g/dL 16.1  09.6  8.6   Hematocrit 36.0 - 46.0 % 38.5  40.0  29.2   Platelets 150 - 400 K/uL 141  159  257        Assessment and Plan: Ms. Hillsman is a pleasant 72 y.o. female with a fall without loss of consciousness.  She fell off a curb and striking left side of her face.  She did not lose consciousness.  She was on Eliquis for A-fib.  She had a head CT which demonstrated a traumatic subarachnoid hemorrhage without any compressive nature or skull fractures.  At this point she is GCS 15 with no focal deficits.  She does have periorbital ecchymosis with no evidence of CSF leak at this time.     Traumatic SAH Recommendations:  Repeat head CT stable, no need for further imaging unless mental status/neurologic status changes Agree with seizure prophylaxis x1 week Hold anticoagulation/antiplatelet medication for 1 week  I have discussed the condition with the patient, including showing the radiographs and discussing treatment options in  layman's terms.  The patient may benefit from conservative management.  We will plan to arrange follow-up in the neurosurgery clinic as an outpatient in 2-4 weeks.    Lovenia Kim, MD/MSCR Dept. of Neurosurgery

## 2023-06-25 NOTE — Discharge Summary (Signed)
[facility] Discharge Summary   Patient: Rhonda Thornton ZOX:[id number]  PCP: Hillery Aldo, MD  Date of admission: 06/24/2023   Date of discharge:  06/25/2023     Discharge Diagnoses:  Principal Problem:   Traumatic subarachnoid hemorrhage secondary to accidental fall [facility]) Active Problems:   Facial contusion, initial encounter   Accidental fall   Chronic anticoagulation   Paroxysmal atrial fibrillation (HCC)   Carcinoma of upper-outer quadrant of female breast, left (HCC)   Essential (primary) hypertension   Sleep apnea   Chronic heart failure with preserved ejection fraction (HCC)   Chronic respiratory failure with hypoxia (HCC)   Depression   Nonrheumatic aortic valve stenosis   T2DM (type 2 diabetes mellitus) (HCC)   Admitted From: Home Disposition:  Home with [facility]  Recommendations for Outpatient Follow-up:  Follow-up with PCP in 1 week.  Hold Eliquis and aspirin for 1 week and then resume if no changes in the mental status.  Continue Keppra 500 mg p.o. twice daily for 7 days. Follow with neurosurgery in 2 to 4 weeks Follow up LABS/TEST:     Follow-up Information     Hillery Aldo, MD Follow up in 1 week(s).   Specialty: Family Medicine Contact information: [address] [place] [address] [phone]         Loreen Freud, MD Follow up in 2 week(s).   Specialty: Neurosurgery Contact information: [address] [place] [address] [phone]                Diet recommendation: Cardiac diet  Activity: The patient is advised to gradually reintroduce usual activities, as tolerated  Discharge Condition: stable  Code Status: Full code   History of present illness: As per the H and P dictated on admission Hospital Course:  Rhonda Thornton is a 72 y.o. female with medical history significant of DM, HTN, HFpEF, nonrheumatic aortic valve stenosis, atrial fibrillation on Eliquis and amiodarone, and sleep apnea who presented  to the ED following a accidental fall sustained when she accidentally pushed her shopping cart off the curb causing her to fall down sustaining impact to the left face.  She did not lose consciousness. Denies pain elsewhere ED course and data review: Vitals within normal limits and blood work unremarkable. EKG, personally viewed and interpreted showing sinus at 66 with borderline prolonged QT interval and no ischemic ST-T wave changes. CT head, C-spine and maxillofacial personally reviewed with findings outlined on official report of "small left temporal subarachnoid hemorrhage without mass effect or midline.  No acute facial bone fractures" The ED provider spoke with on-call neurosurgeon, who recommended follow-up CT in 6 hours, IV Keppra prophylaxis and Eliquis reversal and admission for overnight observation.  Will see in the a.m.   Patient received IV Keppra, Eliquis reversal with [employer].  Hospitalist consulted for admission.   Assessment and Plan: # Traumatic subarachnoid hemorrhage secondary to accidental fall Facial contusion, with no facial bone injuries Started Keppra 500 twice daily, continu Keppra for 7 days as per neurosurgery for seizure prophylaxis.  Repeat CT head at 1 AM on 06/25/2023--->"Unchanged small focus of subarachnoid hemorrhage over the left temporal lobe" no neurological changes on exam.  Patient was seen by neurosurgery in the morning and cleared for discharge and follow-up as an outpatient.  Neurosurgery recommended to stop aspirin and DOAC for 1 week.  Follow-up in 2 to 4 weeks. # Paroxysmal atrial fibrillation: Continue amiodarone.  Hold Eliquis for 1 week due to [facility]  as above.  Follow with PCP and neurosurgery as an outpatient.   # Nonrheumatic aortic valve stenosis, Followed by cardiology # Depression, Continue duloxetine # Chronic respiratory failure with hypoxia: OSA on CPAP and continue supplemental O2 inhalation prn  # Chronic heart failure with preserved ejection  fraction: Clinically euvolemic. Continue furosemide, diltiazem # Essential (primary) hypertension: Continue home meds diltiazem # Carcinoma of upper-outer quadrant of female breast, left: Continue Femara  Body mass index is 36.39 kg/m.  Nutrition Interventions:  Pressure Injury Buttocks partial thickness skin loss related to moisture associated skin damage, not pressure (Active)     Location: Buttocks  Location Orientation:   Staging:   Wound Description (Comments): partial thickness skin loss related to moisture associated skin damage, not pressure  Present on Admission:     - Patient was instructed, not to drive, operate heavy machinery, perform activities at heights, swimming or participation in water activities or provide baby sitting services while on Pain, Sleep and Anxiety Medications; until her outpatient Physician has advised to do so again.  - Also recommended to not to take more than prescribed Pain, Sleep and Anxiety Medications.  Patient was seen by physical therapy, who recommended Home health, which was arranged. On the day of the discharge the patient's vitals were stable, and no other acute medical condition were reported by patient. the patient was felt safe to be discharge at Home with Home health.  Consultants: Neurosurgery Procedures: None  Discharge Exam: General: Appear in no distress, no Rash; Oral Mucosa Clear, moist. Cardiovascular: S1 and S2 Present, no Murmur, Respiratory: normal respiratory effort, Bilateral Air entry present and no Crackles, no wheezes Abdomen: Bowel Sound present, Soft and no tenderness, no hernia Extremities: no Pedal edema, no calf tenderness Neurology: alert and oriented to time, place, and person affect appropriate.  Filed Weights   06/24/23 1736  Weight: 96.2 kg   Vitals:   06/25/23 0752 06/25/23 1114  BP: 113/64 109/62  Pulse: (!) 58 61  Resp: 18 16  Temp: 98.4 F (36.9 C) (!) 97.5 F (36.4 C)  SpO2: 98% 95%     DISCHARGE MEDICATION: Allergies as of 06/25/2023   No Known Allergies      Medication List     STOP taking these medications    polyethylene glycol 17 g packet Commonly known as: MIRALAX / GLYCOLAX   REFRESH OP       TAKE these medications    acetaminophen 325 MG tablet Commonly known as: TYLENOL Take 650 mg by mouth 2 (two) times daily.   alum & mag hydroxide-simeth 200-200-20 MG/5ML suspension Commonly known as: MAALOX/MYLANTA Take 30 mLs by mouth every 6 (six) hours as needed for indigestion or heartburn.   amiodarone 200 MG tablet Commonly known as: PACERONE Take 1 tablet (200 mg total) by mouth daily.   apixaban 5 MG Tabs tablet Commonly known as: ELIQUIS Take 1 tablet (5 mg total) by mouth 2 (two) times daily. Hold for 1 wk Start taking on: July 02, 2023 What changed:  additional instructions These instructions start on July 02, 2023. If you are unsure what to do until then, ask your doctor or other care provider.   aspirin EC 81 MG tablet Take 2 tablets (162 mg total) by mouth at bedtime. Swallow whole. Hold for 1 wk Start taking on: July 02, 2023 What changed:  additional instructions These instructions start on July 02, 2023. If you are unsure what to do until then, ask your doctor or other care  provider.   atorvastatin 80 MG tablet Commonly known as: LIPITOR Take 80 mg by mouth daily.   calcium carbonate 500 MG chewable tablet Commonly known as: TUMS - dosed in mg elemental calcium Chew 1,000 mg by mouth 3 (three) times daily as needed for indigestion or heartburn.   cetirizine 10 MG tablet Commonly known as: ZYRTEC Take 10 mg by mouth daily.   cyanocobalamin 1000 MCG tablet Commonly known as: VITAMIN B12 Take 1,000 mcg by mouth daily.   Daily-Vite Multivitamin Tabs Take 1 tablet by mouth daily.   diltiazem 240 MG 24 hr capsule Commonly known as: CARDIZEM CD Take 1 capsule (240 mg total) by mouth daily.   dorzolamide-timolol 2-0.5 %  ophthalmic solution Commonly known as: COSOPT Place 1 drop into both eyes 2 (two) times daily.   DULoxetine 60 MG capsule Commonly known as: CYMBALTA Take 60 mg by mouth daily.   empagliflozin 10 MG Tabs tablet Commonly known as: JARDIANCE Take 10 mg by mouth daily.   ferrous sulfate 325 (65 FE) MG tablet Take 325 mg by mouth daily.   fluticasone 50 MCG/ACT nasal spray Commonly known as: FLONASE Place 2 sprays into both nostrils daily.   furosemide 20 MG tablet Commonly known as: LASIX Take 20 mg by mouth daily.   latanoprost 0.005 % ophthalmic solution Commonly known as: XALATAN Place 1 drop into both eyes at bedtime.   letrozole 2.5 MG tablet Commonly known as: FEMARA TAKE 1 TABLET BY MOUTH ONCE DAILY.   levETIRAcetam 500 MG tablet Commonly known as: KEPPRA Take 1 tablet (500 mg total) by mouth 2 (two) times daily for 7 days.   melatonin 3 MG Tabs tablet Take 3 mg by mouth at bedtime as needed (Sleep).   metFORMIN 500 MG tablet Commonly known as: GLUCOPHAGE Take 500 mg by mouth 2 (two) times daily.   mirabegron ER 50 MG Tb24 tablet Commonly known as: MYRBETRIQ Take 1 tablet (50 mg total) by mouth daily.   OXYGEN Inhale 2 L into the lungs at bedtime.   pantoprazole 40 MG tablet Commonly known as: Protonix Take 1 tablet (40 mg total) by mouth daily. What changed: when to take this   pregabalin 100 MG capsule Commonly known as: LYRICA Take 1 capsule (100 mg total) by mouth 3 (three) times daily.   STOOL SOFTENER PO Take 100 mg by mouth at bedtime.   traMADol 50 MG tablet Commonly known as: ULTRAM Take 50 mg by mouth 2 (two) times daily as needed.       No Known Allergies Discharge Instructions     Call MD for:  difficulty breathing, headache or visual disturbances   Complete by: As directed    Call MD for:  extreme fatigue   Complete by: As directed    Call MD for:  persistant dizziness or light-headedness   Complete by: As directed    Call  MD for:  persistant nausea and vomiting   Complete by: As directed    Call MD for:  severe uncontrolled pain   Complete by: As directed    Call MD for:  temperature >100.4   Complete by: As directed    Diet - low sodium heart healthy   Complete by: As directed    Discharge instructions   Complete by: As directed    Follow-up with PCP in 1 week.  Hold Eliquis and aspirin for 1 week and then resume if no changes in the mental status.  Continue Keppra 500 mg p.o.  twice daily for 7 days. Follow with neurosurgery in 2 to 4 weeks   Increase activity slowly   Complete by: As directed        The results of significant diagnostics from this hospitalization (including imaging, microbiology, ancillary and laboratory) are listed below for reference.    Significant Diagnostic Studies: CT HEAD WO CONTRAST ( )  Result Date: 06/25/2023 CLINICAL DATA:  Hemorrhage follow-up EXAM: CT HEAD WITHOUT CONTRAST TECHNIQUE: Contiguous axial images were obtained from the base of the skull through the vertex without intravenous contrast. RADIATION DOSE REDUCTION: This exam was performed according to the departmental dose-optimization program which includes automated exposure control, adjustment of the mA and/or kV according to patient size and/or use of iterative reconstruction technique. COMPARISON:  06/24/2023 FINDINGS: Brain: Small focus of subarachnoid hemorrhage over the left temporal lobe is unchanged. Vascular: No abnormal hyperdensity of the major intracranial arteries or dural venous sinuses. No intracranial atherosclerosis. Skull: The visualized skull base, calvarium and extracranial soft tissues are normal. Sinuses/Orbits: Chronic maxillary and ethmoid sinus disease. The orbits are normal. IMPRESSION: Unchanged small focus of subarachnoid hemorrhage over the left temporal lobe. Electronically Signed   By: Deatra Robinson M.D.   On: 06/25/2023 01:09   CT HEAD WO CONTRAST ( )  Result Date: 06/24/2023 CLINICAL  DATA:  Trauma. EXAM: CT HEAD WITHOUT CONTRAST CT MAXILLOFACIAL WITHOUT CONTRAST CT CERVICAL SPINE WITHOUT CONTRAST TECHNIQUE: Multidetector CT imaging of the head, cervical spine, and maxillofacial structures were performed using the standard protocol without intravenous contrast. Multiplanar CT image reconstructions of the cervical spine and maxillofacial structures were also generated. RADIATION DOSE REDUCTION: This exam was performed according to the departmental dose-optimization program which includes automated exposure control, adjustment of the mA and/or kV according to patient size and/or use of iterative reconstruction technique. COMPARISON:  Head CT dated 02/08/2022. FINDINGS: CT HEAD FINDINGS Brain: The ventricles and sulci are appropriate size for patient's age. The gray-white matter discrimination is preserved. Minimal subarachnoid hemorrhage in the left temporal lobe and possibly within the sylvian fissure. No mass effect midline shift. Vascular: No hyperdense vessel or unexpected calcification. Skull: Normal. Negative for fracture or focal lesion. Other: None CT MAXILLOFACIAL FINDINGS Osseous: No acute fracture.  No mandibular dislocation. Orbits: The globes and retro-orbital fat are preserved. Sinuses: There is diffuse mucoperiosteal thickening of paranasal sinuses with opacification of several ethmoid air cells. No air-fluid level. The mastoid air cells are clear. Soft tissues: Negative. CT CERVICAL SPINE FINDINGS Alignment: No acute subluxation. There is straightening of normal cervical lordosis which may be positional or due to muscle spasm. Skull base and vertebrae: No acute fracture.  Osteopenia. Soft tissues and spinal canal: No prevertebral fluid or swelling. No visible canal hematoma. Disc levels:  No acute findings.  Degenerative changes. Upper chest: Negative. Other: Left carotid bulb calcified plaques. IMPRESSION: 1. Small left temporal subarachnoid hemorrhage. No mass effect or midline  shift. 2. No acute/traumatic cervical spine pathology. 3. No acute facial bone fractures. These results were called by telephone at the time of interpretation on 06/24/2023 at 6:40 pm to provider PHILLIP STAFFORD , who verbally acknowledged these results. Electronically Signed   By: Elgie Collard M.D.   On: 06/24/2023 18:42   CT Maxillofacial Wo Contrast  Result Date: 06/24/2023 CLINICAL DATA:  Trauma. EXAM: CT HEAD WITHOUT CONTRAST CT MAXILLOFACIAL WITHOUT CONTRAST CT CERVICAL SPINE WITHOUT CONTRAST TECHNIQUE: Multidetector CT imaging of the head, cervical spine, and maxillofacial structures were performed using the standard protocol without intravenous contrast. Multiplanar CT  image reconstructions of the cervical spine and maxillofacial structures were also generated. RADIATION DOSE REDUCTION: This exam was performed according to the departmental dose-optimization program which includes automated exposure control, adjustment of the mA and/or kV according to patient size and/or use of iterative reconstruction technique. COMPARISON:  Head CT dated 02/08/2022. FINDINGS: CT HEAD FINDINGS Brain: The ventricles and sulci are appropriate size for patient's age. The gray-white matter discrimination is preserved. Minimal subarachnoid hemorrhage in the left temporal lobe and possibly within the sylvian fissure. No mass effect midline shift. Vascular: No hyperdense vessel or unexpected calcification. Skull: Normal. Negative for fracture or focal lesion. Other: None CT MAXILLOFACIAL FINDINGS Osseous: No acute fracture.  No mandibular dislocation. Orbits: The globes and retro-orbital fat are preserved. Sinuses: There is diffuse mucoperiosteal thickening of paranasal sinuses with opacification of several ethmoid air cells. No air-fluid level. The mastoid air cells are clear. Soft tissues: Negative. CT CERVICAL SPINE FINDINGS Alignment: No acute subluxation. There is straightening of normal cervical lordosis which may be  positional or due to muscle spasm. Skull base and vertebrae: No acute fracture.  Osteopenia. Soft tissues and spinal canal: No prevertebral fluid or swelling. No visible canal hematoma. Disc levels:  No acute findings.  Degenerative changes. Upper chest: Negative. Other: Left carotid bulb calcified plaques. IMPRESSION: 1. Small left temporal subarachnoid hemorrhage. No mass effect or midline shift. 2. No acute/traumatic cervical spine pathology. 3. No acute facial bone fractures. These results were called by telephone at the time of interpretation on 06/24/2023 at 6:40 pm to provider PHILLIP STAFFORD , who verbally acknowledged these results. Electronically Signed   By: Elgie Collard M.D.   On: 06/24/2023 18:42   CT Cervical Spine Wo Contrast  Result Date: 06/24/2023 CLINICAL DATA:  Trauma. EXAM: CT HEAD WITHOUT CONTRAST CT MAXILLOFACIAL WITHOUT CONTRAST CT CERVICAL SPINE WITHOUT CONTRAST TECHNIQUE: Multidetector CT imaging of the head, cervical spine, and maxillofacial structures were performed using the standard protocol without intravenous contrast. Multiplanar CT image reconstructions of the cervical spine and maxillofacial structures were also generated. RADIATION DOSE REDUCTION: This exam was performed according to the departmental dose-optimization program which includes automated exposure control, adjustment of the mA and/or kV according to patient size and/or use of iterative reconstruction technique. COMPARISON:  Head CT dated 02/08/2022. FINDINGS: CT HEAD FINDINGS Brain: The ventricles and sulci are appropriate size for patient's age. The gray-white matter discrimination is preserved. Minimal subarachnoid hemorrhage in the left temporal lobe and possibly within the sylvian fissure. No mass effect midline shift. Vascular: No hyperdense vessel or unexpected calcification. Skull: Normal. Negative for fracture or focal lesion. Other: None CT MAXILLOFACIAL FINDINGS Osseous: No acute fracture.  No  mandibular dislocation. Orbits: The globes and retro-orbital fat are preserved. Sinuses: There is diffuse mucoperiosteal thickening of paranasal sinuses with opacification of several ethmoid air cells. No air-fluid level. The mastoid air cells are clear. Soft tissues: Negative. CT CERVICAL SPINE FINDINGS Alignment: No acute subluxation. There is straightening of normal cervical lordosis which may be positional or due to muscle spasm. Skull base and vertebrae: No acute fracture.  Osteopenia. Soft tissues and spinal canal: No prevertebral fluid or swelling. No visible canal hematoma. Disc levels:  No acute findings.  Degenerative changes. Upper chest: Negative. Other: Left carotid bulb calcified plaques. IMPRESSION: 1. Small left temporal subarachnoid hemorrhage. No mass effect or midline shift. 2. No acute/traumatic cervical spine pathology. 3. No acute facial bone fractures. These results were called by telephone at the time of interpretation on 06/24/2023 at  6:40 pm to provider PHILLIP STAFFORD , who verbally acknowledged these results. Electronically Signed   By: Elgie Collard M.D.   On: 06/24/2023 18:42    Microbiology: No results found for this or any previous visit (from the past 240 hour(s)).   Labs: CBC: Recent Labs  Lab 06/24/23 1849 06/25/23 0322  WBC 7.0 6.0  NEUTROABS 4.9  --   HGB 12.9 12.2  HCT 40.0 38.5  MCV 96.6 96.5  PLT 159 141*   Basic Metabolic Panel: Recent Labs  Lab 06/24/23 1849  NA 137  K 3.7  CL 104  CO2 23  GLUCOSE 122*  BUN 24*  CREATININE 0.55  CALCIUM 8.8*   Liver Function Tests: No results for input(s): "AST", "ALT", "ALKPHOS", "BILITOT", "PROT", "ALBUMIN" in the last 168 hours. No results for input(s): "LIPASE", "AMYLASE" in the last 168 hours. No results for input(s): "AMMONIA" in the last 168 hours. Cardiac Enzymes: No results for input(s): "CKTOTAL", "CKMB", "CKMBINDEX", "TROPONINI" in the last 168 hours. BNP (last 3 results) No results for  input(s): "BNP" in the last 8760 hours. CBG: Recent Labs  Lab 06/25/23 0626  GLUCAP 107*    Time spent: 35 minutes  Signed:  Gillis Santa  [facility]  06/25/2023 3:47 PM

## 2023-06-29 ENCOUNTER — Ambulatory Visit: Admission: RE | Admit: 2023-06-29 | Payer: Medicare HMO | Source: Ambulatory Visit | Admitting: Ophthalmology

## 2023-06-29 SURGERY — PHACOEMULSIFICATION, CATARACT, WITH IOL INSERTION
Anesthesia: Topical | Laterality: Right

## 2023-07-13 ENCOUNTER — Ambulatory Visit: Admit: 2023-07-13 | Payer: Medicare HMO | Admitting: Ophthalmology

## 2023-07-13 SURGERY — PHACOEMULSIFICATION, CATARACT, WITH IOL INSERTION
Anesthesia: Topical | Laterality: Left

## 2023-08-10 ENCOUNTER — Other Ambulatory Visit: Payer: Self-pay | Admitting: Oncology

## 2023-09-02 DIAGNOSIS — H401121 Primary open-angle glaucoma, left eye, mild stage: Secondary | ICD-10-CM | POA: Diagnosis present

## 2023-09-03 ENCOUNTER — Encounter: Payer: Self-pay | Admitting: *Deleted

## 2023-09-03 ENCOUNTER — Inpatient Hospital Stay
Admission: EM | Admit: 2023-09-03 | Discharge: 2023-09-07 | DRG: 872 | Disposition: A | Discharge status: Home / Self Care | Payer: Medicare HMO | Attending: Internal Medicine | Admitting: Internal Medicine

## 2023-09-03 ENCOUNTER — Emergency Department: Payer: Medicare HMO

## 2023-09-03 ENCOUNTER — Other Ambulatory Visit: Payer: Self-pay

## 2023-09-03 DIAGNOSIS — K130 Diseases of lips: Secondary | ICD-10-CM | POA: Diagnosis present

## 2023-09-03 DIAGNOSIS — I480 Paroxysmal atrial fibrillation: Secondary | ICD-10-CM | POA: Diagnosis present

## 2023-09-03 DIAGNOSIS — W57XXXA Bitten or stung by nonvenomous insect and other nonvenomous arthropods, initial encounter: Secondary | ICD-10-CM | POA: Diagnosis present

## 2023-09-03 DIAGNOSIS — I10 Essential (primary) hypertension: Secondary | ICD-10-CM | POA: Diagnosis present

## 2023-09-03 DIAGNOSIS — Z7901 Long term (current) use of anticoagulants: Secondary | ICD-10-CM | POA: Diagnosis not present

## 2023-09-03 DIAGNOSIS — I350 Nonrheumatic aortic (valve) stenosis: Secondary | ICD-10-CM | POA: Diagnosis present

## 2023-09-03 DIAGNOSIS — H401121 Primary open-angle glaucoma, left eye, mild stage: Secondary | ICD-10-CM | POA: Diagnosis present

## 2023-09-03 DIAGNOSIS — Z9981 Dependence on supplemental oxygen: Secondary | ICD-10-CM | POA: Diagnosis not present

## 2023-09-03 DIAGNOSIS — E1143 Type 2 diabetes mellitus with diabetic autonomic (poly)neuropathy: Secondary | ICD-10-CM | POA: Diagnosis present

## 2023-09-03 DIAGNOSIS — Z79899 Other long term (current) drug therapy: Secondary | ICD-10-CM

## 2023-09-03 DIAGNOSIS — E876 Hypokalemia: Secondary | ICD-10-CM | POA: Diagnosis present

## 2023-09-03 DIAGNOSIS — K3184 Gastroparesis: Secondary | ICD-10-CM | POA: Diagnosis present

## 2023-09-03 DIAGNOSIS — Z7984 Long term (current) use of oral hypoglycemic drugs: Secondary | ICD-10-CM | POA: Diagnosis not present

## 2023-09-03 DIAGNOSIS — Z6837 Body mass index (BMI) 37.0-37.9, adult: Secondary | ICD-10-CM

## 2023-09-03 DIAGNOSIS — K219 Gastro-esophageal reflux disease without esophagitis: Secondary | ICD-10-CM | POA: Diagnosis present

## 2023-09-03 DIAGNOSIS — I5032 Chronic diastolic (congestive) heart failure: Secondary | ICD-10-CM | POA: Diagnosis present

## 2023-09-03 DIAGNOSIS — A419 Sepsis, unspecified organism: Secondary | ICD-10-CM | POA: Diagnosis present

## 2023-09-03 DIAGNOSIS — E785 Hyperlipidemia, unspecified: Secondary | ICD-10-CM | POA: Diagnosis present

## 2023-09-03 DIAGNOSIS — Z923 Personal history of irradiation: Secondary | ICD-10-CM

## 2023-09-03 DIAGNOSIS — Z853 Personal history of malignant neoplasm of breast: Secondary | ICD-10-CM

## 2023-09-03 DIAGNOSIS — Z7982 Long term (current) use of aspirin: Secondary | ICD-10-CM

## 2023-09-03 DIAGNOSIS — Z800 Family history of malignant neoplasm of digestive organs: Secondary | ICD-10-CM

## 2023-09-03 DIAGNOSIS — Z5986 Financial insecurity: Secondary | ICD-10-CM

## 2023-09-03 DIAGNOSIS — G4733 Obstructive sleep apnea (adult) (pediatric): Secondary | ICD-10-CM | POA: Diagnosis present

## 2023-09-03 DIAGNOSIS — I110 Hypertensive heart disease with heart failure: Secondary | ICD-10-CM | POA: Diagnosis present

## 2023-09-03 DIAGNOSIS — S00461A Insect bite (nonvenomous) of right ear, initial encounter: Secondary | ICD-10-CM | POA: Diagnosis present

## 2023-09-03 DIAGNOSIS — G894 Chronic pain syndrome: Secondary | ICD-10-CM | POA: Diagnosis present

## 2023-09-03 DIAGNOSIS — Z8669 Personal history of other diseases of the nervous system and sense organs: Secondary | ICD-10-CM

## 2023-09-03 DIAGNOSIS — J3489 Other specified disorders of nose and nasal sinuses: Secondary | ICD-10-CM | POA: Diagnosis present

## 2023-09-03 DIAGNOSIS — Z79811 Long term (current) use of aromatase inhibitors: Secondary | ICD-10-CM

## 2023-09-03 DIAGNOSIS — L03211 Cellulitis of face: Secondary | ICD-10-CM | POA: Diagnosis present

## 2023-09-03 DIAGNOSIS — Z8614 Personal history of Methicillin resistant Staphylococcus aureus infection: Secondary | ICD-10-CM

## 2023-09-03 DIAGNOSIS — J9611 Chronic respiratory failure with hypoxia: Secondary | ICD-10-CM | POA: Diagnosis present

## 2023-09-03 DIAGNOSIS — S00451A Superficial foreign body of right ear, initial encounter: Secondary | ICD-10-CM

## 2023-09-03 DIAGNOSIS — E119 Type 2 diabetes mellitus without complications: Secondary | ICD-10-CM

## 2023-09-03 DIAGNOSIS — Z1839 Other retained organic fragments: Secondary | ICD-10-CM

## 2023-09-03 DIAGNOSIS — T783XXA Angioneurotic edema, initial encounter: Secondary | ICD-10-CM | POA: Diagnosis present

## 2023-09-03 LAB — CBC WITH DIFFERENTIAL/PLATELET
Abs Immature Granulocytes: 0.08 10*3/uL — ABNORMAL HIGH (ref 0.00–0.07)
Basophils Absolute: 0.1 10*3/uL (ref 0.0–0.1)
Basophils Relative: 0 %
Eosinophils Absolute: 0 10*3/uL (ref 0.0–0.5)
Eosinophils Relative: 0 %
HCT: 41.1 % (ref 36.0–46.0)
Hemoglobin: 13.3 g/dL (ref 12.0–15.0)
Immature Granulocytes: 1 %
Lymphocytes Relative: 10 %
Lymphs Abs: 1.2 10*3/uL (ref 0.7–4.0)
MCH: 31.4 pg (ref 26.0–34.0)
MCHC: 32.4 g/dL (ref 30.0–36.0)
MCV: 97.2 fL (ref 80.0–100.0)
Monocytes Absolute: 1.2 10*3/uL — ABNORMAL HIGH (ref 0.1–1.0)
Monocytes Relative: 10 %
Neutro Abs: 9.7 10*3/uL — ABNORMAL HIGH (ref 1.7–7.7)
Neutrophils Relative %: 79 %
Platelets: 168 10*3/uL (ref 150–400)
RBC: 4.23 MIL/uL (ref 3.87–5.11)
RDW: 14.2 % (ref 11.5–15.5)
WBC: 12.2 10*3/uL — ABNORMAL HIGH (ref 4.0–10.5)
nRBC: 0 % (ref 0.0–0.2)

## 2023-09-03 LAB — COMPREHENSIVE METABOLIC PANEL
ALT: 34 U/L (ref 0–44)
AST: 26 U/L (ref 15–41)
Albumin: 4 g/dL (ref 3.5–5.0)
Alkaline Phosphatase: 75 U/L (ref 38–126)
Anion gap: 12 (ref 5–15)
BUN: 15 mg/dL (ref 8–23)
CO2: 26 mmol/L (ref 22–32)
Calcium: 8.9 mg/dL (ref 8.9–10.3)
Chloride: 98 mmol/L (ref 98–111)
Creatinine, Ser: 0.56 mg/dL (ref 0.44–1.00)
GFR, Estimated: >60 mL/min (ref >60)
Glucose, Bld: 126 mg/dL — ABNORMAL HIGH (ref 70–99)
Potassium: 4 mmol/L (ref 3.5–5.1)
Sodium: 136 mmol/L (ref 135–145)
Total Bilirubin: 0.9 mg/dL (ref 0.3–1.2)
Total Protein: 7.7 g/dL (ref 6.5–8.1)

## 2023-09-03 LAB — LACTIC ACID, PLASMA: Lactic Acid, Venous: 0.9 mmol/L (ref 0.5–1.9)

## 2023-09-03 LAB — GLUCOSE, CAPILLARY: Glucose-Capillary: 193 mg/dL — ABNORMAL HIGH (ref 70–99)

## 2023-09-03 MED ORDER — ACETAMINOPHEN 325 MG PO TABS
650.0000 mg | ORAL_TABLET | Freq: Once | ORAL | Status: DC
  Filled 2023-09-03: qty 2

## 2023-09-03 MED ORDER — PREGABALIN 50 MG PO CAPS
100.0000 mg | ORAL_CAPSULE | Freq: Three times a day (TID) | ORAL | Status: DC
  Administered 2023-09-04 – 2023-09-07 (×11): 100 mg via ORAL
  Filled 2023-09-03 (×12): qty 2

## 2023-09-03 MED ORDER — DILTIAZEM HCL ER COATED BEADS 240 MG PO CP24: 240.0000 mg | ORAL_CAPSULE | Freq: Every day | ORAL | Status: DC

## 2023-09-03 MED ORDER — VANCOMYCIN HCL IN DEXTROSE 1-5 GM/200ML-% IV SOLN
1000.0000 mg | Freq: Once | INTRAVENOUS | Status: AC
  Administered 2023-09-03: 1000 mg via INTRAVENOUS
  Filled 2023-09-03: qty 200

## 2023-09-03 MED ORDER — FUROSEMIDE 40 MG PO TABS: 20.0000 mg | ORAL_TABLET | Freq: Every day | ORAL | Status: DC

## 2023-09-03 MED ORDER — HYDRALAZINE HCL 20 MG/ML IJ SOLN: 5.0000 mg | INTRAMUSCULAR | Status: DC | PRN

## 2023-09-03 MED ORDER — ONDANSETRON HCL 4 MG PO TABS: 4.0000 mg | ORAL_TABLET | Freq: Four times a day (QID) | ORAL | Status: DC | PRN

## 2023-09-03 MED ORDER — PANTOPRAZOLE SODIUM 40 MG PO TBEC
40.0000 mg | DELAYED_RELEASE_TABLET | Freq: Every day | ORAL | Status: DC
  Administered 2023-09-04 – 2023-09-07 (×4): 40 mg via ORAL
  Filled 2023-09-03 (×4): qty 1

## 2023-09-03 MED ORDER — DORZOLAMIDE HCL-TIMOLOL MAL 2-0.5 % OP SOLN
1.0000 [drp] | Freq: Two times a day (BID) | OPHTHALMIC | Status: DC
  Administered 2023-09-04 – 2023-09-07 (×8): 1 [drp] via OPHTHALMIC
  Filled 2023-09-03: qty 10

## 2023-09-03 MED ORDER — MIRABEGRON ER 50 MG PO TB24
50.0000 mg | ORAL_TABLET | Freq: Every day | ORAL | Status: DC
  Administered 2023-09-04 – 2023-09-07 (×4): 50 mg via ORAL
  Filled 2023-09-03 (×4): qty 1

## 2023-09-03 MED ORDER — SODIUM CHLORIDE 0.9 % IV SOLN
3.0000 g | Freq: Once | INTRAVENOUS | Status: AC
  Administered 2023-09-03: 3 g via INTRAVENOUS
  Filled 2023-09-03: qty 8

## 2023-09-03 MED ORDER — LACTATED RINGERS IV SOLN: INTRAVENOUS | Status: AC

## 2023-09-03 MED ORDER — ONDANSETRON HCL 4 MG/2ML IJ SOLN
4.0000 mg | Freq: Four times a day (QID) | INTRAMUSCULAR | Status: DC | PRN
  Administered 2023-09-06: 4 mg via INTRAVENOUS
  Filled 2023-09-03 (×2): qty 2

## 2023-09-03 MED ORDER — METHYLPREDNISOLONE SODIUM SUCC 40 MG IJ SOLR
40.0000 mg | Freq: Two times a day (BID) | INTRAMUSCULAR | Status: DC
  Administered 2023-09-04 (×2): 40 mg via INTRAVENOUS
  Filled 2023-09-03 (×2): qty 1

## 2023-09-03 MED ORDER — DIPHENHYDRAMINE HCL 50 MG/ML IJ SOLN
25.0000 mg | Freq: Once | INTRAMUSCULAR | Status: AC
  Administered 2023-09-03: 25 mg via INTRAVENOUS
  Filled 2023-09-03: qty 1

## 2023-09-03 MED ORDER — DEXAMETHASONE SODIUM PHOSPHATE 10 MG/ML IJ SOLN
10.0000 mg | Freq: Once | INTRAMUSCULAR | Status: AC
  Administered 2023-09-03: 10 mg via INTRAVENOUS
  Filled 2023-09-03: qty 1

## 2023-09-03 MED ORDER — INSULIN ASPART 100 UNIT/ML IJ SOLN
0.0000 [IU] | Freq: Three times a day (TID) | INTRAMUSCULAR | Status: DC
  Administered 2023-09-04: 5 [IU] via SUBCUTANEOUS
  Administered 2023-09-04: 2 [IU] via SUBCUTANEOUS
  Administered 2023-09-04: 5 [IU] via SUBCUTANEOUS
  Administered 2023-09-05: 3 [IU] via SUBCUTANEOUS
  Administered 2023-09-05: 5 [IU] via SUBCUTANEOUS
  Administered 2023-09-05 – 2023-09-06 (×3): 3 [IU] via SUBCUTANEOUS
  Administered 2023-09-06 – 2023-09-07 (×2): 2 [IU] via SUBCUTANEOUS
  Filled 2023-09-03 (×10): qty 1

## 2023-09-03 MED ORDER — LATANOPROST 0.005 % OP SOLN
1.0000 [drp] | Freq: Every day | OPHTHALMIC | Status: DC
  Administered 2023-09-04 – 2023-09-06 (×4): 1 [drp] via OPHTHALMIC
  Filled 2023-09-03: qty 2.5

## 2023-09-03 MED ORDER — DIPHENHYDRAMINE HCL 50 MG/ML IJ SOLN
25.0000 mg | Freq: Two times a day (BID) | INTRAMUSCULAR | Status: DC
  Administered 2023-09-04 – 2023-09-07 (×8): 25 mg via INTRAVENOUS
  Filled 2023-09-03 (×8): qty 1

## 2023-09-03 MED ORDER — LETROZOLE 2.5 MG PO TABS
2.5000 mg | ORAL_TABLET | Freq: Every day | ORAL | Status: DC
  Administered 2023-09-04 – 2023-09-07 (×4): 2.5 mg via ORAL
  Filled 2023-09-03 (×4): qty 1

## 2023-09-03 MED ORDER — ATORVASTATIN CALCIUM 20 MG PO TABS
80.0000 mg | ORAL_TABLET | Freq: Every day | ORAL | Status: DC
  Administered 2023-09-04 – 2023-09-07 (×4): 80 mg via ORAL
  Filled 2023-09-03 (×4): qty 4

## 2023-09-03 MED ORDER — ACETAMINOPHEN 650 MG RE SUPP: 650.0000 mg | Freq: Four times a day (QID) | RECTAL | Status: DC | PRN

## 2023-09-03 MED ORDER — LEVETIRACETAM 500 MG PO TABS
500.0000 mg | ORAL_TABLET | Freq: Two times a day (BID) | ORAL | Status: DC
  Administered 2023-09-04 – 2023-09-06 (×7): 500 mg via ORAL
  Filled 2023-09-03 (×8): qty 1

## 2023-09-03 MED ORDER — CHLORHEXIDINE GLUCONATE 0.12 % MT SOLN
15.0000 mL | Freq: Two times a day (BID) | OROMUCOSAL | Status: DC
  Administered 2023-09-04 – 2023-09-07 (×8): 15 mL via OROMUCOSAL
  Filled 2023-09-03 (×8): qty 15

## 2023-09-03 MED ORDER — ACETAMINOPHEN 325 MG PO TABS
650.0000 mg | ORAL_TABLET | Freq: Four times a day (QID) | ORAL | Status: DC | PRN
  Administered 2023-09-04 – 2023-09-07 (×4): 650 mg via ORAL
  Filled 2023-09-03 (×4): qty 2

## 2023-09-03 MED ORDER — CHLORHEXIDINE GLUCONATE CLOTH 2 % EX PADS
6.0000 | MEDICATED_PAD | Freq: Every day | CUTANEOUS | Status: DC
  Administered 2023-09-05 – 2023-09-07 (×3): 6 via TOPICAL

## 2023-09-03 MED ORDER — APIXABAN 5 MG PO TABS
5.0000 mg | ORAL_TABLET | Freq: Two times a day (BID) | ORAL | Status: DC
  Administered 2023-09-04 – 2023-09-07 (×8): 5 mg via ORAL
  Filled 2023-09-03 (×8): qty 1

## 2023-09-03 MED ORDER — AMIODARONE HCL 200 MG PO TABS
200.0000 mg | ORAL_TABLET | Freq: Every day | ORAL | Status: DC
  Administered 2023-09-04 – 2023-09-07 (×4): 200 mg via ORAL
  Filled 2023-09-03 (×4): qty 1

## 2023-09-03 MED ORDER — VANCOMYCIN HCL 1250 MG/250ML IV SOLN
1250.0000 mg | INTRAVENOUS | Status: DC
  Administered 2023-09-04 – 2023-09-06 (×3): 1250 mg via INTRAVENOUS
  Filled 2023-09-03 (×4): qty 250

## 2023-09-03 MED ORDER — FAMOTIDINE IN NACL 20-0.9 MG/50ML-% IV SOLN
20.0000 mg | Freq: Two times a day (BID) | INTRAVENOUS | Status: DC
  Administered 2023-09-04 – 2023-09-06 (×7): 20 mg via INTRAVENOUS
  Filled 2023-09-03 (×8): qty 50

## 2023-09-03 MED ORDER — IOHEXOL 300 MG/ML  SOLN
75.0000 mL | Freq: Once | INTRAMUSCULAR | Status: AC | PRN
  Administered 2023-09-03: 75 mL via INTRAVENOUS

## 2023-09-03 MED ORDER — DULOXETINE HCL 30 MG PO CPEP
60.0000 mg | ORAL_CAPSULE | Freq: Every day | ORAL | Status: DC
  Administered 2023-09-05 – 2023-09-07 (×3): 60 mg via ORAL
  Filled 2023-09-03 (×3): qty 2
  Filled 2023-09-03: qty 1
  Filled 2023-09-03: qty 2

## 2023-09-03 MED ORDER — SODIUM CHLORIDE 0.9 % IV SOLN
2.0000 g | Freq: Three times a day (TID) | INTRAVENOUS | Status: DC
  Administered 2023-09-04 – 2023-09-07 (×11): 2 g via INTRAVENOUS
  Filled 2023-09-03 (×14): qty 12.5

## 2023-09-03 MED ORDER — ACETAMINOPHEN 160 MG/5ML PO SOLN
650.0000 mg | Freq: Once | ORAL | Status: AC
  Administered 2023-09-03: 650 mg via ORAL
  Filled 2023-09-03: qty 20.3

## 2023-09-03 NOTE — ED Notes (Signed)
Family contact: Juanell Fairly [phone]

## 2023-09-03 NOTE — Progress Notes (Signed)
PHARMACY -  BRIEF ANTIBIOTIC NOTE   Pharmacy has received consult(s) for Vancomycin from an ED provider.  The patient's profile has been reviewed for ht/wt/allergies/indication/available labs.    One time order(s) placed for Vancomycin 2000 mg IV.  Further antibiotics/pharmacy consults should be ordered by admitting physician if indicated.                       Thank you, Jerrilyn Cairo, PharmD 09/03/2023 9:08 PM

## 2023-09-03 NOTE — ED Notes (Signed)
This RN attempted IV access without success.  ?

## 2023-09-03 NOTE — Progress Notes (Signed)
Pharmacy Antibiotic Note  Rhonda Thornton is a 72 y.o. female admitted on 09/03/2023 with cellulitis.  Pharmacy has been consulted for Vanc, cefepime dosing.  Plan: Cefepime 2 gm IV Q8H ordered to start on 9/12 @ 2230.  Vancomycin 1 gm IV X 1 given in ED on 9/12 @ 2122 followed by Vanc 1 gm IV X 1 to make total loading dose of 2 gm.  Vancomycin 1250 mg IV Q24H ordered to start on 9/13 @ 2100.  AUC = 548.2  Vanc trough = 13.2   Height: 5\' 3"  (160 cm) Weight: 96.2 kg (212 lb) IBW/kg (Calculated) : 52.4  Temp (24hrs), Avg:100.1 F (37.8 C), Min:99.1 F (37.3 C), Max:101.3 F (38.5 C)  Recent Labs  Lab 09/03/23 1732 09/03/23 1917  WBC 12.2*  --   CREATININE 0.56  --   LATICACIDVEN  --  0.9    Estimated Creatinine Clearance: 69.1 mL/min (by C-G formula based on SCr of 0.56 mg/dL).    No Known Allergies  Antimicrobials this admission:   >>    >>   Dose adjustments this admission:   Microbiology results:  BCx:   UCx:    Sputum:    MRSA PCR:   Thank you for allowing pharmacy to be a part of this patient's care.  Koray Soter D 09/03/2023 10:43 PM

## 2023-09-03 NOTE — ED Triage Notes (Signed)
Pt to triage via wheelchair.  Pt sent from [facility] for eval.  Pt has swelling of upper lip and face since yesterday.  Pt reports a sore area in right nares for 4 days.  Pt lost 4 teeth this week since sx began.  Pt reports diff swallowing.  No tongue swelling pt alert.

## 2023-09-03 NOTE — H&P (Signed)
History and Physical    Patient: Rhonda Thornton KGM:[id number] DOB: 1951-01-11 DOA: 09/03/2023 DOS: the patient was seen and examined on 09/04/2023 PCP: Hillery Aldo, MD  Patient coming from: Home   Chief Complaint:  Chief Complaint  Patient presents with   Facial Swelling    HPI: Rhonda Thornton is a 72 y.o. female with medical history significant for anemia, hypertension, morbid obesity, diabetes mellitus type 2 presenting with lip swelling x 5 days,  she was seen today at [facility] and then sent here. Pt has right nose sore and has lost 4 teeth this week and does report some difficulty with swallowing sometime. At baseline she wears 3 L oxygen at night . Pt has severe swelling of her upper lip on exam see the back of her throat her tongue is not swollen and it is midline.  Admission requested for lip infection and angioedema.  ENT was consult was requested and they will see patient tomorrow.   On exam pt was also found to have live tick crawling in her right ear which was removed by edmd.   In the emergency room vitals are stable and pt had Ct imaging shows Marked edema of the upper lip with inflammatory change in the subcutaneous fat of the lower face bilaterally. No abscess or  drainable fluid collection.Poor dentition with multiple apical lucencies of maxilla, greatest at the root the most posterior remaining tooth on the left.. Polypoid soft tissue within the left nasal cavity and upper ethmoid sinus, unchanged since 06/24/2023.   Review of Systems: Review of Systems  All other systems reviewed and are negative.  Past Medical History:  Diagnosis Date   Anemia    Anginal pain (HCC)    Arthritis    Breast cancer (HCC)    2022 [facility]   Chronic pain syndrome    Degenerative disc disease, lumbar    Edema of left lower extremity    Gastroparesis due to secondary diabetes (HCC)    GERD (gastroesophageal reflux disease)    Heart murmur    History of seizure disorder    Hyperlipidemia     Hypertension    Invasive carcinoma of breast (HCC) 04/25/2021   LEFT; stage 1A; grade I invasive mammary carcinoma; ER/PR (+); HER2/neu (-)   Mild aortic stenosis    Mild mitral regurgitation    Mild tricuspid regurgitation    Morbid obesity (HCC)    Opioid use    therapeutic use for Dx of chronic pain syndrome   OSA (obstructive sleep apnea)    O2 at night   Personal history of radiation therapy    Recurrent falls while walking    uses rolling walker    Requires supplemental oxygen    2L/[place] at bedtime   T2DM (type 2 diabetes mellitus) (HCC)    Tubular adenoma of colon    Past Surgical History:  Procedure Laterality Date   ABDOMINAL HYSTERECTOMY  1991   partial   BREAST BIOPSY Left 0505/2022   u/s bx-"X" clip-INVASIVE MAMMARY CARCINOMA WITH TUBULAR FEATURES   BREAST BIOPSY Right 05/05/2023   [place] Core Bx,-ribbon clip- path pending   BREAST BIOPSY Right 05/05/2023   [place] RT BREAST BX W LOC DEV 1ST LESION IMG BX SPEC US GUIDE 05/05/2023 [facility]   BREAST CYST ASPIRATION Right 07/16/2016   neg   BREAST LUMPECTOMY,RADIO FREQ LOCALIZER,AXILLARY SENTINEL LYMPH NODE BIOPSY Left 05/22/2021   Procedure: BREAST LUMPECTOMY,RADIO FREQ LOCALIZER,AXILLARY SENTINEL LYMPH NODE BIOPSY;  Surgeon: Campbell Lerner, MD;  Location:  [facility] ORS;  Service: General;  Laterality: Left;   CARDIOVERSION N/A 02/14/2022   Procedure: CARDIOVERSION;  Surgeon: Antonieta Iba, MD;  Location: [facility] ORS;  Service: Cardiovascular;  Laterality: N/A;  TEE and cardioversion   CHOLECYSTECTOMY     COLONOSCOPY WITH PROPOFOL N/A 07/30/2016   Procedure: COLONOSCOPY WITH PROPOFOL;  Surgeon: Christena Deem, MD;  Location: [facility] ENDOSCOPY;  Service: Endoscopy;  Laterality: N/A;   ESOPHAGOGASTRODUODENOSCOPY (EGD) WITH PROPOFOL N/A 09/09/2017   Procedure: ESOPHAGOGASTRODUODENOSCOPY (EGD) WITH PROPOFOL;  Surgeon: Toledo, Boykin Nearing, MD;  Location: [facility] ENDOSCOPY;  Service: Endoscopy;  Laterality: N/A;   EYE SURGERY Right     for pterygium   FUNCTIONAL ENDOSCOPIC SINUS SURGERY     TEE WITHOUT CARDIOVERSION N/A 02/14/2022   Procedure: TRANSESOPHAGEAL ECHOCARDIOGRAM (TEE);  Surgeon: Antonieta Iba, MD;  Location: [facility] ORS;  Service: Cardiovascular;  Laterality: N/A;   TONSILLECTOMY     Social History:   reports that she has never smoked. She has never used smokeless tobacco. She reports that she does not drink alcohol and does not use drugs.  No Known Allergies  Family History  Problem Relation Age of Onset   Stomach cancer Mother    Colon cancer Brother    Breast cancer Neg Hx     Prior to Admission medications   Medication Sig Start Date End Date Taking? Authorizing Provider  acetaminophen (TYLENOL) 325 MG tablet Take 650 mg by mouth 2 (two) times daily. 03/22/22   [provider]  alum & mag hydroxide-simeth (MAALOX/MYLANTA) 200-200-20 MG/5ML suspension Take 30 mLs by mouth every 6 (six) hours as needed for indigestion or heartburn.    [provider]  amiodarone (PACERONE) 200 MG tablet Take 1 tablet (200 mg total) by mouth daily. 02/18/22   Danford, Earl Lites, MD  apixaban (ELIQUIS) 5 MG TABS tablet Take 1 tablet (5 mg total) by mouth 2 (two) times daily. Hold for 1 wk 07/02/23   Gillis Santa, MD  aspirin EC 81 MG tablet Take 2 tablets (162 mg total) by mouth at bedtime. Swallow whole. Hold for 1 wk 07/02/23   Gillis Santa, MD  atorvastatin (LIPITOR) 80 MG tablet Take 80 mg by mouth daily.    [provider]  calcium carbonate (TUMS - DOSED IN MG ELEMENTAL CALCIUM) 500 MG chewable tablet Chew 1,000 mg by mouth 3 (three) times daily as needed for indigestion or heartburn.    [provider]  cetirizine (ZYRTEC) 10 MG tablet Take 10 mg by mouth daily.    [provider]  diltiazem (CARDIZEM CD) 240 MG 24 hr capsule Take 1 capsule (240 mg total) by mouth daily. 02/18/22   Danford, Earl Lites, MD  Docusate Calcium (STOOL SOFTENER PO) Take 100 mg by mouth at  bedtime.    [provider]  dorzolamide-timolol (COSOPT) 22.3-6.8 MG/ML ophthalmic solution Place 1 drop into both eyes 2 (two) times daily. 02/01/21   [provider]  DULoxetine (CYMBALTA) 60 MG capsule Take 60 mg by mouth daily. 09/09/22   [provider]  empagliflozin (JARDIANCE) 10 MG TABS tablet Take 10 mg by mouth daily. 05/15/22   [provider]  ferrous sulfate 325 (65 FE) MG tablet Take 325 mg by mouth daily.    [provider]  fluticasone (FLONASE) 50 MCG/ACT nasal spray Place 2 sprays into both nostrils daily. 09/05/20   [provider]  furosemide (LASIX) 20 MG tablet Take 20 mg by mouth daily. 08/12/22   [provider]  latanoprost (XALATAN) 0.005 % ophthalmic solution Place 1 drop into both eyes at bedtime. 11/05/20   [provider]  letrozole (FEMARA) 2.5 MG tablet TAKE 1 TABLET BY MOUTH ONCE DAILY. 08/10/23   Jeralyn Ruths, MD  levETIRAcetam (KEPPRA) 500 MG tablet Take 1 tablet (500 mg total) by mouth 2 (two) times daily for 7 days. 06/25/23 07/02/23  Gillis Santa, MD  melatonin 3 MG TABS tablet Take 3 mg by mouth at bedtime as needed (Sleep). 07/24/22   [provider]  metFORMIN (GLUCOPHAGE) 500 MG tablet Take 500 mg by mouth 2 (two) times daily.    [provider]  mirabegron ER (MYRBETRIQ) 50 MG TB24 tablet Take 1 tablet (50 mg total) by mouth daily. 02/04/23   Vanna Scotland, MD  Multiple Vitamin (DAILY-VITE MULTIVITAMIN) TABS Take 1 tablet by mouth daily. 01/30/21   [provider]  OXYGEN Inhale 2 L into the lungs at bedtime.    [provider]  pantoprazole (PROTONIX) 40 MG tablet Take 1 tablet (40 mg total) by mouth daily. Patient taking differently: Take 40 mg by mouth 2 (two) times daily. 08/23/17 06/24/23  Minna Antis, MD  pregabalin (LYRICA) 100 MG capsule Take 1 capsule (100 mg total) by mouth 3 (three) times daily. 02/18/22   Danford, Earl Lites, MD   traMADol (ULTRAM) 50 MG tablet Take 50 mg by mouth 2 (two) times daily as needed. 09/19/22   [provider]   Vitals:   09/04/23 0315 09/04/23 0330 09/04/23 0345 09/04/23 0400  BP: 122/65 117/60 (!) 115/48 124/69  Pulse: 62 62 63 63  Resp: 15 14 15 14   Temp:      TempSrc:      SpO2: 96% 95% 94% 97%  Weight:      Height:       Physical Exam Vitals reviewed.  Constitutional:      Appearance: She is obese. She is ill-appearing.  HENT:     Right Ear: Hearing normal.     Left Ear: Hearing and external ear normal.     Ears:     Comments: RIGHT EAR TICK.      Nose: Nasal tenderness, mucosal edema and congestion present.     Right Turbinates: Enlarged, swollen and pale.      Mouth/Throat:     Lips: Lesions present.     Mouth: Mucous membranes are moist. Angioedema present.     Dentition: Abnormal dentition.     Tongue: Tongue does not deviate from midline.     Pharynx: Oropharynx is clear.   Eyes:     Extraocular Movements: Extraocular movements intact.     Pupils: Pupils are equal, round, and reactive to light.  Cardiovascular:     Rate and Rhythm: Normal rate and regular rhythm.     Pulses: Normal pulses.     Heart sounds: Normal heart sounds.  Pulmonary:     Effort: Pulmonary effort is normal.     Breath sounds: Normal breath sounds.  Abdominal:     General: Bowel sounds are normal. There is no distension.     Palpations: Abdomen is soft. There is no mass.     Tenderness: There is no abdominal tenderness. There is no guarding.  Skin:    General: Skin is warm.  Neurological:     General: No focal deficit present.     Mental Status: She is oriented to person, place, and time.  Psychiatric:        Mood and Affect:  Mood normal.        Behavior: Behavior normal.     Labs on Admission: I have personally reviewed following labs and imaging studies  CBC: Recent Labs  Lab 09/03/23 1732  WBC 12.2*  NEUTROABS 9.7*  HGB 13.3  HCT 41.1  MCV 97.2  PLT 168    Basic Metabolic Panel: Recent Labs  Lab 09/03/23 1732  NA 136  K 4.0  CL 98  CO2 26  GLUCOSE 126*  BUN 15  CREATININE 0.56  CALCIUM 8.9   GFR: Estimated Creatinine Clearance: 69 mL/min (by C-G formula based on SCr of 0.56 mg/dL). Liver Function Tests: Recent Labs  Lab 09/03/23 1732  AST 26  ALT 34  ALKPHOS 75  BILITOT 0.9  PROT 7.7  ALBUMIN 4.0   No results for input(s): "LIPASE", "AMYLASE" in the last 168 hours. No results for input(s): "AMMONIA" in the last 168 hours. Coagulation Profile: No results for input(s): "INR", "PROTIME" in the last 168 hours. Cardiac Enzymes: No results for input(s): "CKTOTAL", "CKMB", "CKMBINDEX", "TROPONINI" in the last 168 hours. BNP (last 3 results) No results for input(s): "PROBNP" in the last 8760 hours. HbA1C: No results for input(s): "HGBA1C" in the last 72 hours. CBG: Recent Labs  Lab 09/03/23 2326  GLUCAP 193*   Lipid Profile: No results for input(s): "CHOL", "HDL", "LDLCALC", "TRIG", "CHOLHDL", "LDLDIRECT" in the last 72 hours. Thyroid Function Tests: No results for input(s): "TSH", "T4TOTAL", "FREET4", "T3FREE", "THYROIDAB" in the last 72 hours. Anemia Panel: No results for input(s): "VITAMINB12", "FOLATE", "FERRITIN", "TIBC", "IRON", "RETICCTPCT" in the last 72 hours. Urinalysis    Component Value Date/Time   COLORURINE YELLOW (A) 02/08/2022 0542   APPEARANCEUR CLEAR (A) 02/08/2022 0542   APPEARANCEUR Clear 12/03/2020 1033   LABSPEC 1.038 (H) 02/08/2022 0542   PHURINE 6.0 02/08/2022 0542   GLUCOSEU NEGATIVE 02/08/2022 0542   HGBUR NEGATIVE 02/08/2022 0542   BILIRUBINUR NEGATIVE 02/08/2022 0542   BILIRUBINUR Negative 12/03/2020 1033   KETONESUR NEGATIVE 02/08/2022 0542   PROTEINUR NEGATIVE 02/08/2022 0542   NITRITE NEGATIVE 02/08/2022 0542   LEUKOCYTESUR MODERATE (A) 02/08/2022 0542    Unresulted Labs (From admission, onward)     Start     Ordered   09/04/23 0500  Comprehensive metabolic panel  Tomorrow  morning,   R        09/03/23 2204   09/04/23 0500  CBC  Tomorrow morning,   R        09/03/23 2204   09/04/23 0412  C Difficile Quick Screen w PCR reflex  (C Difficile quick screen w PCR reflex panel )  Once, for 24 hours,   TIMED       References:    CDiff Information Tool   09/04/23 0411   09/03/23 2157  Hemoglobin A1c  Once,   R       Comments: To assess prior glycemic control    09/03/23 2204            Medications  amiodarone (PACERONE) tablet 200 mg (has no administration in time range)  apixaban (ELIQUIS) tablet 5 mg (5 mg Oral Given 09/04/23 0032)  letrozole [facility]) tablet 2.5 mg (has no administration in time range)  levETIRAcetam (KEPPRA) tablet 500 mg (500 mg Oral Given 09/04/23 0032)  pregabalin (LYRICA) capsule 100 mg (100 mg Oral Given 09/04/23 0032)  pantoprazole (PROTONIX) EC tablet 40 mg (has no administration in time range)  mirabegron ER (MYRBETRIQ) tablet 50 mg (has no administration in time range)  atorvastatin (LIPITOR) tablet 80 mg (has no administration in time range)  dorzolamide-timolol (COSOPT) 2-0.5 % ophthalmic solution 1 drop (1 drop Both Eyes Given 09/04/23 0033)  DULoxetine (CYMBALTA) DR capsule 60 mg (has no administration in time range)  latanoprost (XALATAN) 0.005 % ophthalmic solution 1 drop (1 drop Both Eyes Given 09/04/23 0033)  insulin aspart (novoLOG) injection 0-15 Units (has no administration in time range)  lactated ringers infusion ( Intravenous Infusion Verify 09/04/23 0400)  acetaminophen (TYLENOL) tablet 650 mg (has no administration in time range)    Or  acetaminophen (TYLENOL) suppository 650 mg (has no administration in time range)  ondansetron (ZOFRAN) tablet 4 mg (has no administration in time range)    Or  ondansetron (ZOFRAN) injection 4 mg (has no administration in time range)  hydrALAZINE (APRESOLINE) injection 5 mg (has no administration in time range)  famotidine (PEPCID) IVPB 20 mg premix (0 mg Intravenous Paused 09/04/23  0059)  methylPREDNISolone sodium succinate (SOLU-MEDROL) 40 mg/mL injection 40 mg (40 mg Intravenous Given 09/04/23 0029)  diphenhydrAMINE (BENADRYL) injection 25 mg (25 mg Intravenous Given 09/04/23 0029)  chlorhexidine (PERIDEX) 0.12 % solution 15 mL (15 mLs Mouth/Throat Given 09/04/23 0031)  ceFEPIme (MAXIPIME) 2 g in sodium chloride 0.9 % 100 mL IVPB (0 g Intravenous Stopped 09/04/23 0131)  vancomycin (VANCOREADY) IVPB 1250 mg/250 mL (has no administration in time range)  Chlorhexidine Gluconate Cloth 2 % PADS 6 each (has no administration in time range)  morphine (PF) 2 MG/ML injection 1 mg (1 mg Intravenous Given 09/04/23 0029)  feeding supplement (ENSURE ENLIVE / ENSURE PLUS) liquid 237 mL (has no administration in time range)  alum & mag hydroxide-simeth (MAALOX/MYLANTA) 200-200-20 MG/5ML suspension 30 mL (has no administration in time range)  Oral care mouth rinse (has no administration in time range)  Oral care mouth rinse (has no administration in time range)  mupirocin ointment (BACTROBAN) 2 % 1 Application (has no administration in time range)  diltiazem (CARDIZEM CD) 24 hr capsule 180 mg (has no administration in time range)  Ampicillin-Sulbactam (UNASYN) 3 g in sodium chloride 0.9 % 100 mL IVPB (0 g Intravenous Stopped 09/03/23 1824)  dexamethasone (DECADRON) injection 10 mg (10 mg Intravenous Given 09/03/23 1725)  diphenhydrAMINE (BENADRYL) injection 25 mg (25 mg Intravenous Given 09/03/23 1724)  iohexol (OMNIPAQUE) 300 MG/ML solution 75 mL (75 mLs Intravenous Contrast Given 09/03/23 1829)  acetaminophen (TYLENOL) 160 MG/5ML solution 650 mg (650 mg Oral Given 09/03/23 1958)  vancomycin (VANCOCIN) IVPB 1000 mg/200 mL premix (0 mg Intravenous Stopped 09/03/23 2223)    Followed by  vancomycin (VANCOCIN) IVPB 1000 mg/200 mL premix (0 mg Intravenous Stopped 09/03/23 2222)    Radiological Exams on Admission: CT Maxillofacial W Contrast  Result Date: 09/03/2023 CLINICAL DATA:  Facial swelling  EXAM: CT MAXILLOFACIAL WITH CONTRAST TECHNIQUE: Multidetector CT imaging of the maxillofacial structures was performed with intravenous contrast. Multiplanar CT image reconstructions were also generated. RADIATION DOSE REDUCTION: This exam was performed according to the departmental dose-optimization program which includes automated exposure control, adjustment of the mA and/or kV according to patient size and/or use of iterative reconstruction technique. CONTRAST:  75mL OMNIPAQUE IOHEXOL 300 MG/ML  SOLN COMPARISON:  06/24/2023 FINDINGS: Osseous: Poor dentition with multiple apical lucencies of maxilla, greatest at the root the most posterior remaining tooth on the left. Orbits: Negative. No traumatic or inflammatory finding. Sinuses: Polypoid soft tissue within the left nasal cavity and upper ethmoid sinus, unchanged since 06/24/2023 Soft tissues: Marked edema of the upper lip with  inflammatory change in the subcutaneous fat of the lower face bilaterally. No abscess or drainable fluid collection. Limited intracranial: No significant or unexpected finding. IMPRESSION: 1. Marked edema of the upper lip with inflammatory change in the subcutaneous fat of the lower face bilaterally. No abscess or drainable fluid collection. 2. Poor dentition with multiple apical lucencies of maxilla, greatest at the root the most posterior remaining tooth on the left. 3. Polypoid soft tissue within the left nasal cavity and upper ethmoid sinus, unchanged since 06/24/2023. Electronically Signed   By: Deatra Robinson M.D.   On: 09/03/2023 20:27     Data Reviewed: Relevant notes from primary care and specialist visits, past discharge summaries as available in EHR, including Care Everywhere. Prior diagnostic testing as pertinent to current admission diagnoses Updated medications and problem lists for reconciliation ED course, including vitals, labs, imaging, treatment and response to treatment Triage notes, nursing and pharmacy notes  and ED provider's notes Notable results as noted in HPI  Assessment and Plan: * Angioedema of lips    Patient also started on combination of Solu-Medrol Benadryl Pepcid.  Infection of lip Patient has lip swelling that is concerning for angioedema but as well as cellulitis suspect this is combination of sinus infection as well. Will continue patient on broad-spectrum coverage with vancomycin for MRSA coverage and cefepime.  Embedded tick of ear, right, initial encounter Take removed from right ear.  Paroxysmal atrial fibrillation (HCC) Sinus rhythm on auscultation. Continue amiodarone, Eliquis.  And diltiazem.  T2DM (type 2 diabetes mellitus) (HCC) Glycemic protocol and Accu-Cheks. Soft diet as patient is not able to completely open her mouth. Hold home regimen of Jardiance and metformin.  Chronic heart failure with preserved ejection fraction [facility]) Patient is euvolemic, no edema rales or crackles. Will currently hold patient's Lasix continue her on amiodarone, diltiazem, as needed hydralazine.  Essential (primary) hypertension Vitals:   09/04/23 0115 09/04/23 0130 09/04/23 0145 09/04/23 0200  BP: 115/69 (!) 113/59 (!) 124/46 (!) 118/56   09/04/23 0215 09/04/23 0230 09/04/23 0245 09/04/23 0300  BP: (!) 119/59 116/62 (!) 117/59 118/62   09/04/23 0315 09/04/23 0330 09/04/23 0345 09/04/23 0400  BP: 122/65 117/60 (!) 115/48 124/69  Will continue patient on her home regimen of Cardizem but does at 180 , d/c the Lasix, as needed hydralazine.    History of seizure disorder Pt placed on seizure precaution.  Keppra continued.   Primary open angle glaucoma (POAG) of left eye, mild stage Patient continued on Cosopt and Xalatan.  No complaints of any blurred vision or visual issues.   DVT prophylaxis:  Eliquis  Consults:  ENT Dr. Andee Poles  Advance Care Planning:    Code Status: Full Code   Family Communication:  None  Disposition Plan:  Home  Severity of  Illness: Inpatient the appropriate patient status for this patient is INPATIENT. Inpatient status is judged to be reasonable and necessary in order to provide the required intensity of service to ensure the patient's safety. The patient's presenting symptoms, physical exam findings, and initial radiographic and laboratory data in the context of their chronic comorbidities is felt to place them at high risk for further clinical deterioration. Furthermore, it is not anticipated that the patient will be medically stable for discharge from the hospital within 2 midnights of admission.   * I certify that at the point of admission it is my clinical judgment that the patient will require inpatient hospital care spanning beyond 2 midnights from the point of admission due  to high intensity of service, high risk for further deterioration and high frequency of surveillance required.*  Author: Gertha Calkin, MD 09/04/2023 4:38 AM  For on call review [email or web address].

## 2023-09-03 NOTE — ED Notes (Signed)
Report called to Abran Duke in ICU at this time.

## 2023-09-03 NOTE — ED Notes (Signed)
Report received from [facility] and assumed care of patient. Lactic acid drawn and sent to lab. Notified Modesto Charon, MD of patient's fever and borderline O2 sats. Patient denies feeling worse but does not endorse feeling better.

## 2023-09-03 NOTE — ED Provider Notes (Signed)
[facility] Provider Note    Event Date/Time   First MD Initiated Contact with Patient 09/03/23 1628     (approximate)   History   Facial Swelling   HPI  Rhonda Thornton is a 72 y.o. female   Past medical history of breast cancer, chronic pain, hypertension hyperlipidemia, diabetes who presents to the emergency department with upper lip swelling.  She for started with a pustule in the right nares about 5 days ago which was painful red swollen and then yesterday awoke with upper lip swelling.  Denies fever or chills.  No history of angioedema, neither hereditary nor is she on ACE inhibitor's or ARB's.  External Medical Documents Reviewed: Office visit from clinic this afternoon noting swelling to the face and advised to come to the emergency department.      Physical Exam   Triage Vital Signs: ED Triage Vitals  Encounter Vitals Group     BP 09/03/23 1615 125/65     Systolic BP Percentile --      Diastolic BP Percentile --      Pulse Rate 09/03/23 1615 71     Resp 09/03/23 1615 20     Temp 09/03/23 1615 99.1 F (37.3 C)     Temp Source 09/03/23 1615 Oral     SpO2 --      Weight 09/03/23 1616 212 lb (96.2 kg)     Height 09/03/23 1616 5\' 3"  (1.6 m)     Head Circumference --      Peak Flow --      Pain Score 09/03/23 1616 8     Pain Loc --      Pain Education --      Exclude from Growth Chart --     Most recent vital signs: Vitals:   09/03/23 2230 09/03/23 2300  BP: 118/65 123/68  Pulse: 69 66  Resp: 18 17  Temp:    SpO2: 94% 95%    General: Awake, no distress.  CV:  Good peripheral perfusion.  Resp:  Normal effort.  Abd:  No distention.  Other:  Marked upper lip swelling.  Pustule to the right nares.  Phonation normal maintaining airway maintaining secretions and no tongue or oropharyngeal involvement other than the upper lip.  Lungs clear.  No fever.   ED Results / Procedures / Treatments   Labs (all labs ordered are listed,  but only abnormal results are displayed) Labs Reviewed  COMPREHENSIVE METABOLIC PANEL - Abnormal; Notable for the following components:      Result Value   Glucose, Bld 126 (*)    All other components within normal limits  CBC WITH DIFFERENTIAL/PLATELET - Abnormal; Notable for the following components:   WBC 12.2 (*)    Neutro Abs 9.7 (*)    Monocytes Absolute 1.2 (*)    Abs Immature Granulocytes 0.08 (*)    All other components within normal limits  GLUCOSE, CAPILLARY - Abnormal; Notable for the following components:   Glucose-Capillary 193 (*)    All other components within normal limits  CULTURE, BLOOD (ROUTINE X 2)  CULTURE, BLOOD (ROUTINE X 2)  MRSA NEXT GEN BY PCR, NASAL  LACTIC ACID, PLASMA  LACTIC ACID, PLASMA  HEMOGLOBIN A1C  COMPREHENSIVE METABOLIC PANEL  CBC     I ordered and reviewed the above labs they are notable for lactic normal.  White blood cell count 12.2    RADIOLOGY I independently reviewed and interpreted the ct maxillofacial and see no  obvious fluid collection I also reviewed radiologist's formal read.   PROCEDURES:  Critical Care performed: No  .Foreign Body Removal  Date/Time: 09/03/2023 11:30 PM  Performed by: Pilar Jarvis, MD Authorized by: Pilar Jarvis, MD  Consent: Verbal consent obtained. Consent given by: patient Patient understanding: patient states understanding of the procedure being performed Patient identity confirmed: verbally with patient Body area: ear Location details: right ear  Sedation: Patient sedated: no  Patient restrained: no Patient cooperative: yes Localization method: visualized Removal mechanism: forceps Complexity: simple 1 objects recovered. Objects recovered: tick Post-procedure assessment: foreign body removed Patient tolerance: patient tolerated the procedure well with no immediate complications     MEDICATIONS ORDERED IN ED: Medications  amiodarone (PACERONE) tablet 200 mg (has no administration  in time range)  apixaban (ELIQUIS) tablet 5 mg (has no administration in time range)  diltiazem (CARDIZEM CD) 24 hr capsule 240 mg (has no administration in time range)  letrozole [facility]) tablet 2.5 mg (has no administration in time range)  levETIRAcetam (KEPPRA) tablet 500 mg (has no administration in time range)  pregabalin (LYRICA) capsule 100 mg (has no administration in time range)  pantoprazole (PROTONIX) EC tablet 40 mg (has no administration in time range)  mirabegron ER (MYRBETRIQ) tablet 50 mg (has no administration in time range)  atorvastatin (LIPITOR) tablet 80 mg (has no administration in time range)  dorzolamide-timolol (COSOPT) 2-0.5 % ophthalmic solution 1 drop (has no administration in time range)  DULoxetine (CYMBALTA) DR capsule 60 mg (has no administration in time range)  furosemide (LASIX) tablet 20 mg (has no administration in time range)  latanoprost (XALATAN) 0.005 % ophthalmic solution 1 drop (has no administration in time range)  insulin aspart (novoLOG) injection 0-15 Units (has no administration in time range)  lactated ringers infusion (has no administration in time range)  acetaminophen (TYLENOL) tablet 650 mg (has no administration in time range)    Or  acetaminophen (TYLENOL) suppository 650 mg (has no administration in time range)  ondansetron (ZOFRAN) tablet 4 mg (has no administration in time range)    Or  ondansetron (ZOFRAN) injection 4 mg (has no administration in time range)  hydrALAZINE (APRESOLINE) injection 5 mg (has no administration in time range)  famotidine (PEPCID) IVPB 20 mg premix (has no administration in time range)  methylPREDNISolone sodium succinate (SOLU-MEDROL) 40 mg/mL injection 40 mg (has no administration in time range)  diphenhydrAMINE (BENADRYL) injection 25 mg (has no administration in time range)  chlorhexidine (PERIDEX) 0.12 % solution 15 mL (has no administration in time range)  ceFEPIme (MAXIPIME) 2 g in sodium chloride 0.9  % 100 mL IVPB (has no administration in time range)  vancomycin (VANCOREADY) IVPB 1250 mg/250 mL (has no administration in time range)  Chlorhexidine Gluconate Cloth 2 % PADS 6 each (has no administration in time range)  Ampicillin-Sulbactam (UNASYN) 3 g in sodium chloride 0.9 % 100 mL IVPB (0 g Intravenous Stopped 09/03/23 1824)  dexamethasone (DECADRON) injection 10 mg (10 mg Intravenous Given 09/03/23 1725)  diphenhydrAMINE (BENADRYL) injection 25 mg (25 mg Intravenous Given 09/03/23 1724)  iohexol (OMNIPAQUE) 300 MG/ML solution 75 mL (75 mLs Intravenous Contrast Given 09/03/23 1829)  acetaminophen (TYLENOL) 160 MG/5ML solution 650 mg (650 mg Oral Given 09/03/23 1958)  vancomycin (VANCOCIN) IVPB 1000 mg/200 mL premix (0 mg Intravenous Stopped 09/03/23 2223)    Followed by  vancomycin (VANCOCIN) IVPB 1000 mg/200 mL premix (0 mg Intravenous Stopped 09/03/23 2222)    External physician / consultants:  I spoke with ENT  consultant and hospitalist regarding admission and regarding care plan for this patient.   IMPRESSION / MDM / ASSESSMENT AND PLAN / ED COURSE  I reviewed the triage vital signs and the nursing notes.                                Patient's presentation is most consistent with acute presentation with potential threat to life or bodily function.  Differential diagnosis includes, but is not limited to, angioedema, facial cellulitis, facial abscess   The patient is on the cardiac monitor to evaluate for evidence of arrhythmia and/or significant heart rate changes.  MDM:    Patient with upper lip swelling and pustule in the nose likely source also could be odontogenic given her poor dentition.  Fortunately oropharynx not involved and airway not obstructed, checked on multiple times throughout emergency department stay and remains stable.  ENT consulted will evaluate patient in the morning.  No drainable abscess on CT.  Started on Unasyn, steroids.  Later found a tick in her right  ear which I removed.      FINAL CLINICAL IMPRESSION(S) / ED DIAGNOSES   Final diagnoses:  Facial cellulitis  Tick bite of right ear, initial encounter     Rx / DC Orders   ED Discharge Orders     None        Note:  This document was prepared using Dragon voice recognition software and may include unintentional dictation errors.    Pilar Jarvis, MD 09/03/23 [phone]

## 2023-09-04 DIAGNOSIS — A419 Sepsis, unspecified organism: Secondary | ICD-10-CM | POA: Diagnosis not present

## 2023-09-04 DIAGNOSIS — L03211 Cellulitis of face: Secondary | ICD-10-CM | POA: Diagnosis not present

## 2023-09-04 DIAGNOSIS — Z8669 Personal history of other diseases of the nervous system and sense organs: Secondary | ICD-10-CM

## 2023-09-04 DIAGNOSIS — K130 Diseases of lips: Secondary | ICD-10-CM

## 2023-09-04 LAB — CBC
HCT: 39.2 % (ref 36.0–46.0)
Hemoglobin: 12.9 g/dL (ref 12.0–15.0)
MCH: 31.4 pg (ref 26.0–34.0)
MCHC: 32.9 g/dL (ref 30.0–36.0)
MCV: 95.4 fL (ref 80.0–100.0)
Platelets: 144 10*3/uL — ABNORMAL LOW (ref 150–400)
RBC: 4.11 MIL/uL (ref 3.87–5.11)
RDW: 14 % (ref 11.5–15.5)
WBC: 13.2 10*3/uL — ABNORMAL HIGH (ref 4.0–10.5)
nRBC: 0 % (ref 0.0–0.2)

## 2023-09-04 LAB — COMPREHENSIVE METABOLIC PANEL
ALT: 48 U/L — ABNORMAL HIGH (ref 0–44)
AST: 41 U/L (ref 15–41)
Albumin: 3.8 g/dL (ref 3.5–5.0)
Alkaline Phosphatase: 67 U/L (ref 38–126)
Anion gap: 11 (ref 5–15)
BUN: 16 mg/dL (ref 8–23)
CO2: 22 mmol/L (ref 22–32)
Calcium: 8.8 mg/dL — ABNORMAL LOW (ref 8.9–10.3)
Chloride: 104 mmol/L (ref 98–111)
Creatinine, Ser: 0.48 mg/dL (ref 0.44–1.00)
GFR, Estimated: >60 mL/min (ref >60)
Glucose, Bld: 157 mg/dL — ABNORMAL HIGH (ref 70–99)
Potassium: 3.6 mmol/L (ref 3.5–5.1)
Sodium: 137 mmol/L (ref 135–145)
Total Bilirubin: 1.1 mg/dL (ref 0.3–1.2)
Total Protein: 7.4 g/dL (ref 6.5–8.1)

## 2023-09-04 LAB — GLUCOSE, CAPILLARY
Glucose-Capillary: 149 mg/dL — ABNORMAL HIGH (ref 70–99)
Glucose-Capillary: 237 mg/dL — ABNORMAL HIGH (ref 70–99)
Glucose-Capillary: 231 mg/dL — ABNORMAL HIGH (ref 70–99)
Glucose-Capillary: 247 mg/dL — ABNORMAL HIGH (ref 70–99)

## 2023-09-04 LAB — LACTIC ACID, PLASMA: Lactic Acid, Venous: 1.1 mmol/L (ref 0.5–1.9)

## 2023-09-04 LAB — MRSA NEXT GEN BY PCR, NASAL: MRSA by PCR Next Gen: DETECTED — AB

## 2023-09-04 MED ORDER — ADULT MULTIVITAMIN W/MINERALS CH
1.0000 | ORAL_TABLET | Freq: Every day | ORAL | Status: DC
  Administered 2023-09-05 – 2023-09-07 (×3): 1 via ORAL
  Filled 2023-09-04 (×3): qty 1

## 2023-09-04 MED ORDER — DILTIAZEM HCL ER COATED BEADS 180 MG PO CP24
180.0000 mg | ORAL_CAPSULE | Freq: Every day | ORAL | Status: DC
  Administered 2023-09-05 – 2023-09-07 (×3): 180 mg via ORAL
  Filled 2023-09-04 (×4): qty 1

## 2023-09-04 MED ORDER — MUPIROCIN 2 % EX OINT
1.0000 | TOPICAL_OINTMENT | Freq: Two times a day (BID) | CUTANEOUS | Status: DC
  Administered 2023-09-04 – 2023-09-07 (×7): 1 via NASAL
  Filled 2023-09-04: qty 22

## 2023-09-04 MED ORDER — ALUM & MAG HYDROXIDE-SIMETH 200-200-20 MG/5ML PO SUSP
30.0000 mL | Freq: Four times a day (QID) | ORAL | Status: DC | PRN
  Administered 2023-09-05 (×2): 30 mL via ORAL
  Filled 2023-09-04 (×2): qty 30

## 2023-09-04 MED ORDER — ENSURE ENLIVE PO LIQD
237.0000 mL | Freq: Two times a day (BID) | ORAL | Status: DC
  Administered 2023-09-04: 237 mL via ORAL

## 2023-09-04 MED ORDER — ORAL CARE MOUTH RINSE: 15.0000 mL | OROMUCOSAL | Status: DC | PRN

## 2023-09-04 MED ORDER — ORAL CARE MOUTH RINSE
15.0000 mL | OROMUCOSAL | Status: DC
  Administered 2023-09-04 – 2023-09-07 (×13): 15 mL via OROMUCOSAL

## 2023-09-04 MED ORDER — MORPHINE SULFATE (PF) 2 MG/ML IV SOLN
1.0000 mg | INTRAVENOUS | Status: DC | PRN
  Administered 2023-09-04: 1 mg via INTRAVENOUS
  Filled 2023-09-04: qty 1

## 2023-09-04 MED ORDER — GENTAMICIN SULFATE 0.1 % EX OINT
TOPICAL_OINTMENT | Freq: Three times a day (TID) | CUTANEOUS | Status: DC
  Filled 2023-09-04: qty 15

## 2023-09-04 MED ORDER — ENSURE ENLIVE PO LIQD
237.0000 mL | Freq: Four times a day (QID) | ORAL | Status: DC
  Administered 2023-09-04 – 2023-09-06 (×8): 237 mL via ORAL

## 2023-09-04 NOTE — Assessment & Plan Note (Signed)
Patient has lip swelling that is concerning for angioedema but as well as cellulitis suspect this is combination of sinus infection as well. Will continue patient on broad-spectrum coverage with vancomycin for MRSA coverage and cefepime.

## 2023-09-04 NOTE — Progress Notes (Signed)
Initial Nutrition Assessment  DOCUMENTATION CODES:   Obesity unspecified  INTERVENTION:   Ensure Enlive po QID, each supplement provides 350 kcal and 20 grams of protein.  Magic cup TID with meals, each supplement provides 290 kcal and 9 grams of protein  MVI po daily   Pt at high refeed risk; recommend monitor potassium, magnesium and phosphorus labs daily until stable  Daily weights   NUTRITION DIAGNOSIS:   Inadequate oral intake related to acute illness as evidenced by per patient/family report.  GOAL:   Patient will meet greater than or equal to 90% of their needs  MONITOR:   PO intake, Supplement acceptance, Labs, Weight trends, I & O's, Skin  REASON FOR ASSESSMENT:   Malnutrition Screening Tool    ASSESSMENT:   72 y/o female with h/o CHF, PAF, DM, seizures, breast cancer,  aortic valve stenosis, gastroparesis, HLD, OSA, SAH, depression, GERD, DDD and chronic pain who is admitted with sepsis, angioedema of lips and facial cellulitis.  Met with pt in room today. Pt reports good appetite and oral intake at baseline but reports decreased oral intake for several days r/t facial swelling and difficulty chewing. Pt reports that she was able to eat some breakfast this morning; pt documented to have eaten 100% of breakfast. RD discussed with pt the importance of adequate nutrition needed to preserve lean muscle. Pt reports that she likes Ensure and she is willing to drink this to help her meet her estimated needs until she is able to eat normally. RD will add supplements and MVI. Pt is likely at refeed risk. Per chart, pt appears weight stable at baseline.   Medications reviewed and include: insulin, protonix, cefepime, pepcid, vancomycin   Labs reviewed: K 3.6 wnl Wbc- 13.2(H) Cbgs- 237, 149 x 24 hrs   NUTRITION - FOCUSED PHYSICAL EXAM:  Flowsheet Row Most Recent Value  Orbital Region No depletion  Upper Arm Region No depletion  Thoracic and Lumbar Region No  depletion  Buccal Region No depletion  Temple Region No depletion  Clavicle Bone Region No depletion  Clavicle and Acromion Bone Region No depletion  Scapular Bone Region No depletion  Dorsal Hand No depletion  Patellar Region No depletion  Anterior Thigh Region No depletion  Posterior Calf Region No depletion  Edema (RD Assessment) None  Hair Reviewed  Eyes Reviewed  Mouth Reviewed  Skin Reviewed  Nails Reviewed   Diet Order:   Diet Order             DIET SOFT Room service appropriate? Yes; Fluid consistency: Thin  Diet effective now                  EDUCATION NEEDS:   Education needs have been addressed  Skin:  Skin Assessment: Reviewed RN Assessment  Last BM:  9/13- type 4  Height:   Ht Readings from Last 1 Encounters:  09/03/23 5\' 3"  (1.6 m)    Weight:   Wt Readings from Last 1 Encounters:  09/03/23 95.9 kg    Ideal Body Weight:  52.2 kg  BMI:  Body mass index is 37.45 kg/m.  Estimated Nutritional Needs:   Kcal:  1800-2100kcal/day  Protein:  90-105g/day  Fluid:  1.4-1.6L/day  Betsey Holiday MS, RD, LDN Please refer to [facility] for RD and/or RD on-call/weekend/after hours pager

## 2023-09-04 NOTE — Assessment & Plan Note (Signed)
Take removed from right ear.

## 2023-09-04 NOTE — Consult Note (Signed)
..Rhonda Thornton, Floberg [id number] [birth year] Rhonda Shadow, MD  Reason for Consult: facial swelling  HPI: Patient is a 72 year old female who presented to ER with upper lip swelling.  Initially began as a pusutle and sore in nose and then began to spread to her upper lip.  She wears oxygen at home at night time and reports some drying from this.  She reports a history of MRSA in the past.  She reports right sided nares swelling 5 days ago but then on 9/11 woke up with sudden onset of upper lip swelling.  No fever or chills.  No ACE-I or history of angioedema in past.  No new change in breathing difficulty.  She does have some speech diffculty due to upper lip swelling.  History of poor dentition and reports maybe losing a few teeth recently.  Admissted 06/2023 due to fall but no recent fall or trauma to face.  She is on a blood thinner.  Allergies: No Known Allergies  ROS: Review of systems normal other than 12 systems except per HPI.  PMH:  Past Medical History:  Diagnosis Date   Anemia    Anginal pain (HCC)    Arthritis    Breast cancer (HCC)    2022 [facility]   Chronic pain syndrome    Degenerative disc disease, lumbar    Edema of left lower extremity    Gastroparesis due to secondary diabetes (HCC)    GERD (gastroesophageal reflux disease)    Heart murmur    History of seizure disorder    Hyperlipidemia    Hypertension    Invasive carcinoma of breast (HCC) 04/25/2021   LEFT; stage 1A; grade I invasive mammary carcinoma; ER/PR (+); HER2/neu (-)   Mild aortic stenosis    Mild mitral regurgitation    Mild tricuspid regurgitation    Morbid obesity (HCC)    Opioid use    therapeutic use for Dx of chronic pain syndrome   OSA (obstructive sleep apnea)    O2 at night   Personal history of radiation therapy    Recurrent falls while walking    uses rolling walker    Requires supplemental oxygen    2L/[place] at bedtime   T2DM (type 2 diabetes mellitus) (HCC)    Tubular adenoma of colon     FH:   Family History  Problem Relation Age of Onset   Stomach cancer Mother    Colon cancer Brother    Breast cancer Neg Hx     SH:  Social History   Socioeconomic History   Marital status: Widowed    Spouse name: Not on file   Number of children: 0   Years of education: Not on file   Highest education level: Not on file  Occupational History   Not on file  Tobacco Use   Smoking status: Never   Smokeless tobacco: Never  Vaping Use   Vaping status: Never Used  Substance and Sexual Activity   Alcohol use: No   Drug use: Never   Sexual activity: Not on file  Other Topics Concern   Not on file  Social History Narrative   Not on file   Social Determinants of Health   Financial Resource Strain: Medium Risk (07/14/2022)   [facility]   Overall Financial Resource Strain (CARDIA)    Difficulty of Paying Living Expenses: Somewhat hard  Food Insecurity: No Food Insecurity (09/03/2023)   Hunger Vital Sign    Worried About  Running Out of Food in the Last Year: Never true    Ran Out of Food in the Last Year: Never true  Transportation Needs: No Transportation Needs (09/03/2023)   PRAPARE - Administrator, Civil Service (Medical): No    Lack of Transportation (Non-Medical): No  Physical Activity: Not on file  Stress: Not on file  Social Connections: Not on file  Intimate Partner Violence: Not At Risk (09/03/2023)   Humiliation, Afraid, Rape, and Kick questionnaire    Fear of Current or Ex-Partner: No    Emotionally Abused: No    Physically Abused: No    Sexually Abused: No    PSH:  Past Surgical History:  Procedure Laterality Date   ABDOMINAL HYSTERECTOMY  1991   partial   BREAST BIOPSY Left 0505/2022   u/s bx-"X" clip-INVASIVE MAMMARY CARCINOMA WITH TUBULAR FEATURES   BREAST BIOPSY Right 05/05/2023   [place] Core Bx,-ribbon clip- path pending   BREAST BIOPSY Right 05/05/2023   [place] RT BREAST BX W LOC DEV 1ST LESION IMG BX SPEC US  GUIDE 05/05/2023 [facility]   BREAST CYST ASPIRATION Right 07/16/2016   neg   BREAST LUMPECTOMY,RADIO FREQ LOCALIZER,AXILLARY SENTINEL LYMPH NODE BIOPSY Left 05/22/2021   Procedure: BREAST LUMPECTOMY,RADIO FREQ LOCALIZER,AXILLARY SENTINEL LYMPH NODE BIOPSY;  Surgeon: Campbell Lerner, MD;  Location: [facility] ORS;  Service: General;  Laterality: Left;   CARDIOVERSION N/A 02/14/2022   Procedure: CARDIOVERSION;  Surgeon: Antonieta Iba, MD;  Location: [facility] ORS;  Service: Cardiovascular;  Laterality: N/A;  TEE and cardioversion   CHOLECYSTECTOMY     COLONOSCOPY WITH PROPOFOL N/A 07/30/2016   Procedure: COLONOSCOPY WITH PROPOFOL;  Surgeon: Christena Deem, MD;  [facility] END[facility];  Service: Endoscopy;  Laterality: N/A;   ESOPHAGOGASTRODUODENOSCOPY (EGD) WITH PROPOFOL N/A 09/09/2017   Procedure: ESOPHAGOGASTRODUODENOSCOPY (EGD) WITH PROPOFOL;  Surgeon: Toledo, Boykin Nearing, MD;  Location: [facility] ENDOSCOPY;  Service: Endoscopy;  Laterality: N/A;   EYE SURGERY Right    for pterygium   FUNCTIONAL ENDOSCOPIC SINUS SURGERY     TEE WITHOUT CARDIOVERSION N/A 02/14/2022   Procedure: TRANSESOPHAGEAL ECHOCARDIOGRAM (TEE);  Surgeon: Antonieta Iba, MD;  Location: [facility] ORS;  Service: Cardiovascular;  Laterality: N/A;   TONSILLECTOMY      Physical  Exam:  GEN- NAD sitting upright in bed talking on the phone NEURO- CN 2-12 grossly intact and symmetric. EARS- external ears clear NOSE- erythema and dried blood and swelling of anterior septum Right > Left.  Tenderness and softness to palpation.  No frank fluctuance OC/OP-  moderate upper lip swelling, soft and more consistent with edema and inflammation.  No sores on lip and no signs of trauma.  No fluctuance.  Very poor dentition with multiple dental fractures and caries but no obvious recent infection NECK- supple with no masses or lesions RESP- unlabored CARD-  good perfusion of extremities  CT-   Marked edema of the upper lip with inflammatory  change in the subcutaneous fat of the lower face bilaterally. No abscess or drainable fluid collection. 2. Poor dentition with multiple apical lucencies of maxilla, greatest at the root the most posterior remaining tooth on the left. 3. Polypoid soft tissue within the left nasal cavity and upper ethmoid sinus, unchanged since 06/24/2023.   A/P: Facial cellulitis from right nares, most likely MRSA  Plan:  No drainable fluid on CT scan no exam yet.  Soft edema of upper lip and active infection of septum/nares.  Recommend addition of Gentamicin ointment to bilateral nares and humidification  of oxygen in hospital and at home.  Continue MRSA coverage and once improving switch to PO.  Follow up early next week as outpatient at [facility].  Patient understands sometimes the septal infection will require I&D in the office.   Rhonda Thornton 09/04/2023 8:06 AM

## 2023-09-04 NOTE — Plan of Care (Signed)

## 2023-09-04 NOTE — Assessment & Plan Note (Addendum)
Vitals:   09/04/23 0115 09/04/23 0130 09/04/23 0145 09/04/23 0200  BP: 115/69 (!) 113/59 (!) 124/46 (!) 118/56   09/04/23 0215 09/04/23 0230 09/04/23 0245 09/04/23 0300  BP: (!) 119/59 116/62 (!) 117/59 118/62   09/04/23 0315 09/04/23 0330 09/04/23 0345 09/04/23 0400  BP: 122/65 117/60 (!) 115/48 124/69  Will continue patient on her home regimen of Cardizem but does at 180 , d/c the Lasix, as needed hydralazine.

## 2023-09-04 NOTE — Assessment & Plan Note (Signed)
Patient is euvolemic, no edema rales or crackles. Will currently hold patient's Lasix continue her on amiodarone, diltiazem, as needed hydralazine.

## 2023-09-04 NOTE — Assessment & Plan Note (Signed)
Sinus rhythm on auscultation. Continue amiodarone, Eliquis.  And diltiazem.

## 2023-09-04 NOTE — Progress Notes (Addendum)
Progress Note    Rhonda Thornton  NWG:[id number] DOB: [birth year]  DOA: 09/03/2023 PCP: Hillery Aldo, MD      Brief Narrative:    Medical records reviewed and are as summarized below:  Rhonda Thornton is a 72 y.o. female with medical history significant for type 2 diabetes mellitus, hypertension, chronic HFpEF, nonrheumatic aortic valve stenosis, atrial fibrillation on Eliquis, seizure disorder, history of breast cancer, primary open-angle glaucoma, obstructive sleep apnea, chronic hypoxic respiratory failure on home oxygen, recent traumatic subarachnoid hemorrhage secondary to accidental fall in July 2024.  She presented to the hospital because of facial and upper lip swelling of about 5 days duration.  She initially went to [facility] and was subsequently transferred to the emergency department for further management. She was febrile in the emergency department with temperature of 101.3 and tachypneic with respiratory rate up to 26. Wbc was slightly elevated at 12.2.   She was admitted to the hospital for sepsis secondary to facial cellulitis from right nares.  She was treated with empiric IV antibiotics and IV fluids.    Assessment/Plan:   Principal Problem:   Sepsis (HCC) Active Problems:   Angioedema of lips   Facial cellulitis   Paroxysmal atrial fibrillation (HCC)   Infection of lip   Embedded tick of ear, right, initial encounter   T2DM (type 2 diabetes mellitus) (HCC)   Chronic respiratory failure with hypoxia (HCC)   Chronic heart failure with preserved ejection fraction (HCC)   Essential (primary) hypertension   Primary open angle glaucoma (POAG) of left eye, mild stage   Nasal lesion   History of seizure disorder    Body mass index is 37.45 kg/m.   Sepsis secondary to facial cellulitis/infection of upper lip: Continue empiric IV antibiotics with cefepime and vancomycin.  Gentamicin ointment to bilateral nares.  MRSA screen was positive.  Discontinue IV  steroids. Appreciate input from Dr. Andee Poles, otolaryngologist.  Outpatient follow-up with [facility] in 1 week.   Paroxysmal atrial fibrillation: Continue amiodarone and Eliquis.   Seizure disorder: Continue Keppra   Chronic diastolic CHF: Compensated.   History of breast cancer: On letrozole   Tick was removed from the right ear in the ED on day of admission.    Other comorbidities include chronic hypoxic respiratory failure on 2 L/min oxygen at baseline, primary open-angle glaucoma,   Transfer from stepdown unit to MedSurg unit.   Diet Order             DIET SOFT Room service appropriate? Yes; Fluid consistency: Thin  Diet effective now                            Consultants: Otolaryngologist  Procedures: None    Medications:    amiodarone  200 mg Oral Daily   apixaban  5 mg Oral BID   atorvastatin  80 mg Oral Daily   chlorhexidine  15 mL Mouth/Throat BID   Chlorhexidine Gluconate Cloth  6 each Topical Daily   diltiazem  180 mg Oral Daily   diphenhydrAMINE  25 mg Intravenous Q12H   dorzolamide-timolol  1 drop Both Eyes BID   DULoxetine  60 mg Oral Daily   feeding supplement  237 mL Oral BID BM   gentamicin ointment   Topical TID   insulin aspart  0-15 Units Subcutaneous TID WC   latanoprost  1 drop Both Eyes QHS   letrozole  2.5 mg Oral  Daily   levETIRAcetam  500 mg Oral BID   mirabegron ER  50 mg Oral Daily   mupirocin ointment  1 Application Nasal BID   mouth rinse  15 mL Mouth Rinse 4 times per day   pantoprazole  40 mg Oral Daily   pregabalin  100 mg Oral TID   Continuous Infusions:  ceFEPime (MAXIPIME) IV Stopped (09/04/23 0548)   famotidine (PEPCID) IV 20 mg (09/04/23 0953)   lactated ringers 30 mL/hr at 09/04/23 0900   vancomycin       Anti-infectives (From admission, onward)    Start     Dose/Rate Route Frequency Ordered Stop   09/04/23 2100  vancomycin (VANCOREADY) IVPB 1250 mg/250 mL        1,250 mg 166.7 mL/hr over  90 Minutes Intravenous Every 24 hours 09/03/23 2237     09/03/23 2215  ceFEPIme (MAXIPIME) 2 g in sodium chloride 0.9 % 100 mL IVPB        2 g 200 mL/hr over 30 Minutes Intravenous Every 8 hours 09/03/23 2208     09/03/23 2115  vancomycin (VANCOCIN) IVPB 1000 mg/200 mL premix       Placed in "Followed by" Linked Group   1,000 mg 200 mL/hr over 60 Minutes Intravenous  Once 09/03/23 2107 09/03/23 2223   09/03/23 2115  vancomycin (VANCOCIN) IVPB 1000 mg/200 mL premix       Placed in "Followed by" Linked Group   1,000 mg 200 mL/hr over 60 Minutes Intravenous  Once 09/03/23 2107 09/03/23 2222   09/03/23 1645  Ampicillin-Sulbactam (UNASYN) 3 g in sodium chloride 0.9 % 100 mL IVPB        3 g 200 mL/hr over 30 Minutes Intravenous  Once 09/03/23 1636 09/03/23 1824              Family Communication/Anticipated D/C date and plan/Code Status   DVT prophylaxis:  apixaban (ELIQUIS) tablet 5 mg     Code Status: Full Code  Family Communication: None Disposition Plan: Plan to discharge home in 2 to 3 days   Status is: Inpatient Remains inpatient appropriate because: Sepsis from facial cellulitis       Subjective:   Interval events noted.  He complains of upper lip and facial swelling.  Pain in the upper lip is better.  No shortness of breath, wheezing, throat tightness or chest pain.  No nosebleed.  Objective:    Vitals:   09/04/23 0800 09/04/23 0900 09/04/23 0917 09/04/23 0930  BP: (!) 104/52 (!) 98/33 107/65 (!) 101/55  Pulse: 65 71 70 71  Resp: 19 (!) 23 17 18   Temp: 98 F (36.7 C)     TempSrc: Oral     SpO2: 93% 94% 93% 95%  Weight:      Height:       No data found.   Intake/Output Summary (Last 24 hours) at 09/04/2023 1026 Last data filed at 09/04/2023 0930 Gross per 24 hour  Intake 1337.72 ml  Output --  Net 1337.72 ml   Filed Weights   09/03/23 1616 09/03/23 2300  Weight: 96.2 kg 95.9 kg    Exam:  GEN: NAD SKIN: Warm and dry EYES: No pallor or  icterus ENT: MMM, facial swelling, swelling and erythema of upper lip, missing teeth.  Dried blood in right nares. CV: RRR PULM: CTA B ABD: soft, obese, NT, +BS CNS: AAO x 3, non focal EXT: No edema or tenderness     Data Reviewed:   I have  personally reviewed following labs and imaging studies:  Labs: Labs show the following:   Basic Metabolic Panel: Recent Labs  Lab 09/03/23 1732 09/04/23 0519  NA 136 137  K 4.0 3.6  CL 98 104  CO2 26 22  GLUCOSE 126* 157*  BUN 15 16  CREATININE 0.56 0.48  CALCIUM 8.9 8.8*   GFR Estimated Creatinine Clearance: 69 mL/min (by C-G formula based on SCr of 0.48 mg/dL). Liver Function Tests: Recent Labs  Lab 09/03/23 1732 09/04/23 0519  AST 26 41  ALT 34 48*  ALKPHOS 75 67  BILITOT 0.9 1.1  PROT 7.7 7.4  ALBUMIN 4.0 3.8   No results for input(s): "LIPASE", "AMYLASE" in the last 168 hours. No results for input(s): "AMMONIA" in the last 168 hours. Coagulation profile No results for input(s): "INR", "PROTIME" in the last 168 hours.  CBC: Recent Labs  Lab 09/03/23 1732 09/04/23 0519  WBC 12.2* 13.2*  NEUTROABS 9.7*  --   HGB 13.3 12.9  HCT 41.1 39.2  MCV 97.2 95.4  PLT 168 144*   Cardiac Enzymes: No results for input(s): "CKTOTAL", "CKMB", "CKMBINDEX", "TROPONINI" in the last 168 hours. BNP (last 3 results) No results for input(s): "PROBNP" in the last 8760 hours. CBG: Recent Labs  Lab 09/03/23 2326 09/04/23 0737  GLUCAP 193* 149*   D-Dimer: No results for input(s): "DDIMER" in the last 72 hours. Hgb A1c: No results for input(s): "HGBA1C" in the last 72 hours. Lipid Profile: No results for input(s): "CHOL", "HDL", "LDLCALC", "TRIG", "CHOLHDL", "LDLDIRECT" in the last 72 hours. Thyroid function studies: No results for input(s): "TSH", "T4TOTAL", "T3FREE", "THYROIDAB" in the last 72 hours.  Invalid input(s): "FREET3" Anemia work up: No results for input(s): "VITAMINB12", "FOLATE", "FERRITIN", "TIBC", "IRON",  "RETICCTPCT" in the last 72 hours. Sepsis Labs: Recent Labs  Lab 09/03/23 1732 09/03/23 1917 09/04/23 0022 09/04/23 0519  WBC 12.2*  --   --  13.2*  LATICACIDVEN  --  0.9 1.1  --     Microbiology Recent Results (from the past 240 hour(s))  MRSA Next Gen by PCR, Nasal     Status: Abnormal   Collection Time: 09/03/23 11:39 PM   Specimen: Nasal Mucosa; Nasal Swab  Result Value Ref Range Status   MRSA by PCR Next Gen DETECTED (A) NOT DETECTED Final    Comment: RESULT CALLED TO, READ BACK BY AND VERIFIED WITH: FRANCHESICA BELL @0210  ON 09/04/23 SKL (NOTE) The GeneXpert MRSA Assay (FDA approved for NASAL specimens only), is one component of a comprehensive MRSA colonization surveillance program. It is not intended to diagnose MRSA infection nor to guide or monitor treatment for MRSA infections. Test performance is not FDA approved in patients less than 30 years old. Performed at [facility], [address] [place], [place] [address]   Culture, blood (routine x 2)     Status: None (Preliminary result)   Collection Time: 09/04/23 12:23 AM   Specimen: BLOOD  Result Value Ref Range Status   Specimen Description BLOOD BLOOD RIGHT HAND  Final   Special Requests   Final    BOTTLES DRAWN AEROBIC AND ANAEROBIC Blood Culture adequate volume   Culture   Final    NO GROWTH < 12 HOURS Performed at [facility], [address] [place], [place] [address]    Report Status PENDING  Incomplete  Culture, blood (routine x 2)     Status: None (Preliminary result)   Collection Time: 09/04/23 12:23 AM   Specimen: BLOOD  Result Value Ref Range Status   Specimen Description BLOOD BLOOD RIGHT ARM  Final   Special Requests   Final    BOTTLES DRAWN AEROBIC AND ANAEROBIC Blood Culture adequate volume   Culture   Final    NO GROWTH < 12 HOURS Performed at [facility], [address] [place], [place] [address]    Report Status PENDING  Incomplete    Procedures and  diagnostic studies:  CT Maxillofacial W Contrast  Result Date: 09/03/2023 CLINICAL DATA:  Facial swelling EXAM: CT MAXILLOFACIAL WITH CONTRAST TECHNIQUE: Multidetector CT imaging of the maxillofacial structures was performed with intravenous contrast. Multiplanar CT image reconstructions were also generated. RADIATION DOSE REDUCTION: This exam was performed according to the departmental dose-optimization program which includes automated exposure control, adjustment of the mA and/or kV according to patient size and/or use of iterative reconstruction technique. CONTRAST:  75mL OMNIPAQUE IOHEXOL 300 MG/ML  SOLN COMPARISON:  06/24/2023 FINDINGS: Osseous: Poor dentition with multiple apical lucencies of maxilla, greatest at the root the most posterior remaining tooth on the left. Orbits: Negative. No traumatic or inflammatory finding. Sinuses: Polypoid soft tissue within the left nasal cavity and upper ethmoid sinus, unchanged since 06/24/2023 Soft tissues: Marked edema of the upper lip with inflammatory change in the subcutaneous fat of the lower face bilaterally. No abscess or drainable fluid collection. Limited intracranial: No significant or unexpected finding. IMPRESSION: 1. Marked edema of the upper lip with inflammatory change in the subcutaneous fat of the lower face bilaterally. No abscess or drainable fluid collection. 2. Poor dentition with multiple apical lucencies of maxilla, greatest at the root the most posterior remaining tooth on the left. 3. Polypoid soft tissue within the left nasal cavity and upper ethmoid sinus, unchanged since 06/24/2023. Electronically Signed   By: Deatra Robinson M.D.   On: 09/03/2023 20:27               LOS: 1 day   Demetris Capell  [facility]   Pager on [email or web address]. If 7PM-7AM, please contact night-coverage at [email or web address]     09/04/2023, 10:26 AM

## 2023-09-04 NOTE — Assessment & Plan Note (Addendum)
Glycemic protocol and Accu-Cheks. Soft diet as patient is not able to completely open her mouth. Hold home regimen of Jardiance and metformin.

## 2023-09-04 NOTE — Assessment & Plan Note (Signed)
Patient continued on Cosopt and Xalatan.  No complaints of any blurred vision or visual issues.

## 2023-09-04 NOTE — Assessment & Plan Note (Addendum)
    Patient also started on combination of Solu-Medrol Benadryl Pepcid.

## 2023-09-04 NOTE — Assessment & Plan Note (Addendum)
Pt placed on seizure precaution.  Keppra continued.

## 2023-09-05 DIAGNOSIS — A419 Sepsis, unspecified organism: Secondary | ICD-10-CM | POA: Diagnosis not present

## 2023-09-05 LAB — BASIC METABOLIC PANEL
Anion gap: 11 (ref 5–15)
Anion gap: 7 (ref 5–15)
BUN: 30 mg/dL — ABNORMAL HIGH (ref 8–23)
BUN: 30 mg/dL — ABNORMAL HIGH (ref 8–23)
CO2: 22 mmol/L (ref 22–32)
CO2: 22 mmol/L (ref 22–32)
Calcium: 8.4 mg/dL — ABNORMAL LOW (ref 8.9–10.3)
Calcium: 8.2 mg/dL — ABNORMAL LOW (ref 8.9–10.3)
Chloride: 107 mmol/L (ref 98–111)
Chloride: 109 mmol/L (ref 98–111)
Creatinine, Ser: 0.5 mg/dL (ref 0.44–1.00)
Creatinine, Ser: 0.54 mg/dL (ref 0.44–1.00)
GFR, Estimated: >60 mL/min (ref >60)
GFR, Estimated: >60 mL/min (ref >60)
Glucose, Bld: 200 mg/dL — ABNORMAL HIGH (ref 70–99)
Glucose, Bld: 189 mg/dL — ABNORMAL HIGH (ref 70–99)
Potassium: 3.3 mmol/L — ABNORMAL LOW (ref 3.5–5.1)
Potassium: 4.1 mmol/L (ref 3.5–5.1)
Sodium: 140 mmol/L (ref 135–145)
Sodium: 138 mmol/L (ref 135–145)

## 2023-09-05 LAB — PHOSPHORUS: Phosphorus: 2.2 mg/dL — ABNORMAL LOW (ref 2.5–4.6)

## 2023-09-05 LAB — HEMOGLOBIN A1C
Hgb A1c MFr Bld: 6.1 % — ABNORMAL HIGH (ref 4.8–5.6)
Mean Plasma Glucose: 128 mg/dL

## 2023-09-05 LAB — CBC WITH DIFFERENTIAL/PLATELET
Abs Immature Granulocytes: 0.17 10*3/uL — ABNORMAL HIGH (ref 0.00–0.07)
Basophils Absolute: 0 10*3/uL (ref 0.0–0.1)
Basophils Relative: 0 %
Eosinophils Absolute: 0 10*3/uL (ref 0.0–0.5)
Eosinophils Relative: 0 %
HCT: 36.5 % (ref 36.0–46.0)
Hemoglobin: 11.9 g/dL — ABNORMAL LOW (ref 12.0–15.0)
Immature Granulocytes: 1 %
Lymphocytes Relative: 8 %
Lymphs Abs: 1.2 10*3/uL (ref 0.7–4.0)
MCH: 31.4 pg (ref 26.0–34.0)
MCHC: 32.6 g/dL (ref 30.0–36.0)
MCV: 96.3 fL (ref 80.0–100.0)
Monocytes Absolute: 1.2 10*3/uL — ABNORMAL HIGH (ref 0.1–1.0)
Monocytes Relative: 8 %
Neutro Abs: 12 10*3/uL — ABNORMAL HIGH (ref 1.7–7.7)
Neutrophils Relative %: 83 %
Platelets: 199 10*3/uL (ref 150–400)
RBC: 3.79 MIL/uL — ABNORMAL LOW (ref 3.87–5.11)
RDW: 14.1 % (ref 11.5–15.5)
WBC: 14.5 10*3/uL — ABNORMAL HIGH (ref 4.0–10.5)
nRBC: 0 % (ref 0.0–0.2)

## 2023-09-05 LAB — GLUCOSE, CAPILLARY
Glucose-Capillary: 176 mg/dL — ABNORMAL HIGH (ref 70–99)
Glucose-Capillary: 193 mg/dL — ABNORMAL HIGH (ref 70–99)
Glucose-Capillary: 228 mg/dL — ABNORMAL HIGH (ref 70–99)
Glucose-Capillary: 291 mg/dL — ABNORMAL HIGH (ref 70–99)

## 2023-09-05 LAB — MAGNESIUM: Magnesium: 2.5 mg/dL — ABNORMAL HIGH (ref 1.7–2.4)

## 2023-09-05 MED ORDER — K PHOS MONO-SOD PHOS DI & MONO 155-852-130 MG PO TABS
500.0000 mg | ORAL_TABLET | Freq: Two times a day (BID) | ORAL | Status: AC
  Administered 2023-09-05 (×2): 500 mg via ORAL
  Filled 2023-09-05 (×2): qty 2

## 2023-09-05 MED ORDER — POTASSIUM CHLORIDE 20 MEQ PO PACK
40.0000 meq | PACK | Freq: Once | ORAL | Status: AC
  Administered 2023-09-05: 40 meq via ORAL
  Filled 2023-09-05: qty 2

## 2023-09-05 NOTE — Plan of Care (Signed)

## 2023-09-05 NOTE — TOC CM/SW Note (Signed)
Patient is on isolation. Attempted call into room for high readmission risk assessment. No answer. Will reattempt later.  Alfonso Ramus, LCSW Transitions of Care Department [phone]

## 2023-09-05 NOTE — Progress Notes (Addendum)
Progress Note    Rhonda Thornton  QIO:[id number] DOB: 1951/09/18  DOA: 09/03/2023 PCP: Rhonda Aldo, MD      Brief Narrative:    Medical records reviewed and are as summarized below:  Rhonda Thornton is a 72 y.o. female with medical history significant for type 2 diabetes mellitus, hypertension, chronic HFpEF, nonrheumatic aortic valve stenosis, atrial fibrillation on Eliquis, seizure disorder, history of breast cancer, primary open-angle glaucoma, obstructive sleep apnea, chronic hypoxic respiratory failure on home oxygen, recent traumatic subarachnoid hemorrhage secondary to accidental fall in July 2024.  She presented to the hospital because of facial and upper lip swelling of about 5 days duration.  She initially went to [facility] and was subsequently transferred to the emergency department for further management. She was febrile in the emergency department with temperature of 101.3 and tachypneic with respiratory rate up to 26. Wbc was slightly elevated at 12.2.   She was admitted to the hospital for sepsis secondary to facial cellulitis from right nares.  She was treated with empiric IV antibiotics and IV fluids.    Assessment/Plan:   Principal Problem:   Sepsis (HCC) Active Problems:   Angioedema of lips   Facial cellulitis   Paroxysmal atrial fibrillation (HCC)   Infection of lip   Embedded tick of ear, right, initial encounter   T2DM (type 2 diabetes mellitus) (HCC)   Chronic respiratory failure with hypoxia (HCC)   Chronic heart failure with preserved ejection fraction (HCC)   Essential (primary) hypertension   Primary open angle glaucoma (POAG) of left eye, mild stage   Nasal lesion   History of seizure disorder    Body mass index is 37.45 kg/m.  (Obesity)   Sepsis secondary to facial cellulitis/infection of upper lip: Continue IV vancomycin and cefepime.  Gentamicin ointment to bilateral nares.  MRSA screen was positive.  Appreciate input from Dr.  Andee Thornton, otolaryngologist.  Outpatient follow-up with [facility] in 1 week.   Paroxysmal atrial fibrillation: Continue amiodarone and Eliquis.   Hypokalemia and hypophosphatemia: Replete potassium and phosphorus and monitor levels   Seizure disorder: Continue Keppra   Chronic diastolic CHF: Compensated.   History of breast cancer: On letrozole   Tick was removed from the right ear in the ED on day of admission.    Other comorbidities include chronic hypoxic respiratory failure on 2 L/min oxygen at baseline, primary open-angle glaucoma,     Diet Order             DIET SOFT Room service appropriate? Yes; Fluid consistency: Thin  Diet effective now                            Consultants: Otolaryngologist  Procedures: None    Medications:    amiodarone  200 mg Oral Daily   apixaban  5 mg Oral BID   atorvastatin  80 mg Oral Daily   chlorhexidine  15 mL Mouth/Throat BID   Chlorhexidine Gluconate Cloth  6 each Topical Daily   diltiazem  180 mg Oral Daily   diphenhydrAMINE  25 mg Intravenous Q12H   dorzolamide-timolol  1 drop Both Eyes BID   DULoxetine  60 mg Oral Daily   feeding supplement  237 mL Oral QID   gentamicin ointment   Topical TID   insulin aspart  0-15 Units Subcutaneous TID WC   latanoprost  1 drop Both Eyes QHS   letrozole  2.5 mg Oral  Daily   levETIRAcetam  500 mg Oral BID   mirabegron ER  50 mg Oral Daily   multivitamin with minerals  1 tablet Oral Daily   mupirocin ointment  1 Application Nasal BID   mouth rinse  15 mL Mouth Rinse 4 times per day   pantoprazole  40 mg Oral Daily   phosphorus  500 mg Oral BID   pregabalin  100 mg Oral TID   Continuous Infusions:  ceFEPime (MAXIPIME) IV 2 g (09/05/23 0509)   famotidine (PEPCID) IV 20 mg (09/05/23 0865)   vancomycin 1,250 mg (09/04/23 2256)     Anti-infectives (From admission, onward)    Start     Dose/Rate Route Frequency Ordered Stop   09/04/23 2100  vancomycin  (VANCOREADY) IVPB 1250 mg/250 mL        1,250 mg 166.7 mL/hr over 90 Minutes Intravenous Every 24 hours 09/03/23 2237     09/03/23 2215  ceFEPIme (MAXIPIME) 2 g in sodium chloride 0.9 % 100 mL IVPB        2 g 200 mL/hr over 30 Minutes Intravenous Every 8 hours 09/03/23 2208     09/03/23 2115  vancomycin (VANCOCIN) IVPB 1000 mg/200 mL premix       Placed in "Followed by" Linked Group   1,000 mg 200 mL/hr over 60 Minutes Intravenous  Once 09/03/23 2107 09/03/23 2223   09/03/23 2115  vancomycin (VANCOCIN) IVPB 1000 mg/200 mL premix       Placed in "Followed by" Linked Group   1,000 mg 200 mL/hr over 60 Minutes Intravenous  Once 09/03/23 2107 09/03/23 2222   09/03/23 1645  Ampicillin-Sulbactam (UNASYN) 3 g in sodium chloride 0.9 % 100 mL IVPB        3 g 200 mL/hr over 30 Minutes Intravenous  Once 09/03/23 1636 09/03/23 1824              Family Communication/Anticipated D/C date and plan/Code Status   DVT prophylaxis:  apixaban (ELIQUIS) tablet 5 mg     Code Status: Full Code  Family Communication: None Disposition Plan: Plan to discharge home in 2 to 3 days   Status is: Inpatient Remains inpatient appropriate because: Sepsis from facial cellulitis       Subjective:   No acute events overnight.  She complains of pain on the upper lip.  Swallowing is better.  No shortness of breath, wheezing, cough or chest pain.  Objective:    Vitals:   09/04/23 1333 09/04/23 1916 09/05/23 0511 09/05/23 0943  BP: (!) 108/45 131/69 125/64 116/63  Pulse: 68 78 66 70  Resp: 19 20 18 20   Temp: 97.8 F (36.6 C) 99.1 F (37.3 C) 98.3 F (36.8 C) 98.5 F (36.9 C)  TempSrc: Oral Oral Oral Oral  SpO2: 95% 97% 97% 96%  Weight:      Height:       No data found.   Intake/Output Summary (Last 24 hours) at 09/05/2023 1411 Last data filed at 09/05/2023 1410 Gross per 24 hour  Intake 1341.12 ml  Output 1200 ml  Net 141.12 ml   Filed Weights   09/03/23 1616 09/03/23 2300   Weight: 96.2 kg 95.9 kg    Exam:  GEN: NAD SKIN: Warm and dry EYES: No pallor or icterus ENT: Swelling and erythema of upper lip.  No epistaxis CV: RRR PULM: CTA B ABD: soft, obese, NT, +BS CNS: AAO x 3, non focal EXT: No edema or tenderness    Data Reviewed:  I have personally reviewed following labs and imaging studies:  Labs: Labs show the following:   Basic Metabolic Panel: Recent Labs  Lab 09/03/23 1732 09/04/23 0519 09/05/23 0341  NA 136 137 140  K 4.0 3.6 3.3*  CL 98 104 107  CO2 26 22 22   GLUCOSE 126* 157* 200*  BUN 15 16 30*  CREATININE 0.56 0.48 0.50  CALCIUM 8.9 8.8* 8.4*  MG  --   --  2.5*  PHOS  --   --  2.2*   GFR Estimated Creatinine Clearance: 69 mL/min (by C-G formula based on SCr of 0.5 mg/dL). Liver Function Tests: Recent Labs  Lab 09/03/23 1732 09/04/23 0519  AST 26 41  ALT 34 48*  ALKPHOS 75 67  BILITOT 0.9 1.1  PROT 7.7 7.4  ALBUMIN 4.0 3.8   No results for input(s): "LIPASE", "AMYLASE" in the last 168 hours. No results for input(s): "AMMONIA" in the last 168 hours. Coagulation profile No results for input(s): "INR", "PROTIME" in the last 168 hours.  CBC: Recent Labs  Lab 09/03/23 1732 09/04/23 0519 09/05/23 0341  WBC 12.2* 13.2* 14.5*  NEUTROABS 9.7*  --  12.0*  HGB 13.3 12.9 11.9*  HCT 41.1 39.2 36.5  MCV 97.2 95.4 96.3  PLT 168 144* 199   Cardiac Enzymes: No results for input(s): "CKTOTAL", "CKMB", "CKMBINDEX", "TROPONINI" in the last 168 hours. BNP (last 3 results) No results for input(s): "PROBNP" in the last 8760 hours. CBG: Recent Labs  Lab 09/04/23 1122 09/04/23 1626 09/04/23 2219 09/05/23 0716 09/05/23 1134  GLUCAP 237* 231* 247* 176* 193*   D-Dimer: No results for input(s): "DDIMER" in the last 72 hours. Hgb A1c: Recent Labs    09/04/23 0023  HGBA1C 6.1*   Lipid Profile: No results for input(s): "CHOL", "HDL", "LDLCALC", "TRIG", "CHOLHDL", "LDLDIRECT" in the last 72 hours. Thyroid  function studies: No results for input(s): "TSH", "T4TOTAL", "T3FREE", "THYROIDAB" in the last 72 hours.  Invalid input(s): "FREET3" Anemia work up: No results for input(s): "VITAMINB12", "FOLATE", "FERRITIN", "TIBC", "IRON", "RETICCTPCT" in the last 72 hours. Sepsis Labs: Recent Labs  Lab 09/03/23 1732 09/03/23 1917 09/04/23 0022 09/04/23 0519 09/05/23 0341  WBC 12.2*  --   --  13.2* 14.5*  LATICACIDVEN  --  0.9 1.1  --   --     Microbiology Recent Results (from the past 240 hour(s))  MRSA Next Gen by PCR, Nasal     Status: Abnormal   Collection Time: 09/03/23 11:39 PM   Specimen: Nasal Mucosa; Nasal Swab  Result Value Ref Range Status   MRSA by PCR Next Gen DETECTED (A) NOT DETECTED Final    Comment: RESULT CALLED TO, READ BACK BY AND VERIFIED WITH: FRANCHESICA BELL @0210  ON 09/04/23 SKL (NOTE) The GeneXpert MRSA Assay (FDA approved for NASAL specimens only), is one component of a comprehensive MRSA colonization surveillance program. It is not intended to diagnose MRSA infection nor to guide or monitor treatment for MRSA infections. Test performance is not FDA approved in patients less than 10 years old. Performed at [facility], [address] [place], [place] [address]   Culture, blood (routine x 2)     Status: None (Preliminary result)   Collection Time: 09/04/23 12:23 AM   Specimen: BLOOD  Result Value Ref Range Status   Specimen Description BLOOD BLOOD RIGHT HAND  Final   Special Requests   Final    BOTTLES DRAWN AEROBIC AND ANAEROBIC Blood Culture adequate volume   Culture  Final    NO GROWTH 1 DAY Performed at [facility], [address] [place], [place] [address]    Report Status PENDING  Incomplete  Culture, blood (routine x 2)     Status: None (Preliminary result)   Collection Time: 09/04/23 12:23 AM   Specimen: BLOOD  Result Value Ref Range Status   Specimen Description BLOOD BLOOD RIGHT ARM  Final   Special Requests   Final     BOTTLES DRAWN AEROBIC AND ANAEROBIC Blood Culture adequate volume   Culture   Final    NO GROWTH 1 DAY Performed at [facility], [address], [place], [place] [address]    Report Status PENDING  Incomplete    Procedures and diagnostic studies:  CT Maxillofacial W Contrast  Result Date: 09/03/2023 CLINICAL DATA:  Facial swelling EXAM: CT MAXILLOFACIAL WITH CONTRAST TECHNIQUE: Multidetector CT imaging of the maxillofacial structures was performed with intravenous contrast. Multiplanar CT image reconstructions were also generated. RADIATION DOSE REDUCTION: This exam was performed according to the departmental dose-optimization program which includes automated exposure control, adjustment of the mA and/or kV according to patient size and/or use of iterative reconstruction technique. CONTRAST:  75mL OMNIPAQUE IOHEXOL 300 MG/ML  SOLN COMPARISON:  06/24/2023 FINDINGS: Osseous: Poor dentition with multiple apical lucencies of maxilla, greatest at the root the most posterior remaining tooth on the left. Orbits: Negative. No traumatic or inflammatory finding. Sinuses: Polypoid soft tissue within the left nasal cavity and upper ethmoid sinus, unchanged since 06/24/2023 Soft tissues: Marked edema of the upper lip with inflammatory change in the subcutaneous fat of the lower face bilaterally. No abscess or drainable fluid collection. Limited intracranial: No significant or unexpected finding. IMPRESSION: 1. Marked edema of the upper lip with inflammatory change in the subcutaneous fat of the lower face bilaterally. No abscess or drainable fluid collection. 2. Poor dentition with multiple apical lucencies of maxilla, greatest at the root the most posterior remaining tooth on the left. 3. Polypoid soft tissue within the left nasal cavity and upper ethmoid sinus, unchanged since 06/24/2023. Electronically Signed   By: Deatra Robinson M.D.   On: 09/03/2023 20:27               LOS: 2 days    Richerd Grime  [facility]   Pager on [email or web address]. If 7PM-7AM, please contact night-coverage at [email or web address]     09/05/2023, 2:11 PM

## 2023-09-06 DIAGNOSIS — L03211 Cellulitis of face: Secondary | ICD-10-CM | POA: Diagnosis not present

## 2023-09-06 DIAGNOSIS — A419 Sepsis, unspecified organism: Secondary | ICD-10-CM | POA: Diagnosis not present

## 2023-09-06 LAB — CREATININE, SERUM
Creatinine, Ser: 0.5 mg/dL (ref 0.44–1.00)
GFR, Estimated: >60 mL/min (ref >60)

## 2023-09-06 LAB — CBC WITH DIFFERENTIAL/PLATELET
Abs Immature Granulocytes: 0.05 10*3/uL (ref 0.00–0.07)
Basophils Absolute: 0 10*3/uL (ref 0.0–0.1)
Basophils Relative: 0 %
Eosinophils Absolute: 0 10*3/uL (ref 0.0–0.5)
Eosinophils Relative: 1 %
HCT: 36.4 % (ref 36.0–46.0)
Hemoglobin: 12.1 g/dL (ref 12.0–15.0)
Immature Granulocytes: 2 %
Lymphocytes Relative: 25 %
Lymphs Abs: 0.6 10*3/uL — ABNORMAL LOW (ref 0.7–4.0)
MCH: 31.8 pg (ref 26.0–34.0)
MCHC: 33.2 g/dL (ref 30.0–36.0)
MCV: 95.8 fL (ref 80.0–100.0)
Monocytes Absolute: 0.3 10*3/uL (ref 0.1–1.0)
Monocytes Relative: 12 %
Neutro Abs: 1.5 10*3/uL — ABNORMAL LOW (ref 1.7–7.7)
Neutrophils Relative %: 60 %
Platelets: 164 10*3/uL (ref 150–400)
RBC: 3.8 MIL/uL — ABNORMAL LOW (ref 3.87–5.11)
RDW: 14.3 % (ref 11.5–15.5)
WBC: 2.5 10*3/uL — ABNORMAL LOW (ref 4.0–10.5)
nRBC: 0 % (ref 0.0–0.2)

## 2023-09-06 LAB — GLUCOSE, CAPILLARY
Glucose-Capillary: 145 mg/dL — ABNORMAL HIGH (ref 70–99)
Glucose-Capillary: 186 mg/dL — ABNORMAL HIGH (ref 70–99)
Glucose-Capillary: 164 mg/dL — ABNORMAL HIGH (ref 70–99)
Glucose-Capillary: 201 mg/dL — ABNORMAL HIGH (ref 70–99)

## 2023-09-06 LAB — MAGNESIUM: Magnesium: 2.3 mg/dL (ref 1.7–2.4)

## 2023-09-06 LAB — PHOSPHORUS: Phosphorus: 2.1 mg/dL — ABNORMAL LOW (ref 2.5–4.6)

## 2023-09-06 MED ORDER — K PHOS MONO-SOD PHOS DI & MONO 155-852-130 MG PO TABS
500.0000 mg | ORAL_TABLET | Freq: Three times a day (TID) | ORAL | Status: AC
  Administered 2023-09-06 (×2): 500 mg via ORAL
  Filled 2023-09-06 (×2): qty 2

## 2023-09-06 NOTE — TOC Initial Note (Signed)
Transition of Care [facility]) - Initial/Assessment Note    Patient Details  Name: Rhonda Thornton MRN: [id number] Date of Birth: 1950/12/25  Transition of Care [facility]) CM/SW Contact:    Darolyn Rua, LCSW Phone Number: 09/06/2023, 9:08 AM  Clinical Narrative:                  CSW spoke with patient to complete readmission risk assessment, patient confirms below:  PCP: Dr. Allena Katz [facility]: [facility] DME: Leisa Lenz HH: Per patient: Active with Rolene Arbour for [facility] PT OT RN, CSW has reached out to [place] with [facility] to confirm this pending response.  Transport at discharge: Brother Peyton Najjar  Please consult TOC should additional needs arise, patient will need HH orders prior to discharge.    Expected Discharge Plan: Home w Home Health Services Barriers to Discharge: Continued Medical Work up   Patient Goals and CMS Choice Patient states their goals for this hospitalization and ongoing recovery are:: to go home CMS Medicare.gov Compare Post Acute Care list provided to:: Patient Choice offered to / list presented to : Patient      Expected Discharge Plan and Services       Living arrangements for the past 2 months: Single Family Home                             HH Agency: [facility] Date [facility] Agency Contacted: 09/06/23   Representative spoke with at [facility] Agency: Bjorn Loser  Prior Living Arrangements/Services Living arrangements for the past 2 months: Single Family Home Lives with:: Self   Do you feel safe going back to the place where you live?: Yes               Activities of Daily Living Home Assistive Devices/Equipment: Oxygen, Walker (specify type) (Roll-ader) ADL Screening (condition at time of admission) Patient's cognitive ability adequate to safely complete daily activities?: Yes Is the patient deaf or have difficulty hearing?: No Does the patient have difficulty seeing, even when wearing glasses/contacts?: No Does the patient have difficulty concentrating,  remembering, or making decisions?: No Patient able to express need for assistance with ADLs?: Yes Does the patient have difficulty dressing or bathing?: No Independently performs ADLs?: No Communication: Independent Dressing (OT): Needs assistance Is this a change from baseline?: Change from baseline, expected to last <3days Grooming: Needs assistance Is this a change from baseline?: Change from baseline, expected to last <3 days Feeding: Independent Is this a change from baseline?: Pre-admission baseline Bathing: Needs assistance Is this a change from baseline?: Change from baseline, expected to last <3 days Toileting: Needs assistance Is this a change from baseline?: Change from baseline, expected to last <3 days In/Out Bed: Needs assistance Is this a change from baseline?: Pre-admission baseline Walks in Home: Needs assistance Is this a change from baseline?: Pre-admission baseline Does the patient have difficulty walking or climbing stairs?: Yes Weakness of Legs: Both Weakness of Arms/Hands: Both  Permission Sought/Granted                  Emotional Assessment   Attitude/Demeanor/Rapport: Gracious Affect (typically observed): Calm Orientation: : Oriented to Self, Oriented to  Time, Oriented to Situation, Oriented to Place Alcohol / Substance Use: Not Applicable Psych Involvement: No (comment)  Admission diagnosis:  Infection of lip [K13.0] Facial cellulitis [L03.211] Patient Active Problem List   Diagnosis Date Noted   Facial cellulitis 09/04/2023   History of seizure disorder  Infection of lip 09/03/2023   Angioedema of lips 09/03/2023   Embedded tick of ear, right, initial encounter 09/03/2023   Nasal lesion 09/03/2023   Primary open angle glaucoma (POAG) of left eye, mild stage 09/02/2023   Traumatic subarachnoid hemorrhage secondary to accidental fall (HCC) 06/24/2023   Chronic anticoagulation 06/24/2023   Facial contusion, initial encounter 06/24/2023    Accidental fall 06/24/2023   Paroxysmal atrial fibrillation (HCC) 06/24/2023   T2DM (type 2 diabetes mellitus) (HCC) 06/24/2023   Chronic heart failure with preserved ejection fraction (HCC) 07/13/2022   Depression 07/13/2022   Nonrheumatic aortic valve stenosis 05/15/2022   Anemia 03/13/2022   Chronic respiratory failure with hypoxia (HCC) 02/19/2022   Sepsis (HCC) 02/08/2022   Cellulitis of left breast 02/08/2022   Atrial fibrillation with RVR (HCC) 02/08/2022   Secondary seroma of breast 12/12/2021   Morbid obesity (HCC) 12/12/2021   Pain in joint of right shoulder 11/19/2021   Pain of left hip joint 11/19/2021   Pain in joint of left knee 07/03/2021   Arthritis of right knee 07/03/2021   Carcinoma of upper-outer quadrant of female breast, left (HCC) 05/02/2021   Gastroparesis due to DM (HCC) 10/22/2017   Cardiac murmur 06/09/2016   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 [place] [address] Phone: [phone] Fax: [phone]  [id number] [place], [place] - [address] AT [facility] OF [address] [place] [address] Phone: [phone] Fax: [phone]     Social Determinants of Health (SDOH) Social History: SDOH Screenings   Food Insecurity: No Food Insecurity (09/03/2023)  Housing: Low Risk  (09/03/2023)  Transportation Needs: No Transportation Needs (09/03/2023)  Utilities: Not At Risk (09/03/2023)  Financial Resource Strain: Medium Risk (07/14/2022)   Received from [facility]  Tobacco Use: Low Risk  (09/03/2023)   SDOH Interventions:     Readmission Risk  Interventions    02/15/2022    4:13 PM  Readmission Risk Prevention Plan  Transportation Screening Complete  PCP or Specialist Appt within 5-7 Days Complete  Home Care Screening Complete  Medication Review (RN CM) Complete

## 2023-09-06 NOTE — Progress Notes (Addendum)
Progress Note    Rhonda Thornton  WJX:[id number] DOB: 07-Dec-1951  DOA: 09/03/2023 PCP: Hillery Aldo, MD      Brief Narrative:    Medical records reviewed and are as summarized below:  Rhonda Thornton is a 72 y.o. female with medical history significant for type 2 diabetes mellitus, hypertension, chronic HFpEF, nonrheumatic aortic valve stenosis, atrial fibrillation on Eliquis, seizure disorder, history of breast cancer, primary open-angle glaucoma, obstructive sleep apnea, chronic hypoxic respiratory failure on home oxygen, recent traumatic subarachnoid hemorrhage secondary to accidental fall in July 2024.  She presented to the hospital because of facial and upper lip swelling of about 5 days duration.  She initially went to [place] [facility] and was subsequently transferred to the emergency department for further management. She was febrile in the emergency department with temperature of 101.3 and tachypneic with respiratory rate up to 26. Wbc was slightly elevated at 12.2.   She was admitted to the hospital for sepsis secondary to facial cellulitis from right nares.  She was treated with empiric IV antibiotics and IV fluids.    Assessment/Plan:   Principal Problem:   Sepsis (HCC) Active Problems:   Angioedema of lips   Facial cellulitis   Paroxysmal atrial fibrillation (HCC)   Infection of lip   Embedded tick of ear, right, initial encounter   T2DM (type 2 diabetes mellitus) (HCC)   Chronic respiratory failure with hypoxia (HCC)   Chronic heart failure with preserved ejection fraction (HCC)   Essential (primary) hypertension   Primary open angle glaucoma (POAG) of left eye, mild stage   Nasal lesion   History of seizure disorder    Body mass index is 37.45 kg/m.  (Obesity)   Sepsis secondary to facial cellulitis/infection of upper lip: She still has nausea, upper lip pain and slurred speech.  Continue IV cefepime and vancomycin for now.  Gentamicin ointment to  bilateral nares.  MRSA screen was positive.  Outpatient follow-up with [facility] in 1 week.   She had leukocytosis on admission which is improved but she is now leukopenic.  Repeat CBC tomorrow.   Paroxysmal atrial fibrillation: Continue amiodarone and Eliquis.   Hypokalemia: Improved Hypophosphatemia: Continue phosphorus repletion with oral potassium phosphate.     Seizure disorder: Continue Keppra   Chronic diastolic CHF: Compensated.   History of breast cancer: On letrozole   Tick was removed from the right ear in the ED on day of admission.    Other comorbidities include chronic hypoxic respiratory failure on 2 L/min oxygen at baseline, primary open-angle glaucoma,     Diet Order             DIET SOFT Room service appropriate? Yes; Fluid consistency: Thin  Diet effective now                            Consultants: Otolaryngologist  Procedures: None    Medications:    amiodarone  200 mg Oral Daily   apixaban  5 mg Oral BID   atorvastatin  80 mg Oral Daily   chlorhexidine  15 mL Mouth/Throat BID   Chlorhexidine Gluconate Cloth  6 each Topical Daily   diltiazem  180 mg Oral Daily   diphenhydrAMINE  25 mg Intravenous Q12H   dorzolamide-timolol  1 drop Both Eyes BID   DULoxetine  60 mg Oral Daily   feeding supplement  237 mL Oral QID   gentamicin ointment   Topical  TID   insulin aspart  0-15 Units Subcutaneous TID WC   latanoprost  1 drop Both Eyes QHS   letrozole  2.5 mg Oral Daily   levETIRAcetam  500 mg Oral BID   mirabegron ER  50 mg Oral Daily   multivitamin with minerals  1 tablet Oral Daily   mupirocin ointment  1 Application Nasal BID   mouth rinse  15 mL Mouth Rinse 4 times per day   pantoprazole  40 mg Oral Daily   pregabalin  100 mg Oral TID   Continuous Infusions:  ceFEPime (MAXIPIME) IV 2 g (09/06/23 0523)   famotidine (PEPCID) IV 20 mg (09/06/23 0859)   vancomycin Stopped (09/05/23 2145)     Anti-infectives  (From admission, onward)    Start     Dose/Rate Route Frequency Ordered Stop   09/04/23 2100  vancomycin (VANCOREADY) IVPB 1250 mg/250 mL        1,250 mg 166.7 mL/hr over 90 Minutes Intravenous Every 24 hours 09/03/23 2237     09/03/23 2215  ceFEPIme (MAXIPIME) 2 g in sodium chloride 0.9 % 100 mL IVPB        2 g 200 mL/hr over 30 Minutes Intravenous Every 8 hours 09/03/23 2208     09/03/23 2115  vancomycin (VANCOCIN) IVPB 1000 mg/200 mL premix       Placed in "Followed by" Linked Group   1,000 mg 200 mL/hr over 60 Minutes Intravenous  Once 09/03/23 2107 09/03/23 2223   09/03/23 2115  vancomycin (VANCOCIN) IVPB 1000 mg/200 mL premix       Placed in "Followed by" Linked Group   1,000 mg 200 mL/hr over 60 Minutes Intravenous  Once 09/03/23 2107 09/03/23 2222   09/03/23 1645  Ampicillin-Sulbactam (UNASYN) 3 g in sodium chloride 0.9 % 100 mL IVPB        3 g 200 mL/hr over 30 Minutes Intravenous  Once 09/03/23 1636 09/03/23 1824              Family Communication/Anticipated D/C date and plan/Code Status   DVT prophylaxis:  apixaban (ELIQUIS) tablet 5 mg     Code Status: Full Code  Family Communication: None Disposition Plan: Plan to discharge home tomorrow   Status is: Inpatient Remains inpatient appropriate because: Sepsis from facial cellulitis       Subjective:   Interval events noted.  She complains of nausea.  She still has pain in the upper lip.  No epistaxis but speech is still slurred from swelling left.  Objective:    Vitals:   09/05/23 1557 09/05/23 1930 09/06/23 0255 09/06/23 0759  BP: 124/66 121/81 123/63 127/62  Pulse: 74 77 83 73  Resp: 18 18 20 18   Temp: 98.4 F (36.9 C) 98.9 F (37.2 C) 99.6 F (37.6 C) 98.9 F (37.2 C)  TempSrc: Oral  Oral   SpO2: 94% 96% 92% 96%  Weight:      Height:       No data found.   Intake/Output Summary (Last 24 hours) at 09/06/2023 1558 Last data filed at 09/06/2023 1545 Gross per 24 hour  Intake 1680 ml   Output --  Net 1680 ml   Filed Weights   09/03/23 1616 09/03/23 2300  Weight: 96.2 kg 95.9 kg    Exam:  GEN: NAD SKIN: Warm and dry EYES: No pallor or icterus ENT: MMM, swelling and erythema of the upper lip.  No nosebleed CV: RRR PULM: CTA B ABD: soft, ND, NT, +BS CNS: AAO  x 3, non focal EXT: No edema or tenderness     Data Reviewed:   I have personally reviewed following labs and imaging studies:  Labs: Labs show the following:   Basic Metabolic Panel: Recent Labs  Lab 09/03/23 1732 09/04/23 0519 09/05/23 0341 09/05/23 1432 09/06/23 0347  NA 136 137 140 138  --   K 4.0 3.6 3.3* 4.1  --   CL 98 104 107 109  --   CO2 26 22 22 22   --   GLUCOSE 126* 157* 200* 189*  --   BUN 15 16 30* 30*  --   CREATININE 0.56 0.48 0.50 0.54 0.50  CALCIUM 8.9 8.8* 8.4* 8.2*  --   MG  --   --  2.5*  --  2.3  PHOS  --   --  2.2*  --  2.1*   GFR Estimated Creatinine Clearance: 69 mL/min (by C-G formula based on SCr of 0.5 mg/dL). Liver Function Tests: Recent Labs  Lab 09/03/23 1732 09/04/23 0519  AST 26 41  ALT 34 48*  ALKPHOS 75 67  BILITOT 0.9 1.1  PROT 7.7 7.4  ALBUMIN 4.0 3.8   No results for input(s): "LIPASE", "AMYLASE" in the last 168 hours. No results for input(s): "AMMONIA" in the last 168 hours. Coagulation profile No results for input(s): "INR", "PROTIME" in the last 168 hours.  CBC: Recent Labs  Lab 09/03/23 1732 09/04/23 0519 09/05/23 0341 09/06/23 0347  WBC 12.2* 13.2* 14.5* 2.5*  NEUTROABS 9.7*  --  12.0* 1.5*  HGB 13.3 12.9 11.9* 12.1  HCT 41.1 39.2 36.5 36.4  MCV 97.2 95.4 96.3 95.8  PLT 168 144* 199 164   Cardiac Enzymes: No results for input(s): "CKTOTAL", "CKMB", "CKMBINDEX", "TROPONINI" in the last 168 hours. BNP (last 3 results) No results for input(s): "PROBNP" in the last 8760 hours. CBG: Recent Labs  Lab 09/05/23 1134 09/05/23 1640 09/05/23 2138 09/06/23 0758 09/06/23 1149  GLUCAP 193* 228* 291* 145* 186*    D-Dimer: No results for input(s): "DDIMER" in the last 72 hours. Hgb A1c: Recent Labs    09/04/23 0023  HGBA1C 6.1*   Lipid Profile: No results for input(s): "CHOL", "HDL", "LDLCALC", "TRIG", "CHOLHDL", "LDLDIRECT" in the last 72 hours. Thyroid function studies: No results for input(s): "TSH", "T4TOTAL", "T3FREE", "THYROIDAB" in the last 72 hours.  Invalid input(s): "FREET3" Anemia work up: No results for input(s): "VITAMINB12", "FOLATE", "FERRITIN", "TIBC", "IRON", "RETICCTPCT" in the last 72 hours. Sepsis Labs: Recent Labs  Lab 09/03/23 1732 09/03/23 1917 09/04/23 0022 09/04/23 0519 09/05/23 0341 09/06/23 0347  WBC 12.2*  --   --  13.2* 14.5* 2.5*  LATICACIDVEN  --  0.9 1.1  --   --   --     Microbiology Recent Results (from the past 240 hour(s))  MRSA Next Gen by PCR, Nasal     Status: Abnormal   Collection Time: 09/03/23 11:39 PM   Specimen: Nasal Mucosa; Nasal Swab  Result Value Ref Range Status   MRSA by PCR Next Gen DETECTED (A) NOT DETECTED Final    Comment: RESULT CALLED TO, READ BACK BY AND VERIFIED WITH: FRANCHESICA BELL @0210  ON 09/04/23 SKL (NOTE) The GeneXpert MRSA Assay (FDA approved for NASAL specimens only), is one component of a comprehensive MRSA colonization surveillance program. It is not intended to diagnose MRSA infection nor to guide or monitor treatment for MRSA infections. Test performance is not FDA approved in patients less than 49 years old. Performed at [facility],  [address], [place], [place] [address]   Culture, blood (routine x 2)     Status: None (Preliminary result)   Collection Time: 09/04/23 12:23 AM   Specimen: BLOOD  Result Value Ref Range Status   Specimen Description BLOOD BLOOD RIGHT HAND  Final   Special Requests   Final    BOTTLES DRAWN AEROBIC AND ANAEROBIC Blood Culture adequate volume   Culture   Final    NO GROWTH 2 DAYS Performed at [facility], [address] [place], [place]  [address]    Report Status PENDING  Incomplete  Culture, blood (routine x 2)     Status: None (Preliminary result)   Collection Time: 09/04/23 12:23 AM   Specimen: BLOOD  Result Value Ref Range Status   Specimen Description BLOOD BLOOD RIGHT ARM  Final   Special Requests   Final    BOTTLES DRAWN AEROBIC AND ANAEROBIC Blood Culture adequate volume   Culture   Final    NO GROWTH 2 DAYS Performed at [facility], [address] [place], [place] [address]    Report Status PENDING  Incomplete    Procedures and diagnostic studies:  No results found.             LOS: 3 days   Bj Morlock  [facility]   Pager on [email or web address]. If 7PM-7AM, please contact night-coverage at [email or web address]     09/06/2023, 3:58 PM

## 2023-09-06 NOTE — Plan of Care (Signed)

## 2023-09-07 DIAGNOSIS — L03211 Cellulitis of face: Secondary | ICD-10-CM | POA: Diagnosis not present

## 2023-09-07 DIAGNOSIS — A419 Sepsis, unspecified organism: Secondary | ICD-10-CM | POA: Diagnosis not present

## 2023-09-07 LAB — CBC WITH DIFFERENTIAL/PLATELET
Abs Immature Granulocytes: 0.22 10*3/uL — ABNORMAL HIGH (ref 0.00–0.07)
Basophils Absolute: 0 10*3/uL (ref 0.0–0.1)
Basophils Relative: 1 %
Eosinophils Absolute: 0.3 10*3/uL (ref 0.0–0.5)
Eosinophils Relative: 6 %
HCT: 36.9 % (ref 36.0–46.0)
Hemoglobin: 11.7 g/dL — ABNORMAL LOW (ref 12.0–15.0)
Immature Granulocytes: 5 %
Lymphocytes Relative: 22 %
Lymphs Abs: 0.9 10*3/uL (ref 0.7–4.0)
MCH: 31 pg (ref 26.0–34.0)
MCHC: 31.7 g/dL (ref 30.0–36.0)
MCV: 97.6 fL (ref 80.0–100.0)
Monocytes Absolute: 0.3 10*3/uL (ref 0.1–1.0)
Monocytes Relative: 8 %
Neutro Abs: 2.4 10*3/uL (ref 1.7–7.7)
Neutrophils Relative %: 58 %
Platelets: 143 10*3/uL — ABNORMAL LOW (ref 150–400)
RBC: 3.78 MIL/uL — ABNORMAL LOW (ref 3.87–5.11)
RDW: 14.1 % (ref 11.5–15.5)
WBC: 4.1 10*3/uL (ref 4.0–10.5)
nRBC: 0 % (ref 0.0–0.2)

## 2023-09-07 LAB — BASIC METABOLIC PANEL
Anion gap: 9 (ref 5–15)
BUN: 23 mg/dL (ref 8–23)
CO2: 25 mmol/L (ref 22–32)
Calcium: 8.1 mg/dL — ABNORMAL LOW (ref 8.9–10.3)
Chloride: 105 mmol/L (ref 98–111)
Creatinine, Ser: 0.35 mg/dL — ABNORMAL LOW (ref 0.44–1.00)
GFR, Estimated: >60 mL/min (ref >60)
Glucose, Bld: 186 mg/dL — ABNORMAL HIGH (ref 70–99)
Potassium: 3.8 mmol/L (ref 3.5–5.1)
Sodium: 139 mmol/L (ref 135–145)

## 2023-09-07 LAB — PHOSPHORUS: Phosphorus: 3.1 mg/dL (ref 2.5–4.6)

## 2023-09-07 LAB — GLUCOSE, CAPILLARY: Glucose-Capillary: 146 mg/dL — ABNORMAL HIGH (ref 70–99)

## 2023-09-07 MED ORDER — DOXYCYCLINE HYCLATE 100 MG PO TABS: 100.0000 mg | ORAL_TABLET | Freq: Two times a day (BID) | ORAL | 0 refills | Status: AC

## 2023-09-07 MED ORDER — PANTOPRAZOLE SODIUM 40 MG PO TBEC: 40.0000 mg | DELAYED_RELEASE_TABLET | Freq: Two times a day (BID) | ORAL | Status: AC

## 2023-09-07 MED ORDER — DOXYCYCLINE HYCLATE 100 MG PO TABS: 100.0000 mg | ORAL_TABLET | Freq: Two times a day (BID) | ORAL | Status: DC

## 2023-09-07 MED ORDER — AMOXICILLIN-POT CLAVULANATE 875-125 MG PO TABS: 1.0000 | ORAL_TABLET | Freq: Two times a day (BID) | ORAL | 0 refills | Status: AC

## 2023-09-07 MED ORDER — AMOXICILLIN-POT CLAVULANATE 875-125 MG PO TABS: 1.0000 | ORAL_TABLET | Freq: Two times a day (BID) | ORAL | Status: DC

## 2023-09-07 NOTE — Care Management Important Message (Signed)
Important Message  Patient Details  Name: Rhonda Thornton MRN: [id number] Date of Birth: March 12, 1951   Medicare Important Message Given:  Yes     Johnell Comings 09/07/2023, 10:09 AM

## 2023-09-07 NOTE — Plan of Care (Signed)
  Problem: Health Behavior/Discharge Planning: Goal: Ability to identify and utilize available resources and services will improve Outcome: Progressing   Problem: Clinical Measurements: Goal: Respiratory complications will improve Outcome: Progressing   Problem: Nutrition: Goal: Adequate nutrition will be maintained Outcome: Progressing   Problem: Safety: Goal: Ability to remain free from injury will improve Outcome: Progressing

## 2023-09-07 NOTE — Progress Notes (Signed)
Discharge education completed. Patient is not dressed, stated her family is bringing her clothes. Will let us know when family arrives.  Cornell Barman Ricco Dershem

## 2023-09-07 NOTE — Progress Notes (Signed)
Rhonda Thornton to be discharged Home per MD order. Discussed prescriptions and follow up appointments with the patient. Prescriptions given to patient, medication list explained in detail. Patient verbalized understanding.  Allergies as of 09/07/2023   No Known Allergies      Medication List     STOP taking these medications    levETIRAcetam 500 MG tablet Commonly known as: KEPPRA       TAKE these medications    acetaminophen 325 MG tablet Commonly known as: TYLENOL Take 650 mg by mouth 2 (two) times daily.   alum & mag hydroxide-simeth 200-200-20 MG/5ML suspension Commonly known as: MAALOX/MYLANTA Take 30 mLs by mouth every 6 (six) hours as needed for indigestion or heartburn.   amiodarone 200 MG tablet Commonly known as: PACERONE Take 1 tablet (200 mg total) by mouth daily.   amoxicillin-clavulanate 875-125 MG tablet Commonly known as: AUGMENTIN Take 1 tablet by mouth every 12 (twelve) hours for 4 days.   apixaban 5 MG Tabs tablet Commonly known as: ELIQUIS Take 1 tablet (5 mg total) by mouth 2 (two) times daily. Hold for 1 wk What changed: additional instructions   aspirin EC 81 MG tablet Take 81 mg by mouth at bedtime.   atorvastatin 80 MG tablet Commonly known as: LIPITOR Take 80 mg by mouth daily.   calcium carbonate 500 MG chewable tablet Commonly known as: TUMS - dosed in mg elemental calcium Chew 1,000 mg by mouth 3 (three) times daily as needed for indigestion or heartburn.   cetirizine 10 MG tablet Commonly known as: ZYRTEC Take 10 mg by mouth daily.   Daily-Vite Multivitamin Tabs Take 1 tablet by mouth daily.   diltiazem 240 MG 24 hr capsule Commonly known as: CARDIZEM CD Take 1 capsule (240 mg total) by mouth daily.   dorzolamide-timolol 2-0.5 % ophthalmic solution Commonly known as: COSOPT Place 1 drop into both eyes 2 (two) times daily.   doxycycline 100 MG tablet Commonly known as: VIBRA-TABS Take 1 tablet (100 mg total) by mouth  every 12 (twelve) hours for 4 days.   DULoxetine 60 MG capsule Commonly known as: CYMBALTA Take 60 mg by mouth daily.   empagliflozin 10 MG Tabs tablet Commonly known as: JARDIANCE Take 10 mg by mouth daily.   ferrous sulfate 325 (65 FE) MG tablet Take 325 mg by mouth daily.   fluticasone 50 MCG/ACT nasal spray Commonly known as: FLONASE Place 2 sprays into both nostrils daily.   furosemide 20 MG tablet Commonly known as: LASIX Take 20 mg by mouth daily.   latanoprost 0.005 % ophthalmic solution Commonly known as: XALATAN Place 1 drop into both eyes at bedtime.   letrozole 2.5 MG tablet Commonly known as: FEMARA TAKE 1 TABLET BY MOUTH ONCE DAILY.   melatonin 3 MG Tabs tablet Take 3 mg by mouth at bedtime as needed (Sleep).   metFORMIN 500 MG tablet Commonly known as: GLUCOPHAGE Take 500 mg by mouth 2 (two) times daily.   mirabegron ER 50 MG Tb24 tablet Commonly known as: MYRBETRIQ Take 1 tablet (50 mg total) by mouth daily.   OXYGEN Inhale 2 L into the lungs at bedtime.   pantoprazole 40 MG tablet Commonly known as: Protonix Take 1 tablet (40 mg total) by mouth 2 (two) times daily.   pregabalin 100 MG capsule Commonly known as: LYRICA Take 1 capsule (100 mg total) by mouth 3 (three) times daily.   STOOL SOFTENER PO Take 100 mg by mouth at bedtime.   traMADol  50 MG tablet Commonly known as: ULTRAM Take 50 mg by mouth 2 (two) times daily as needed.        Vitals:   09/07/23 0332 09/07/23 0740  BP: (!) 102/51 (!) 106/48  Pulse: 62 63  Resp: 16 18  Temp: 98.4 F (36.9 C) 98.1 F (36.7 C)  SpO2: 92% 100%    Skin clean, dry and intact without evidence of skin break down and or skin tears. IV catheter discontinued intact. Site without signs and symptoms of complications. Dressing and pressure applied. Patient denies pain at this time. No complaints noted.  An After Visit Summary was printed and given to the patient. Patient escorted via wheelchair  and discharged Home home via private auto.  Madie Reno, RN

## 2023-09-07 NOTE — Discharge Summary (Signed)
Physician Discharge Summary   Patient: Rhonda Thornton MRN: [id number] DOB: Aug 24, 1951  Admit date:     09/03/2023  Discharge date: 09/07/23  Discharge Physician: Lurene Shadow   PCP: Hillery Aldo, MD   Recommendations at discharge:   Follow-up with Dr. Andee Poles, otolaryngologist, in 1 week Follow-up with PCP in 1 to 2 weeks  Discharge Diagnoses: Principal Problem:   Sepsis (HCC) Active Problems:   Angioedema of lips   Facial cellulitis   Paroxysmal atrial fibrillation (HCC)   Infection of lip   Embedded tick of ear, right, initial encounter   T2DM (type 2 diabetes mellitus) (HCC)   Chronic respiratory failure with hypoxia (HCC)   Chronic heart failure with preserved ejection fraction (HCC)   Essential (primary) hypertension   Primary open angle glaucoma (POAG) of left eye, mild stage   Nasal lesion   History of seizure disorder  Resolved Problems:   * No resolved hospital problems. *  Hospital Course:  Rhonda Thornton is a 72 y.o. female with medical history significant for type 2 diabetes mellitus, hypertension, chronic HFpEF, nonrheumatic aortic valve stenosis, atrial fibrillation on Eliquis, history of seizure disorder, history of breast cancer, primary open-angle glaucoma, obstructive sleep apnea, chronic hypoxic respiratory failure on home oxygen, recent traumatic subarachnoid hemorrhage secondary to accidental fall in July 2024.  She presented to the hospital because of facial and upper lip swelling of about 5 days duration.  She initially went to [facility] and was subsequently transferred to the emergency department for further management. She was febrile in the emergency department with temperature of 101.3 and tachypneic with respiratory rate up to 26. Wbc was slightly elevated at 12.2.    She was admitted to the hospital for sepsis secondary to facial cellulitis from right nares.  She was treated with empiric IV antibiotics and IV fluids  Assessment and  Plan:   Sepsis secondary to facial cellulitis/infection of upper lip: Improved.  She will be discharged on 4-day course of Augmentin and doxycycline.  Outpatient follow-up with  Dr. Andee Poles, otolaryngologist, in 1 week.   MRSA screen was positive.    Leukocytosis and leukopenia have resolved     Paroxysmal atrial fibrillation: Continue amiodarone and Eliquis.     Hypokalemia and hypophosphatemia: Improved    History of seizure disorder: Patient says she no longer takes Keppra.  She said Keppra was only prescribed prophylactically for 7 days after she developed traumatic subarachnoid hemorrhage following a fall in July 2024.  She does not want to continue with Keppra.  Chart review confirmed her report.     Chronic diastolic CHF: Compensated.     History of breast cancer: On letrozole     Tick was removed from the right ear in the ED on day of admission.     Other comorbidities include chronic hypoxic respiratory failure on 2 L/min oxygen at baseline, primary open-angle glaucoma,    Her condition is improved and she is deemed stable for discharge home today.       Consultants: Otolaryngologist Procedures performed: None Disposition: Home Diet recommendation:  Discharge Diet Orders (From admission, onward)     Start     Ordered   09/07/23 0000  Diet - low sodium heart healthy        09/07/23 1046           Cardiac diet DISCHARGE MEDICATION: Allergies as of 09/07/2023   No Known Allergies      Medication List     STOP  taking these medications    levETIRAcetam 500 MG tablet Commonly known as: KEPPRA       TAKE these medications    acetaminophen 325 MG tablet Commonly known as: TYLENOL Take 650 mg by mouth 2 (two) times daily.   alum & mag hydroxide-simeth 200-200-20 MG/5ML suspension Commonly known as: MAALOX/MYLANTA Take 30 mLs by mouth every 6 (six) hours as needed for indigestion or heartburn.   amiodarone 200 MG tablet Commonly known as:  PACERONE Take 1 tablet (200 mg total) by mouth daily.   amoxicillin-clavulanate 875-125 MG tablet Commonly known as: AUGMENTIN Take 1 tablet by mouth every 12 (twelve) hours for 4 days.   apixaban 5 MG Tabs tablet Commonly known as: ELIQUIS Take 1 tablet (5 mg total) by mouth 2 (two) times daily. Hold for 1 wk What changed: additional instructions   aspirin EC 81 MG tablet Take 81 mg by mouth at bedtime.   atorvastatin 80 MG tablet Commonly known as: LIPITOR Take 80 mg by mouth daily.   calcium carbonate 500 MG chewable tablet Commonly known as: TUMS - dosed in mg elemental calcium Chew 1,000 mg by mouth 3 (three) times daily as needed for indigestion or heartburn.   cetirizine 10 MG tablet Commonly known as: ZYRTEC Take 10 mg by mouth daily.   Daily-Vite Multivitamin Tabs Take 1 tablet by mouth daily.   diltiazem 240 MG 24 hr capsule Commonly known as: CARDIZEM CD Take 1 capsule (240 mg total) by mouth daily.   dorzolamide-timolol 2-0.5 % ophthalmic solution Commonly known as: COSOPT Place 1 drop into both eyes 2 (two) times daily.   doxycycline 100 MG tablet Commonly known as: VIBRA-TABS Take 1 tablet (100 mg total) by mouth every 12 (twelve) hours for 4 days.   DULoxetine 60 MG capsule Commonly known as: CYMBALTA Take 60 mg by mouth daily.   empagliflozin 10 MG Tabs tablet Commonly known as: JARDIANCE Take 10 mg by mouth daily.   ferrous sulfate 325 (65 FE) MG tablet Take 325 mg by mouth daily.   fluticasone 50 MCG/ACT nasal spray Commonly known as: FLONASE Place 2 sprays into both nostrils daily.   furosemide 20 MG tablet Commonly known as: LASIX Take 20 mg by mouth daily.   latanoprost 0.005 % ophthalmic solution Commonly known as: XALATAN Place 1 drop into both eyes at bedtime.   letrozole 2.5 MG tablet Commonly known as: FEMARA TAKE 1 TABLET BY MOUTH ONCE DAILY.   melatonin 3 MG Tabs tablet Take 3 mg by mouth at bedtime as needed (Sleep).    metFORMIN 500 MG tablet Commonly known as: GLUCOPHAGE Take 500 mg by mouth 2 (two) times daily.   mirabegron ER 50 MG Tb24 tablet Commonly known as: MYRBETRIQ Take 1 tablet (50 mg total) by mouth daily.   OXYGEN Inhale 2 L into the lungs at bedtime.   pantoprazole 40 MG tablet Commonly known as: Protonix Take 1 tablet (40 mg total) by mouth 2 (two) times daily.   pregabalin 100 MG capsule Commonly known as: LYRICA Take 1 capsule (100 mg total) by mouth 3 (three) times daily.   STOOL SOFTENER PO Take 100 mg by mouth at bedtime.   traMADol 50 MG tablet Commonly known as: ULTRAM Take 50 mg by mouth 2 (two) times daily as needed.        Follow-up Information     Vaught, Creighton, MD. Schedule an appointment as soon as possible for a visit in 1 week(s).   Specialty: Otolaryngology Contact  information: [address] [place] [address] [phone]                Discharge Exam: Ceasar Mons Weights   09/03/23 1616 09/03/23 2300 09/07/23 0823  Weight: 96.2 kg 95.9 kg 95.6 kg   GEN: NAD SKIN: No rash EYES: No pallor or icterus ENT: MMM, upper lip is still swollen but less erythematous.  Facial swelling is better. CV: RRR PULM: CTA B ABD: soft, obese, NT, +BS CNS: AAO x 3, non focal EXT: No edema or tenderness   Condition at discharge: good  The results of significant diagnostics from this hospitalization (including imaging, microbiology, ancillary and laboratory) are listed below for reference.   Imaging Studies: CT Maxillofacial W Contrast  Result Date: 09/03/2023 CLINICAL DATA:  Facial swelling EXAM: CT MAXILLOFACIAL WITH CONTRAST TECHNIQUE: Multidetector CT imaging of the maxillofacial structures was performed with intravenous contrast. Multiplanar CT image reconstructions were also generated. RADIATION DOSE REDUCTION: This exam was performed according to the departmental dose-optimization program which includes automated exposure  control, adjustment of the mA and/or kV according to patient size and/or use of iterative reconstruction technique. CONTRAST:  75mL OMNIPAQUE IOHEXOL 300 MG/ML  SOLN COMPARISON:  06/24/2023 FINDINGS: Osseous: Poor dentition with multiple apical lucencies of maxilla, greatest at the root the most posterior remaining tooth on the left. Orbits: Negative. No traumatic or inflammatory finding. Sinuses: Polypoid soft tissue within the left nasal cavity and upper ethmoid sinus, unchanged since 06/24/2023 Soft tissues: Marked edema of the upper lip with inflammatory change in the subcutaneous fat of the lower face bilaterally. No abscess or drainable fluid collection. Limited intracranial: No significant or unexpected finding. IMPRESSION: 1. Marked edema of the upper lip with inflammatory change in the subcutaneous fat of the lower face bilaterally. No abscess or drainable fluid collection. 2. Poor dentition with multiple apical lucencies of maxilla, greatest at the root the most posterior remaining tooth on the left. 3. Polypoid soft tissue within the left nasal cavity and upper ethmoid sinus, unchanged since 06/24/2023. Electronically Signed   By: Deatra Robinson M.D.   On: 09/03/2023 20:27    Microbiology: Results for orders placed or performed during the hospital encounter of 09/03/23  MRSA Next Gen by PCR, Nasal     Status: Abnormal   Collection Time: 09/03/23 11:39 PM   Specimen: Nasal Mucosa; Nasal Swab  Result Value Ref Range Status   MRSA by PCR Next Gen DETECTED (A) NOT DETECTED Final    Comment: RESULT CALLED TO, READ BACK BY AND VERIFIED WITH: FRANCHESICA BELL @0210  ON 09/04/23 SKL (NOTE) The GeneXpert MRSA Assay (FDA approved for NASAL specimens only), is one component of a comprehensive MRSA colonization surveillance program. It is not intended to diagnose MRSA infection nor to guide or monitor treatment for MRSA infections. Test performance is not FDA approved in patients less than 17  years old. Performed at [facility], [address] [place], [place] [address]   Culture, blood (routine x 2)     Status: None (Preliminary result)   Collection Time: 09/04/23 12:23 AM   Specimen: BLOOD  Result Value Ref Range Status   Specimen Description BLOOD BLOOD RIGHT HAND  Final   Special Requests   Final    BOTTLES DRAWN AEROBIC AND ANAEROBIC Blood Culture adequate volume   Culture   Final    NO GROWTH 3 DAYS Performed at [facility], [address], [place], [place] [address]    Report Status PENDING  Incomplete  Culture, blood (routine x 2)     Status: None (Preliminary result)   Collection Time: 09/04/23 12:23 AM   Specimen: BLOOD  Result Value Ref Range Status   Specimen Description BLOOD BLOOD RIGHT ARM  Final   Special Requests   Final    BOTTLES DRAWN AEROBIC AND ANAEROBIC Blood Culture adequate volume   Culture   Final    NO GROWTH 3 DAYS Performed at [facility], [address] [place], [place] [address]    Report Status PENDING  Incomplete    Labs: CBC: Recent Labs  Lab 09/03/23 1732 09/04/23 0519 09/05/23 0341 09/06/23 0347 09/07/23 0348  WBC 12.2* 13.2* 14.5* 2.5* 4.1  NEUTROABS 9.7*  --  12.0* 1.5* 2.4  HGB 13.3 12.9 11.9* 12.1 11.7*  HCT 41.1 39.2 36.5 36.4 36.9  MCV 97.2 95.4 96.3 95.8 97.6  PLT 168 144* 199 164 143*   Basic Metabolic Panel: Recent Labs  Lab 09/03/23 1732 09/04/23 0519 09/05/23 0341 09/05/23 1432 09/06/23 0347 09/07/23 0348  NA 136 137 140 138  --  139  K 4.0 3.6 3.3* 4.1  --  3.8  CL 98 104 107 109  --  105  CO2 26 22 22 22   --  25  GLUCOSE 126* 157* 200* 189*  --  186*  BUN 15 16 30* 30*  --  23  CREATININE 0.56 0.48 0.50 0.54 0.50 0.35*  CALCIUM 8.9 8.8* 8.4* 8.2*  --  8.1*  MG  --   --  2.5*  --  2.3  --   PHOS  --   --  2.2*  --  2.1* 3.1   Liver Function Tests: Recent Labs  Lab 09/03/23 1732 09/04/23 0519  AST 26 41  ALT 34 48*  ALKPHOS 75 67  BILITOT 0.9 1.1   PROT 7.7 7.4  ALBUMIN 4.0 3.8   CBG: Recent Labs  Lab 09/06/23 0758 09/06/23 1149 09/06/23 1706 09/06/23 2206 09/07/23 0737  GLUCAP 145* 186* 164* 201* 146*    Discharge time spent: greater than 30 minutes.  Signed: Lurene Shadow, MD [facility] 09/07/2023

## 2023-09-07 NOTE — TOC Progression Note (Addendum)
Transition of Care [facility]) - Progression Note    Patient Details  Name: Rhonda Thornton MRN: [id number] Date of Birth: 09-16-1951  Transition of Care [facility]) CM/SW Contact  Chapman Fitch, RN Phone Number: 09/07/2023, 9:11 AM  Clinical Narrative:     Message sent to [facility] with [facility] to follow up to determine if patient is still active with services.   Per Patient her Home O2 is through [employer], and she wears it at night only, however she states that she has portable o2 tanks. Message sent to Jon with [employer] to confirm their orders on file.   Update 915 per Adelina Mings with [facility] Patient is no loner active.  Patient states that she does not feel that home health services are needed at this time  Update: Per Jon with [employer] patient home O2 orders are for nocturnal only.  MD and RN notified that if patient needs continuous O2 at discharge Qualifying sats will need to be obtained   Update:  1047 - per MD patient will not require continuous o2 at discharge  Expected Discharge Plan: Home w Home Health Services Barriers to Discharge: Continued Medical Work up  Expected Discharge Plan and Services       Living arrangements for the past 2 months: Single Family Home                             [facility] Agency: [facility] Date [facility] Agency Contacted: 09/06/23   Representative spoke with at [facility] Agency: Bjorn Loser   Social Determinants of Health (SDOH) Interventions SDOH Screenings   Food Insecurity: No Food Insecurity (09/03/2023)  Housing: Low Risk  (09/03/2023)  Transportation Needs: No Transportation Needs (09/03/2023)  Utilities: Not At Risk (09/03/2023)  Financial Resource Strain: Medium Risk (07/14/2022)   Received from [facility], [facility]  Tobacco Use: Low Risk  (09/03/2023)    Readmission Risk Interventions    02/15/2022    4:13 PM  Readmission Risk Prevention Plan  Transportation Screening Complete  PCP or Specialist Appt within 5-7 Days Complete  Home Care  Screening Complete  Medication Review (RN CM) Complete

## 2023-09-09 LAB — CULTURE, BLOOD (ROUTINE X 2)
Culture: NO GROWTH
Culture: NO GROWTH
Special Requests: ADEQUATE
Special Requests: ADEQUATE

## 2023-11-23 ENCOUNTER — Ambulatory Visit: Payer: Medicare HMO | Admitting: Oncology

## 2023-12-14 ENCOUNTER — Other Ambulatory Visit: Payer: Self-pay | Admitting: Oncology

## 2023-12-21 ENCOUNTER — Other Ambulatory Visit: Payer: Self-pay

## 2023-12-21 ENCOUNTER — Inpatient Hospital Stay: Payer: Medicare HMO | Admitting: Oncology

## 2023-12-21 ENCOUNTER — Emergency Department
Admission: EM | Admit: 2023-12-21 | Discharge: 2023-12-21 | Disposition: A | Discharge status: Home / Self Care | Payer: Medicare HMO | Attending: Emergency Medicine | Admitting: Emergency Medicine

## 2023-12-21 DIAGNOSIS — Z7901 Long term (current) use of anticoagulants: Secondary | ICD-10-CM | POA: Insufficient documentation

## 2023-12-21 DIAGNOSIS — I4891 Unspecified atrial fibrillation: Secondary | ICD-10-CM | POA: Diagnosis not present

## 2023-12-21 DIAGNOSIS — I10 Essential (primary) hypertension: Secondary | ICD-10-CM | POA: Diagnosis not present

## 2023-12-21 DIAGNOSIS — E119 Type 2 diabetes mellitus without complications: Secondary | ICD-10-CM | POA: Diagnosis not present

## 2023-12-21 DIAGNOSIS — L0231 Cutaneous abscess of buttock: Secondary | ICD-10-CM | POA: Diagnosis present

## 2023-12-21 LAB — CBC WITH DIFFERENTIAL/PLATELET
Abs Immature Granulocytes: 0.12 10*3/uL — ABNORMAL HIGH (ref 0.00–0.07)
Basophils Absolute: 0.1 10*3/uL (ref 0.0–0.1)
Basophils Relative: 1 %
Eosinophils Absolute: 0.3 10*3/uL (ref 0.0–0.5)
Eosinophils Relative: 3 %
HCT: 37.2 % (ref 36.0–46.0)
Hemoglobin: 11.5 g/dL — ABNORMAL LOW (ref 12.0–15.0)
Immature Granulocytes: 2 %
Lymphocytes Relative: 15 %
Lymphs Abs: 1.1 10*3/uL (ref 0.7–4.0)
MCH: 29.9 pg (ref 26.0–34.0)
MCHC: 30.9 g/dL (ref 30.0–36.0)
MCV: 96.9 fL (ref 80.0–100.0)
Monocytes Absolute: 0.6 10*3/uL (ref 0.1–1.0)
Monocytes Relative: 8 %
Neutro Abs: 5.3 10*3/uL (ref 1.7–7.7)
Neutrophils Relative %: 71 %
Platelets: 215 10*3/uL (ref 150–400)
RBC: 3.84 MIL/uL — ABNORMAL LOW (ref 3.87–5.11)
RDW: 14.7 % (ref 11.5–15.5)
WBC: 7.4 10*3/uL (ref 4.0–10.5)
nRBC: 0 % (ref 0.0–0.2)

## 2023-12-21 LAB — COMPREHENSIVE METABOLIC PANEL
ALT: 24 U/L (ref 0–44)
AST: 22 U/L (ref 15–41)
Albumin: 3.4 g/dL — ABNORMAL LOW (ref 3.5–5.0)
Alkaline Phosphatase: 81 U/L (ref 38–126)
Anion gap: 8 (ref 5–15)
BUN: 20 mg/dL (ref 8–23)
CO2: 26 mmol/L (ref 22–32)
Calcium: 8.8 mg/dL — ABNORMAL LOW (ref 8.9–10.3)
Chloride: 105 mmol/L (ref 98–111)
Creatinine, Ser: 0.56 mg/dL (ref 0.44–1.00)
GFR, Estimated: >60 mL/min (ref >60)
Glucose, Bld: 246 mg/dL — ABNORMAL HIGH (ref 70–99)
Potassium: 4.1 mmol/L (ref 3.5–5.1)
Sodium: 139 mmol/L (ref 135–145)
Total Bilirubin: 0.5 mg/dL (ref 0.0–1.2)
Total Protein: 7.2 g/dL (ref 6.5–8.1)

## 2023-12-21 MED ORDER — CEPHALEXIN 500 MG PO CAPS: 500.0000 mg | ORAL_CAPSULE | Freq: Four times a day (QID) | ORAL | 0 refills | Status: AC

## 2023-12-21 MED ORDER — DOXYCYCLINE HYCLATE 100 MG PO TABS: 100.0000 mg | ORAL_TABLET | Freq: Two times a day (BID) | ORAL | 0 refills | Status: AC

## 2023-12-21 MED ORDER — LIDOCAINE-EPINEPHRINE 1 %-1:100000 IJ SOLN
20.0000 mL | Freq: Once | INTRAMUSCULAR | Status: DC
  Filled 2023-12-21: qty 20
  Filled 2023-12-21: qty 1

## 2023-12-21 MED ORDER — TRAMADOL HCL 50 MG PO TABS: 50.0000 mg | ORAL_TABLET | Freq: Two times a day (BID) | ORAL | 0 refills | Status: AC

## 2023-12-21 MED ORDER — CEPHALEXIN 500 MG PO CAPS
500.0000 mg | ORAL_CAPSULE | Freq: Once | ORAL | Status: AC
  Administered 2023-12-21: 500 mg via ORAL
  Filled 2023-12-21: qty 1

## 2023-12-21 MED ORDER — DOXYCYCLINE HYCLATE 100 MG PO TABS
100.0000 mg | ORAL_TABLET | Freq: Once | ORAL | Status: AC
  Administered 2023-12-21: 100 mg via ORAL
  Filled 2023-12-21: qty 1

## 2023-12-21 NOTE — ED Provider Notes (Signed)
[facility] Emergency Department Provider Note     Event Date/Time   First MD Initiated Contact with Patient 12/21/23 1411     (approximate)   History   Wound Check   HPI  Rhonda Thornton is a 72 y.o. female with a history of DM, HTN, HLD, obesity, GERD, and a fib on Eliquis, presents to the ED for evaluation of a progressively worsening, left buttock abscess. She would note approximately 2 weeks of symptoms. She has been using OTC diaper rash cream. She has not seen her PCP or any provider for evaluation, until today. She now notes spontaneous bleeding from the area. She denies remote history of abscess or boil formation. She denies FCS.   Physical Exam   Triage Vital Signs: ED Triage Vitals  Encounter Vitals Group     BP 12/21/23 1142 (!) 107/49     Systolic BP Percentile --      Diastolic BP Percentile --      Pulse Rate 12/21/23 1142 69     Resp 12/21/23 1142 19     Temp 12/21/23 1142 98.8 F (37.1 C)     Temp Source 12/21/23 1142 Oral     SpO2 12/21/23 1142 91 %     Weight --      Height --      Head Circumference --      Peak Flow --      Pain Score 12/21/23 1145 10     Pain Loc --      Pain Education --      Exclude from Growth Chart --     Most recent vital signs: Vitals:   12/21/23 1142  BP: (!) 107/49  Pulse: 69  Resp: 19  Temp: 98.8 F (37.1 C)  SpO2: 91%    General Awake, no distress. NAD HEENT NCAT. PERRL. EOMI. No rhinorrhea. Mucous membranes are moist.  CV:  Good peripheral perfusion. RRR RESP:  Normal effort. CTA ABD:  No distention.  SKIN:  Middle Left buttock with a large, erythematous area of induration with central fluctuance and denuded skin, concerning for developing abscess, near the gluteal cleft   ED Results / Procedures / Treatments   Labs (all labs ordered are listed, but only abnormal results are displayed) Labs Reviewed  CBC WITH DIFFERENTIAL/PLATELET - Abnormal; Notable for the following  components:      Result Value   RBC 3.84 (*)    Hemoglobin 11.5 (*)    Abs Immature Granulocytes 0.12 (*)    All other components within normal limits  COMPREHENSIVE METABOLIC PANEL - Abnormal; Notable for the following components:   Glucose, Bld 246 (*)    Calcium 8.8 (*)    Albumin 3.4 (*)    All other components within normal limits     EKG   RADIOLOGY No results found.   PROCEDURES:  Critical Care performed: No  .Incision and Drainage  Date/Time: 12/21/2023 2:29 PM  Performed by: Lissa Hoard, PA-C Authorized by: Lissa Hoard, PA-C   Consent:    Consent obtained:  Verbal   Consent given by:  Patient   Risks, benefits, and alternatives were discussed: yes     Risks discussed:  Incomplete drainage, bleeding and pain Universal protocol:    Site/side marked: yes     Patient identity confirmed:  Verbally with patient Location:    Type:  Abscess   Size:  6x4   Location:  Anogenital  Anogenital location:  Gluteal cleft (L buttock) Pre-procedure details:    Skin preparation:  Povidone-iodine Sedation:    Sedation type:  None Anesthesia:    Anesthesia method:  Local infiltration   Local anesthetic:  Lidocaine 2% WITH epi Procedure type:    Complexity:  Simple Procedure details:    Ultrasound guidance: no     Needle aspiration: no     Incision types:  Single straight   Incision depth:  Subcutaneous   Wound management:  Probed and deloculated and irrigated with saline   Drainage:  Bloody   Drainage amount:  Moderate   Wound treatment:  Wound left open   Packing materials:  None Post-procedure details:    Procedure completion:  Tolerated well, no immediate complications    MEDICATIONS ORDERED IN ED: Medications  lidocaine-EPINEPHrine (XYLOCAINE W/EPI) 1 %-1:100000 (with pres) injection 20 mL (has no administration in time range)  doxycycline (VIBRA-TABS) tablet 100 mg (100 mg Oral Given 12/21/23 1448)  cephALEXin (KEFLEX) capsule  500 mg (500 mg Oral Given 12/21/23 1448)     IMPRESSION / MDM / ASSESSMENT AND PLAN / ED COURSE  I reviewed the triage vital signs and the nursing notes.                              Differential diagnosis includes, but is not limited to, abscess, cellulitis, pilonidal cyst abscess, Fournier's gangrene  Patient's presentation is most consistent with acute presentation with potential threat to life or bodily function.  Patient's diagnosis is consistent with left buttock abscess/cellulitis. Patient tolerated local I&D procedure. Patient will be discharged home with prescriptions for Doxycycline, Keflex, and Ultram. Patient is to follow up with [facility], as discussed, as needed or otherwise directed. Patient is given ED precautions to return to the ED for any worsening or new symptoms.   FINAL CLINICAL IMPRESSION(S) / ED DIAGNOSES   Final diagnoses:  Left buttock abscess     Rx / DC Orders   ED Discharge Orders          Ordered    cephALEXin (KEFLEX) 500 MG capsule  4 times daily        12/21/23 1539    doxycycline (VIBRA-TABS) 100 MG tablet  2 times daily        12/21/23 1539    traMADol (ULTRAM) 50 MG tablet  2 times daily        12/21/23 1539             Note:  This document was prepared using Dragon voice recognition software and may include unintentional dictation errors.    Lissa Hoard, PA-C 12/21/23 1954    Jene Every, MD 12/22/23 [phone]

## 2023-12-21 NOTE — Discharge Instructions (Addendum)
Take the antibiotic as directed. Keep the wound clean and covered. Apply warm compresses when possible. Follow-up with your PCP or [facility] as discussed for further wound checks.

## 2023-12-21 NOTE — ED Triage Notes (Signed)
Pt c/o wound that appeared on her buttocks 2 weeks ago. Pt has been using nystatin and desitin on it without improvement. Pt says she sits in a recliner most of the time and wears an incontinence brief. Pt reports hx of diabetes. Pt denies fever at home.

## 2024-01-15 ENCOUNTER — Inpatient Hospital Stay: Payer: Medicare HMO | Attending: Oncology | Admitting: Oncology

## 2024-01-15 ENCOUNTER — Encounter: Payer: Self-pay | Admitting: Oncology

## 2024-01-15 VITALS — BP 112/87 | HR 68 | Temp 98.3°F | Resp 18 | Wt 210.0 lb

## 2024-01-15 DIAGNOSIS — Z79811 Long term (current) use of aromatase inhibitors: Secondary | ICD-10-CM | POA: Diagnosis not present

## 2024-01-15 DIAGNOSIS — Z1732 Human epidermal growth factor receptor 2 negative status: Secondary | ICD-10-CM | POA: Diagnosis not present

## 2024-01-15 DIAGNOSIS — I4891 Unspecified atrial fibrillation: Secondary | ICD-10-CM | POA: Diagnosis not present

## 2024-01-15 DIAGNOSIS — Z170 Estrogen receptor positive status [ER+]: Secondary | ICD-10-CM | POA: Insufficient documentation

## 2024-01-15 DIAGNOSIS — Z7901 Long term (current) use of anticoagulants: Secondary | ICD-10-CM | POA: Insufficient documentation

## 2024-01-15 DIAGNOSIS — Z800 Family history of malignant neoplasm of digestive organs: Secondary | ICD-10-CM | POA: Insufficient documentation

## 2024-01-15 DIAGNOSIS — C50412 Malignant neoplasm of upper-outer quadrant of left female breast: Secondary | ICD-10-CM

## 2024-01-15 DIAGNOSIS — Z923 Personal history of irradiation: Secondary | ICD-10-CM | POA: Diagnosis not present

## 2024-01-15 DIAGNOSIS — Z1721 Progesterone receptor positive status: Secondary | ICD-10-CM | POA: Diagnosis not present

## 2024-01-15 DIAGNOSIS — M8580 Other specified disorders of bone density and structure, unspecified site: Secondary | ICD-10-CM | POA: Insufficient documentation

## 2024-01-15 DIAGNOSIS — Z79899 Other long term (current) drug therapy: Secondary | ICD-10-CM | POA: Diagnosis not present

## 2024-01-15 NOTE — Progress Notes (Signed)
Patient is doing ok, she just has some knee pain from were she needs a knee replacement but due to her heart problem they want do the surgery. No new questions or concerns for the doctor today.

## 2024-01-15 NOTE — Progress Notes (Signed)
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  Oncotype DX score is 0.  INTERVAL HISTORY: Patient returns to clinic today for routine 43-month evaluation.  She currently feels well and is asymptomatic.  She continues to tolerate letrozole without significant side effects.  She has no neurologic complaints.  She denies any recent fevers or illnesses.  She has a good appetite and denies weight loss.  She has no chest pain, shortness of breath, cough, or hemoptysis.  She denies any nausea, vomiting, constipation, or diarrhea.  She has no urinary complaints.  Patient offers no further specific complaints today.  REVIEW OF SYSTEMS:   Review of Systems  Constitutional:  Positive for malaise/fatigue. Negative for fever and weight loss.  Respiratory: Negative.  Negative for hemoptysis and shortness of breath.   Cardiovascular: Negative.  Negative for chest pain and leg swelling.  Gastrointestinal: Negative.  Negative for abdominal pain.  Genitourinary: Negative.  Negative for dysuria.  Musculoskeletal: Negative.  Negative for back pain.  Skin: Negative.  Negative for rash.  Neurological:  Positive for weakness. Negative for dizziness, focal weakness and headaches.  Psychiatric/Behavioral: Negative.  The patient is not nervous/anxious.     As per HPI. Otherwise, a complete review of systems is negative.  PAST MEDICAL HISTORY: Past Medical History:   Diagnosis Date   Anemia    Anginal pain (HCC)    Arthritis    Breast cancer (HCC)    2022 [facility]   Chronic pain syndrome    Degenerative disc disease, lumbar    Edema of left lower extremity    Gastroparesis due to secondary diabetes (HCC)    GERD (gastroesophageal reflux disease)    Heart murmur    History of seizure disorder    Hyperlipidemia    Hypertension    Invasive carcinoma of breast (HCC) 04/25/2021   LEFT; stage 1A; grade I invasive mammary carcinoma; ER/PR (+); HER2/neu (-)   Mild aortic stenosis    Mild mitral regurgitation    Mild tricuspid regurgitation    Morbid obesity (HCC)    Opioid use    therapeutic use for Dx of chronic pain syndrome   OSA (obstructive sleep apnea)    O2 at night   Personal history of radiation therapy    Recurrent falls while walking    uses rolling walker    Requires supplemental oxygen    2L/[place] at bedtime   T2DM (type 2 diabetes mellitus) (HCC)    Tubular adenoma of colon     PAST SURGICAL HISTORY: Past Surgical History:  Procedure Laterality Date   ABDOMINAL HYSTERECTOMY  1991   partial   BREAST BIOPSY Left 0505/2022   u/s bx-"X" clip-INVASIVE MAMMARY CARCINOMA WITH TUBULAR FEATURES   BREAST BIOPSY Right 05/05/2023   [place] Core Bx,-ribbon clip- path pending   BREAST BIOPSY Right 05/05/2023   [place] RT BREAST BX W LOC DEV 1ST LESION IMG BX SPEC US GUIDE 05/05/2023 [facility]   BREAST CYST ASPIRATION Right 07/16/2016   neg   BREAST LUMPECTOMY,RADIO FREQ LOCALIZER,AXILLARY SENTINEL LYMPH  NODE BIOPSY Left 05/22/2021   Procedure: BREAST LUMPECTOMY,RADIO FREQ LOCALIZER,AXILLARY SENTINEL LYMPH NODE BIOPSY;  Surgeon: Campbell Lerner, MD;  Location: [facility] ORS;  Service: General;  Laterality: Left;   CARDIOVERSION N/A 02/14/2022   Procedure: CARDIOVERSION;  Surgeon: Antonieta Iba, MD;  Location: [facility];  Service: Cardiovascular;  Laterality: N/A;  TEE and cardioversion   CHOLECYSTECTOMY     COLONOSCOPY WITH PROPOFOL N/A 07/30/2016    Procedure: COLONOSCOPY WITH PROPOFOL;  Surgeon: Christena Deem, MD;  Location: [facility] ENDOSCOPY;  Service: Endoscopy;  Laterality: N/A;   ESOPHAGOGASTRODUODENOSCOPY (EGD) WITH PROPOFOL N/A 09/09/2017   Procedure: ESOPHAGOGASTRODUODENOSCOPY (EGD) WITH PROPOFOL;  Surgeon: Toledo, Boykin Nearing, MD;  Location: [facility] ENDOSCOPY;  Service: Endoscopy;  Laterality: N/A;   EYE SURGERY Right    for pterygium   FUNCTIONAL ENDOSCOPIC SINUS SURGERY     TEE WITHOUT CARDIOVERSION N/A 02/14/2022   Procedure: TRANSESOPHAGEAL ECHOCARDIOGRAM (TEE);  Surgeon: Antonieta Iba, MD;  Location: [facility] ORS;  Service: Cardiovascular;  Laterality: N/A;   TONSILLECTOMY      FAMILY HISTORY: Family History  Problem Relation Age of Onset   Stomach cancer Mother    Colon cancer Brother    Breast cancer Neg Hx     ADVANCED DIRECTIVES (Y/N):  N  HEALTH MAINTENANCE: Social History   Tobacco Use   Smoking status: Never   Smokeless tobacco: Never  Vaping Use   Vaping status: Never Used  Substance Use Topics   Alcohol use: No   Drug use: Never     Colonoscopy:  PAP:  Bone density:  Lipid panel:  No Known Allergies  Current Outpatient Medications  Medication Sig Dispense Refill   acetaminophen (TYLENOL) 325 MG tablet Take 650 mg by mouth 2 (two) times daily.     alum & mag hydroxide-simeth (MAALOX/MYLANTA) 200-200-20 MG/5ML suspension Take 30 mLs by mouth every 6 (six) hours as needed for indigestion or heartburn.     amiodarone (PACERONE) 200 MG tablet Take 1 tablet (200 mg total) by mouth daily.     apixaban (ELIQUIS) 5 MG TABS tablet Take 1 tablet (5 mg total) by mouth 2 (two) times daily. Hold for 1 wk (Patient taking differently: Take 5 mg by mouth 2 (two) times daily.) 60 tablet 3   aspirin EC 81 MG tablet Take 81 mg by mouth at bedtime. 30 tablet 12   atorvastatin (LIPITOR) 80 MG tablet Take 80 mg by mouth daily.     calcium carbonate (TUMS - DOSED IN MG ELEMENTAL CALCIUM) 500 MG chewable tablet Chew  1,000 mg by mouth 3 (three) times daily as needed for indigestion or heartburn.     cetirizine (ZYRTEC) 10 MG tablet Take 10 mg by mouth daily.     diltiazem (CARDIZEM CD) 240 MG 24 hr capsule Take 1 capsule (240 mg total) by mouth daily.     Docusate Calcium (STOOL SOFTENER PO) Take 100 mg by mouth at bedtime.     dorzolamide-timolol (COSOPT) 22.3-6.8 MG/ML ophthalmic solution Place 1 drop into both eyes 2 (two) times daily.     doxycycline (VIBRA-TABS) 100 MG tablet Take 1 tablet (100 mg total) by mouth 2 (two) times daily. 20 tablet 0   DULoxetine (CYMBALTA) 60 MG capsule Take 60 mg by mouth daily.     empagliflozin (JARDIANCE) 10 MG TABS tablet Take 10 mg by mouth daily.     fluticasone (FLONASE) 50 MCG/ACT nasal spray Place 2 sprays into both nostrils daily.     furosemide (LASIX) 20 MG  tablet Take 20 mg by mouth daily.     latanoprost (XALATAN) 0.005 % ophthalmic solution Place 1 drop into both eyes at bedtime.     letrozole (FEMARA) 2.5 MG tablet TAKE 1 TABLET BY MOUTH ONCE DAILY. 90 tablet 0   melatonin 3 MG TABS tablet Take 3 mg by mouth at bedtime as needed (Sleep).     metFORMIN (GLUCOPHAGE) 500 MG tablet Take 500 mg by mouth 2 (two) times daily.     mirabegron ER (MYRBETRIQ) 50 MG TB24 tablet Take 1 tablet (50 mg total) by mouth daily. 30 tablet 3   Multiple Vitamin (DAILY-VITE MULTIVITAMIN) TABS Take 1 tablet by mouth daily.     OXYGEN Inhale 2 L into the lungs at bedtime.     pantoprazole (PROTONIX) 40 MG tablet Take 1 tablet (40 mg total) by mouth 2 (two) times daily.     pregabalin (LYRICA) 100 MG capsule Take 1 capsule (100 mg total) by mouth 3 (three) times daily. 5 capsule 0   ferrous sulfate 325 (65 FE) MG tablet Take 325 mg by mouth daily. (Patient not taking: Reported on 01/15/2024)     No current facility-administered medications for this visit.    OBJECTIVE: Vitals:   01/15/24 1035  BP: 112/87  Pulse: 68  Resp: 18  Temp: 98.3 F (36.8 C)  SpO2: 98%     Body  mass index is 37.2 kg/m.    ECOG FS:1 - Symptomatic but completely ambulatory  General: Well-developed, well-nourished, no acute distress.  Sitting in a wheelchair. Eyes: Pink conjunctiva, anicteric sclera. HEENT: Normocephalic, moist mucous membranes. Lungs: No audible wheezing or coughing. Heart: Regular rate and rhythm. Abdomen: Soft, nontender, no obvious distention. Musculoskeletal: No edema, cyanosis, or clubbing. Neuro: Alert, answering all questions appropriately. Cranial nerves grossly intact. Skin: No rashes or petechiae noted. Psych: Normal affect.   LAB RESULTS:  Lab Results  Component Value Date   NA 139 12/21/2023   K 4.1 12/21/2023   CL 105 12/21/2023   CO2 26 12/21/2023   GLUCOSE 246 (H) 12/21/2023   BUN 20 12/21/2023   CREATININE 0.56 12/21/2023   CALCIUM 8.8 (L) 12/21/2023   PROT 7.2 12/21/2023   ALBUMIN 3.4 (L) 12/21/2023   AST 22 12/21/2023   ALT 24 12/21/2023   ALKPHOS 81 12/21/2023   BILITOT 0.5 12/21/2023   GFRNONAA >60 12/21/2023   GFRAA >60 08/23/2017    Lab Results  Component Value Date   WBC 7.4 12/21/2023   NEUTROABS 5.3 12/21/2023   HGB 11.5 (L) 12/21/2023   HCT 37.2 12/21/2023   MCV 96.9 12/21/2023   PLT 215 12/21/2023     STUDIES: No results found.   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  Oncotype DX score 0.  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  Given her Oncotype DX score of 0, adjuvant chemotherapy was not necessary.  Patient completed adjuvant XRT on July 23, 2021.  Continue letrozole for total of 5 years completing treatment in August 2027.  Patient's most recent mammogram on Apr 27, 2023 was reported as BI-RADS 4, but subsequent right breast biopsy completed on May 05, 2023 was negative for malignancy.   Patient will require repeat mammogram in May 2025.  Return to clinic in 6 months for further evaluation. Osteopenia: Patient's most recent  bone mineral density on May 16, 2023 reported T-score of -1.7.  This is decreased from 1 year prior where her T-score was reported -1.2.  Continue calcium and vitamin D supplementation.  Repeat in May 2025 along with mammogram as above.   History of atrial fibrillation: Continue follow-up with cardiology as scheduled.   Patient expressed understanding and was in agreement with this plan. She also understands that She can call clinic at any time with any questions, concerns, or complaints.    Cancer Staging  Carcinoma of upper-outer quadrant of female breast, left [facility]) Staging form: Breast, AJCC 8th Edition - Clinical stage from 05/02/2021: Stage IA (cT1b, cN0, cM0, G1, ER+, PR+, HER2-) - Signed by Jeralyn Ruths, MD on 05/07/2021 Histologic grading system: 3 grade system   Jeralyn Ruths, MD   01/15/2024 1:21 PM

## 2024-01-19 ENCOUNTER — Other Ambulatory Visit: Payer: Self-pay | Admitting: Oncology

## 2024-01-19 DIAGNOSIS — C50412 Malignant neoplasm of upper-outer quadrant of left female breast: Secondary | ICD-10-CM

## 2024-04-28 ENCOUNTER — Other Ambulatory Visit: Payer: Medicare HMO

## 2024-05-03 ENCOUNTER — Other Ambulatory Visit: Payer: Medicare HMO

## 2024-05-09 ENCOUNTER — Other Ambulatory Visit: Payer: Self-pay | Admitting: Oncology

## 2024-06-16 ENCOUNTER — Ambulatory Visit
Admission: RE | Admit: 2024-06-16 | Discharge: 2024-06-16 | Disposition: A | Discharge status: Home / Self Care | Source: Ambulatory Visit | Attending: Oncology | Admitting: Oncology

## 2024-06-16 DIAGNOSIS — C50412 Malignant neoplasm of upper-outer quadrant of left female breast: Secondary | ICD-10-CM

## 2024-06-16 DIAGNOSIS — Z79811 Long term (current) use of aromatase inhibitors: Secondary | ICD-10-CM | POA: Diagnosis present

## 2024-07-14 ENCOUNTER — Ambulatory Visit: Payer: Medicare HMO | Admitting: Oncology

## 2024-07-22 ENCOUNTER — Telehealth: Payer: Self-pay | Admitting: Oncology

## 2024-07-22 NOTE — Telephone Encounter (Signed)
 Pt called to reschedule appt - LH

## 2024-07-25 ENCOUNTER — Inpatient Hospital Stay: Admitting: Oncology

## 2024-08-01 ENCOUNTER — Other Ambulatory Visit: Payer: Self-pay | Admitting: Oncology

## 2024-08-04 ENCOUNTER — Inpatient Hospital Stay: Attending: Oncology | Admitting: Oncology

## 2024-08-04 ENCOUNTER — Encounter: Payer: Self-pay | Admitting: Oncology

## 2024-08-04 VITALS — BP 104/91 | HR 73 | Temp 97.3°F | Resp 16 | Wt 213.0 lb

## 2024-08-04 DIAGNOSIS — Z1732 Human epidermal growth factor receptor 2 negative status: Secondary | ICD-10-CM | POA: Diagnosis not present

## 2024-08-04 DIAGNOSIS — M8580 Other specified disorders of bone density and structure, unspecified site: Secondary | ICD-10-CM | POA: Insufficient documentation

## 2024-08-04 DIAGNOSIS — Z800 Family history of malignant neoplasm of digestive organs: Secondary | ICD-10-CM | POA: Diagnosis not present

## 2024-08-04 DIAGNOSIS — C50412 Malignant neoplasm of upper-outer quadrant of left female breast: Secondary | ICD-10-CM | POA: Insufficient documentation

## 2024-08-04 DIAGNOSIS — Z170 Estrogen receptor positive status [ER+]: Secondary | ICD-10-CM | POA: Insufficient documentation

## 2024-08-04 DIAGNOSIS — Z1721 Progesterone receptor positive status: Secondary | ICD-10-CM | POA: Diagnosis not present

## 2024-08-04 DIAGNOSIS — Z79899 Other long term (current) drug therapy: Secondary | ICD-10-CM | POA: Insufficient documentation

## 2024-08-04 NOTE — Progress Notes (Signed)
 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  Oncotype DX score is 0.  INTERVAL HISTORY: Patient returns to clinic today for routine 63-month evaluation.  She continues to feel well and remains asymptomatic.  She is tolerating letrozole  without significant side effects.  She has no neurologic complaints.  She denies any recent fevers or illnesses.  She has a good appetite and denies weight loss.  She has no chest pain, shortness of breath, cough, or hemoptysis.  She denies any nausea, vomiting, constipation, or diarrhea.  She has no urinary complaints.  Patient offers no specific complaints today.  REVIEW OF SYSTEMS:   Review of Systems  Constitutional: Negative.  Negative for fever, malaise/fatigue and weight loss.  Respiratory: Negative.  Negative for hemoptysis and shortness of breath.   Cardiovascular: Negative.  Negative for chest pain and leg swelling.  Gastrointestinal: Negative.  Negative for abdominal pain.  Genitourinary: Negative.  Negative for dysuria.  Musculoskeletal: Negative.  Negative for back pain.  Skin: Negative.  Negative for rash.  Neurological: Negative.  Negative for dizziness, focal weakness, weakness and headaches.  Psychiatric/Behavioral: Negative.  The patient is not nervous/anxious.     As per HPI. Otherwise, a complete review of systems is negative.  PAST MEDICAL HISTORY: Past Medical History:  Diagnosis Date    Anemia    Anginal pain (HCC)    Arthritis    Breast cancer (HCC)    2022 [facility]   Chronic pain syndrome    Degenerative disc disease, lumbar    Edema of left lower extremity    Gastroparesis due to secondary diabetes (HCC)    GERD (gastroesophageal reflux disease)    Heart murmur    History of seizure disorder    Hyperlipidemia    Hypertension    Invasive carcinoma of breast (HCC) 04/25/2021   LEFT; stage 1A; grade I invasive mammary carcinoma; ER/PR (+); HER2/neu (-)   Mild aortic stenosis    Mild mitral regurgitation    Mild tricuspid regurgitation    Morbid obesity (HCC)    Opioid use    therapeutic use for Dx of chronic pain syndrome   OSA (obstructive sleep apnea)    O2 at night   Personal history of radiation therapy    Recurrent falls while walking    uses rolling walker    Requires supplemental oxygen    2L/[place] at bedtime   T2DM (type 2 diabetes mellitus) (HCC)    Tubular adenoma of colon     PAST SURGICAL HISTORY: Past Surgical History:  Procedure Laterality Date   ABDOMINAL HYSTERECTOMY  1991   partial   BREAST BIOPSY Left 0505/2022   u/s bx-X clip-INVASIVE MAMMARY CARCINOMA WITH TUBULAR FEATURES   BREAST BIOPSY Right 05/05/2023   Us  Core Bx,-ribbon clip- path pending   BREAST BIOPSY Right 05/05/2023   US  RT BREAST BX W LOC DEV 1ST LESION IMG BX SPEC US  GUIDE 05/05/2023 [facility]   BREAST CYST ASPIRATION Right 07/16/2016   neg   BREAST LUMPECTOMY,RADIO FREQ LOCALIZER,AXILLARY SENTINEL LYMPH NODE BIOPSY Left  05/22/2021   Procedure: BREAST LUMPECTOMY,RADIO FREQ LOCALIZER,AXILLARY SENTINEL LYMPH NODE BIOPSY;  Surgeon: Lane Shope, MD;  Location: [facility] ORS;  Service: General;  Laterality: Left;   CARDIOVERSION N/A 02/14/2022   Procedure: CARDIOVERSION;  Surgeon: Perla Evalene PARAS, MD;  Location: [facility] ORS;  Service: Cardiovascular;  Laterality: N/A;  TEE and cardioversion   CHOLECYSTECTOMY     COLONOSCOPY WITH PROPOFOL  N/A 07/30/2016   Procedure:  COLONOSCOPY WITH PROPOFOL ;  Surgeon: Gladis RAYMOND Mariner, MD;  Location: [facility] ENDOSCOPY;  Service: Endoscopy;  Laterality: N/A;   ESOPHAGOGASTRODUODENOSCOPY (EGD) WITH PROPOFOL  N/A 09/09/2017   Procedure: ESOPHAGOGASTRODUODENOSCOPY (EGD) WITH PROPOFOL ;  Surgeon: Toledo, Ladell POUR, MD;  Location: [facility] ENDOSCOPY;  Service: Endoscopy;  Laterality: N/A;   EYE SURGERY Right    for pterygium   FUNCTIONAL ENDOSCOPIC SINUS SURGERY     TEE WITHOUT CARDIOVERSION N/A 02/14/2022   Procedure: TRANSESOPHAGEAL ECHOCARDIOGRAM (TEE);  Surgeon: Perla Evalene PARAS, MD;  Location: [facility] ORS;  Service: Cardiovascular;  Laterality: N/A;   TONSILLECTOMY      FAMILY HISTORY: Family History  Problem Relation Age of Onset   Stomach cancer Mother    Colon cancer Brother    Breast cancer Neg Hx     ADVANCED DIRECTIVES (Y/N):  N  HEALTH MAINTENANCE: Social History   Tobacco Use   Smoking status: Never   Smokeless tobacco: Never  Vaping Use   Vaping status: Never Used  Substance Use Topics   Alcohol  use: No   Drug use: Never     Colonoscopy:  PAP:  Bone density:  Lipid panel:  No Known Allergies  Current Outpatient Medications  Medication Sig Dispense Refill   acetaminophen  (TYLENOL ) 325 MG tablet Take 650 mg by mouth 2 (two) times daily.     alum & mag hydroxide-simeth (MAALOX/MYLANTA) 200-200-20 MG/5ML suspension Take 30 mLs by mouth every 6 (six) hours as needed for indigestion or heartburn.     amiodarone  (PACERONE ) 200 MG tablet Take 1 tablet (200 mg total) by mouth daily.     apixaban  (ELIQUIS ) 5 MG TABS tablet Take 1 tablet (5 mg total) by mouth 2 (two) times daily. Hold for 1 wk (Patient taking differently: Take 5 mg by mouth 2 (two) times daily.) 60 tablet 3   aspirin  EC 81 MG tablet Take 81 mg by mouth at bedtime. 30 tablet 12   atorvastatin  (LIPITOR ) 80 MG tablet Take 80 mg by mouth daily.     calcium  carbonate (TUMS - DOSED IN MG ELEMENTAL CALCIUM ) 500 MG chewable tablet Chew 1,000 mg by  mouth 3 (three) times daily as needed for indigestion or heartburn.     cetirizine  (ZYRTEC ) 10 MG tablet Take 10 mg by mouth daily.     diltiazem  (CARDIZEM  CD) 240 MG 24 hr capsule Take 1 capsule (240 mg total) by mouth daily.     Docusate Calcium  (STOOL SOFTENER PO) Take 100 mg by mouth at bedtime.     dorzolamide -timolol  (COSOPT ) 22.3-6.8 MG/ML ophthalmic solution Place 1 drop into both eyes 2 (two) times daily.     doxycycline  (VIBRA -TABS) 100 MG tablet Take 1 tablet (100 mg total) by mouth 2 (two) times daily. 20 tablet 0   DULoxetine  (CYMBALTA ) 60 MG capsule Take 60 mg by mouth daily.     empagliflozin  (JARDIANCE ) 10 MG TABS tablet Take 10 mg by mouth daily.     fluticasone  (FLONASE ) 50 MCG/ACT nasal spray Place 2 sprays into both nostrils daily.     furosemide  (LASIX ) 20 MG tablet Take 20  mg by mouth daily.     latanoprost  (XALATAN ) 0.005 % ophthalmic solution Place 1 drop into both eyes at bedtime.     letrozole  (FEMARA ) 2.5 MG tablet TAKE 1 TABLET BY MOUTH ONCE DAILY. 90 tablet 0   melatonin 3 MG TABS tablet Take 3 mg by mouth at bedtime as needed (Sleep).     metFORMIN  (GLUCOPHAGE ) 500 MG tablet Take 500 mg by mouth 2 (two) times daily.     mirabegron  ER (MYRBETRIQ ) 50 MG TB24 tablet Take 1 tablet (50 mg total) by mouth daily. 30 tablet 3   Multiple Vitamin (DAILY-VITE MULTIVITAMIN) TABS Take 1 tablet by mouth daily.     OXYGEN Inhale 2 L into the lungs at bedtime.     pantoprazole  (PROTONIX ) 40 MG tablet Take 1 tablet (40 mg total) by mouth 2 (two) times daily.     pregabalin  (LYRICA ) 100 MG capsule Take 1 capsule (100 mg total) by mouth 3 (three) times daily. 5 capsule 0   ferrous sulfate  325 (65 FE) MG tablet Take 325 mg by mouth daily. (Patient not taking: Reported on 08/04/2024)     No current facility-administered medications for this visit.    OBJECTIVE: Vitals:   08/04/24 0929  BP: (!) 104/91  Pulse: 73  Resp: 16  Temp: (!) 97.3 F (36.3 C)  SpO2: 96%     Body mass  index is 37.73 kg/m.    ECOG FS:1 - Symptomatic but completely ambulatory  General: Well-developed, well-nourished, no acute distress.  Sitting in a wheelchair. Eyes: Pink conjunctiva, anicteric sclera. HEENT: Normocephalic, moist mucous membranes. Lungs: No audible wheezing or coughing. Heart: Regular rate and rhythm. Abdomen: Soft, nontender, no obvious distention. Musculoskeletal: No edema, cyanosis, or clubbing. Neuro: Alert, answering all questions appropriately. Cranial nerves grossly intact. Skin: No rashes or petechiae noted. Psych: Normal affect.  LAB RESULTS:  Lab Results  Component Value Date   NA 139 12/21/2023   K 4.1 12/21/2023   CL 105 12/21/2023   CO2 26 12/21/2023   GLUCOSE 246 (H) 12/21/2023   BUN 20 12/21/2023   CREATININE 0.56 12/21/2023   CALCIUM  8.8 (L) 12/21/2023   PROT 7.2 12/21/2023   ALBUMIN 3.4 (L) 12/21/2023   AST 22 12/21/2023   ALT 24 12/21/2023   ALKPHOS 81 12/21/2023   BILITOT 0.5 12/21/2023   GFRNONAA >60 12/21/2023   GFRAA >60 08/23/2017    Lab Results  Component Value Date   WBC 7.4 12/21/2023   NEUTROABS 5.3 12/21/2023   HGB 11.5 (L) 12/21/2023   HCT 37.2 12/21/2023   MCV 96.9 12/21/2023   PLT 215 12/21/2023     STUDIES: No results found.   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  Oncotype DX score 0.  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  Given her Oncotype DX score of 0, adjuvant chemotherapy was not necessary.  Patient completed adjuvant XRT on July 23, 2021.  Continue letrozole  for total of 5 years completing treatment in August 2027.  Patient's most recent mammogram on June 16, 2024 was reported as BI-RADS 2.  Repeat in June 2026.  Return to clinic in 6 months for routine evaluation. Osteopenia:  Patient's most recent bone mineral density on June 16, 2024 reported no significant change from 1 year prior.  Continue calcium  and vitamin  D supplementation.  Repeat in June 2026 along with mammogram as above.    History of atrial fibrillation: Continue follow-up with cardiology as scheduled.   Patient expressed understanding and was in agreement with this plan. She also understands that She can call clinic at any time with any questions, concerns, or complaints.    Cancer Staging  Carcinoma of upper-outer quadrant of female breast, left [facility]) Staging form: Breast, AJCC 8th Edition - Clinical stage from 05/02/2021: Stage IA (cT1b, cN0, cM0, G1, ER+, PR+, HER2-) - Signed by Jacobo Evalene PARAS, MD on 05/07/2021 Histologic grading system: 3 grade system   Evalene PARAS Jacobo, MD   08/05/2024 12:17 PM

## 2024-11-07 ENCOUNTER — Other Ambulatory Visit: Payer: Self-pay | Admitting: Oncology

## 2024-11-29 ENCOUNTER — Ambulatory Visit: Admitting: Podiatry

## 2024-11-29 DIAGNOSIS — M79674 Pain in right toe(s): Secondary | ICD-10-CM | POA: Diagnosis not present

## 2024-11-29 DIAGNOSIS — B351 Tinea unguium: Secondary | ICD-10-CM | POA: Diagnosis not present

## 2024-11-29 DIAGNOSIS — M79675 Pain in left toe(s): Secondary | ICD-10-CM | POA: Diagnosis not present

## 2024-11-29 NOTE — Progress Notes (Signed)
   Chief Complaint  Patient presents with   Diabetes    [place]/diabetis neuropathy. DFC. A1c is 6.5    SUBJECTIVE Patient with a history of diabetes mellitus presents to office today complaining of elongated, thickened nails that cause pain while ambulating in shoes.  Patient is unable to trim their own nails. Patient is here for further evaluation and treatment.  Past Medical History:  Diagnosis Date   Anemia    Anginal pain    Arthritis    Breast cancer (HCC)    2022 [facility]   Chronic pain syndrome    Degenerative disc disease, lumbar    Edema of left lower extremity    Gastroparesis due to secondary diabetes (HCC)    GERD (gastroesophageal reflux disease)    Heart murmur    History of seizure disorder    Hyperlipidemia    Hypertension    Invasive carcinoma of breast (HCC) 04/25/2021   LEFT; stage 1A; grade I invasive mammary carcinoma; ER/PR (+); HER2/neu (-)   Mild aortic stenosis    Mild mitral regurgitation    Mild tricuspid regurgitation    Morbid obesity (HCC)    Opioid use    therapeutic use for Dx of chronic pain syndrome   OSA (obstructive sleep apnea)    O2 at night   Personal history of radiation therapy    Recurrent falls while walking    uses rolling walker    Requires supplemental oxygen    2L/[place] at bedtime   T2DM (type 2 diabetes mellitus) (HCC)    Tubular adenoma of colon     No Known Allergies   OBJECTIVE General Patient is awake, alert, and oriented x 3 and in no acute distress. Derm Skin is dry and supple bilateral. Negative open lesions or macerations. Remaining integument unremarkable. Nails are tender, long, thickened and dystrophic with subungual debris, consistent with onychomycosis, 1-5 bilateral. No signs of infection noted. Vasc  DP and PT pedal pulses palpable bilaterally. Temperature gradient within normal limits.  Neuro Epicritic and protective threshold sensation diminished bilaterally.  Musculoskeletal Exam No symptomatic pedal  deformities noted bilateral. Muscular strength within normal limits.  ASSESSMENT 1. Diabetes Mellitus w/ peripheral neuropathy 2.  Pain due to onychomycosis of toenails bilateral  PLAN OF CARE 1. Patient evaluated today. 2. Instructed to maintain good pedal hygiene and foot care. Stressed importance of controlling blood sugar.  3. Mechanical debridement of nails 1-5 bilaterally performed using a nail nipper. Filed with dremel without incident.  4. Return to clinic in 3 mos.     Thresa EMERSON Sar, DPM [facility]  Dr. Thresa EMERSON Sar, DPM    [address] [place], [place] [address]                Office [phone]  Fax [phone]

## 2025-02-06 ENCOUNTER — Ambulatory Visit: Admitting: Oncology

## 2025-02-10 ENCOUNTER — Inpatient Hospital Stay: Admitting: Oncology

## 2025-02-27 ENCOUNTER — Ambulatory Visit: Admitting: Podiatry
